# Patient Record
Sex: Female | Born: 1935
Health system: Southern US, Community
[De-identification: ages and names within clinical notes are randomized; demographics above are authoritative.]

## PROBLEM LIST (undated history)

## (undated) DIAGNOSIS — I2510 Atherosclerotic heart disease of native coronary artery without angina pectoris: Secondary | ICD-10-CM

## (undated) DIAGNOSIS — F419 Anxiety disorder, unspecified: Secondary | ICD-10-CM

## (undated) DIAGNOSIS — K219 Gastro-esophageal reflux disease without esophagitis: Secondary | ICD-10-CM

## (undated) DIAGNOSIS — I10 Essential (primary) hypertension: Secondary | ICD-10-CM

## (undated) DIAGNOSIS — E785 Hyperlipidemia, unspecified: Secondary | ICD-10-CM

## (undated) DIAGNOSIS — F329 Major depressive disorder, single episode, unspecified: Secondary | ICD-10-CM

## (undated) DIAGNOSIS — H9190 Unspecified hearing loss, unspecified ear: Secondary | ICD-10-CM

## (undated) HISTORY — DX: Hyperlipidemia, unspecified: E78.5

## (undated) HISTORY — DX: Major depressive disorder, single episode, unspecified: F32.9

## (undated) HISTORY — DX: Anxiety disorder, unspecified: F41.9

## (undated) HISTORY — PX: JOINT REPLACEMENT: SHX530

---
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
## 1977-06-08 HISTORY — PX: ABDOMINAL HYSTERECTOMY: SHX81

## 1991-12-06 DIAGNOSIS — E785 Hyperlipidemia, unspecified: Secondary | ICD-10-CM | POA: Insufficient documentation

## 1997-02-02 DIAGNOSIS — N959 Unspecified menopausal and perimenopausal disorder: Secondary | ICD-10-CM | POA: Insufficient documentation

## 1998-03-12 DIAGNOSIS — J309 Allergic rhinitis, unspecified: Secondary | ICD-10-CM | POA: Insufficient documentation

## 2000-03-08 DIAGNOSIS — D126 Benign neoplasm of colon, unspecified: Secondary | ICD-10-CM | POA: Insufficient documentation

## 2003-06-09 HISTORY — PX: BREAST EXCISIONAL BIOPSY: SUR124

## 2004-04-14 ENCOUNTER — Ambulatory Visit: Payer: Self-pay | Admitting: General Surgery

## 2005-03-09 ENCOUNTER — Ambulatory Visit: Payer: Self-pay | Admitting: Family Medicine

## 2005-05-20 ENCOUNTER — Ambulatory Visit: Payer: Self-pay

## 2005-06-29 ENCOUNTER — Ambulatory Visit: Payer: Self-pay | Admitting: Unknown Physician Specialty

## 2005-07-08 ENCOUNTER — Ambulatory Visit: Payer: Self-pay | Admitting: Family Medicine

## 2005-07-20 ENCOUNTER — Other Ambulatory Visit: Payer: Self-pay

## 2005-07-20 ENCOUNTER — Ambulatory Visit: Payer: Self-pay | Admitting: Orthopaedic Surgery

## 2005-07-28 ENCOUNTER — Ambulatory Visit: Payer: Self-pay | Admitting: Orthopaedic Surgery

## 2005-09-24 ENCOUNTER — Ambulatory Visit: Payer: Self-pay | Admitting: Family Medicine

## 2006-06-30 ENCOUNTER — Ambulatory Visit: Payer: Self-pay | Admitting: Unknown Physician Specialty

## 2006-07-19 ENCOUNTER — Ambulatory Visit: Payer: Self-pay | Admitting: Family Medicine

## 2006-07-21 DIAGNOSIS — M1990 Unspecified osteoarthritis, unspecified site: Secondary | ICD-10-CM | POA: Insufficient documentation

## 2006-07-21 DIAGNOSIS — F4320 Adjustment disorder, unspecified: Secondary | ICD-10-CM | POA: Insufficient documentation

## 2007-01-30 IMAGING — RF DG UGI W/O KUB
1 series · 15 of 20 positions shown · non-contrast
Comparison: none

REASON FOR EXAM: GERD, chest pain
COMMENTS:

[Series 1: run · 15 of 20 slices shown]
[im 1/20]
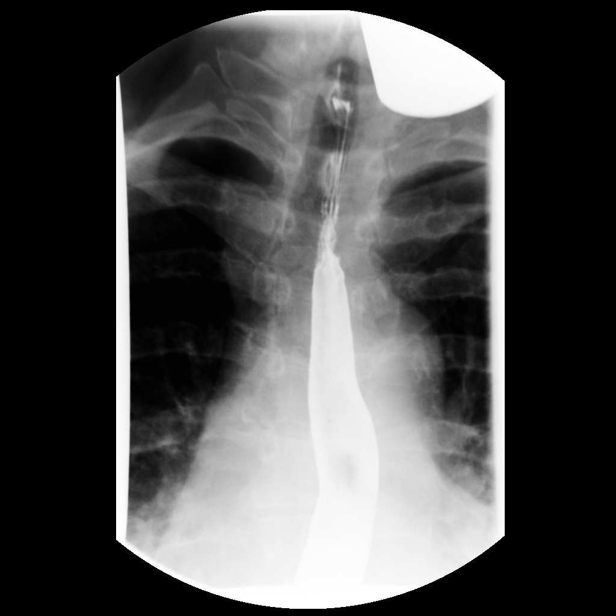
[im 3/20]
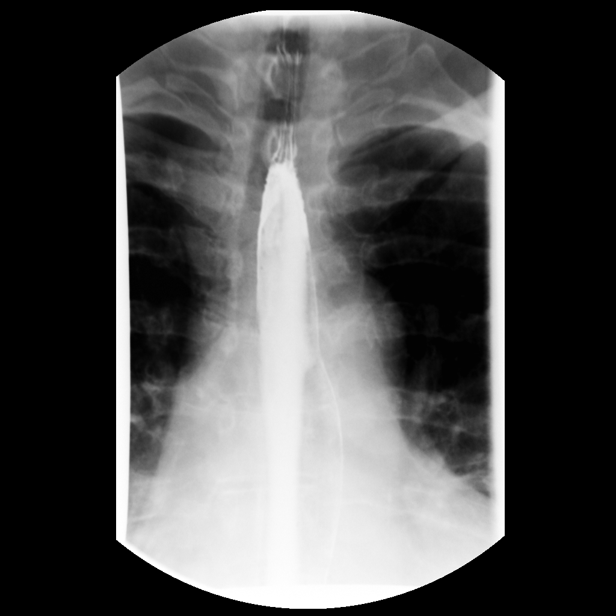
[im 4/20]
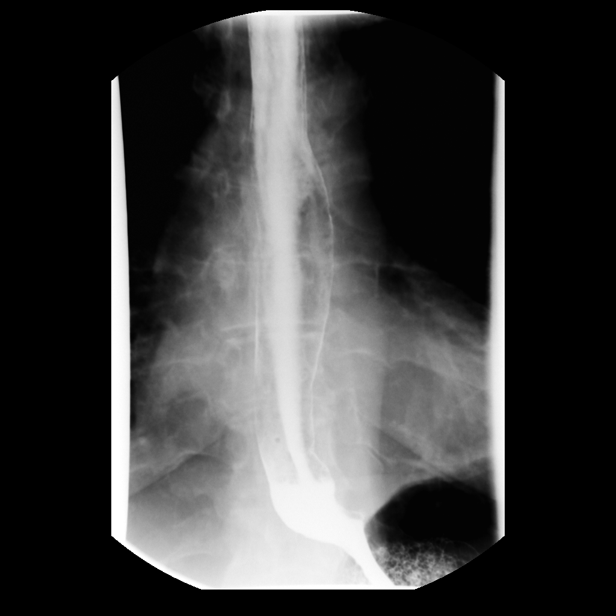
[im 5/20]
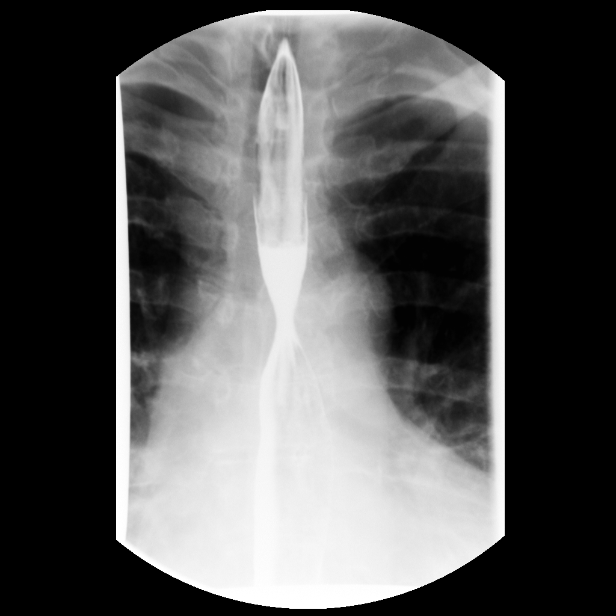
[im 7/20]
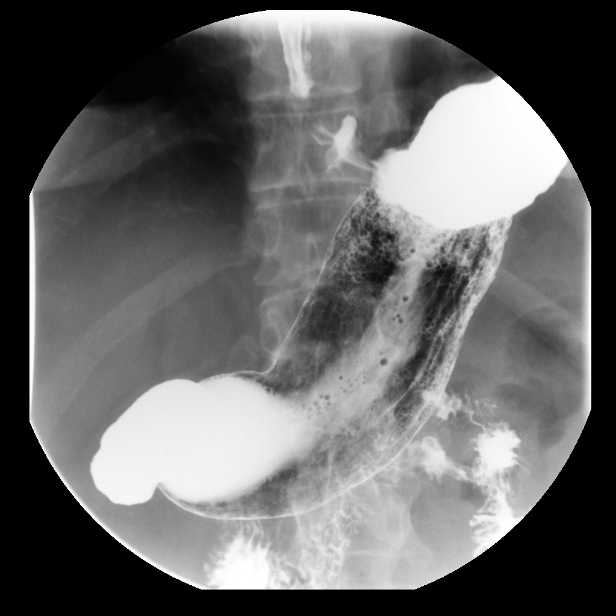
[im 8/20]
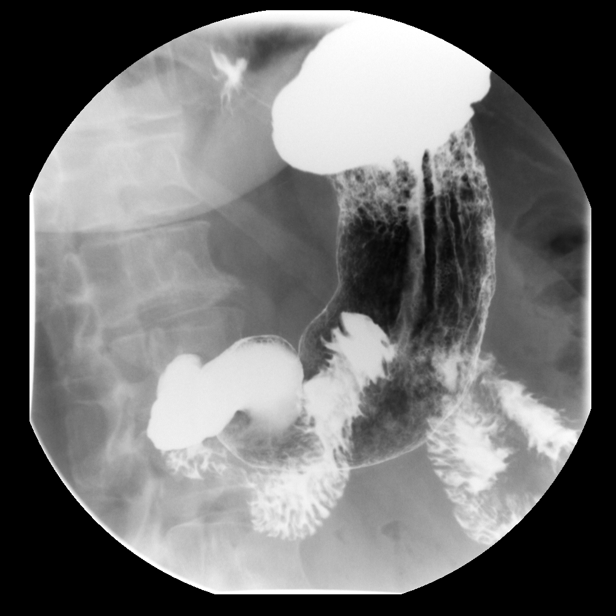
[im 9/20]
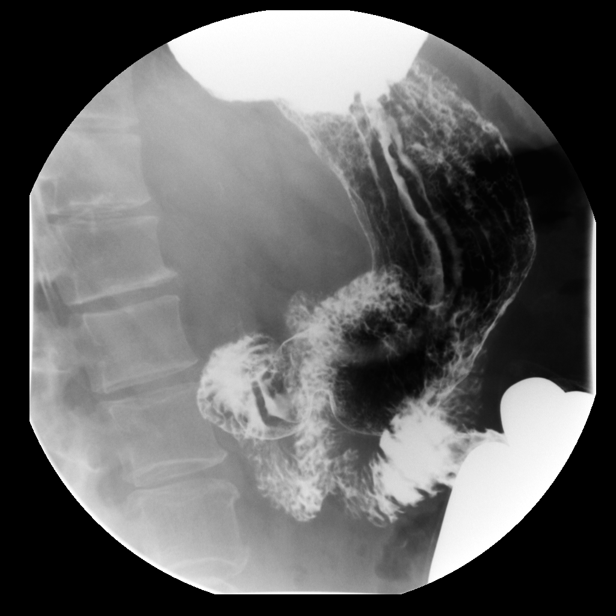
[im 11/20]
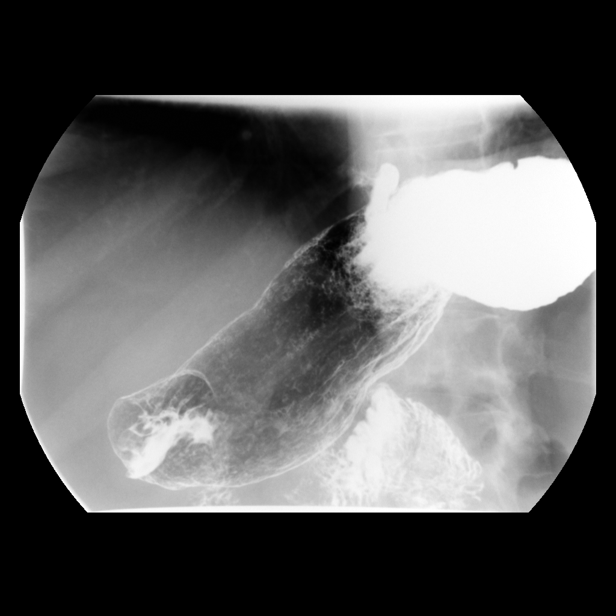
[im 12/20]
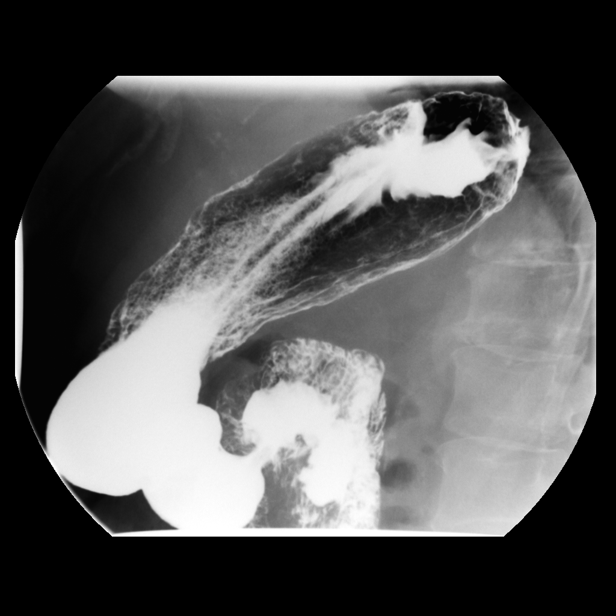
[im 13/20]
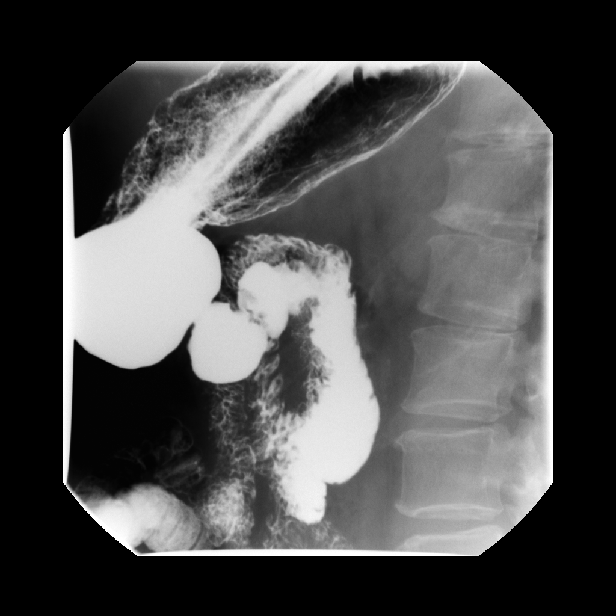
[im 15/20]
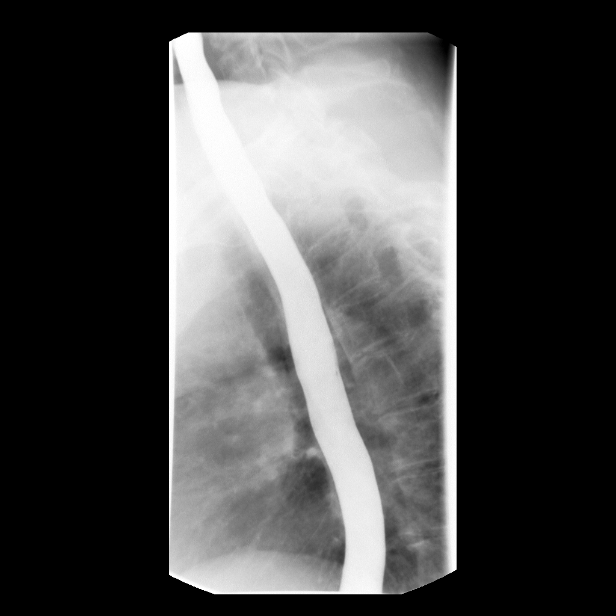
[im 16/20]
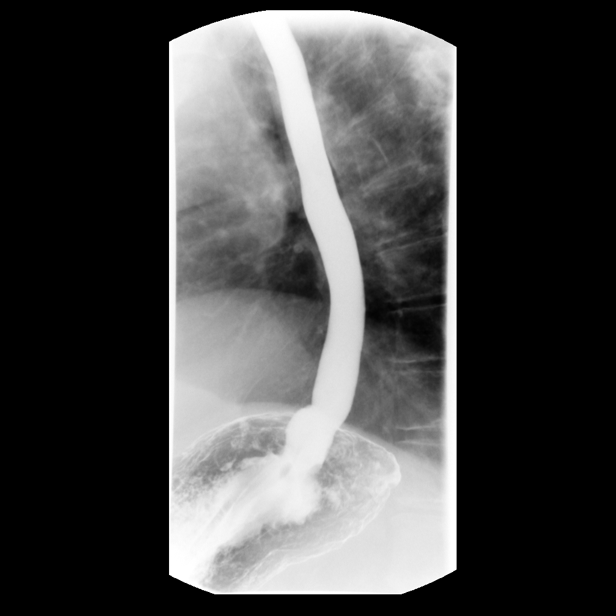
[im 17/20]
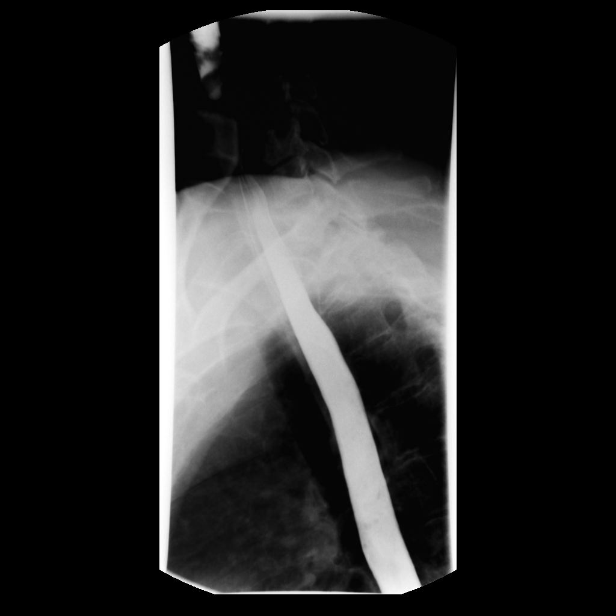
[im 19/20]
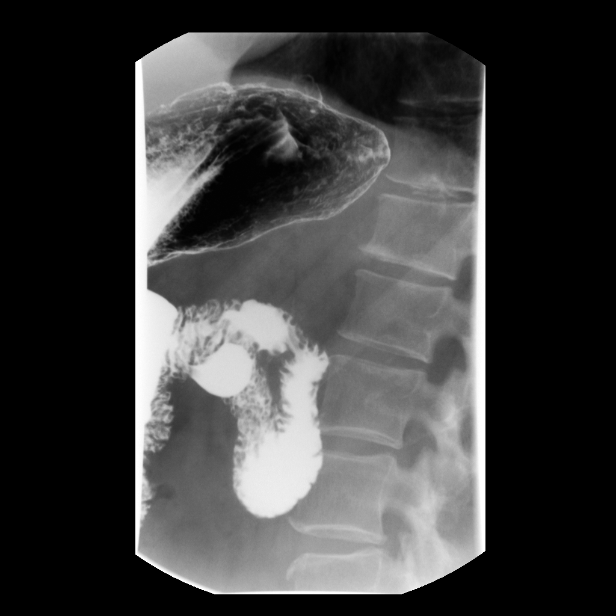
[im 20/20]
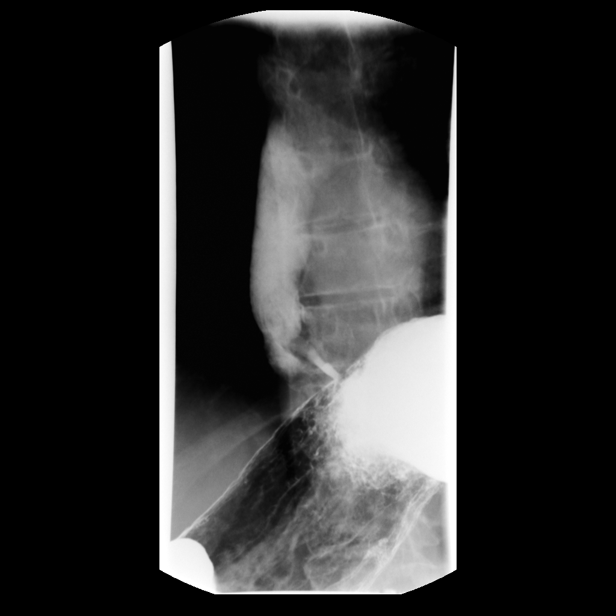

[15 of 20 positions shown; findings below may reference images not displayed]

PROCEDURE:     FL  - FL UPPER GI  - March 09, 2005  [DATE]

RESULT:     Barium passed through the esophagus unobstructed with normal
peristaltic activity. No hiatal hernia is seen. A moderate amount of
gastroesophageal reflux is elicited on the water siphon exam.

The stomach appears normal throughout its length with no ulcer craters
noted. The duodenal bulb and duodenal C-loop appear within normal limits.
IMPRESSION: 1.     Moderate amount of gastroesophageal reflux is noted without hiatal
hernia.
2.     No ulcer craters are seen.

## 2007-08-02 ENCOUNTER — Ambulatory Visit: Payer: Self-pay | Admitting: Family Medicine

## 2007-09-01 ENCOUNTER — Inpatient Hospital Stay: Payer: Self-pay | Admitting: Unknown Physician Specialty

## 2007-11-23 ENCOUNTER — Ambulatory Visit: Payer: Self-pay | Admitting: Unknown Physician Specialty

## 2008-09-26 DIAGNOSIS — R0602 Shortness of breath: Secondary | ICD-10-CM | POA: Insufficient documentation

## 2008-10-02 ENCOUNTER — Ambulatory Visit: Payer: Self-pay | Admitting: Family Medicine

## 2009-03-15 DIAGNOSIS — M791 Myalgia, unspecified site: Secondary | ICD-10-CM | POA: Insufficient documentation

## 2009-06-08 HISTORY — PX: KNEE SURGERY: SHX244

## 2009-07-29 ENCOUNTER — Ambulatory Visit: Payer: Self-pay | Admitting: General Practice

## 2009-07-29 DIAGNOSIS — N259 Disorder resulting from impaired renal tubular function, unspecified: Secondary | ICD-10-CM | POA: Insufficient documentation

## 2009-08-12 ENCOUNTER — Inpatient Hospital Stay: Payer: Self-pay | Admitting: General Practice

## 2010-09-02 ENCOUNTER — Ambulatory Visit: Payer: Self-pay | Admitting: Unknown Physician Specialty

## 2011-07-15 DIAGNOSIS — E7800 Pure hypercholesterolemia, unspecified: Secondary | ICD-10-CM | POA: Diagnosis not present

## 2011-07-15 DIAGNOSIS — R7309 Other abnormal glucose: Secondary | ICD-10-CM | POA: Diagnosis not present

## 2011-07-15 DIAGNOSIS — F329 Major depressive disorder, single episode, unspecified: Secondary | ICD-10-CM | POA: Diagnosis not present

## 2011-07-17 DIAGNOSIS — E785 Hyperlipidemia, unspecified: Secondary | ICD-10-CM | POA: Diagnosis not present

## 2011-07-17 DIAGNOSIS — R7309 Other abnormal glucose: Secondary | ICD-10-CM | POA: Diagnosis not present

## 2011-08-13 DIAGNOSIS — R0789 Other chest pain: Secondary | ICD-10-CM | POA: Diagnosis not present

## 2011-08-13 DIAGNOSIS — I10 Essential (primary) hypertension: Secondary | ICD-10-CM | POA: Diagnosis not present

## 2011-08-13 DIAGNOSIS — E782 Mixed hyperlipidemia: Secondary | ICD-10-CM | POA: Diagnosis not present

## 2011-08-13 DIAGNOSIS — R0989 Other specified symptoms and signs involving the circulatory and respiratory systems: Secondary | ICD-10-CM | POA: Diagnosis not present

## 2011-09-02 DIAGNOSIS — R413 Other amnesia: Secondary | ICD-10-CM | POA: Diagnosis not present

## 2011-09-02 DIAGNOSIS — R0989 Other specified symptoms and signs involving the circulatory and respiratory systems: Secondary | ICD-10-CM | POA: Diagnosis not present

## 2011-09-02 DIAGNOSIS — F329 Major depressive disorder, single episode, unspecified: Secondary | ICD-10-CM | POA: Diagnosis not present

## 2011-09-02 DIAGNOSIS — N3000 Acute cystitis without hematuria: Secondary | ICD-10-CM | POA: Diagnosis not present

## 2011-09-02 DIAGNOSIS — Z0000 Encounter for general adult medical examination without abnormal findings: Secondary | ICD-10-CM | POA: Diagnosis not present

## 2011-11-05 ENCOUNTER — Ambulatory Visit: Payer: Self-pay | Admitting: Family Medicine

## 2011-11-05 DIAGNOSIS — Z1231 Encounter for screening mammogram for malignant neoplasm of breast: Secondary | ICD-10-CM | POA: Diagnosis not present

## 2011-11-25 DIAGNOSIS — Z85828 Personal history of other malignant neoplasm of skin: Secondary | ICD-10-CM | POA: Diagnosis not present

## 2011-11-25 DIAGNOSIS — L570 Actinic keratosis: Secondary | ICD-10-CM | POA: Diagnosis not present

## 2011-11-25 DIAGNOSIS — D485 Neoplasm of uncertain behavior of skin: Secondary | ICD-10-CM | POA: Diagnosis not present

## 2011-11-25 DIAGNOSIS — C4441 Basal cell carcinoma of skin of scalp and neck: Secondary | ICD-10-CM | POA: Diagnosis not present

## 2011-11-25 DIAGNOSIS — L819 Disorder of pigmentation, unspecified: Secondary | ICD-10-CM | POA: Diagnosis not present

## 2011-12-02 DIAGNOSIS — E876 Hypokalemia: Secondary | ICD-10-CM | POA: Diagnosis not present

## 2011-12-02 DIAGNOSIS — F329 Major depressive disorder, single episode, unspecified: Secondary | ICD-10-CM | POA: Diagnosis not present

## 2011-12-02 DIAGNOSIS — E785 Hyperlipidemia, unspecified: Secondary | ICD-10-CM | POA: Diagnosis not present

## 2011-12-02 DIAGNOSIS — Z0000 Encounter for general adult medical examination without abnormal findings: Secondary | ICD-10-CM | POA: Diagnosis not present

## 2011-12-15 DIAGNOSIS — E785 Hyperlipidemia, unspecified: Secondary | ICD-10-CM | POA: Diagnosis not present

## 2011-12-15 DIAGNOSIS — E876 Hypokalemia: Secondary | ICD-10-CM | POA: Diagnosis not present

## 2011-12-25 DIAGNOSIS — I10 Essential (primary) hypertension: Secondary | ICD-10-CM | POA: Diagnosis not present

## 2011-12-25 DIAGNOSIS — E785 Hyperlipidemia, unspecified: Secondary | ICD-10-CM | POA: Diagnosis not present

## 2011-12-25 DIAGNOSIS — M79609 Pain in unspecified limb: Secondary | ICD-10-CM | POA: Diagnosis not present

## 2011-12-29 DIAGNOSIS — I10 Essential (primary) hypertension: Secondary | ICD-10-CM | POA: Diagnosis not present

## 2011-12-29 DIAGNOSIS — E785 Hyperlipidemia, unspecified: Secondary | ICD-10-CM | POA: Diagnosis not present

## 2011-12-29 DIAGNOSIS — M79609 Pain in unspecified limb: Secondary | ICD-10-CM | POA: Diagnosis not present

## 2011-12-29 DIAGNOSIS — M7989 Other specified soft tissue disorders: Secondary | ICD-10-CM | POA: Diagnosis not present

## 2011-12-31 DIAGNOSIS — C4441 Basal cell carcinoma of skin of scalp and neck: Secondary | ICD-10-CM | POA: Diagnosis not present

## 2012-02-09 DIAGNOSIS — I10 Essential (primary) hypertension: Secondary | ICD-10-CM | POA: Diagnosis not present

## 2012-02-09 DIAGNOSIS — R002 Palpitations: Secondary | ICD-10-CM | POA: Diagnosis not present

## 2012-02-09 DIAGNOSIS — E782 Mixed hyperlipidemia: Secondary | ICD-10-CM | POA: Diagnosis not present

## 2012-02-15 DIAGNOSIS — R002 Palpitations: Secondary | ICD-10-CM | POA: Diagnosis not present

## 2012-02-18 DIAGNOSIS — E782 Mixed hyperlipidemia: Secondary | ICD-10-CM | POA: Diagnosis not present

## 2012-02-18 DIAGNOSIS — R0789 Other chest pain: Secondary | ICD-10-CM | POA: Diagnosis not present

## 2012-02-18 DIAGNOSIS — I10 Essential (primary) hypertension: Secondary | ICD-10-CM | POA: Diagnosis not present

## 2012-02-25 DIAGNOSIS — Z23 Encounter for immunization: Secondary | ICD-10-CM | POA: Diagnosis not present

## 2012-03-04 DIAGNOSIS — R5381 Other malaise: Secondary | ICD-10-CM | POA: Diagnosis not present

## 2012-03-04 DIAGNOSIS — E785 Hyperlipidemia, unspecified: Secondary | ICD-10-CM | POA: Diagnosis not present

## 2012-03-04 DIAGNOSIS — F329 Major depressive disorder, single episode, unspecified: Secondary | ICD-10-CM | POA: Diagnosis not present

## 2012-03-04 DIAGNOSIS — Z0000 Encounter for general adult medical examination without abnormal findings: Secondary | ICD-10-CM | POA: Diagnosis not present

## 2012-03-10 DIAGNOSIS — F329 Major depressive disorder, single episode, unspecified: Secondary | ICD-10-CM | POA: Diagnosis not present

## 2012-03-10 DIAGNOSIS — R5381 Other malaise: Secondary | ICD-10-CM | POA: Diagnosis not present

## 2012-03-10 DIAGNOSIS — Z0000 Encounter for general adult medical examination without abnormal findings: Secondary | ICD-10-CM | POA: Diagnosis not present

## 2012-03-10 DIAGNOSIS — E785 Hyperlipidemia, unspecified: Secondary | ICD-10-CM | POA: Diagnosis not present

## 2012-05-25 DIAGNOSIS — F329 Major depressive disorder, single episode, unspecified: Secondary | ICD-10-CM | POA: Diagnosis not present

## 2012-06-20 DIAGNOSIS — I10 Essential (primary) hypertension: Secondary | ICD-10-CM | POA: Diagnosis not present

## 2012-06-20 DIAGNOSIS — J069 Acute upper respiratory infection, unspecified: Secondary | ICD-10-CM | POA: Diagnosis not present

## 2012-07-27 DIAGNOSIS — R05 Cough: Secondary | ICD-10-CM | POA: Diagnosis not present

## 2012-07-27 DIAGNOSIS — J309 Allergic rhinitis, unspecified: Secondary | ICD-10-CM | POA: Diagnosis not present

## 2012-07-27 DIAGNOSIS — K219 Gastro-esophageal reflux disease without esophagitis: Secondary | ICD-10-CM | POA: Diagnosis not present

## 2012-08-11 DIAGNOSIS — R0609 Other forms of dyspnea: Secondary | ICD-10-CM | POA: Diagnosis not present

## 2012-08-11 DIAGNOSIS — R0989 Other specified symptoms and signs involving the circulatory and respiratory systems: Secondary | ICD-10-CM | POA: Diagnosis not present

## 2012-08-11 DIAGNOSIS — R0789 Other chest pain: Secondary | ICD-10-CM | POA: Diagnosis not present

## 2012-08-11 DIAGNOSIS — I10 Essential (primary) hypertension: Secondary | ICD-10-CM | POA: Diagnosis not present

## 2012-08-25 DIAGNOSIS — R05 Cough: Secondary | ICD-10-CM | POA: Diagnosis not present

## 2012-08-25 DIAGNOSIS — R7309 Other abnormal glucose: Secondary | ICD-10-CM | POA: Diagnosis not present

## 2012-08-25 DIAGNOSIS — F329 Major depressive disorder, single episode, unspecified: Secondary | ICD-10-CM | POA: Diagnosis not present

## 2012-08-25 DIAGNOSIS — I10 Essential (primary) hypertension: Secondary | ICD-10-CM | POA: Diagnosis not present

## 2012-08-31 DIAGNOSIS — H4010X0 Unspecified open-angle glaucoma, stage unspecified: Secondary | ICD-10-CM | POA: Diagnosis not present

## 2012-09-02 DIAGNOSIS — R0789 Other chest pain: Secondary | ICD-10-CM | POA: Diagnosis not present

## 2012-09-02 DIAGNOSIS — R9430 Abnormal result of cardiovascular function study, unspecified: Secondary | ICD-10-CM | POA: Diagnosis not present

## 2012-09-02 DIAGNOSIS — I10 Essential (primary) hypertension: Secondary | ICD-10-CM | POA: Diagnosis not present

## 2012-09-07 ENCOUNTER — Ambulatory Visit: Payer: Self-pay | Admitting: Cardiology

## 2012-09-07 DIAGNOSIS — Z888 Allergy status to other drugs, medicaments and biological substances status: Secondary | ICD-10-CM | POA: Diagnosis not present

## 2012-09-07 DIAGNOSIS — I10 Essential (primary) hypertension: Secondary | ICD-10-CM | POA: Diagnosis not present

## 2012-09-07 DIAGNOSIS — E785 Hyperlipidemia, unspecified: Secondary | ICD-10-CM | POA: Diagnosis not present

## 2012-09-07 DIAGNOSIS — I2510 Atherosclerotic heart disease of native coronary artery without angina pectoris: Secondary | ICD-10-CM | POA: Diagnosis not present

## 2012-09-07 DIAGNOSIS — Z885 Allergy status to narcotic agent status: Secondary | ICD-10-CM | POA: Diagnosis not present

## 2012-09-07 DIAGNOSIS — Z79899 Other long term (current) drug therapy: Secondary | ICD-10-CM | POA: Diagnosis not present

## 2012-09-07 DIAGNOSIS — R0789 Other chest pain: Secondary | ICD-10-CM | POA: Diagnosis not present

## 2012-09-07 DIAGNOSIS — R079 Chest pain, unspecified: Secondary | ICD-10-CM | POA: Diagnosis not present

## 2012-09-07 DIAGNOSIS — Z8673 Personal history of transient ischemic attack (TIA), and cerebral infarction without residual deficits: Secondary | ICD-10-CM | POA: Diagnosis not present

## 2012-09-07 DIAGNOSIS — R9430 Abnormal result of cardiovascular function study, unspecified: Secondary | ICD-10-CM | POA: Diagnosis not present

## 2012-09-09 DIAGNOSIS — H4010X0 Unspecified open-angle glaucoma, stage unspecified: Secondary | ICD-10-CM | POA: Diagnosis not present

## 2012-09-14 DIAGNOSIS — I2510 Atherosclerotic heart disease of native coronary artery without angina pectoris: Secondary | ICD-10-CM | POA: Diagnosis not present

## 2012-09-14 DIAGNOSIS — I10 Essential (primary) hypertension: Secondary | ICD-10-CM | POA: Diagnosis not present

## 2012-09-20 DIAGNOSIS — I10 Essential (primary) hypertension: Secondary | ICD-10-CM | POA: Diagnosis not present

## 2012-09-27 DIAGNOSIS — H4010X0 Unspecified open-angle glaucoma, stage unspecified: Secondary | ICD-10-CM | POA: Diagnosis not present

## 2012-11-08 DIAGNOSIS — H4010X0 Unspecified open-angle glaucoma, stage unspecified: Secondary | ICD-10-CM | POA: Diagnosis not present

## 2012-11-09 ENCOUNTER — Ambulatory Visit: Payer: Self-pay | Admitting: Family Medicine

## 2012-11-09 DIAGNOSIS — Z1231 Encounter for screening mammogram for malignant neoplasm of breast: Secondary | ICD-10-CM | POA: Diagnosis not present

## 2012-11-09 DIAGNOSIS — R920 Mammographic microcalcification found on diagnostic imaging of breast: Secondary | ICD-10-CM | POA: Diagnosis not present

## 2012-11-09 LAB — HM MAMMOGRAPHY

## 2012-11-16 DIAGNOSIS — D239 Other benign neoplasm of skin, unspecified: Secondary | ICD-10-CM | POA: Diagnosis not present

## 2012-11-16 DIAGNOSIS — L819 Disorder of pigmentation, unspecified: Secondary | ICD-10-CM | POA: Diagnosis not present

## 2012-11-16 DIAGNOSIS — L739 Follicular disorder, unspecified: Secondary | ICD-10-CM | POA: Diagnosis not present

## 2012-11-16 DIAGNOSIS — D485 Neoplasm of uncertain behavior of skin: Secondary | ICD-10-CM | POA: Diagnosis not present

## 2012-11-16 DIAGNOSIS — L820 Inflamed seborrheic keratosis: Secondary | ICD-10-CM | POA: Diagnosis not present

## 2012-11-16 DIAGNOSIS — Z85828 Personal history of other malignant neoplasm of skin: Secondary | ICD-10-CM | POA: Diagnosis not present

## 2012-11-22 DIAGNOSIS — H4010X0 Unspecified open-angle glaucoma, stage unspecified: Secondary | ICD-10-CM | POA: Diagnosis not present

## 2013-01-12 ENCOUNTER — Emergency Department: Payer: Self-pay | Admitting: Emergency Medicine

## 2013-01-12 DIAGNOSIS — F411 Generalized anxiety disorder: Secondary | ICD-10-CM | POA: Diagnosis not present

## 2013-01-12 DIAGNOSIS — R110 Nausea: Secondary | ICD-10-CM | POA: Diagnosis not present

## 2013-01-12 DIAGNOSIS — I10 Essential (primary) hypertension: Secondary | ICD-10-CM | POA: Diagnosis not present

## 2013-01-12 DIAGNOSIS — K219 Gastro-esophageal reflux disease without esophagitis: Secondary | ICD-10-CM | POA: Diagnosis not present

## 2013-01-12 DIAGNOSIS — G4700 Insomnia, unspecified: Secondary | ICD-10-CM | POA: Diagnosis not present

## 2013-01-12 DIAGNOSIS — Z9889 Other specified postprocedural states: Secondary | ICD-10-CM | POA: Diagnosis not present

## 2013-01-12 DIAGNOSIS — F329 Major depressive disorder, single episode, unspecified: Secondary | ICD-10-CM | POA: Diagnosis not present

## 2013-01-12 DIAGNOSIS — E785 Hyperlipidemia, unspecified: Secondary | ICD-10-CM | POA: Diagnosis not present

## 2013-01-12 DIAGNOSIS — R0789 Other chest pain: Secondary | ICD-10-CM | POA: Diagnosis not present

## 2013-01-12 DIAGNOSIS — R079 Chest pain, unspecified: Secondary | ICD-10-CM | POA: Diagnosis not present

## 2013-01-12 LAB — COMPREHENSIVE METABOLIC PANEL
Albumin: 3.7 g/dL (ref 3.4–5.0)
Alkaline Phosphatase: 79 U/L (ref 50–136)
BUN: 20 mg/dL — ABNORMAL HIGH (ref 7–18)
Bilirubin,Total: 0.4 mg/dL (ref 0.2–1.0)
Calcium, Total: 9.7 mg/dL (ref 8.5–10.1)
Co2: 32 mmol/L (ref 21–32)
Creatinine: 0.9 mg/dL (ref 0.60–1.30)
EGFR (Non-African Amer.): >60
Glucose: 108 mg/dL — ABNORMAL HIGH (ref 65–99)
Osmolality: 284 (ref 275–301)
SGOT(AST): 29 U/L (ref 15–37)
SGPT (ALT): 35 U/L (ref 12–78)

## 2013-01-12 LAB — CBC
HCT: 39.8 % (ref 35.0–47.0)
MCHC: 36.9 g/dL — ABNORMAL HIGH (ref 32.0–36.0)
MCV: 87 fL (ref 80–100)
Platelet: 184 10*3/uL (ref 150–440)
RDW: 13.2 % (ref 11.5–14.5)
WBC: 5.1 10*3/uL (ref 3.6–11.0)

## 2013-01-12 LAB — TROPONIN I: Troponin-I: <0.02 ng/mL

## 2013-01-12 LAB — URINALYSIS, COMPLETE
Bacteria: NONE SEEN
Blood: NEGATIVE
Glucose,UR: NEGATIVE mg/dL (ref 0–75)
Nitrite: NEGATIVE
Ph: 6 (ref 4.5–8.0)
RBC,UR: 1 /HPF (ref 0–5)
Squamous Epithelial: 1

## 2013-01-12 LAB — CK TOTAL AND CKMB (NOT AT ARMC): CK-MB: 3.1 ng/mL (ref 0.5–3.6)

## 2013-01-13 DIAGNOSIS — R110 Nausea: Secondary | ICD-10-CM | POA: Diagnosis not present

## 2013-01-13 DIAGNOSIS — I2510 Atherosclerotic heart disease of native coronary artery without angina pectoris: Secondary | ICD-10-CM | POA: Diagnosis not present

## 2013-01-13 DIAGNOSIS — I10 Essential (primary) hypertension: Secondary | ICD-10-CM | POA: Diagnosis not present

## 2013-01-13 DIAGNOSIS — R0789 Other chest pain: Secondary | ICD-10-CM | POA: Diagnosis not present

## 2013-01-17 DIAGNOSIS — M5380 Other specified dorsopathies, site unspecified: Secondary | ICD-10-CM | POA: Diagnosis not present

## 2013-01-17 DIAGNOSIS — M999 Biomechanical lesion, unspecified: Secondary | ICD-10-CM | POA: Diagnosis not present

## 2013-01-17 DIAGNOSIS — M533 Sacrococcygeal disorders, not elsewhere classified: Secondary | ICD-10-CM | POA: Diagnosis not present

## 2013-01-26 DIAGNOSIS — M5380 Other specified dorsopathies, site unspecified: Secondary | ICD-10-CM | POA: Diagnosis not present

## 2013-01-26 DIAGNOSIS — M533 Sacrococcygeal disorders, not elsewhere classified: Secondary | ICD-10-CM | POA: Diagnosis not present

## 2013-01-26 DIAGNOSIS — M999 Biomechanical lesion, unspecified: Secondary | ICD-10-CM | POA: Diagnosis not present

## 2013-02-20 DIAGNOSIS — Z23 Encounter for immunization: Secondary | ICD-10-CM | POA: Diagnosis not present

## 2013-02-21 DIAGNOSIS — M533 Sacrococcygeal disorders, not elsewhere classified: Secondary | ICD-10-CM | POA: Diagnosis not present

## 2013-02-21 DIAGNOSIS — M999 Biomechanical lesion, unspecified: Secondary | ICD-10-CM | POA: Diagnosis not present

## 2013-02-21 DIAGNOSIS — M5380 Other specified dorsopathies, site unspecified: Secondary | ICD-10-CM | POA: Diagnosis not present

## 2013-04-03 DIAGNOSIS — R0609 Other forms of dyspnea: Secondary | ICD-10-CM | POA: Diagnosis not present

## 2013-04-03 DIAGNOSIS — I10 Essential (primary) hypertension: Secondary | ICD-10-CM | POA: Diagnosis not present

## 2013-04-03 DIAGNOSIS — R0789 Other chest pain: Secondary | ICD-10-CM | POA: Diagnosis not present

## 2013-04-03 DIAGNOSIS — I2510 Atherosclerotic heart disease of native coronary artery without angina pectoris: Secondary | ICD-10-CM | POA: Diagnosis not present

## 2013-06-06 DIAGNOSIS — R05 Cough: Secondary | ICD-10-CM | POA: Diagnosis not present

## 2013-06-06 DIAGNOSIS — R7309 Other abnormal glucose: Secondary | ICD-10-CM | POA: Diagnosis not present

## 2013-06-06 DIAGNOSIS — F329 Major depressive disorder, single episode, unspecified: Secondary | ICD-10-CM | POA: Diagnosis not present

## 2013-06-06 DIAGNOSIS — I10 Essential (primary) hypertension: Secondary | ICD-10-CM | POA: Diagnosis not present

## 2013-06-16 DIAGNOSIS — M5430 Sciatica, unspecified side: Secondary | ICD-10-CM | POA: Diagnosis not present

## 2013-06-16 DIAGNOSIS — IMO0002 Reserved for concepts with insufficient information to code with codable children: Secondary | ICD-10-CM | POA: Diagnosis not present

## 2013-06-28 DIAGNOSIS — L821 Other seborrheic keratosis: Secondary | ICD-10-CM | POA: Diagnosis not present

## 2013-06-28 DIAGNOSIS — Z85828 Personal history of other malignant neoplasm of skin: Secondary | ICD-10-CM | POA: Diagnosis not present

## 2013-06-28 DIAGNOSIS — L819 Disorder of pigmentation, unspecified: Secondary | ICD-10-CM | POA: Diagnosis not present

## 2013-06-28 DIAGNOSIS — L738 Other specified follicular disorders: Secondary | ICD-10-CM | POA: Diagnosis not present

## 2013-07-21 DIAGNOSIS — R209 Unspecified disturbances of skin sensation: Secondary | ICD-10-CM | POA: Diagnosis not present

## 2013-07-21 DIAGNOSIS — I10 Essential (primary) hypertension: Secondary | ICD-10-CM | POA: Diagnosis not present

## 2013-07-21 DIAGNOSIS — R252 Cramp and spasm: Secondary | ICD-10-CM | POA: Diagnosis not present

## 2013-07-21 DIAGNOSIS — R7309 Other abnormal glucose: Secondary | ICD-10-CM | POA: Diagnosis not present

## 2013-08-08 DIAGNOSIS — R7309 Other abnormal glucose: Secondary | ICD-10-CM | POA: Diagnosis not present

## 2013-08-08 DIAGNOSIS — I739 Peripheral vascular disease, unspecified: Secondary | ICD-10-CM | POA: Diagnosis not present

## 2013-08-08 DIAGNOSIS — R209 Unspecified disturbances of skin sensation: Secondary | ICD-10-CM | POA: Diagnosis not present

## 2013-08-08 DIAGNOSIS — I10 Essential (primary) hypertension: Secondary | ICD-10-CM | POA: Diagnosis not present

## 2013-09-29 DIAGNOSIS — H4010X0 Unspecified open-angle glaucoma, stage unspecified: Secondary | ICD-10-CM | POA: Diagnosis not present

## 2013-10-03 DIAGNOSIS — I10 Essential (primary) hypertension: Secondary | ICD-10-CM | POA: Diagnosis not present

## 2013-10-03 DIAGNOSIS — I2510 Atherosclerotic heart disease of native coronary artery without angina pectoris: Secondary | ICD-10-CM | POA: Diagnosis not present

## 2013-10-04 DIAGNOSIS — H4010X0 Unspecified open-angle glaucoma, stage unspecified: Secondary | ICD-10-CM | POA: Diagnosis not present

## 2013-10-04 DIAGNOSIS — H353290 Exudative age-related macular degeneration, unspecified eye, stage unspecified: Secondary | ICD-10-CM | POA: Diagnosis not present

## 2013-11-15 DIAGNOSIS — H353290 Exudative age-related macular degeneration, unspecified eye, stage unspecified: Secondary | ICD-10-CM | POA: Diagnosis not present

## 2013-12-25 DIAGNOSIS — H353290 Exudative age-related macular degeneration, unspecified eye, stage unspecified: Secondary | ICD-10-CM | POA: Diagnosis not present

## 2014-01-18 DIAGNOSIS — H353290 Exudative age-related macular degeneration, unspecified eye, stage unspecified: Secondary | ICD-10-CM | POA: Diagnosis not present

## 2014-01-24 DIAGNOSIS — Z23 Encounter for immunization: Secondary | ICD-10-CM | POA: Diagnosis not present

## 2014-02-09 DIAGNOSIS — Z8719 Personal history of other diseases of the digestive system: Secondary | ICD-10-CM | POA: Diagnosis not present

## 2014-02-09 DIAGNOSIS — K624 Stenosis of anus and rectum: Secondary | ICD-10-CM | POA: Diagnosis not present

## 2014-02-09 DIAGNOSIS — Z86010 Personal history of colonic polyps: Secondary | ICD-10-CM | POA: Diagnosis not present

## 2014-02-16 DIAGNOSIS — Z86010 Personal history of colonic polyps: Secondary | ICD-10-CM | POA: Diagnosis not present

## 2014-02-16 DIAGNOSIS — Z8719 Personal history of other diseases of the digestive system: Secondary | ICD-10-CM | POA: Diagnosis not present

## 2014-02-27 DIAGNOSIS — Z23 Encounter for immunization: Secondary | ICD-10-CM | POA: Diagnosis not present

## 2014-03-08 ENCOUNTER — Ambulatory Visit: Payer: Self-pay | Admitting: Unknown Physician Specialty

## 2014-03-08 DIAGNOSIS — Z86010 Personal history of colonic polyps: Secondary | ICD-10-CM | POA: Diagnosis not present

## 2014-03-08 DIAGNOSIS — K921 Melena: Secondary | ICD-10-CM | POA: Diagnosis not present

## 2014-03-08 DIAGNOSIS — Z09 Encounter for follow-up examination after completed treatment for conditions other than malignant neoplasm: Secondary | ICD-10-CM | POA: Diagnosis not present

## 2014-03-08 DIAGNOSIS — D123 Benign neoplasm of transverse colon: Secondary | ICD-10-CM | POA: Diagnosis not present

## 2014-03-08 DIAGNOSIS — D122 Benign neoplasm of ascending colon: Secondary | ICD-10-CM | POA: Diagnosis not present

## 2014-03-08 DIAGNOSIS — I10 Essential (primary) hypertension: Secondary | ICD-10-CM | POA: Diagnosis not present

## 2014-03-08 DIAGNOSIS — F329 Major depressive disorder, single episode, unspecified: Secondary | ICD-10-CM | POA: Diagnosis not present

## 2014-03-08 DIAGNOSIS — K2950 Unspecified chronic gastritis without bleeding: Secondary | ICD-10-CM | POA: Diagnosis not present

## 2014-03-08 DIAGNOSIS — K6389 Other specified diseases of intestine: Secondary | ICD-10-CM | POA: Diagnosis not present

## 2014-03-08 DIAGNOSIS — K3189 Other diseases of stomach and duodenum: Secondary | ICD-10-CM | POA: Diagnosis not present

## 2014-03-08 DIAGNOSIS — D139 Benign neoplasm of ill-defined sites within the digestive system: Secondary | ICD-10-CM | POA: Diagnosis not present

## 2014-03-08 DIAGNOSIS — K2940 Chronic atrophic gastritis without bleeding: Secondary | ICD-10-CM | POA: Diagnosis not present

## 2014-03-08 DIAGNOSIS — K2970 Gastritis, unspecified, without bleeding: Secondary | ICD-10-CM | POA: Diagnosis not present

## 2014-03-08 DIAGNOSIS — Z1211 Encounter for screening for malignant neoplasm of colon: Secondary | ICD-10-CM | POA: Diagnosis not present

## 2014-03-08 DIAGNOSIS — M1990 Unspecified osteoarthritis, unspecified site: Secondary | ICD-10-CM | POA: Diagnosis not present

## 2014-03-08 DIAGNOSIS — Z79899 Other long term (current) drug therapy: Secondary | ICD-10-CM | POA: Diagnosis not present

## 2014-03-08 DIAGNOSIS — D120 Benign neoplasm of cecum: Secondary | ICD-10-CM | POA: Diagnosis not present

## 2014-03-08 DIAGNOSIS — Z9889 Other specified postprocedural states: Secondary | ICD-10-CM | POA: Insufficient documentation

## 2014-03-08 DIAGNOSIS — Z96659 Presence of unspecified artificial knee joint: Secondary | ICD-10-CM | POA: Diagnosis not present

## 2014-03-08 DIAGNOSIS — D125 Benign neoplasm of sigmoid colon: Secondary | ICD-10-CM | POA: Diagnosis not present

## 2014-03-08 DIAGNOSIS — K449 Diaphragmatic hernia without obstruction or gangrene: Secondary | ICD-10-CM | POA: Diagnosis not present

## 2014-03-08 DIAGNOSIS — D124 Benign neoplasm of descending colon: Secondary | ICD-10-CM | POA: Diagnosis not present

## 2014-03-08 LAB — HM COLONOSCOPY

## 2014-03-12 DIAGNOSIS — Z9889 Other specified postprocedural states: Secondary | ICD-10-CM | POA: Diagnosis not present

## 2014-03-12 DIAGNOSIS — E780 Pure hypercholesterolemia: Secondary | ICD-10-CM | POA: Diagnosis not present

## 2014-03-12 DIAGNOSIS — I10 Essential (primary) hypertension: Secondary | ICD-10-CM | POA: Diagnosis not present

## 2014-03-13 LAB — PATHOLOGY REPORT

## 2014-03-27 DIAGNOSIS — H3532 Exudative age-related macular degeneration: Secondary | ICD-10-CM | POA: Diagnosis not present

## 2014-04-06 DIAGNOSIS — H3532 Exudative age-related macular degeneration: Secondary | ICD-10-CM | POA: Diagnosis not present

## 2014-04-10 DIAGNOSIS — H4010X1 Unspecified open-angle glaucoma, mild stage: Secondary | ICD-10-CM | POA: Diagnosis not present

## 2014-04-11 DIAGNOSIS — H3532 Exudative age-related macular degeneration: Secondary | ICD-10-CM | POA: Diagnosis not present

## 2014-04-26 DIAGNOSIS — H4010X1 Unspecified open-angle glaucoma, mild stage: Secondary | ICD-10-CM | POA: Diagnosis not present

## 2014-05-20 LAB — CBC AND DIFFERENTIAL
HCT: 41 % (ref 36–46)
HEMOGLOBIN: 14.6 g/dL (ref 12.0–16.0)
Platelets: 225 10*3/uL (ref 150–399)
WBC: 3.9 10^3/mL

## 2014-05-20 LAB — BASIC METABOLIC PANEL
BUN: 22 mg/dL — AB (ref 4–21)
Creatinine: 0.9 mg/dL (ref 0.5–1.1)
Glucose: 80 mg/dL
POTASSIUM: 4 mmol/L (ref 3.4–5.3)
SODIUM: 141 mmol/L (ref 137–147)

## 2014-05-20 LAB — HEPATIC FUNCTION PANEL
ALT: 20 U/L (ref 7–35)
AST: 22 U/L (ref 13–35)

## 2014-05-30 DIAGNOSIS — G4700 Insomnia, unspecified: Secondary | ICD-10-CM | POA: Diagnosis not present

## 2014-05-30 DIAGNOSIS — R739 Hyperglycemia, unspecified: Secondary | ICD-10-CM | POA: Diagnosis not present

## 2014-05-30 DIAGNOSIS — Z1389 Encounter for screening for other disorder: Secondary | ICD-10-CM | POA: Diagnosis not present

## 2014-05-30 DIAGNOSIS — Z9181 History of falling: Secondary | ICD-10-CM | POA: Diagnosis not present

## 2014-05-30 DIAGNOSIS — I10 Essential (primary) hypertension: Secondary | ICD-10-CM | POA: Diagnosis not present

## 2014-05-30 LAB — HEMOGLOBIN A1C: HEMOGLOBIN A1C: 5.5 % (ref 4.0–6.0)

## 2014-06-02 ENCOUNTER — Emergency Department: Payer: Self-pay | Admitting: Emergency Medicine

## 2014-06-02 DIAGNOSIS — Z79899 Other long term (current) drug therapy: Secondary | ICD-10-CM | POA: Diagnosis not present

## 2014-06-02 DIAGNOSIS — S299XXA Unspecified injury of thorax, initial encounter: Secondary | ICD-10-CM | POA: Diagnosis not present

## 2014-06-02 DIAGNOSIS — S60512A Abrasion of left hand, initial encounter: Secondary | ICD-10-CM | POA: Diagnosis not present

## 2014-06-02 DIAGNOSIS — R079 Chest pain, unspecified: Secondary | ICD-10-CM | POA: Diagnosis not present

## 2014-06-02 DIAGNOSIS — S20219A Contusion of unspecified front wall of thorax, initial encounter: Secondary | ICD-10-CM | POA: Diagnosis not present

## 2014-06-02 DIAGNOSIS — E876 Hypokalemia: Secondary | ICD-10-CM | POA: Diagnosis not present

## 2014-06-02 DIAGNOSIS — S20212A Contusion of left front wall of thorax, initial encounter: Secondary | ICD-10-CM | POA: Diagnosis not present

## 2014-06-02 DIAGNOSIS — I10 Essential (primary) hypertension: Secondary | ICD-10-CM | POA: Diagnosis not present

## 2014-06-02 DIAGNOSIS — S60511A Abrasion of right hand, initial encounter: Secondary | ICD-10-CM | POA: Diagnosis not present

## 2014-06-02 LAB — BASIC METABOLIC PANEL
ANION GAP: 8 (ref 7–16)
BUN: 20 mg/dL — AB (ref 7–18)
CALCIUM: 8.4 mg/dL — AB (ref 8.5–10.1)
CHLORIDE: 105 mmol/L (ref 98–107)
CO2: 29 mmol/L (ref 21–32)
CREATININE: 0.97 mg/dL (ref 0.60–1.30)
EGFR (Non-African Amer.): 59 — ABNORMAL LOW
GLUCOSE: 133 mg/dL — AB (ref 65–99)
OSMOLALITY: 288 (ref 275–301)
Potassium: 2.8 mmol/L — ABNORMAL LOW (ref 3.5–5.1)
Sodium: 142 mmol/L (ref 136–145)

## 2014-06-02 LAB — CBC
HCT: 39.8 % (ref 35.0–47.0)
HGB: 13.8 g/dL (ref 12.0–16.0)
MCH: 31.3 pg (ref 26.0–34.0)
MCHC: 34.8 g/dL (ref 32.0–36.0)
MCV: 90 fL (ref 80–100)
Platelet: 183 10*3/uL (ref 150–440)
RBC: 4.42 10*6/uL (ref 3.80–5.20)
RDW: 13.5 % (ref 11.5–14.5)
WBC: 5.7 10*3/uL (ref 3.6–11.0)

## 2014-06-02 LAB — TROPONIN I

## 2014-07-25 DIAGNOSIS — H1013 Acute atopic conjunctivitis, bilateral: Secondary | ICD-10-CM | POA: Diagnosis not present

## 2014-07-25 DIAGNOSIS — H3532 Exudative age-related macular degeneration: Secondary | ICD-10-CM | POA: Diagnosis not present

## 2014-09-05 DIAGNOSIS — H3532 Exudative age-related macular degeneration: Secondary | ICD-10-CM | POA: Diagnosis not present

## 2014-09-18 DIAGNOSIS — H4011X1 Primary open-angle glaucoma, mild stage: Secondary | ICD-10-CM | POA: Diagnosis not present

## 2014-09-20 DIAGNOSIS — R079 Chest pain, unspecified: Secondary | ICD-10-CM | POA: Insufficient documentation

## 2014-09-20 DIAGNOSIS — E780 Pure hypercholesterolemia: Secondary | ICD-10-CM | POA: Diagnosis not present

## 2014-09-20 DIAGNOSIS — I10 Essential (primary) hypertension: Secondary | ICD-10-CM | POA: Diagnosis not present

## 2014-09-20 DIAGNOSIS — Z9889 Other specified postprocedural states: Secondary | ICD-10-CM | POA: Diagnosis not present

## 2014-09-26 DIAGNOSIS — R079 Chest pain, unspecified: Secondary | ICD-10-CM | POA: Diagnosis not present

## 2014-09-26 DIAGNOSIS — R0602 Shortness of breath: Secondary | ICD-10-CM | POA: Diagnosis not present

## 2014-10-04 DIAGNOSIS — R0602 Shortness of breath: Secondary | ICD-10-CM | POA: Diagnosis not present

## 2014-10-04 DIAGNOSIS — E780 Pure hypercholesterolemia: Secondary | ICD-10-CM | POA: Diagnosis not present

## 2014-10-04 DIAGNOSIS — I10 Essential (primary) hypertension: Secondary | ICD-10-CM | POA: Diagnosis not present

## 2014-10-04 DIAGNOSIS — Z9889 Other specified postprocedural states: Secondary | ICD-10-CM | POA: Diagnosis not present

## 2014-10-05 DIAGNOSIS — H4011X1 Primary open-angle glaucoma, mild stage: Secondary | ICD-10-CM | POA: Diagnosis not present

## 2014-10-17 DIAGNOSIS — H3532 Exudative age-related macular degeneration: Secondary | ICD-10-CM | POA: Diagnosis not present

## 2014-10-19 DIAGNOSIS — H4011X1 Primary open-angle glaucoma, mild stage: Secondary | ICD-10-CM | POA: Diagnosis not present

## 2014-10-26 DIAGNOSIS — H4011X1 Primary open-angle glaucoma, mild stage: Secondary | ICD-10-CM | POA: Diagnosis not present

## 2014-11-09 DIAGNOSIS — H4011X1 Primary open-angle glaucoma, mild stage: Secondary | ICD-10-CM | POA: Diagnosis not present

## 2014-11-19 DIAGNOSIS — R0781 Pleurodynia: Secondary | ICD-10-CM | POA: Diagnosis not present

## 2014-11-19 DIAGNOSIS — W19XXXA Unspecified fall, initial encounter: Secondary | ICD-10-CM | POA: Diagnosis not present

## 2014-11-23 DIAGNOSIS — H4011X1 Primary open-angle glaucoma, mild stage: Secondary | ICD-10-CM | POA: Diagnosis not present

## 2014-11-28 DIAGNOSIS — H3532 Exudative age-related macular degeneration: Secondary | ICD-10-CM | POA: Diagnosis not present

## 2014-11-30 DIAGNOSIS — H04129 Dry eye syndrome of unspecified lacrimal gland: Secondary | ICD-10-CM | POA: Diagnosis not present

## 2015-01-04 DIAGNOSIS — H4011X1 Primary open-angle glaucoma, mild stage: Secondary | ICD-10-CM | POA: Diagnosis not present

## 2015-01-05 ENCOUNTER — Other Ambulatory Visit: Payer: Self-pay | Admitting: Family Medicine

## 2015-01-05 DIAGNOSIS — F419 Anxiety disorder, unspecified: Secondary | ICD-10-CM

## 2015-01-05 DIAGNOSIS — I10 Essential (primary) hypertension: Secondary | ICD-10-CM

## 2015-01-07 ENCOUNTER — Other Ambulatory Visit: Payer: Self-pay | Admitting: Family Medicine

## 2015-01-07 DIAGNOSIS — F419 Anxiety disorder, unspecified: Secondary | ICD-10-CM | POA: Insufficient documentation

## 2015-01-07 DIAGNOSIS — I10 Essential (primary) hypertension: Secondary | ICD-10-CM | POA: Insufficient documentation

## 2015-01-07 NOTE — Telephone Encounter (Signed)
Pt sates she will need this refill today if possible.  Pt states she is going out of town/MW

## 2015-01-07 NOTE — Telephone Encounter (Signed)
Please call in rx for Xanax. Thanks.

## 2015-01-07 NOTE — Telephone Encounter (Signed)
Last OV 05/2014 

## 2015-01-07 NOTE — Telephone Encounter (Signed)
Last OV 05/2014  Thanks,   -Mickel Baas

## 2015-01-25 DIAGNOSIS — H3532 Exudative age-related macular degeneration: Secondary | ICD-10-CM | POA: Diagnosis not present

## 2015-02-12 ENCOUNTER — Other Ambulatory Visit: Payer: Self-pay | Admitting: Family Medicine

## 2015-03-04 DIAGNOSIS — Z23 Encounter for immunization: Secondary | ICD-10-CM | POA: Diagnosis not present

## 2015-03-07 DIAGNOSIS — R079 Chest pain, unspecified: Secondary | ICD-10-CM | POA: Diagnosis not present

## 2015-03-07 DIAGNOSIS — E780 Pure hypercholesterolemia: Secondary | ICD-10-CM | POA: Diagnosis not present

## 2015-03-07 DIAGNOSIS — R0602 Shortness of breath: Secondary | ICD-10-CM | POA: Diagnosis not present

## 2015-03-07 DIAGNOSIS — Z9889 Other specified postprocedural states: Secondary | ICD-10-CM | POA: Diagnosis not present

## 2015-03-07 DIAGNOSIS — I10 Essential (primary) hypertension: Secondary | ICD-10-CM | POA: Diagnosis not present

## 2015-03-18 DIAGNOSIS — H353212 Exudative age-related macular degeneration, right eye, with inactive choroidal neovascularization: Secondary | ICD-10-CM | POA: Diagnosis not present

## 2015-03-27 DIAGNOSIS — H01021 Squamous blepharitis right upper eyelid: Secondary | ICD-10-CM | POA: Diagnosis not present

## 2015-04-03 DIAGNOSIS — H401131 Primary open-angle glaucoma, bilateral, mild stage: Secondary | ICD-10-CM | POA: Diagnosis not present

## 2015-04-18 ENCOUNTER — Other Ambulatory Visit: Payer: Self-pay

## 2015-04-18 ENCOUNTER — Encounter: Payer: Self-pay | Admitting: Family Medicine

## 2015-04-18 ENCOUNTER — Ambulatory Visit (INDEPENDENT_AMBULATORY_CARE_PROVIDER_SITE_OTHER): Payer: Medicare Other | Admitting: Family Medicine

## 2015-04-18 VITALS — BP 136/78 | HR 104 | Temp 98.0°F | Resp 20 | Ht 64.0 in | Wt 161.0 lb

## 2015-04-18 DIAGNOSIS — E876 Hypokalemia: Secondary | ICD-10-CM | POA: Insufficient documentation

## 2015-04-18 DIAGNOSIS — R0989 Other specified symptoms and signs involving the circulatory and respiratory systems: Secondary | ICD-10-CM | POA: Insufficient documentation

## 2015-04-18 DIAGNOSIS — N3090 Cystitis, unspecified without hematuria: Secondary | ICD-10-CM | POA: Diagnosis not present

## 2015-04-18 DIAGNOSIS — R6889 Other general symptoms and signs: Secondary | ICD-10-CM | POA: Insufficient documentation

## 2015-04-18 DIAGNOSIS — R739 Hyperglycemia, unspecified: Secondary | ICD-10-CM | POA: Insufficient documentation

## 2015-04-18 DIAGNOSIS — Z23 Encounter for immunization: Secondary | ICD-10-CM | POA: Diagnosis not present

## 2015-04-18 DIAGNOSIS — R252 Cramp and spasm: Secondary | ICD-10-CM | POA: Insufficient documentation

## 2015-04-18 DIAGNOSIS — R5383 Other fatigue: Secondary | ICD-10-CM

## 2015-04-18 DIAGNOSIS — F32A Depression, unspecified: Secondary | ICD-10-CM | POA: Insufficient documentation

## 2015-04-18 DIAGNOSIS — I10 Essential (primary) hypertension: Secondary | ICD-10-CM | POA: Diagnosis not present

## 2015-04-18 DIAGNOSIS — R202 Paresthesia of skin: Secondary | ICD-10-CM | POA: Insufficient documentation

## 2015-04-18 DIAGNOSIS — E785 Hyperlipidemia, unspecified: Secondary | ICD-10-CM | POA: Diagnosis not present

## 2015-04-18 DIAGNOSIS — K2100 Gastro-esophageal reflux disease with esophagitis, without bleeding: Secondary | ICD-10-CM | POA: Insufficient documentation

## 2015-04-18 DIAGNOSIS — K635 Polyp of colon: Secondary | ICD-10-CM | POA: Insufficient documentation

## 2015-04-18 DIAGNOSIS — F329 Major depressive disorder, single episode, unspecified: Secondary | ICD-10-CM | POA: Insufficient documentation

## 2015-04-18 DIAGNOSIS — M545 Low back pain, unspecified: Secondary | ICD-10-CM | POA: Insufficient documentation

## 2015-04-18 LAB — POCT URINALYSIS DIPSTICK
BILIRUBIN UA: NEGATIVE
Glucose, UA: NEGATIVE
KETONES UA: NEGATIVE
Nitrite, UA: NEGATIVE
PH UA: 6
SPEC GRAV UA: 1.025
Urobilinogen, UA: 0.2

## 2015-04-18 NOTE — Progress Notes (Signed)
Subjective:    Patient ID: Lizamarie Kevorkian Sarkis, female    DOB: 07-18-1933, 81 y.o.   MRN: [id number]  HPI  Fatigue: Patient complains of fatigue. Symptoms began several months ago. Sentinal symptom the patient feels fatigue began with: cold intolerance, constipation. Symptoms of her fatigue have been diffuse soft tissue aches and pains and general malaise. Patient describes the following psychologic symptoms: none.  Patient denies fever and GI blood loss. Symptoms have been unchanged. Severity has been moderate. Previous visits for this problem: none.   Polydipsia Pt c/o constant thist, alongside fatigue. Pt believes she Maiers have BS problems, and is requesting lab work. Last A1C was 05/30/2014 and was 5.5%. Urine has not changed. No pain. No frequency.    Needs labs redone today.    Review of Systems  Constitutional: Positive for chills, diaphoresis and fatigue. Negative for fever, activity change, appetite change and unexpected weight change.  Respiratory: Positive for shortness of breath. Negative for cough and wheezing.   Cardiovascular: Positive for chest pain. Negative for palpitations and leg swelling.  Gastrointestinal: Positive for abdominal pain and constipation. Negative for diarrhea.  Endocrine: Positive for cold intolerance, polydipsia and polyuria. Negative for heat intolerance and polyphagia.  Genitourinary: Positive for urgency, frequency and enuresis.       Malodorous urine is present  Psychiatric/Behavioral: Negative.    BP 136/78 mmHg  Pulse 104  Temp(Src) 98 F (36.7 C) (Oral)  Resp 20  Ht 5\' 4"  (1.626 m)  Wt 161 lb (73.029 kg)  BMI 27.62 kg/m2   Patient Active Problem List   Diagnosis Date Noted  . Mechanical and motor problems with internal organs 04/18/2015  . Cramp in limb 04/18/2015  . Abnormal foot pulse 04/18/2015  . Clinical depression 04/18/2015  . Fatigue 04/18/2015  . Blood glucose elevated 04/18/2015  . Decreased potassium in the blood 04/18/2015  .  LBP (low back pain) 04/18/2015  . Burning or prickling sensation 04/18/2015  . Esophagitis, reflux 04/18/2015  . Colon polyp 04/18/2015  . Hypertension 01/07/2015  . Anxiety 01/07/2015  . Chest pain 09/20/2014  . History of cardiac catheterization 03/08/2014  . Disorder resulting from impaired renal function 07/29/2009  . Muscle ache 03/15/2009  . Breath shortness 09/26/2008  . Adaptation reaction 07/21/2006  . Arthritis, degenerative 07/21/2006  . 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 Essential (primary) hypertension 04/16/1998  . Allergic rhinitis 03/12/1998  . Cannot sleep 03/04/1998  . Menopausal and perimenopausal disorder 02/02/1997  . HLD (hyperlipidemia) 12/06/1991   No past medical history on file. Current Outpatient Prescriptions on File Prior to Visit  Medication Sig  . ALPRAZolam (XANAX) 0.5 MG tablet TAKE ONE TABLET BY MOUTH TWICE DAILY  . potassium chloride SA (K-DUR,KLOR-CON) 20 MEQ tablet TAKE ONE TABLET EVERY DAY  . sertraline (ZOLOFT) 100 MG tablet 2 TABLET BY MOUTH EVERY DAY  . triamterene-hydrochlorothiazide (MAXZIDE-25) 37.5-25 MG per tablet TAKE ONE TABLET BY MOUTH EVERY DAY   No current facility-administered medications on file prior to visit.   Allergies  Allergen Reactions  . Codeine     nausea  . No Known Allergies    Past Surgical History  Procedure Laterality Date  . Knee surgery Left 2011    2007  . Abdominal hysterectomy  1979  . Tonsillectomy  1940    ADENOIDS   Social History   Social History  . Marital Status: Widowed    Spouse Name: N/A  . Number of Children: N/A  . Years of  Education: N/A   Occupational History  . Not on file.   Social History Main Topics  . Smoking status: Never Smoker   . Smokeless tobacco: Never Used  . Alcohol Use: Yes     Comment: OCCASIONAL  . Drug Use: No  . Sexual Activity: Not on file   Other Topics Concern  . Not on file   Social History Narrative   Family History  Problem  Relation Age of Onset  . Cancer Mother   . Heart disease Father   . Cancer Brother       Objective:   Physical Exam  Constitutional: She is oriented to person, place, and time. She appears well-developed and well-nourished.  Cardiovascular: Normal rate and regular rhythm.   Pulmonary/Chest: Effort normal and breath sounds normal.  Neurological: She is alert and oriented to person, place, and time.  Psychiatric: She has a normal mood and affect. Her behavior is normal. Judgment and thought content normal.   BP 136/78 mmHg  Pulse 104  Temp(Src) 98 F (36.7 C) (Oral)  Resp 20  Ht 5\' 4"  (1.626 m)  Wt 161 lb (73.029 kg)  BMI 27.62 kg/m2     Assessment & Plan:  1. Other fatigue Will check labs.  - TSH - CBC with Differential/Platelet  2. Cystitis Will send labs for culture.   - POCT urinalysis dipstick - Urine culture Results for orders placed or performed in visit on 04/18/15  POCT urinalysis dipstick  Result Value Ref Range   Color, UA yellow    Clarity, UA clear    Glucose, UA neg    Bilirubin, UA neg    Ketones, UA neg    Spec Grav, UA 1.025    Blood, UA NH trace    pH, UA 6.0    Protein, UA trace    Urobilinogen, UA 0.2    Nitrite, UA neg    Leukocytes, UA small (1+) (A) Negative    3. HLD (hyperlipidemia) Will check labs.   - Lipid panel  4. Blood glucose elevated Will check labs.   - Hemoglobin A1c  5. Essential (primary) hypertension Condition is stable. Please continue current medication and  plan of care as noted.   - Comprehensive metabolic panel  Margarita Rana, MD

## 2015-04-19 DIAGNOSIS — E785 Hyperlipidemia, unspecified: Secondary | ICD-10-CM | POA: Diagnosis not present

## 2015-04-19 DIAGNOSIS — R739 Hyperglycemia, unspecified: Secondary | ICD-10-CM | POA: Diagnosis not present

## 2015-04-19 DIAGNOSIS — I10 Essential (primary) hypertension: Secondary | ICD-10-CM | POA: Diagnosis not present

## 2015-04-19 DIAGNOSIS — R5383 Other fatigue: Secondary | ICD-10-CM | POA: Diagnosis not present

## 2015-04-20 LAB — COMPREHENSIVE METABOLIC PANEL
ALBUMIN: 3.4 g/dL — AB (ref 3.5–4.8)
ALK PHOS: 93 IU/L (ref 39–117)
ALT: 27 IU/L (ref 0–32)
AST: 18 IU/L (ref 0–40)
Albumin/Globulin Ratio: 1.2 (ref 1.1–2.5)
BUN / CREAT RATIO: 20 (ref 11–26)
BUN: 16 mg/dL (ref 8–27)
Bilirubin Total: 0.4 mg/dL (ref 0.0–1.2)
CO2: 27 mmol/L (ref 18–29)
CREATININE: 0.82 mg/dL (ref 0.57–1.00)
Calcium: 8.9 mg/dL (ref 8.7–10.3)
Chloride: 99 mmol/L (ref 97–106)
GFR calc Af Amer: 79 mL/min/{1.73_m2} (ref >59)
GFR calc non Af Amer: 68 mL/min/{1.73_m2} (ref >59)
GLUCOSE: 112 mg/dL — AB (ref 65–99)
Globulin, Total: 2.8 g/dL (ref 1.5–4.5)
Potassium: 3.6 mmol/L (ref 3.5–5.2)
Sodium: 141 mmol/L (ref 136–144)
TOTAL PROTEIN: 6.2 g/dL (ref 6.0–8.5)

## 2015-04-20 LAB — CBC WITH DIFFERENTIAL/PLATELET
BASOS ABS: 0 10*3/uL (ref 0.0–0.2)
BASOS: 0 %
EOS (ABSOLUTE): 0.1 10*3/uL (ref 0.0–0.4)
Eos: 1 %
HEMOGLOBIN: 12.4 g/dL (ref 11.1–15.9)
Hematocrit: 35.4 % (ref 34.0–46.6)
IMMATURE GRANS (ABS): 0 10*3/uL (ref 0.0–0.1)
Immature Granulocytes: 0 %
LYMPHS: 11 %
Lymphocytes Absolute: 1 10*3/uL (ref 0.7–3.1)
MCH: 30.4 pg (ref 26.6–33.0)
MCHC: 35 g/dL (ref 31.5–35.7)
MCV: 87 fL (ref 79–97)
MONOCYTES: 6 %
Monocytes Absolute: 0.6 10*3/uL (ref 0.1–0.9)
NEUTROS ABS: 7.9 10*3/uL — AB (ref 1.4–7.0)
NEUTROS PCT: 82 %
Platelets: 368 10*3/uL (ref 150–379)
RBC: 4.08 x10E6/uL (ref 3.77–5.28)
RDW: 12.7 % (ref 12.3–15.4)
WBC: 9.7 10*3/uL (ref 3.4–10.8)

## 2015-04-20 LAB — LIPID PANEL
CHOLESTEROL TOTAL: 159 mg/dL (ref 100–199)
Chol/HDL Ratio: 5 ratio units — ABNORMAL HIGH (ref 0.0–4.4)
HDL: 32 mg/dL — ABNORMAL LOW (ref >39)
LDL CALC: 104 mg/dL — AB (ref 0–99)
Triglycerides: 114 mg/dL (ref 0–149)
VLDL CHOLESTEROL CAL: 23 mg/dL (ref 5–40)

## 2015-04-20 LAB — URINE CULTURE

## 2015-04-20 LAB — HEMOGLOBIN A1C
ESTIMATED AVERAGE GLUCOSE: 126 mg/dL
Hgb A1c MFr Bld: 6 % — ABNORMAL HIGH (ref 4.8–5.6)

## 2015-04-20 LAB — TSH: TSH: 2.88 u[IU]/mL (ref 0.450–4.500)

## 2015-04-22 ENCOUNTER — Telehealth: Payer: Self-pay

## 2015-04-22 NOTE — Telephone Encounter (Signed)
Advised pt of lab results. Pt verbally acknowledges understanding. Pt denies urinary sx. Advised pt to call if sx appear; will send in abx at that time. Renaldo Fiddler, CMA

## 2015-04-22 NOTE — Telephone Encounter (Signed)
-----   Message from Margarita Rana, MD sent at 04/21/2015 11:18 AM EST ----- Labs stable except does have bladder infection. Please see if patient is still having increased urine frequency and pain with urination. If so, please send in Cipro 250 bid for 5 days.  Thanks.

## 2015-05-31 ENCOUNTER — Other Ambulatory Visit: Payer: Self-pay | Admitting: Family Medicine

## 2015-05-31 DIAGNOSIS — F419 Anxiety disorder, unspecified: Secondary | ICD-10-CM

## 2015-05-31 NOTE — Telephone Encounter (Signed)
Printed, please fax or call in to pharmacy. Thank you.   

## 2015-06-03 ENCOUNTER — Telehealth: Payer: Self-pay | Admitting: Family Medicine

## 2015-06-03 DIAGNOSIS — F419 Anxiety disorder, unspecified: Secondary | ICD-10-CM

## 2015-06-03 MED ORDER — ALPRAZOLAM 0.5 MG PO TABS: 0.5000 mg | ORAL_TABLET | Freq: Two times a day (BID) | ORAL | Status: DC

## 2015-06-03 NOTE — Telephone Encounter (Signed)
Printed, please fax or call in to pharmacy. Thank you.   

## 2015-06-04 NOTE — Telephone Encounter (Signed)
rx called into [facility]. sd

## 2015-06-07 DIAGNOSIS — H353212 Exudative age-related macular degeneration, right eye, with inactive choroidal neovascularization: Secondary | ICD-10-CM | POA: Diagnosis not present

## 2015-06-14 DIAGNOSIS — Z86010 Personal history of colonic polyps: Secondary | ICD-10-CM | POA: Diagnosis not present

## 2015-06-21 ENCOUNTER — Other Ambulatory Visit: Payer: Self-pay | Admitting: Family Medicine

## 2015-06-21 DIAGNOSIS — G4700 Insomnia, unspecified: Secondary | ICD-10-CM

## 2015-06-27 DIAGNOSIS — I10 Essential (primary) hypertension: Secondary | ICD-10-CM | POA: Diagnosis not present

## 2015-06-27 DIAGNOSIS — E7800 Pure hypercholesterolemia, unspecified: Secondary | ICD-10-CM | POA: Diagnosis not present

## 2015-06-27 DIAGNOSIS — R079 Chest pain, unspecified: Secondary | ICD-10-CM | POA: Diagnosis not present

## 2015-06-27 DIAGNOSIS — Z9889 Other specified postprocedural states: Secondary | ICD-10-CM | POA: Diagnosis not present

## 2015-06-27 DIAGNOSIS — R0602 Shortness of breath: Secondary | ICD-10-CM | POA: Diagnosis not present

## 2015-07-03 ENCOUNTER — Other Ambulatory Visit: Payer: Self-pay | Admitting: Family Medicine

## 2015-07-03 DIAGNOSIS — F32A Depression, unspecified: Secondary | ICD-10-CM

## 2015-07-03 DIAGNOSIS — F329 Major depressive disorder, single episode, unspecified: Secondary | ICD-10-CM

## 2015-07-03 DIAGNOSIS — F419 Anxiety disorder, unspecified: Secondary | ICD-10-CM

## 2015-07-09 ENCOUNTER — Ambulatory Visit (INDEPENDENT_AMBULATORY_CARE_PROVIDER_SITE_OTHER): Payer: Medicare Other | Admitting: Family Medicine

## 2015-07-09 ENCOUNTER — Encounter: Payer: Self-pay | Admitting: Family Medicine

## 2015-07-09 VITALS — BP 120/62 | HR 80 | Temp 97.7°F | Resp 20 | Ht 65.0 in | Wt 160.0 lb

## 2015-07-09 DIAGNOSIS — Z1231 Encounter for screening mammogram for malignant neoplasm of breast: Secondary | ICD-10-CM

## 2015-07-09 DIAGNOSIS — Z0000 Encounter for general adult medical examination without abnormal findings: Secondary | ICD-10-CM

## 2015-07-09 NOTE — Patient Instructions (Signed)
Please call the [facility] at [facility] to schedule this at [phone]   

## 2015-07-09 NOTE — Progress Notes (Signed)
Patient ID: Jordan Gordon, female   DOB: 01-Jan-1934, 82 y.o.   MRN: [id number]       Patient: Jordan Gordon, Female    DOB: Aug 30, 1933, 82 y.o.   MRN: [id number] Visit Date: 07/09/2015  Today's Provider: Margarita Rana, MD   Chief Complaint  Patient presents with  . Medicare Wellness   Subjective:    Annual wellness visit Jordan Gordon is a 82 y.o. female. She feels well. She reports exercising stays active with daily activites. She reports she is sleeping well. 12/01/12 CPE 11/09/12 Mammo-BI-RADS 2 03/08/14 Colon-polyps-11 recheck in 1 yr  Lab Results  Component Value Date   WBC 9.7 04/19/2015   HGB 13.8 06/02/2014   HCT 35.4 04/19/2015   PLT 368 04/19/2015   GLUCOSE 112* 04/19/2015   CHOL 159 04/19/2015   TRIG 114 04/19/2015   HDL 32* 04/19/2015   LDLCALC 104* 04/19/2015   ALT 27 04/19/2015   AST 18 04/19/2015   NA 141 04/19/2015   K 3.6 04/19/2015   CL 99 04/19/2015   CREATININE 0.82 04/19/2015   BUN 16 04/19/2015   CO2 27 04/19/2015   TSH 2.880 04/19/2015   HGBA1C 6.0* 04/19/2015    -----------------------------------------------------------   Review of Systems  Constitutional: Negative.   HENT: Negative.   Eyes: Negative.   Respiratory: Negative.   Cardiovascular: Negative.   Gastrointestinal: Positive for constipation.  Endocrine: Negative.   Genitourinary: Negative.   Musculoskeletal: Positive for back pain.  Skin: Negative.   Allergic/Immunologic: Positive for environmental allergies.  Neurological: Negative.   Hematological: Bruises/bleeds easily.  Psychiatric/Behavioral: Negative.     Social History   Social History  . Marital Status: Widowed    Spouse Name: N/A  . Number of Children: N/A  . Years of Education: N/A   Occupational History  . Not on file.   Social History Main Topics  . Smoking status: Never Smoker   . Smokeless tobacco: Never Used  . Alcohol Use: 1.8 oz/week    3 Glasses of wine per week     Comment: OCCASIONAL  . Drug  Use: No  . Sexual Activity: Not on file   Other Topics Concern  . Not on file   Social History Narrative    Past Medical History  Diagnosis Date  . Anxiety      Patient Active Problem List   Diagnosis Date Noted  . Mechanical and motor problems with internal organs 04/18/2015  . Cramp in limb 04/18/2015  . Abnormal foot pulse 04/18/2015  . Clinical depression 04/18/2015  . Fatigue 04/18/2015  . Blood glucose elevated 04/18/2015  . Decreased potassium in the blood 04/18/2015  . LBP (low back pain) 04/18/2015  . Burning or prickling sensation 04/18/2015  . Esophagitis, reflux 04/18/2015  . Colon polyp 04/18/2015  . Other specified symptoms and signs involving the circulatory and respiratory systems 04/18/2015  . Anxiety 01/07/2015  . Chest pain 09/20/2014  . History of cardiac catheterization 03/08/2014  . Disorder resulting from impaired renal function 07/29/2009  . Muscle ache 03/15/2009  . Breath shortness 09/26/2008  . Adaptation reaction 07/21/2006  . Arthritis, degenerative 07/21/2006  . 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 Essential (primary) hypertension 04/16/1998  . Allergic rhinitis 03/12/1998  . Insomnia 03/04/1998  . Menopausal and perimenopausal disorder 02/02/1997  . HLD (hyperlipidemia) 12/06/1991    Past Surgical History  Procedure Laterality Date  . Knee surgery Left 2011    2007  . Abdominal hysterectomy  1979  .  Tonsillectomy  1940    ADENOIDS    Her family history includes Cancer in her brother and mother; Heart disease in her father.    Previous Medications   ALPRAZOLAM (XANAX) 0.5 MG TABLET    Take 1 tablet (0.5 mg total) by mouth 2 (two) times daily.   CYANOCOBALAMIN (RA VITAMIN B-12 TR) 1000 MCG TBCR    Take by mouth.   LUMIGAN 0.01 % SOLN       NAPROXEN SODIUM (ANAPROX) 220 MG TABLET    Take 220 mg by mouth 2 (two) times daily with a meal.   POTASSIUM CHLORIDE SA (K-DUR,KLOR-CON) 20 MEQ TABLET    TAKE ONE TABLET EVERY DAY    SERTRALINE (ZOLOFT) 100 MG TABLET    TAKE TWO TABLETS BY MOUTH EVERY DAY   TIMOLOL (TIMOPTIC) 0.5 % OPHTHALMIC SOLUTION       TOBRAMYCIN-DEXAMETHASONE (TOBRADEX) OPHTHALMIC SOLUTION       TRAZODONE (DESYREL) 100 MG TABLET    TAKE 1 TABLET BY MOUTH DAILY AT BEDTIME   TRIAMTERENE-HYDROCHLOROTHIAZIDE (MAXZIDE-25) 37.5-25 MG PER TABLET    TAKE ONE TABLET BY MOUTH EVERY DAY    Patient Care Team: Margarita Rana, MD as PCP - General (Family Medicine)     Objective:   Vitals: BP 120/62 mmHg  Pulse 80  Temp(Src) 97.7 F (36.5 C) (Oral)  Resp 20  Ht 5\' 5"  (1.651 m)  Wt 160 lb (72.576 kg)  BMI 26.63 kg/m2  Physical Exam  Constitutional: She is oriented to person, place, and time. She appears well-developed and well-nourished.  HENT:  Head: Normocephalic and atraumatic.  Right Ear: Tympanic membrane, external ear and ear canal normal.  Left Ear: Tympanic membrane, external ear and ear canal normal.  Nose: Nose normal.  Mouth/Throat: Uvula is midline, oropharynx is clear and moist and mucous membranes are normal.  Eyes: Conjunctivae, EOM and lids are normal. Pupils are equal, round, and reactive to light.  Neck: Trachea normal and normal range of motion. Neck supple. Carotid bruit is not present. No thyroid mass and no thyromegaly present.  Cardiovascular: Normal rate, regular rhythm and normal heart sounds.   Pulmonary/Chest: Effort normal and breath sounds normal.  Dense breast  Abdominal: Soft. Normal appearance and bowel sounds are normal. There is no hepatosplenomegaly. There is no tenderness.  Genitourinary: No breast swelling, tenderness or discharge.  Musculoskeletal: Normal range of motion.  Lymphadenopathy:    She has no cervical adenopathy.    She has no axillary adenopathy.  Neurological: She is alert and oriented to person, place, and time. She has normal strength. No cranial nerve deficit.  Skin: Skin is warm, dry and intact.  Senile purpura  Psychiatric: She has a normal  mood and affect. Her speech is normal and behavior is normal. Judgment and thought content normal. Cognition and memory are normal.    Activities of Daily Living In your present state of health, do you have any difficulty performing the following activities: 07/09/2015  Hearing? N  Vision? Y  Difficulty concentrating or making decisions? N  Walking or climbing stairs? N  Dressing or bathing? N  Doing errands, shopping? N    Fall Risk Assessment Fall Risk  07/09/2015  Falls in the past year? No     Depression Screen PHQ 2/9 Scores 07/09/2015  PHQ - 2 Score 0    Cognitive Testing - 6-CIT  Correct? Score   What year is it? yes 0 0 or 4  What month is it? yes 0 0 or  3  Memorize:    Pia Mau,  44,  [address],  [place],      What time is it? (within 1 hour) yes 0 0 or 3  Count backwards from 20 yes 0 0, 2, or 4  Name the months of the year no 2 0, 2, or 4  Repeat name & address above no 2 0, 2, 4, 6, 8, or 10       TOTAL SCORE  4/28   Interpretation:  Normal  Normal (0-7) Abnormal (8-28)       Assessment & Plan:     Annual Wellness Visit  Reviewed patient's Family Medical History Reviewed and updated list of patient's medical providers Assessment of cognitive impairment was done Assessed patient's functional ability Established a written schedule for health screening [place] Completed and Reviewed  Exercise Activities and Dietary recommendations Goals    None      Immunization History  Administered Date(s) Administered  . Influenza, High Dose Seasonal PF 03/04/2015  . Pneumococcal Conjugate-13 01/24/2014  . Pneumococcal Polysaccharide-23 04/18/2015  . Td 03/07/1999  . Zoster 03/24/2012    1. Medicare annual wellness visit, subsequent Stable. Continue to eat healthy and exercise.  2. Encounter for screening mammogram for breast cancer Ordered today.                - MM DIGITAL SCREENING BILATERAL; Future   Patient was seen and  examined by Jerrell Belfast, MD, and note scribed by Lynford Humphrey, [place].   I have reviewed the document for accuracy and completeness and I agree with above. Jerrell Belfast, MD   Margarita Rana, MD     ------------------------------------------------------------------------------------------------------------

## 2015-07-22 ENCOUNTER — Ambulatory Visit: Payer: Medicare Other | Admitting: Anesthesiology

## 2015-07-22 ENCOUNTER — Ambulatory Visit
Admission: RE | Admit: 2015-07-22 | Discharge: 2015-07-22 | Disposition: A | Discharge status: Home / Self Care | Payer: Medicare Other | Source: Ambulatory Visit | Attending: Unknown Physician Specialty | Admitting: Unknown Physician Specialty

## 2015-07-22 ENCOUNTER — Encounter
Admission: RE | Disposition: A | Discharge status: Home / Self Care | Payer: Self-pay | Source: Ambulatory Visit | Attending: Unknown Physician Specialty

## 2015-07-22 ENCOUNTER — Encounter: Payer: Self-pay | Admitting: *Deleted

## 2015-07-22 ENCOUNTER — Encounter: Payer: Self-pay | Admitting: Oncology

## 2015-07-22 DIAGNOSIS — D123 Benign neoplasm of transverse colon: Secondary | ICD-10-CM | POA: Insufficient documentation

## 2015-07-22 DIAGNOSIS — Z1211 Encounter for screening for malignant neoplasm of colon: Secondary | ICD-10-CM | POA: Insufficient documentation

## 2015-07-22 DIAGNOSIS — Z86010 Personal history of colonic polyps: Secondary | ICD-10-CM | POA: Insufficient documentation

## 2015-07-22 DIAGNOSIS — D128 Benign neoplasm of rectum: Secondary | ICD-10-CM | POA: Diagnosis not present

## 2015-07-22 DIAGNOSIS — F418 Other specified anxiety disorders: Secondary | ICD-10-CM | POA: Insufficient documentation

## 2015-07-22 DIAGNOSIS — D122 Benign neoplasm of ascending colon: Secondary | ICD-10-CM | POA: Insufficient documentation

## 2015-07-22 DIAGNOSIS — Z90710 Acquired absence of both cervix and uterus: Secondary | ICD-10-CM | POA: Insufficient documentation

## 2015-07-22 DIAGNOSIS — M1990 Unspecified osteoarthritis, unspecified site: Secondary | ICD-10-CM | POA: Insufficient documentation

## 2015-07-22 DIAGNOSIS — D125 Benign neoplasm of sigmoid colon: Secondary | ICD-10-CM | POA: Insufficient documentation

## 2015-07-22 DIAGNOSIS — Z79899 Other long term (current) drug therapy: Secondary | ICD-10-CM | POA: Diagnosis not present

## 2015-07-22 DIAGNOSIS — K621 Rectal polyp: Secondary | ICD-10-CM | POA: Insufficient documentation

## 2015-07-22 DIAGNOSIS — D124 Benign neoplasm of descending colon: Secondary | ICD-10-CM | POA: Diagnosis not present

## 2015-07-22 DIAGNOSIS — F419 Anxiety disorder, unspecified: Secondary | ICD-10-CM | POA: Diagnosis not present

## 2015-07-22 DIAGNOSIS — R0602 Shortness of breath: Secondary | ICD-10-CM | POA: Diagnosis not present

## 2015-07-22 DIAGNOSIS — K648 Other hemorrhoids: Secondary | ICD-10-CM | POA: Insufficient documentation

## 2015-07-22 DIAGNOSIS — I10 Essential (primary) hypertension: Secondary | ICD-10-CM | POA: Insufficient documentation

## 2015-07-22 DIAGNOSIS — Z885 Allergy status to narcotic agent status: Secondary | ICD-10-CM | POA: Diagnosis not present

## 2015-07-22 DIAGNOSIS — K635 Polyp of colon: Secondary | ICD-10-CM | POA: Diagnosis not present

## 2015-07-22 DIAGNOSIS — D120 Benign neoplasm of cecum: Secondary | ICD-10-CM | POA: Diagnosis not present

## 2015-07-22 HISTORY — PX: COLONOSCOPY WITH PROPOFOL: SHX5780

## 2015-07-22 HISTORY — DX: Essential (primary) hypertension: I10

## 2015-07-22 LAB — HM COLONOSCOPY

## 2015-07-22 SURGERY — COLONOSCOPY WITH PROPOFOL
Anesthesia: General

## 2015-07-22 MED ORDER — PROPOFOL 500 MG/50ML IV EMUL
INTRAVENOUS | Status: DC | PRN
  Administered 2015-07-22: 75 ug/kg/min via INTRAVENOUS

## 2015-07-22 MED ORDER — MIDAZOLAM HCL 5 MG/5ML IJ SOLN
INTRAMUSCULAR | Status: DC | PRN
  Administered 2015-07-22: 1 mg via INTRAVENOUS

## 2015-07-22 MED ORDER — FENTANYL CITRATE (PF) 100 MCG/2ML IJ SOLN
INTRAMUSCULAR | Status: DC | PRN
  Administered 2015-07-22: 50 ug via INTRAVENOUS

## 2015-07-22 MED ORDER — LIDOCAINE HCL (PF) 2 % IJ SOLN
INTRAMUSCULAR | Status: DC | PRN
  Administered 2015-07-22: 60 mg

## 2015-07-22 MED ORDER — SODIUM CHLORIDE 0.9 % IV SOLN
INTRAVENOUS | Status: DC
  Administered 2015-07-22: 11:00:00 via INTRAVENOUS

## 2015-07-22 MED ORDER — PROPOFOL 10 MG/ML IV BOLUS
INTRAVENOUS | Status: DC | PRN
  Administered 2015-07-22: 20 mg via INTRAVENOUS
  Administered 2015-07-22: 30 mg via INTRAVENOUS

## 2015-07-22 MED ORDER — SODIUM CHLORIDE 0.9 % IV SOLN
INTRAVENOUS | Status: DC
  Administered 2015-07-22: 1000 mL via INTRAVENOUS

## 2015-07-22 MED ORDER — PIPERACILLIN-TAZOBACTAM 3.375 G IVPB 30 MIN
3.3750 g | Freq: Once | INTRAVENOUS | Status: AC
  Administered 2015-07-22: 3.375 g via INTRAVENOUS

## 2015-07-22 MED ORDER — SODIUM CHLORIDE 0.9 % IV SOLN: INTRAVENOUS | Status: DC

## 2015-07-22 MED ORDER — ONDANSETRON HCL 4 MG/2ML IJ SOLN
INTRAMUSCULAR | Status: DC | PRN
  Administered 2015-07-22: 4 mg via INTRAVENOUS

## 2015-07-22 NOTE — H&P (Signed)
Primary Care Physician:  Margarita Rana, MD Primary Gastroenterologist:  Dr. Vira Agar  Pre-Procedure History & Physical: HPI:  Jordan Gordon is a 82 y.o. female is here for an colonoscopy.   Past Medical History  Diagnosis Date  . Anxiety   . Hypertension     Past Surgical History  Procedure Laterality Date  . Knee surgery Left 2011    2007  . Abdominal hysterectomy  1979  . Tonsillectomy  1940    ADENOIDS    Prior to Admission medications   Medication Sig Start Date End Date Taking? Authorizing Provider  ALPRAZolam Duanne Moron) 0.5 MG tablet Take 1 tablet (0.5 mg total) by mouth 2 (two) times daily. 06/03/15  Yes Margarita Rana, MD  Cyanocobalamin (RA VITAMIN B-12 TR) 1000 MCG TBCR Take by mouth.   Yes Historical Provider, MD  LUMIGAN 0.01 % SOLN  03/20/15  Yes Historical Provider, MD  naproxen sodium (ANAPROX) 220 MG tablet Take 220 mg by mouth 2 (two) times daily with a meal.   Yes Historical Provider, MD  potassium chloride SA (K-DUR,KLOR-CON) 20 MEQ tablet TAKE ONE TABLET EVERY DAY 02/12/15  Yes Margarita Rana, MD  sertraline (ZOLOFT) 100 MG tablet TAKE TWO TABLETS BY MOUTH EVERY DAY 07/03/15  Yes Jerrol Banana., MD  timolol (TIMOPTIC) 0.5 % ophthalmic solution  03/20/15  Yes Historical Provider, MD  tobramycin-dexamethasone [facility]) ophthalmic solution  03/27/15  Yes Historical Provider, MD  traZODone (DESYREL) 100 MG tablet TAKE 1 TABLET BY MOUTH DAILY AT BEDTIME 06/21/15  Yes Margarita Rana, MD  triamterene-hydrochlorothiazide ([id number]) 37.5-25 MG per tablet TAKE ONE TABLET BY MOUTH EVERY DAY 01/07/15  Yes Margarita Rana, MD    Allergies as of 06/19/2015 - Review Complete 04/18/2015  Allergen Reaction Noted  . Codeine  04/18/2015  . No known allergies  04/18/2015    Family History  Problem Relation Age of Onset  . Cancer Mother   . Heart disease Father   . Cancer Brother     Social History   Social History  . Marital Status: Widowed    Spouse Name: N/A  .  Number of Children: N/A  . Years of Education: N/A   Occupational History  . Not on file.   Social History Main Topics  . Smoking status: Never Smoker   . Smokeless tobacco: Never Used  . Alcohol Use: 1.8 oz/week    3 Glasses of wine per week     Comment: OCCASIONAL  . Drug Use: No  . Sexual Activity: Not on file   Other Topics Concern  . Not on file   Social History Narrative    Review of Systems: See HPI, otherwise negative ROS  Physical Exam: BP 148/86 mmHg  Pulse 108  Temp(Src) 97.8 F (36.6 C) (Tympanic)  Resp 16  Ht 5\' 5"  (1.651 m)  Wt 70.308 kg (155 lb)  BMI 25.79 kg/m2  SpO2 100% General:   Alert,  pleasant and cooperative in NAD Head:  Normocephalic and atraumatic. Neck:  Supple; no masses or thyromegaly. Lungs:  Clear throughout to auscultation.    Heart:  Regular rate and rhythm. Abdomen:  Soft, nontender and nondistended. Normal bowel sounds, without guarding, and without rebound.   Neurologic:  Alert and  oriented x4;  grossly normal neurologically.  Impression/Plan: Jordan Gordon is here for an colonoscopy to be performed for [facility] colon polyps  Risks, benefits, limitations, and alternatives regarding  colonoscopy have been reviewed with the patient.  Questions have been answered.  All parties agreeable.   Gaylyn Cheers, MD  07/22/2015, 11:36 AM

## 2015-07-22 NOTE — Op Note (Signed)
[facility]astroenter[facility] Patient Name: Jordan Gordon Procedure Date: 07/22/2015 11:22 AM MRN: [id number] Account #: [id number] Date of Birth: 08-Whiteaker-1935 Admit Type: Outpatient Age: 82 Room: [facility] ENDO ROOM 1 Gender: Female Note Status: Finalized Procedure:         Colonoscopy Indications:       High risk colon cancer surveillance: Personal history of                     colonic polyps Providers:         Manya Silvas, MD Referring MD:      Jerrell Belfast, MD (Referring MD) Medicines:         Propofol per Anesthesia Complications:     No immediate complications. Procedure:         Pre-Anesthesia Assessment:                    - After reviewing the risks and benefits, the patient was                     deemed in satisfactory condition to undergo the procedure.                    After obtaining informed consent, the colonoscope was                     passed under direct vision. Throughout the procedure, the                     patient's blood pressure, pulse, and oxygen saturations                     were monitored continuously. The Colonoscope was                     introduced through the anus and advanced to the the cecum,                     identified by appendiceal orifice and ileocecal valve. The                     colonoscopy was performed without difficulty. The patient                     tolerated the procedure well. The quality of the bowel                     preparation was adequate to identify polyps. Findings:      13 polyps were removed mostly from the cecum and ascending and       transverse colon. A feww from descending colon. clips were placed in 3       of the sites to lessen the chance of delayed bleeding.. No bleeding       today at any removal site.      Rectal exam shows a tight sphincter which I dilated with little finger       enough to get the scope to pass easily. Impression:        - No specimens collected. Recommendation:     - Await pathology results. Manya Silvas, MD 07/22/2015 12:38:25 PM This report has been signed electronically. Number of Addenda: 0 Note Initiated On: 07/22/2015 11:22 AM Scope Withdrawal Time: 0 hours 41 minutes 16 seconds  Total Procedure Duration: 0 hours  51 minutes 26 seconds       [facility]

## 2015-07-22 NOTE — Anesthesia Preprocedure Evaluation (Signed)
Anesthesia Evaluation  Patient identified by MRN, date of birth, ID band Patient awake    Reviewed: Allergy & Precautions, NPO status , Patient's Chart, lab work & pertinent test results  Airway Mallampati: III  TM Distance: <3 FB Neck ROM: Limited   Comment: Short neck Dental  (+) Chipped, Caps   Pulmonary shortness of breath and with exertion,    Pulmonary exam normal breath sounds clear to auscultation       Cardiovascular hypertension, Pt. on medications Normal cardiovascular exam     Neuro/Psych Anxiety Depression    GI/Hepatic negative GI ROS, Neg liver ROS,   Endo/Other  negative endocrine ROS  Renal/GU Renal InsufficiencyRenal disease  negative genitourinary   Musculoskeletal  (+) Arthritis , Osteoarthritis,    Abdominal Normal abdominal exam  (+)   Peds negative pediatric ROS (+)  Hematology negative hematology ROS (+)   Anesthesia Other Findings   Reproductive/Obstetrics                             Anesthesia Physical Anesthesia Plan  ASA: II  Anesthesia Plan: General   Post-op Pain Management:    Induction: Intravenous  Airway Management Planned: Nasal Cannula  Additional Equipment:   Intra-op Plan:   Post-operative Plan:   Informed Consent: I have reviewed the patients History and Physical, chart, labs and discussed the procedure including the risks, benefits and alternatives for the proposed anesthesia with the patient or authorized representative who has indicated his/her understanding and acceptance.   Dental advisory given  Plan Discussed with: CRNA and Surgeon  Anesthesia Plan Comments:         Anesthesia Quick Evaluation

## 2015-07-22 NOTE — Transfer of Care (Signed)
Immediate Anesthesia Transfer of Care Note  Patient: Jordan Gordon  Procedure(s) Performed: Procedure(s): COLONOSCOPY WITH PROPOFOL (N/A)  Patient Location: PACU  Anesthesia Type:General  Level of Consciousness: sedated  Airway & Oxygen Therapy: Patient Spontanous Breathing and Patient connected to nasal cannula oxygen  Post-op Assessment: Report given to RN and Post -op Vital signs reviewed and stable  Post vital signs: Reviewed and stable  Last Vitals:  Filed Vitals:   07/22/15 1045  BP: 148/86  Pulse: 108  Temp: 36.6 C  Resp: 16    Complications: No apparent anesthesia complications

## 2015-07-24 ENCOUNTER — Encounter: Payer: Self-pay | Admitting: Unknown Physician Specialty

## 2015-07-24 LAB — SURGICAL PATHOLOGY

## 2015-07-24 NOTE — Anesthesia Postprocedure Evaluation (Signed)
Anesthesia Post Note  Patient: Jordan Gordon  Procedure(s) Performed: Procedure(s) (LRB): COLONOSCOPY WITH PROPOFOL (N/A)  Patient location during evaluation: Endoscopy Anesthesia Type: General Level of consciousness: awake and alert and oriented Pain management: pain level controlled Vital Signs Assessment: post-procedure vital signs reviewed and stable Respiratory status: spontaneous breathing Cardiovascular status: blood pressure returned to baseline Anesthetic complications: no    Last Vitals:  Filed Vitals:   07/22/15 1300 07/22/15 1310  BP: 150/73 124/95  Pulse: 86 76  Temp:    Resp: 21 19    Last Pain:  Filed Vitals:   07/23/15 0809  PainSc: 0-No pain                 Axyl Sitzman

## 2015-07-25 DIAGNOSIS — R195 Other fecal abnormalities: Secondary | ICD-10-CM | POA: Diagnosis not present

## 2015-07-25 DIAGNOSIS — K921 Melena: Secondary | ICD-10-CM | POA: Diagnosis not present

## 2015-07-26 DIAGNOSIS — R195 Other fecal abnormalities: Secondary | ICD-10-CM | POA: Diagnosis not present

## 2015-08-09 DIAGNOSIS — H353212 Exudative age-related macular degeneration, right eye, with inactive choroidal neovascularization: Secondary | ICD-10-CM | POA: Diagnosis not present

## 2015-08-12 DIAGNOSIS — H353122 Nonexudative age-related macular degeneration, left eye, intermediate dry stage: Secondary | ICD-10-CM | POA: Diagnosis not present

## 2015-08-12 DIAGNOSIS — H353212 Exudative age-related macular degeneration, right eye, with inactive choroidal neovascularization: Secondary | ICD-10-CM | POA: Diagnosis not present

## 2015-08-14 ENCOUNTER — Encounter: Payer: Self-pay | Admitting: *Deleted

## 2015-09-12 ENCOUNTER — Encounter: Payer: Self-pay | Admitting: Family Medicine

## 2015-09-12 ENCOUNTER — Ambulatory Visit (INDEPENDENT_AMBULATORY_CARE_PROVIDER_SITE_OTHER): Payer: Medicare Other | Admitting: Family Medicine

## 2015-09-12 VITALS — BP 128/88 | HR 84 | Temp 97.6°F | Resp 16 | Wt 161.0 lb

## 2015-09-12 DIAGNOSIS — F32A Depression, unspecified: Secondary | ICD-10-CM

## 2015-09-12 DIAGNOSIS — F419 Anxiety disorder, unspecified: Secondary | ICD-10-CM

## 2015-09-12 DIAGNOSIS — G4700 Insomnia, unspecified: Secondary | ICD-10-CM | POA: Diagnosis not present

## 2015-09-12 DIAGNOSIS — N644 Mastodynia: Secondary | ICD-10-CM

## 2015-09-12 DIAGNOSIS — F329 Major depressive disorder, single episode, unspecified: Secondary | ICD-10-CM

## 2015-09-12 DIAGNOSIS — N6452 Nipple discharge: Secondary | ICD-10-CM | POA: Diagnosis not present

## 2015-09-12 DIAGNOSIS — I10 Essential (primary) hypertension: Secondary | ICD-10-CM

## 2015-09-12 DIAGNOSIS — E876 Hypokalemia: Secondary | ICD-10-CM

## 2015-09-12 MED ORDER — TRIAMTERENE-HCTZ 37.5-25 MG PO TABS: 1.0000 | ORAL_TABLET | Freq: Every day | ORAL | Status: DC

## 2015-09-12 MED ORDER — TRAZODONE HCL 100 MG PO TABS: 100.0000 mg | ORAL_TABLET | Freq: Every day | ORAL | Status: DC

## 2015-09-12 MED ORDER — SERTRALINE HCL 100 MG PO TABS: 200.0000 mg | ORAL_TABLET | Freq: Every day | ORAL | Status: DC

## 2015-09-12 MED ORDER — POTASSIUM CHLORIDE CRYS ER 20 MEQ PO TBCR: 20.0000 meq | EXTENDED_RELEASE_TABLET | Freq: Every day | ORAL | Status: DC

## 2015-09-12 MED ORDER — ALPRAZOLAM 0.5 MG PO TABS: 0.5000 mg | ORAL_TABLET | Freq: Two times a day (BID) | ORAL | Status: DC

## 2015-09-12 NOTE — Progress Notes (Signed)
Patient ID: Jordan Gordon, female   DOB: 04/11/1934, 82 y.o.   MRN: [id number]         Patient: Jordan Gordon Female    DOB: 1934-05-22   82 y.o.   MRN: [id number] Visit Date: 09/12/2015  Today's Provider: Margarita Rana, MD   Chief Complaint  Patient presents with  . Breast Discharge  . Breast Pain   Subjective:    HPI   Pt comes in today complaining of left breast pain and left nipple discharge.  She reports waking up with breast/nipple pain about two weeks ago.  She noticed a discharge from her nipple at that time.  She said the discharge had a foul odor.  She had that happen again a couple more times but has since stopped.  She still reports having pain.  She has had a negative left breast biopsy several years ago.  Do for mammogram.  Has had inverted nipple on left for a long time.    Also, chronic problems stable. Reviewed today. No current concerns. Would like her medications sent in for one year as provider is relocating. Will call if any problems or concerns.  Not due for Wellness until fall.      Allergies  Allergen Reactions  . Codeine     nausea  . No Known Allergies    Previous Medications   ALPRAZOLAM (XANAX) 0.5 MG TABLET    Take 1 tablet (0.5 mg total) by mouth 2 (two) times daily.   CYANOCOBALAMIN (RA VITAMIN B-12 TR) 1000 MCG TBCR    Take by mouth.   LUMIGAN 0.01 % SOLN       NAPROXEN SODIUM (ANAPROX) 220 MG TABLET    Take 220 mg by mouth 2 (two) times daily with a meal.   POTASSIUM CHLORIDE SA (K-DUR,KLOR-CON) 20 MEQ TABLET    TAKE ONE TABLET EVERY DAY   SERTRALINE (ZOLOFT) 100 MG TABLET    TAKE TWO TABLETS BY MOUTH EVERY DAY   TIMOLOL (TIMOPTIC) 0.5 % OPHTHALMIC SOLUTION       TOBRAMYCIN-DEXAMETHASONE (TOBRADEX) OPHTHALMIC SOLUTION       TRAZODONE (DESYREL) 100 MG TABLET    TAKE 1 TABLET BY MOUTH DAILY AT BEDTIME   TRIAMTERENE-HYDROCHLOROTHIAZIDE (MAXZIDE-25) 37.5-25 MG PER TABLET    TAKE ONE TABLET BY MOUTH EVERY DAY    Review of Systems  Constitutional:  Negative for fever, chills, diaphoresis, activity change, appetite change, fatigue and unexpected weight change.  Skin: Negative.   Neurological: Negative for dizziness, light-headedness and numbness.    Social History  Substance Use Topics  . Smoking status: Never Smoker   . Smokeless tobacco: Never Used  . Alcohol Use: 1.8 oz/week    3 Glasses of wine per week     Comment: OCCASIONAL   Objective:   BP 128/88 mmHg  Pulse 84  Temp(Src) 97.6 F (36.4 C) (Oral)  Resp 16  Wt 161 lb (73.029 kg)  Physical Exam  Constitutional: She is oriented to person, place, and time. She appears well-developed and well-nourished.  Pulmonary/Chest: Left breast exhibits inverted nipple (Has been inverted for several years. ) and tenderness. Left breast exhibits no mass, no nipple discharge and no skin change.  Neurological: She is alert and oriented to person, place, and time.  Psychiatric: She has a normal mood and affect. Her behavior is normal. Judgment and thought content normal.      Assessment & Plan:      1. Breast discharge New problem; will check labs and  order a diag mammogram and U/S.  Suspect it could have been an infection. Further plan pending these results.  - Prolactin - MM Digital Diagnostic Bilat - US BREAST LTD UNI LEFT INC AXILLA - US BREAST LTD UNI RIGHT INC AXILLA  2. Essential hypertension Condition is stable. Please continue current medication and  plan of care as noted.   - triamterene-hydrochlorothiazide (MAXZIDE-25) 37.5-25 MG tablet; Take 1 tablet by mouth daily.  Dispense: 90 tablet; Refill: 3  3. Insomnia Condition is stable. Please continue current medication and  plan of care as noted.   - traZODone (DESYREL) 100 MG tablet; Take 1 tablet (100 mg total) by mouth at bedtime.  Dispense: 90 tablet; Refill: 3  4. Anxiety Condition is stable. Please continue current medication and  plan of care as noted.   - sertraline (ZOLOFT) 100 MG tablet; Take 2 tablets (200 mg  total) by mouth daily.  Dispense: 180 tablet; Refill: 3 - ALPRAZolam (XANAX) 0.5 MG tablet; Take 1 tablet (0.5 mg total) by mouth 2 (two) times daily.  Dispense: 180 tablet; Refill: 1  5. Clinical depression Condition is stable. Please continue current medication and  plan of care as noted.   - sertraline (ZOLOFT) 100 MG tablet; Take 2 tablets (200 mg total) by mouth daily.  Dispense: 180 tablet; Refill: 3  6. Decreased potassium in the blood Condition is stable. Please continue current medication and  plan of care as noted.   - potassium chloride SA (K-DUR,KLOR-CON) 20 MEQ tablet; Take 1 tablet (20 mEq total) by mouth daily.  Dispense: 180 tablet; Refill: 3   7. Breast pain As above.  - MM Digital Diagnostic Bilat - US BREAST LTD UNI LEFT INC AXILLA - US BREAST LTD UNI RIGHT INC AXILLA   Patient was seen and examined by Jerrell Belfast, MD, and note scribed by Ashley Royalty, CMA.  I have reviewed the document for accuracy and completeness and I agree with above. - Jerrell Belfast, MD   Margarita Rana, MD  [facility]

## 2015-09-13 ENCOUNTER — Telehealth: Payer: Self-pay

## 2015-09-13 LAB — PROLACTIN: PROLACTIN: 15.3 ng/mL (ref 4.8–23.3)

## 2015-09-13 NOTE — Telephone Encounter (Signed)
-----   Message from Margarita Rana, MD sent at 09/13/2015  8:46 AM EDT ----- Prolactin normal.  Please notify patient. Thanks.

## 2015-09-13 NOTE — Telephone Encounter (Signed)
Pt advised.   Thanks,   -Laura  

## 2015-09-26 ENCOUNTER — Ambulatory Visit
Admission: RE | Admit: 2015-09-26 | Discharge: 2015-09-26 | Disposition: A | Discharge status: Home / Self Care | Payer: Medicare Other | Source: Ambulatory Visit | Attending: Family Medicine | Admitting: Family Medicine

## 2015-09-26 ENCOUNTER — Ambulatory Visit: Admission: RE | Admit: 2015-09-26 | Payer: Medicare Other | Source: Ambulatory Visit

## 2015-09-26 DIAGNOSIS — N6452 Nipple discharge: Secondary | ICD-10-CM | POA: Diagnosis not present

## 2015-09-26 DIAGNOSIS — N644 Mastodynia: Secondary | ICD-10-CM | POA: Diagnosis not present

## 2015-09-26 DIAGNOSIS — N6489 Other specified disorders of breast: Secondary | ICD-10-CM | POA: Diagnosis not present

## 2015-09-26 DIAGNOSIS — R928 Other abnormal and inconclusive findings on diagnostic imaging of breast: Secondary | ICD-10-CM | POA: Diagnosis not present

## 2015-09-27 DIAGNOSIS — H353212 Exudative age-related macular degeneration, right eye, with inactive choroidal neovascularization: Secondary | ICD-10-CM | POA: Diagnosis not present

## 2015-10-01 DIAGNOSIS — H401131 Primary open-angle glaucoma, bilateral, mild stage: Secondary | ICD-10-CM | POA: Diagnosis not present

## 2015-10-08 DIAGNOSIS — H401131 Primary open-angle glaucoma, bilateral, mild stage: Secondary | ICD-10-CM | POA: Diagnosis not present

## 2015-10-24 ENCOUNTER — Other Ambulatory Visit: Payer: Self-pay | Admitting: Family Medicine

## 2015-10-24 NOTE — Telephone Encounter (Signed)
Dr Sharyon Medicus patient right now-aa

## 2015-11-08 DIAGNOSIS — H353212 Exudative age-related macular degeneration, right eye, with inactive choroidal neovascularization: Secondary | ICD-10-CM | POA: Diagnosis not present

## 2015-12-18 ENCOUNTER — Telehealth: Payer: Self-pay | Admitting: Emergency Medicine

## 2015-12-18 NOTE — Telephone Encounter (Signed)
Pt requesting a refill on Alprazolam 0.5 mg  BID PRN. She was a Public affairs consultant pt.   [facility].

## 2015-12-19 ENCOUNTER — Telehealth: Payer: Self-pay | Admitting: Physician Assistant

## 2015-12-19 DIAGNOSIS — F419 Anxiety disorder, unspecified: Secondary | ICD-10-CM

## 2015-12-19 MED ORDER — ALPRAZOLAM 0.5 MG PO TABS: 0.5000 mg | ORAL_TABLET | Freq: Two times a day (BID) | ORAL | Status: DC

## 2015-12-19 NOTE — Telephone Encounter (Signed)
Please call in Alprazolam 0.5 mg Take one tab PO BID prn. #60 RF 5 to [facility]. Thanks.

## 2015-12-19 NOTE — Telephone Encounter (Signed)
Pt is requesting refill on ALPRAZolam (XANAX) 0.5 MG. Sent into [facility].This is a pt of Dr Sharyon Medicus but will be seeing you next week

## 2015-12-19 NOTE — Telephone Encounter (Signed)
Prescription was called into [facility] pharmacy.  Thanks,  -Joseline

## 2015-12-25 ENCOUNTER — Ambulatory Visit: Payer: Self-pay | Admitting: Physician Assistant

## 2015-12-30 ENCOUNTER — Encounter: Payer: Self-pay | Admitting: Physician Assistant

## 2015-12-30 ENCOUNTER — Ambulatory Visit (INDEPENDENT_AMBULATORY_CARE_PROVIDER_SITE_OTHER): Payer: Medicare Other | Admitting: Physician Assistant

## 2015-12-30 VITALS — BP 132/72 | HR 84 | Temp 97.5°F | Resp 16 | Wt 161.0 lb

## 2015-12-30 DIAGNOSIS — I10 Essential (primary) hypertension: Secondary | ICD-10-CM

## 2015-12-30 DIAGNOSIS — F329 Major depressive disorder, single episode, unspecified: Secondary | ICD-10-CM

## 2015-12-30 DIAGNOSIS — F32A Depression, unspecified: Secondary | ICD-10-CM

## 2015-12-30 NOTE — Patient Instructions (Signed)
Hypertension Hypertension, commonly called high blood pressure, is when the force of blood pumping through your arteries is too strong. Your arteries are the blood vessels that carry blood from your heart throughout your body. A blood pressure reading consists of a higher number over a lower number, such as 110/72. The higher number (systolic) is the pressure inside your arteries when your heart pumps. The lower number (diastolic) is the pressure inside your arteries when your heart relaxes. Ideally you want your blood pressure below 120/80. Hypertension forces your heart to work harder to pump blood. Your arteries Fretz become narrow or stiff. Having untreated or uncontrolled hypertension can cause heart attack, stroke, kidney disease, and other problems. RISK FACTORS Some risk factors for high blood pressure are controllable. Others are not.  Risk factors you cannot control include:   Race. You Hauck be at higher risk if you are African American.  Age. Risk increases with age.  Gender. Men are at higher risk than women before age 45 years. After age 65, women are at higher risk than men. Risk factors you can control include:  Not getting enough exercise or physical activity.  Being overweight.  Getting too much fat, sugar, calories, or salt in your diet.  Drinking too much alcohol. SIGNS AND SYMPTOMS Hypertension does not usually cause signs or symptoms. Extremely high blood pressure (hypertensive crisis) Gowan cause headache, anxiety, shortness of breath, and nosebleed. DIAGNOSIS To check if you have hypertension, your health care provider will measure your blood pressure while you are seated, with your arm held at the level of your heart. It should be measured at least twice using the same arm. Certain conditions can cause a difference in blood pressure between your right and left arms. A blood pressure reading that is higher than normal on one occasion does not mean that you need treatment. If  it is not clear whether you have high blood pressure, you Platter be asked to return on a different day to have your blood pressure checked again. Or, you Leer be asked to monitor your blood pressure at home for 1 or more weeks. TREATMENT Treating high blood pressure includes making lifestyle changes and possibly taking medicine. Living a healthy lifestyle can help lower high blood pressure. You Pyatt need to change some of your habits. Lifestyle changes Havener include:  Following the DASH diet. This diet is high in fruits, vegetables, and whole grains. It is low in salt, red meat, and added sugars.  Keep your sodium intake below 2,300 mg per day.  Getting at least 30-45 minutes of aerobic exercise at least 4 times per week.  Losing weight if necessary.  Not smoking.  Limiting alcoholic beverages.  Learning ways to reduce stress. Your health care provider Artz prescribe medicine if lifestyle changes are not enough to get your blood pressure under control, and if one of the following is true:  You are 18-59 years of age and your systolic blood pressure is above 140.  You are 60 years of age or older, and your systolic blood pressure is above 150.  Your diastolic blood pressure is above 90.  You have diabetes, and your systolic blood pressure is over 140 or your diastolic blood pressure is over 90.  You have kidney disease and your blood pressure is above 140/90.  You have heart disease and your blood pressure is above 140/90. Your personal target blood pressure Finck vary depending on your medical conditions, your age, and other factors. HOME CARE INSTRUCTIONS    Have your blood pressure rechecked as directed by your health care provider.   Take medicines only as directed by your health care provider. Follow the directions carefully. Blood pressure medicines must be taken as prescribed. The medicine does not work as well when you skip doses. Skipping doses also puts you at risk for  problems.  Do not smoke.   Monitor your blood pressure at home as directed by your health care provider. SEEK MEDICAL CARE IF:   You think you are having a reaction to medicines taken.  You have recurrent headaches or feel dizzy.  You have swelling in your ankles.  You have trouble with your vision. SEEK IMMEDIATE MEDICAL CARE IF:  You develop a severe headache or confusion.  You have unusual weakness, numbness, or feel faint.  You have severe chest or abdominal pain.  You vomit repeatedly.  You have trouble breathing. MAKE SURE YOU:   Understand these instructions.  Will watch your condition.  Will get help right away if you are not doing well or get worse.   This information is not intended to replace advice given to you by your health care provider. Make sure you discuss any questions you have with your health care provider.   Document Released: 05/25/2005 Document Revised: 10/09/2014 Document Reviewed: 03/17/2013 Elsevier Interactive Patient Education 2016 Elsevier Inc.  

## 2015-12-30 NOTE — Progress Notes (Signed)
       Patient: Jordan Gordon Female    DOB: 1933/08/07   82 y.o.   MRN: [id number] Visit Date: 12/30/2015  Today's Provider: Mar Daring, PA-C   Chief Complaint  Patient presents with  . Hypertension  . Depression   Subjective:    HPI   Pt is mainly here today to get established with [name] since Dr. Venia Minks left the practice.    Hypertension, follow-up:  BP Readings from Last 3 Encounters:  12/30/15 132/72  09/12/15 128/88  07/22/15 (!) 124/95    She was last seen for hypertension 3 months ago.  BP at that visit was 128/88. Management since that visit includes none. She reports good compliance with treatment. She is not having side effects.  She is not exercising. She is adherent to low salt diet.   Outside blood pressures are not being checked. She is experiencing none.  Patient denies chest pain, chest pressure/discomfort, claudication, dyspnea, exertional chest pressure/discomfort, fatigue, irregular heart beat, lower extremity edema, near-syncope, orthopnea, palpitations, paroxysmal nocturnal dyspnea, syncope and tachypnea.   Cardiovascular risk factors include advanced age (older than 64 for men, 27 for women) and hypertension.   Wt Readings from Last 3 Encounters:  12/30/15 161 lb (73 kg)  09/12/15 161 lb (73 kg)  07/22/15 155 lb (70.3 kg)   ------------------------------------------------------------------------      Allergies  Allergen Reactions  . Codeine     nausea  . No Known Allergies    No outpatient prescriptions have been marked as taking for the 12/30/15 encounter (Office Visit) with Mar Daring, PA-C.    Review of Systems  Constitutional: Negative.   HENT: Negative.   Eyes: Negative.   Respiratory: Negative.   Cardiovascular: Negative.   Gastrointestinal: Negative.   Endocrine: Negative.   Genitourinary: Negative.   Musculoskeletal: Negative.   Skin: Negative.   Allergic/Immunologic: Negative.   Neurological:  Negative.   Hematological: Negative.   Psychiatric/Behavioral: Negative.     Social History  Substance Use Topics  . Smoking status: Never Smoker  . Smokeless tobacco: Never Used  . Alcohol use 1.8 oz/week    3 Glasses of wine per week     Comment: OCCASIONAL   Objective:   BP 132/72 (BP Location: Right Arm, Patient Position: Sitting, Cuff Size: Normal)   Pulse 84   Temp 97.5 F (36.4 C) (Oral)   Resp 16   Wt 161 lb (73 kg)   BMI 26.79 kg/m   Physical Exam  Constitutional: She appears well-developed and well-nourished. No distress.  Neck: Normal range of motion. Neck supple. No JVD present. No tracheal deviation present. No thyromegaly present.  Cardiovascular: Normal rate, regular rhythm and normal heart sounds.  Exam reveals no gallop and no friction rub.   No murmur heard. Pulmonary/Chest: Effort normal and breath sounds normal. No respiratory distress. She has no wheezes. She has no rales.  Lymphadenopathy:    She has no cervical adenopathy.  Skin: She is not diaphoretic.  Vitals reviewed.     Assessment & Plan:     1. Essential (primary) hypertension Stable. Continue current medical treatment plan. I will see her back in 3-4 months for recheck and to check all labs.  2. Clinical depression She is doing well. She has had multiple stressors but feels she is doing well emotionally.       Mar Daring, PA-C  [facility]

## 2016-01-01 DIAGNOSIS — Z9889 Other specified postprocedural states: Secondary | ICD-10-CM | POA: Diagnosis not present

## 2016-01-01 DIAGNOSIS — I10 Essential (primary) hypertension: Secondary | ICD-10-CM | POA: Diagnosis not present

## 2016-01-01 DIAGNOSIS — R0602 Shortness of breath: Secondary | ICD-10-CM | POA: Diagnosis not present

## 2016-01-01 DIAGNOSIS — R079 Chest pain, unspecified: Secondary | ICD-10-CM | POA: Diagnosis not present

## 2016-01-10 DIAGNOSIS — H353212 Exudative age-related macular degeneration, right eye, with inactive choroidal neovascularization: Secondary | ICD-10-CM | POA: Diagnosis not present

## 2016-01-16 ENCOUNTER — Other Ambulatory Visit: Payer: Self-pay | Admitting: Family Medicine

## 2016-01-16 DIAGNOSIS — I10 Essential (primary) hypertension: Secondary | ICD-10-CM

## 2016-01-29 DIAGNOSIS — H401131 Primary open-angle glaucoma, bilateral, mild stage: Secondary | ICD-10-CM | POA: Diagnosis not present

## 2016-03-12 ENCOUNTER — Ambulatory Visit (INDEPENDENT_AMBULATORY_CARE_PROVIDER_SITE_OTHER): Payer: Medicare Other | Admitting: Physician Assistant

## 2016-03-12 ENCOUNTER — Encounter: Payer: Self-pay | Admitting: Physician Assistant

## 2016-03-12 VITALS — BP 120/70 | HR 80 | Temp 97.8°F | Resp 16 | Wt 163.0 lb

## 2016-03-12 DIAGNOSIS — E7800 Pure hypercholesterolemia, unspecified: Secondary | ICD-10-CM

## 2016-03-12 DIAGNOSIS — R238 Other skin changes: Secondary | ICD-10-CM | POA: Diagnosis not present

## 2016-03-12 DIAGNOSIS — I10 Essential (primary) hypertension: Secondary | ICD-10-CM

## 2016-03-12 DIAGNOSIS — I739 Peripheral vascular disease, unspecified: Secondary | ICD-10-CM

## 2016-03-12 DIAGNOSIS — R5383 Other fatigue: Secondary | ICD-10-CM | POA: Diagnosis not present

## 2016-03-12 DIAGNOSIS — R233 Spontaneous ecchymoses: Secondary | ICD-10-CM

## 2016-03-12 DIAGNOSIS — R739 Hyperglycemia, unspecified: Secondary | ICD-10-CM | POA: Diagnosis not present

## 2016-03-12 LAB — POCT URINALYSIS DIPSTICK
BILIRUBIN UA: NEGATIVE
Blood, UA: NEGATIVE
Glucose, UA: NEGATIVE
KETONES UA: NEGATIVE
LEUKOCYTES UA: NEGATIVE
NITRITE UA: NEGATIVE
PH UA: 6
PROTEIN UA: NEGATIVE
Spec Grav, UA: 1.02
Urobilinogen, UA: 0.2

## 2016-03-12 NOTE — Progress Notes (Signed)
Patient: Jordan Gordon Female    DOB: May 15, 1934   82 y.o.   MRN: [id number] Visit Date: 03/12/2016  Today's Provider: Mar Daring, PA-C   Chief Complaint  Patient presents with  . Fatigue  . Extremity Weakness  . Back Pain   Subjective:    HPI Patient c/o of worsening fatigue, leg pain and back pain for the last 5 months. Patient reports that symptoms are worse as the day goes on. Patient reports that leg pain increases with activity. Patient reports she takes Aleve 2 tablets daily, reports mild relief. Patient reports that she has seen Dr. Josefa Half in July and that he was not concerned. Patient is requesting to have some labs checked.   She does have chronic back issues and has been seen for this in the past. She states this feels different than the back. Her legs she points to the ischial tuberosity on the left leg where the pain bothers her most. It never radiates completely down the leg. She complains more of fatiguing of the legs with walking. She reports they both fatigue about equally. She denies cramping. States she can walk to her mailbox and they will fatigue so bad on her way back she just has to sit down when she gets in the house.    Allergies  Allergen Reactions  . Codeine     nausea  . No Known Allergies      Current Outpatient Prescriptions:  .  ALPRAZolam (XANAX) 0.5 MG tablet, Take 1 tablet (0.5 mg total) by mouth 2 (two) times daily., Disp: 60 tablet, Rfl: 5 .  Cyanocobalamin (RA VITAMIN B-12 TR) 1000 MCG TBCR, Take by mouth., Disp: , Rfl:  .  LUMIGAN 0.01 % SOLN, , Disp: , Rfl:  .  naproxen sodium (ANAPROX) 220 MG tablet, Take 220 mg by mouth 2 (two) times daily with a meal., Disp: , Rfl:  .  potassium chloride SA (K-DUR,KLOR-CON) 20 MEQ tablet, Take 1 tablet (20 mEq total) by mouth daily., Disp: 180 tablet, Rfl: 3 .  sertraline (ZOLOFT) 100 MG tablet, Take 2 tablets (200 mg total) by mouth daily., Disp: 180 tablet, Rfl: 3 .  timolol (TIMOPTIC)  0.5 % ophthalmic solution, , Disp: , Rfl:  .  tobramycin-dexamethasone (TOBRADEX) ophthalmic solution, , Disp: , Rfl:  .  traZODone (DESYREL) 100 MG tablet, Take 1 tablet (100 mg total) by mouth at bedtime., Disp: 90 tablet, Rfl: 3 .  triamterene-hydrochlorothiazide (MAXZIDE-25) 37.5-25 MG tablet, TAKE ONE TABLET EVERY DAY, Disp: 90 tablet, Rfl: 3  Review of Systems  Constitutional: Positive for fatigue.  Respiratory: Positive for shortness of breath.   Cardiovascular: Negative.   Musculoskeletal: Positive for back pain and myalgias.  Hematological: Bruises/bleeds easily.    Social History  Substance Use Topics  . Smoking status: Never Smoker  . Smokeless tobacco: Never Used  . Alcohol use 1.8 oz/week    3 Glasses of wine per week     Comment: OCCASIONAL   Objective:   There were no vitals taken for this visit.  Physical Exam  Constitutional: She appears well-developed and well-nourished. No distress.  Neck: Normal range of motion. Neck supple. No JVD present. No tracheal deviation present. No thyromegaly present.  Cardiovascular: Normal rate, regular rhythm and normal heart sounds.  Exam reveals no gallop and no friction rub.   No murmur heard. Pedal pulses not well palpated but foot warm, no rest pain  Pulmonary/Chest: Effort normal and breath sounds  normal. No respiratory distress. She has no wheezes. She has no rales.  Musculoskeletal: She exhibits no edema.  Lymphadenopathy:    She has no cervical adenopathy.  Skin: She is not diaphoretic.  Vitals reviewed.     Assessment & Plan:     1. Fatigue, unspecified type Ua was normal. Will check labs to make sure not anemic or change in labs. Will follow up pending labs. - POCT Urinalysis Dipstick - CBC w/Diff/Platelet  2. Claudication of both lower extremities ([place]) Will check ABI since pulses not well felt and possible claudication symptoms. Could be neurogenic claudication, but want to r/o vascular first since that is  limb threatening. - Ankle Brachial Index Assessment (BFP)  3. Essential (primary) hypertension Stable. Will check labs as below and f/u pending results. - CBC w/Diff/Platelet - Comprehensive Metabolic Panel (CMET)  4. Pure hypercholesterolemia Will check labs as below and f/u pending results. - Lipid Profile  5. Blood glucose elevated Will check labs as below and f/u pending results. - Comprehensive Metabolic Panel (CMET) - HgB A1c  6. Bruises easily Checking for anemia.  - CBC w/Diff/Platelet - Comprehensive Metabolic Panel (CMET)       Mar Daring, PA-C  [facility]

## 2016-03-12 NOTE — Patient Instructions (Signed)
Fatigue  Fatigue is feeling tired all of the time, a lack of energy, or a lack of motivation. Occasional or mild fatigue is often a normal response to activity or life in general. However, long-lasting (chronic) or extreme fatigue Mullane indicate an underlying medical condition.  HOME CARE INSTRUCTIONS   Watch your fatigue for any changes. The following actions Mccune help to lessen any discomfort you are feeling:  · Talk to your health care provider about how much sleep you need each night. Try to get the required amount every night.  · Take medicines only as directed by your health care provider.  · Eat a healthy and nutritious diet. Ask your health care provider if you need help changing your diet.  · Drink enough fluid to keep your urine clear or pale yellow.  · Practice ways of relaxing, such as yoga, meditation, massage therapy, or acupuncture.  · Exercise regularly.    · Change situations that cause you stress. Try to keep your work and personal routine reasonable.  · Do not abuse illegal drugs.  · Limit alcohol intake to no more than 1 drink per day for nonpregnant women and 2 drinks per day for men. One drink equals 12 ounces of beer, 5 ounces of wine, or 1½ ounces of hard liquor.  · Take a multivitamin, if directed by your health care provider.  SEEK MEDICAL CARE IF:   · Your fatigue does not get better.  · You have a fever.    · You have unintentional weight loss or gain.  · You have headaches.    · You have difficulty:      Falling asleep.    Sleeping throughout the night.  · You feel angry, guilty, anxious, or sad.     · You are unable to have a bowel movement (constipation).    · You skin is dry.     · Your legs or another part of your body is swollen.    SEEK IMMEDIATE MEDICAL CARE IF:   · You feel confused.    · Your vision is blurry.  · You feel faint or pass out.    · You have a severe headache.    · You have severe abdominal, pelvic, or back pain.    · You have chest pain, shortness of breath, or an  irregular or fast heartbeat.    · You are unable to urinate or you urinate less than normal.    · You develop abnormal bleeding, such as bleeding from the rectum, vagina, nose, lungs, or nipples.  · You vomit blood.     · You have thoughts about harming yourself or committing suicide.    · You are worried that you might harm someone else.       This information is not intended to replace advice given to you by your health care provider. Make sure you discuss any questions you have with your health care provider.     Document Released: 03/22/2007 Document Revised: 06/15/2014 Document Reviewed: 09/26/2013  Elsevier Interactive Patient Education ©2016 Elsevier Inc.

## 2016-03-13 DIAGNOSIS — I10 Essential (primary) hypertension: Secondary | ICD-10-CM | POA: Diagnosis not present

## 2016-03-13 DIAGNOSIS — E7800 Pure hypercholesterolemia, unspecified: Secondary | ICD-10-CM | POA: Diagnosis not present

## 2016-03-13 DIAGNOSIS — R739 Hyperglycemia, unspecified: Secondary | ICD-10-CM | POA: Diagnosis not present

## 2016-03-13 DIAGNOSIS — R238 Other skin changes: Secondary | ICD-10-CM | POA: Diagnosis not present

## 2016-03-13 DIAGNOSIS — R5383 Other fatigue: Secondary | ICD-10-CM | POA: Diagnosis not present

## 2016-03-14 LAB — LIPID PANEL
CHOL/HDL RATIO: 4.7 ratio — AB (ref 0.0–4.4)
CHOLESTEROL TOTAL: 255 mg/dL — AB (ref 100–199)
HDL: 54 mg/dL (ref >39)
LDL CALC: 173 mg/dL — AB (ref 0–99)
TRIGLYCERIDES: 139 mg/dL (ref 0–149)
VLDL Cholesterol Cal: 28 mg/dL (ref 5–40)

## 2016-03-14 LAB — CBC WITH DIFFERENTIAL/PLATELET
BASOS: 1 %
Basophils Absolute: 0 10*3/uL (ref 0.0–0.2)
EOS (ABSOLUTE): 0.1 10*3/uL (ref 0.0–0.4)
Eos: 2 %
HEMATOCRIT: 38 % (ref 34.0–46.6)
HEMOGLOBIN: 13.5 g/dL (ref 11.1–15.9)
IMMATURE GRANS (ABS): 0 10*3/uL (ref 0.0–0.1)
IMMATURE GRANULOCYTES: 0 %
LYMPHS: 39 %
Lymphocytes Absolute: 1.5 10*3/uL (ref 0.7–3.1)
MCH: 31 pg (ref 26.6–33.0)
MCHC: 35.5 g/dL (ref 31.5–35.7)
MCV: 87 fL (ref 79–97)
MONOCYTES: 8 %
MONOS ABS: 0.3 10*3/uL (ref 0.1–0.9)
NEUTROS PCT: 50 %
Neutrophils Absolute: 2 10*3/uL (ref 1.4–7.0)
Platelets: 188 10*3/uL (ref 150–379)
RBC: 4.35 x10E6/uL (ref 3.77–5.28)
RDW: 13.2 % (ref 12.3–15.4)
WBC: 3.9 10*3/uL (ref 3.4–10.8)

## 2016-03-14 LAB — COMPREHENSIVE METABOLIC PANEL
A/G RATIO: 1.8 (ref 1.2–2.2)
ALBUMIN: 4.1 g/dL (ref 3.5–4.7)
ALT: 24 IU/L (ref 0–32)
AST: 26 IU/L (ref 0–40)
Alkaline Phosphatase: 63 IU/L (ref 39–117)
BUN/Creatinine Ratio: 28 (ref 12–28)
BUN: 23 mg/dL (ref 8–27)
Bilirubin Total: 0.3 mg/dL (ref 0.0–1.2)
CALCIUM: 9 mg/dL (ref 8.7–10.3)
CHLORIDE: 102 mmol/L (ref 96–106)
CO2: 25 mmol/L (ref 18–29)
CREATININE: 0.82 mg/dL (ref 0.57–1.00)
GFR, EST AFRICAN AMERICAN: 78 mL/min/{1.73_m2} (ref >59)
GFR, EST NON AFRICAN AMERICAN: 68 mL/min/{1.73_m2} (ref >59)
GLOBULIN, TOTAL: 2.3 g/dL (ref 1.5–4.5)
Glucose: 103 mg/dL — ABNORMAL HIGH (ref 65–99)
POTASSIUM: 3.8 mmol/L (ref 3.5–5.2)
SODIUM: 143 mmol/L (ref 134–144)
TOTAL PROTEIN: 6.4 g/dL (ref 6.0–8.5)

## 2016-03-14 LAB — HEMOGLOBIN A1C
Est. average glucose Bld gHb Est-mCnc: 111 mg/dL
HEMOGLOBIN A1C: 5.5 % (ref 4.8–5.6)

## 2016-03-16 ENCOUNTER — Telehealth: Payer: Self-pay

## 2016-03-16 NOTE — Telephone Encounter (Signed)
-----   Message from Mar Daring, PA-C sent at 03/14/2016  9:46 AM EDT ----- All labs are within normal limits and stable. HgBA1c improved to 5.5 from 6.0! Cholesterol is stable, but HDL improved greatly which helps offer cardioprotection.  Thanks! -JB

## 2016-03-16 NOTE — Telephone Encounter (Signed)
Advised pt of lab results. Pt verbally acknowledges understanding. Novalee Horsfall Drozdowski, CMA   

## 2016-03-18 ENCOUNTER — Telehealth: Payer: Self-pay

## 2016-03-18 ENCOUNTER — Ambulatory Visit (INDEPENDENT_AMBULATORY_CARE_PROVIDER_SITE_OTHER): Payer: Medicare Other

## 2016-03-18 ENCOUNTER — Encounter: Payer: Self-pay | Admitting: Physician Assistant

## 2016-03-18 DIAGNOSIS — I739 Peripheral vascular disease, unspecified: Secondary | ICD-10-CM

## 2016-03-18 DIAGNOSIS — Z23 Encounter for immunization: Secondary | ICD-10-CM | POA: Diagnosis not present

## 2016-03-18 LAB — POCT ANKLE BRACHIAL INDEX ASSESSMENT (BFP)
IMABIR: 1.05
Immediate ABI left: 0.98

## 2016-03-18 NOTE — Telephone Encounter (Signed)
-----   Message from Mar Daring, PA-C sent at 03/18/2016 10:00 AM EDT ----- Please notify patient that ABI is normal incase she has not been notified yet. The claudication symptoms are most likely from her back and nerve pain issues that she has been having.

## 2016-03-18 NOTE — Telephone Encounter (Signed)
Patient advised.

## 2016-03-18 NOTE — Progress Notes (Signed)
Patient came into office today for ABI reason for screening patient stated that she had leg pain and heel pain.

## 2016-03-18 NOTE — Telephone Encounter (Signed)
LMTCB

## 2016-03-18 NOTE — Telephone Encounter (Signed)
Pt returned your call ° °Jordan Gordon °

## 2016-03-20 NOTE — Progress Notes (Signed)
This patient was seen this week for ABI can you please review and sign off on chart?

## 2016-04-08 DIAGNOSIS — H353212 Exudative age-related macular degeneration, right eye, with inactive choroidal neovascularization: Secondary | ICD-10-CM | POA: Diagnosis not present

## 2016-04-10 DIAGNOSIS — M5416 Radiculopathy, lumbar region: Secondary | ICD-10-CM | POA: Diagnosis not present

## 2016-04-10 DIAGNOSIS — M5441 Lumbago with sciatica, right side: Secondary | ICD-10-CM | POA: Diagnosis not present

## 2016-04-10 DIAGNOSIS — M5442 Lumbago with sciatica, left side: Secondary | ICD-10-CM | POA: Diagnosis not present

## 2016-05-04 ENCOUNTER — Encounter: Payer: Self-pay | Admitting: Physician Assistant

## 2016-05-04 ENCOUNTER — Ambulatory Visit (INDEPENDENT_AMBULATORY_CARE_PROVIDER_SITE_OTHER): Payer: Medicare Other | Admitting: Physician Assistant

## 2016-05-04 VITALS — BP 110/92 | HR 80 | Temp 97.7°F | Resp 16 | Wt 163.0 lb

## 2016-05-04 DIAGNOSIS — E7800 Pure hypercholesterolemia, unspecified: Secondary | ICD-10-CM | POA: Diagnosis not present

## 2016-05-04 DIAGNOSIS — F3289 Other specified depressive episodes: Secondary | ICD-10-CM | POA: Diagnosis not present

## 2016-05-04 DIAGNOSIS — I10 Essential (primary) hypertension: Secondary | ICD-10-CM | POA: Diagnosis not present

## 2016-05-04 NOTE — Progress Notes (Signed)
Patient: Jordan Gordon Female    DOB: 28-Jun-1933   82 y.o.   MRN: [id number] Visit Date: 05/04/2016  Today's Provider: Mar Daring, PA-C   Chief Complaint  Patient presents with  . Hypertension  . Hyperlipidemia  . Depression   Subjective:    HPI  Hypertension, follow-up:  BP Readings from Last 3 Encounters:  05/04/16 (!) 110/92  03/12/16 120/70  12/30/15 132/72    She was last seen for hypertension 4 months ago.  BP at that visit was 120/70. Management changes since that visit include no changes. She reports excellent compliance with treatment. She is not having side effects.  She is not exercising. She is not adherent to low salt diet.   Outside blood pressures are stable. She is experiencing none.  Patient denies chest pain and lower extremity edema.   Cardiovascular risk factors include advanced age (older than 74 for men, 103 for women) and hypertension.  Use of agents associated with hypertension: none.     Weight trend: stable Wt Readings from Last 3 Encounters:  05/04/16 163 lb (73.9 kg)  03/12/16 163 lb (73.9 kg)  12/30/15 161 lb (73 kg)    Current diet: in general, a "healthy" diet    ------------------------------------------------------------------------   Lipid/Cholesterol, Follow-up:   Last seen for this4 months ago.  Management changes since that visit include no changes. . Last Lipid Panel:    Component Value Date/Time   CHOL 255 (H) 03/13/2016 0804   TRIG 139 03/13/2016 0804   HDL 54 03/13/2016 0804   CHOLHDL 4.7 (H) 03/13/2016 0804   LDLCALC 173 (H) 03/13/2016 0804    Risk factors for vascular disease include hypertension  She reports excellent compliance with treatment. She is not having side effects.  Current symptoms include none and have been stable. Weight trend: stable Prior visit with dietician: no Current diet: in general, a "healthy" diet   Current exercise: none  Wt Readings from Last 3 Encounters:    05/04/16 163 lb (73.9 kg)  03/12/16 163 lb (73.9 kg)  12/30/15 161 lb (73 kg)    -------------------------------------------------------------------  Depression, Follow-up  She  was last seen for this 4 months ago. Changes made at last visit include no changes.   She reports excellent compliance with treatment. She is not having side effects.   She reports excellent tolerance of treatment. Current symptoms include: fatigue She feels she is Unchanged since last visit.  ------------------------------------------------------------------------     Allergies  Allergen Reactions  . Codeine     nausea  . No Known Allergies      Current Outpatient Prescriptions:  .  ALPRAZolam (XANAX) 0.5 MG tablet, Take 1 tablet (0.5 mg total) by mouth 2 (two) times daily., Disp: 60 tablet, Rfl: 5 .  potassium chloride SA (K-DUR,KLOR-CON) 20 MEQ tablet, Take 1 tablet (20 mEq total) by mouth daily., Disp: 180 tablet, Rfl: 3 .  sertraline (ZOLOFT) 100 MG tablet, Take 2 tablets (200 mg total) by mouth daily., Disp: 180 tablet, Rfl: 3 .  timolol (TIMOPTIC) 0.5 % ophthalmic solution, Place 1 drop into both eyes. , Disp: , Rfl:  .  tobramycin-dexamethasone (TOBRADEX) ophthalmic solution, , Disp: , Rfl:  .  traZODone (DESYREL) 100 MG tablet, Take 1 tablet (100 mg total) by mouth at bedtime., Disp: 90 tablet, Rfl: 3 .  triamterene-hydrochlorothiazide (MAXZIDE-25) 37.5-25 MG tablet, TAKE ONE TABLET EVERY DAY, Disp: 90 tablet, Rfl: 3 .  LUMIGAN 0.01 % SOLN, Place 1  drop into both eyes at bedtime. , Disp: , Rfl:  .  naproxen sodium (ANAPROX) 220 MG tablet, Take 220 mg by mouth 2 (two) times daily with a meal., Disp: , Rfl:   Review of Systems  Constitutional: Positive for fatigue.  HENT: Negative.   Eyes: Negative for visual disturbance.  Respiratory: Negative.   Cardiovascular: Negative.   Gastrointestinal: Negative.   Endocrine: Negative.   Musculoskeletal: Positive for arthralgias and back pain.   Neurological: Negative.     Social History  Substance Use Topics  . Smoking status: Never Smoker  . Smokeless tobacco: Never Used  . Alcohol use 1.8 oz/week    3 Glasses of wine per week     Comment: OCCASIONAL   Objective:   BP (!) 110/92 (BP Location: Left Arm, Patient Position: Sitting, Cuff Size: Large)   Pulse 80   Temp 97.7 F (36.5 C) (Oral)   Resp 16   Wt 163 lb (73.9 kg)   BMI 27.12 kg/m   Physical Exam  Constitutional: She appears well-developed and well-nourished. No distress.  Neck: Normal range of motion. Neck supple. No tracheal deviation present. No thyromegaly present.  Cardiovascular: Normal rate, regular rhythm and normal heart sounds.  Exam reveals no gallop and no friction rub.   No murmur heard. Pulmonary/Chest: Effort normal and breath sounds normal. No respiratory distress. She has no wheezes. She has no rales.  Lymphadenopathy:    She has no cervical adenopathy.  Skin: She is not diaphoretic.  Vitals reviewed.     Assessment & Plan:     1. Essential (primary) hypertension Stable. Continue current medical treatment plan of Maxide 37.5-25 mg daily. She is due for her annual wellness visit in January 2018.  2. Pure hypercholesterolemia Stable. Labs in October were stable.  3. Other depression Stable. Continue sertraline 200 mg daily.       Mar Daring, PA-C  [facility]

## 2016-05-04 NOTE — Patient Instructions (Signed)
Hypertension Hypertension, commonly called high blood pressure, is when the force of blood pumping through your arteries is too strong. Your arteries are the blood vessels that carry blood from your heart throughout your body. A blood pressure reading consists of a higher number over a lower number, such as 110/72. The higher number (systolic) is the pressure inside your arteries when your heart pumps. The lower number (diastolic) is the pressure inside your arteries when your heart relaxes. Ideally you want your blood pressure below 120/80. Hypertension forces your heart to work harder to pump blood. Your arteries Wrigley become narrow or stiff. Having untreated or uncontrolled hypertension can cause heart attack, stroke, kidney disease, and other problems. What increases the risk? Some risk factors for high blood pressure are controllable. Others are not. Risk factors you cannot control include:  Race. You Ricklefs be at higher risk if you are African American.  Age. Risk increases with age.  Gender. Men are at higher risk than women before age 45 years. After age 65, women are at higher risk than men. Risk factors you can control include:  Not getting enough exercise or physical activity.  Being overweight.  Getting too much fat, sugar, calories, or salt in your diet.  Drinking too much alcohol. What are the signs or symptoms? Hypertension does not usually cause signs or symptoms. Extremely high blood pressure (hypertensive crisis) Ech[place]arria cause headache, anxiety, shortness of breath, and nosebleed. How is this diagnosed? To check if you have hypertension, your health care provider will measure your blood pressure while you are seated, with your arm held at the level of your heart. It should be measured at least twice using the same arm. Certain conditions can cause a difference in blood pressure between your right and left arms. A blood pressure reading that is higher than normal on one occasion does  not mean that you need treatment. If it is not clear whether you have high blood pressure, you Brueggemann be asked to return on a different day to have your blood pressure checked again. Or, you Supak be asked to monitor your blood pressure at home for 1 or more weeks. How is this treated? Treating high blood pressure includes making lifestyle changes and possibly taking medicine. Living a healthy lifestyle can help lower high blood pressure. You Brenton need to change some of your habits. Lifestyle changes Lank include:  Following the DASH diet. This diet is high in fruits, vegetables, and whole grains. It is low in salt, red meat, and added sugars.  Keep your sodium intake below 2,300 mg per day.  Getting at least 30-45 minutes of aerobic exercise at least 4 times per week.  Losing weight if necessary.  Not smoking.  Limiting alcoholic beverages.  Learning ways to reduce stress. Your health care provider Bangerter prescribe medicine if lifestyle changes are not enough to get your blood pressure under control, and if one of the following is true:  You are 18-59 years of age and your systolic blood pressure is above 140.  You are 60 years of age or older, and your systolic blood pressure is above 150.  Your diastolic blood pressure is above 90.  You have diabetes, and your systolic blood pressure is over 140 or your diastolic blood pressure is over 90.  You have kidney disease and your blood pressure is above 140/90.  You have heart disease and your blood pressure is above 140/90. Your personal target blood pressure [place] vary depending on your medical   conditions, your age, and other factors. Follow these instructions at home:  Have your blood pressure rechecked as directed by your health care provider.  Take medicines only as directed by your health care provider. Follow the directions carefully. Blood pressure medicines must be taken as prescribed. The medicine does not work as well when you skip  doses. Skipping doses also puts you at risk for problems.  Do not smoke.  Monitor your blood pressure at home as directed by your health care provider. Contact a health care provider if:  You think you are having a reaction to medicines taken.  You have recurrent headaches or feel dizzy.  You have swelling in your ankles.  You have trouble with your vision. Get help right away if:  You develop a severe headache or confusion.  You have unusual weakness, numbness, or feel faint.  You have severe chest or abdominal pain.  You vomit repeatedly.  You have trouble breathing. This information is not intended to replace advice given to you by your health care provider. Make sure you discuss any questions you have with your health care provider. Document Released: 05/25/2005 Document Revised: 10/31/2015 Document Reviewed: 03/17/2013 Elsevier Interactive Patient Education  2017 Elsevier Inc.  

## 2016-05-12 ENCOUNTER — Other Ambulatory Visit: Payer: Self-pay

## 2016-05-12 DIAGNOSIS — F419 Anxiety disorder, unspecified: Secondary | ICD-10-CM

## 2016-05-12 MED ORDER — ALPRAZOLAM 0.5 MG PO TABS: 0.5000 mg | ORAL_TABLET | Freq: Two times a day (BID) | ORAL | 5 refills | Status: DC

## 2016-05-12 NOTE — Telephone Encounter (Signed)
Called into [facility]. Renaldo Fiddler, CMA

## 2016-05-26 ENCOUNTER — Telehealth: Payer: Self-pay | Admitting: Physician Assistant

## 2016-05-26 DIAGNOSIS — J0140 Acute pansinusitis, unspecified: Secondary | ICD-10-CM

## 2016-05-26 MED ORDER — AMOXICILLIN-POT CLAVULANATE 875-125 MG PO TABS: 1.0000 | ORAL_TABLET | Freq: Two times a day (BID) | ORAL | 0 refills | Status: DC

## 2016-05-26 NOTE — Telephone Encounter (Signed)
Pt reports she has had these sx since last Friday. Denies fever, facial/teeth pain. Is c/o hoarseness, PND, dry cough, congestion. Has tried OTC allergy medication, without relief. Renaldo Fiddler, CMA

## 2016-05-26 NOTE — Telephone Encounter (Signed)
Pt is requesting an Antibiotic sent to [facility] b/c she thinks she has a sinus infection. Pt stated that she has cough & congestion. Pt wanted to see if she could get an Rx without an OV b/c [place] doesn't have any openings today and she stated isn't feeling well to come in the office. Please advise. Thanks TNP

## 2016-05-26 NOTE — Telephone Encounter (Signed)
Augmentin sent to [facility] 

## 2016-05-26 NOTE — Telephone Encounter (Signed)
Pt advised. Emily Drozdowski, CMA  

## 2016-05-29 ENCOUNTER — Other Ambulatory Visit: Payer: Self-pay | Admitting: Family Medicine

## 2016-05-29 DIAGNOSIS — G4700 Insomnia, unspecified: Secondary | ICD-10-CM

## 2016-05-29 NOTE — Telephone Encounter (Signed)
Last ov 05/04/16. Last filled 09/12/15. Please review. sd

## 2016-06-16 DIAGNOSIS — R0602 Shortness of breath: Secondary | ICD-10-CM | POA: Diagnosis not present

## 2016-06-16 DIAGNOSIS — E7800 Pure hypercholesterolemia, unspecified: Secondary | ICD-10-CM | POA: Diagnosis not present

## 2016-06-16 DIAGNOSIS — Z9889 Other specified postprocedural states: Secondary | ICD-10-CM | POA: Diagnosis not present

## 2016-06-16 DIAGNOSIS — R079 Chest pain, unspecified: Secondary | ICD-10-CM | POA: Diagnosis not present

## 2016-06-16 DIAGNOSIS — I10 Essential (primary) hypertension: Secondary | ICD-10-CM | POA: Diagnosis not present

## 2016-07-01 DIAGNOSIS — H353212 Exudative age-related macular degeneration, right eye, with inactive choroidal neovascularization: Secondary | ICD-10-CM | POA: Diagnosis not present

## 2016-07-07 DIAGNOSIS — H353212 Exudative age-related macular degeneration, right eye, with inactive choroidal neovascularization: Secondary | ICD-10-CM | POA: Diagnosis not present

## 2016-07-07 DIAGNOSIS — H353122 Nonexudative age-related macular degeneration, left eye, intermediate dry stage: Secondary | ICD-10-CM | POA: Diagnosis not present

## 2016-07-09 ENCOUNTER — Ambulatory Visit (INDEPENDENT_AMBULATORY_CARE_PROVIDER_SITE_OTHER): Payer: Medicare Other

## 2016-07-09 ENCOUNTER — Ambulatory Visit (INDEPENDENT_AMBULATORY_CARE_PROVIDER_SITE_OTHER): Payer: Medicare Other | Admitting: Physician Assistant

## 2016-07-09 VITALS — BP 131/76 | HR 76 | Temp 98.0°F | Ht 65.0 in | Wt 163.0 lb

## 2016-07-09 DIAGNOSIS — Z0000 Encounter for general adult medical examination without abnormal findings: Secondary | ICD-10-CM

## 2016-07-09 DIAGNOSIS — E7800 Pure hypercholesterolemia, unspecified: Secondary | ICD-10-CM

## 2016-07-09 DIAGNOSIS — I10 Essential (primary) hypertension: Secondary | ICD-10-CM

## 2016-07-09 DIAGNOSIS — R739 Hyperglycemia, unspecified: Secondary | ICD-10-CM | POA: Diagnosis not present

## 2016-07-09 DIAGNOSIS — M5432 Sciatica, left side: Secondary | ICD-10-CM

## 2016-07-09 NOTE — Progress Notes (Signed)
Subjective:   Jordan Gordon is a 83 y.o. female who presents for Medicare Annual (Subsequent) preventive examination.  Review of Systems:  N/A  Cardiac Risk Factors include: advanced age (>18men, >29 women);dyslipidemia;hypertension     Objective:     Vitals: BP 131/76 (BP Location: Right Arm)   Pulse 76   Temp 98 F (36.7 C) (Oral)   Ht 5\' 5"  (1.651 m)   Wt 163 lb (73.9 kg)   BMI 27.12 kg/m   Body mass index is 27.12 kg/m.   Tobacco History  Smoking Status  . Never Smoker  Smokeless Tobacco  . Never Used     Counseling given: Not Answered   Past Medical History:  Diagnosis Date  . Anxiety   . Hyperlipidemia   . Hypertension   . Major depressive disorder    Past Surgical History:  Procedure Laterality Date  . ABDOMINAL HYSTERECTOMY  1979  . BREAST EXCISIONAL BIOPSY Left 2005   benign  . COLONOSCOPY WITH PROPOFOL N/A 07/22/2015   Procedure: COLONOSCOPY WITH PROPOFOL;  Surgeon: Manya Silvas, MD;  Location: [facility] ENDOSCOPY;  Service: Endoscopy;  Laterality: N/A;  . KNEE SURGERY Left 2011   2007  . TONSILLECTOMY  1940   ADENOIDS   Family History  Problem Relation Age of Onset  . Cancer Mother   . Heart disease Father   . Cancer Brother    History  Sexual Activity  . Sexual activity: Not on file    Outpatient Encounter Prescriptions as of 07/09/2016  Medication Sig  . ALPRAZolam (XANAX) 0.5 MG tablet Take 1 tablet (0.5 mg total) by mouth 2 (two) times daily.  Marland Kitchen LUMIGAN 0.01 % SOLN Place 1 drop into both eyes at bedtime.   . naproxen sodium (ANAPROX) 220 MG tablet Take 220 mg by mouth 2 (two) times daily with a meal.  . potassium chloride SA (K-DUR,KLOR-CON) 20 MEQ tablet Take 1 tablet (20 mEq total) by mouth daily. (Patient taking differently: Take 20 mEq by mouth daily. )  . Pseudoephedrine HCl (SINUS & ALLERGY 12 HOUR PO) Take by mouth.  . sertraline (ZOLOFT) 100 MG tablet Take 2 tablets (200 mg total) by mouth daily.  . timolol (TIMOPTIC) 0.5 %  ophthalmic solution Place 1 drop into both eyes.   Marland Kitchen tobramycin-dexamethasone (TOBRADEX) ophthalmic solution   . traZODone (DESYREL) 100 MG tablet TAKE ONE TABLET AT BEDTIME  . triamterene-hydrochlorothiazide (MAXZIDE-25) 37.5-25 MG tablet TAKE ONE TABLET EVERY DAY  . amoxicillin-clavulanate (AUGMENTIN) 875-125 MG tablet Take 1 tablet by mouth 2 (two) times daily. (Patient not taking: Reported on 07/09/2016)   No facility-administered encounter medications on file as of 07/09/2016.     Activities of Daily Living In your present state of health, do you have any difficulty performing the following activities: 07/09/2016  Hearing? Y  Vision? Y  Difficulty concentrating or making decisions? N  Walking or climbing stairs? N  Dressing or bathing? N  Doing errands, shopping? N  Preparing Food and eating ? N  Using the Toilet? N  In the past six months, have you accidently leaked urine? N  Do you have problems with loss of bowel control? N  Managing your Medications? N  Managing your Finances? N  Housekeeping or managing your Housekeeping? N  Some recent data might be hidden    Patient Care Team: Mar Daring, PA-C as PCP - General (Family Medicine) Estill Cotta, MD as Consulting Physician (Ophthalmology) Eulogio Bear, MD as Consulting Physician (Ophthalmology) Sheppard Coil  Paraschos, MD as Consulting Physician (Cardiology) Manya Silvas, MD as Consulting Physician (Gastroenterology)    Assessment:     Exercise Activities and Dietary recommendations Current Exercise Habits: The patient does not participate in regular exercise at present, Exercise limited by: None identified  Goals    . Exercise           Starting in spring 2018, I will start walking 3 times a week for 30 minutes.       Fall Risk Fall Risk  07/09/2016 07/09/2015  Falls in the past year? Yes No  Number falls in past yr: 1 -  Injury with Fall? No -  Follow up Falls prevention discussed -    Depression Screen PHQ 2/9 Scores 07/09/2016 07/09/2015  PHQ - 2 Score 1 0     Cognitive Function     6CIT Screen 07/09/2016  What Year? 0 points  What month? 0 points  What time? 0 points  Count back from 20 0 points  Months in reverse 2 points  Repeat phrase 6 points  Total Score 8    Immunization History  Administered Date(s) Administered  . Influenza, High Dose Seasonal PF 03/04/2015  . Influenza-Unspecified 03/18/2016  . Pneumococcal Conjugate-13 01/24/2014  . Pneumococcal Polysaccharide-23 04/18/2015  . Td 03/07/1999  . Zoster 03/24/2012   Screening Tests Health Maintenance  Topic Date Due  . TETANUS/TDAP  03/12/2017 (Originally 03/06/2009)  . DEXA SCAN  06/08/2026 (Originally 10/21/2000)  . INFLUENZA VACCINE  Completed  . ZOSTAVAX  Completed  . PNA vac Low Risk Adult  Completed      Plan:  I have personally reviewed and addressed the Medicare Annual Wellness questionnaire and have noted the following in the patient's chart:  A. Medical and social history B. Use of alcohol, tobacco or illicit drugs  C. Current medications and supplements D. Functional ability and status E.  Nutritional status F.  Physical activity G. Advance directives H. List of other physicians I.  Hospitalizations, surgeries, and ER visits in previous 12 months J.  [place] such as hearing and vision if needed, cognitive and depression L. Referrals and appointments - none  In addition, I have reviewed and discussed with patient certain preventive protocols, quality metrics, and best practice recommendations. A written personalized care plan for preventive services as well as general preventive health recommendations were provided to patient.  See attached scanned questionnaire for additional information.   Signed,  Fabio Neighbors, LPN Nurse Health Advisor   MD Recommendations: None. Pt declined DEXA referral today.   I have reviewed the documentation and information  obtained by Fabio Neighbors, LPN in the above chart and agree as above. I was available for consultation if any questions or issues arose.  Fenton Malling, PA-C

## 2016-07-09 NOTE — Progress Notes (Signed)
Patient: Jordan Gordon, Female    DOB: July 31, 1933, 83 y.o.   MRN: [id number] Visit Date: 07/09/2016  Today's Provider: Mar Daring, PA-C   No chief complaint on file.  Subjective:    Annual physical exam Jordan Gordon is a 83 y.o. female who presents today for health maintenance and complete physical. She feels well. She reports exercising none.The patient does not participate in regular exercise at present. She reports she is sleeping fairly well.  Sees Dr. Vira Agar on 07/29/16 for colonoscopy. -----------------------------------------------------------------  Essential Hypertension:The patient has essential hypertension, blood pressure well controlled, currently on triamterene/hydrochlorothiazide which is well tolerated without apparent side effects. The patient follows a low-sodium, no added salt diet. Also followed by Dr. Saralyn Pilar and saw him on 06/16/16.   Review of Systems  Constitutional: Negative.   HENT: Negative.   Eyes: Negative.   Respiratory: Negative.   Cardiovascular: Negative.   Gastrointestinal: Negative.   Endocrine: Negative.   Genitourinary: Negative.   Musculoskeletal: Negative.        Leg pain- left leg with numbness sometimes  Skin: Negative.   Allergic/Immunologic: Positive for environmental allergies.  Neurological: Negative.   Hematological: Negative.   Psychiatric/Behavioral: Negative.     Social History      She  reports that she has never smoked. She has never used smokeless tobacco. She reports that she drinks about 1.8 oz of alcohol per week . She reports that she does not use drugs.       Social History   Social History  . Marital status: Widowed    Spouse name: N/A  . Number of children: N/A  . Years of education: N/A   Social History Main Topics  . Smoking status: Never Smoker  . Smokeless tobacco: Never Used  . Alcohol use 1.8 oz/week    3 Glasses of wine per week     Comment: OCCASIONAL  . Drug use: No  . Sexual  activity: Not on file   Other Topics Concern  . Not on file   Social History Narrative  . No narrative on file    Past Medical History:  Diagnosis Date  . Anxiety   . Hyperlipidemia   . Hypertension   . Major depressive disorder      Patient Active Problem List   Diagnosis Date Noted  . Mechanical and motor problems with internal organs 04/18/2015  . Cramp in limb 04/18/2015  . Abnormal foot pulse 04/18/2015  . Clinical depression 04/18/2015  . Fatigue 04/18/2015  . Blood glucose elevated 04/18/2015  . Decreased potassium in the blood 04/18/2015  . LBP (low back pain) 04/18/2015  . Burning or prickling sensation 04/18/2015  . Esophagitis, reflux 04/18/2015  . Colon polyp 04/18/2015  . Other specified symptoms and signs involving the circulatory and respiratory systems 04/18/2015  . Anxiety 01/07/2015  . Chest pain 09/20/2014  . History of cardiac catheterization 03/08/2014  . Disorder resulting from impaired renal function 07/29/2009  . Muscle ache 03/15/2009  . Breath shortness 09/26/2008  . Adaptation reaction 07/21/2006  . Arthritis, degenerative 07/21/2006  . 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 Essential (primary) hypertension 04/16/1998  . Allergic rhinitis 03/12/1998  . Insomnia 03/04/1998  . Menopausal and perimenopausal disorder 02/02/1997  . HLD (hyperlipidemia) 12/06/1991    Past Surgical History:  Procedure Laterality Date  . ABDOMINAL HYSTERECTOMY  1979  . BREAST EXCISIONAL BIOPSY Left 2005   benign  . COLONOSCOPY WITH PROPOFOL N/A  07/22/2015   Procedure: COLONOSCOPY WITH PROPOFOL;  Surgeon: Manya Silvas, MD;  Location: [facility] ENDOSCOPY;  Service: Endoscopy;  Laterality: N/A;  . KNEE SURGERY Left 2011   2007  . TONSILLECTOMY  1940   ADENOIDS    Family History        Family Status  Relation Status  . Mother Deceased at age 23   stomach cancer  . Father Deceased at age 88's   stroke  . Sister Alive  . Brother Deceased at age  41's   prostate cancer        Her family history includes Cancer in her brother and mother; Heart disease in her father.     Allergies  Allergen Reactions  . Codeine     nausea     Current Outpatient Prescriptions:  .  ALPRAZolam (XANAX) 0.5 MG tablet, Take 1 tablet (0.5 mg total) by mouth 2 (two) times daily., Disp: 60 tablet, Rfl: 5 .  amoxicillin-clavulanate (AUGMENTIN) 875-125 MG tablet, Take 1 tablet by mouth 2 (two) times daily. (Patient not taking: Reported on 07/09/2016), Disp: 20 tablet, Rfl: 0 .  LUMIGAN 0.01 % SOLN, Place 1 drop into both eyes at bedtime. , Disp: , Rfl:  .  naproxen sodium (ANAPROX) 220 MG tablet, Take 220 mg by mouth 2 (two) times daily with a meal., Disp: , Rfl:  .  potassium chloride SA (K-DUR,KLOR-CON) 20 MEQ tablet, Take 1 tablet (20 mEq total) by mouth daily. (Patient taking differently: Take 20 mEq by mouth daily. ), Disp: 180 tablet, Rfl: 3 .  Pseudoephedrine HCl (SINUS & ALLERGY 12 HOUR PO), Take by mouth., Disp: , Rfl:  .  sertraline (ZOLOFT) 100 MG tablet, Take 2 tablets (200 mg total) by mouth daily., Disp: 180 tablet, Rfl: 3 .  timolol (TIMOPTIC) 0.5 % ophthalmic solution, Place 1 drop into both eyes. , Disp: , Rfl:  .  tobramycin-dexamethasone (TOBRADEX) ophthalmic solution, , Disp: , Rfl:  .  traZODone (DESYREL) 100 MG tablet, TAKE ONE TABLET AT BEDTIME, Disp: 90 tablet, Rfl: 1 .  triamterene-hydrochlorothiazide (MAXZIDE-25) 37.5-25 MG tablet, TAKE ONE TABLET EVERY DAY, Disp: 90 tablet, Rfl: 3   Patient Care Team: Mar Daring, PA-C as PCP - General (Family Medicine) Estill Cotta, MD as Consulting Physician (Ophthalmology) Eulogio Bear, MD as Consulting Physician (Ophthalmology) Isaias Cowman, MD as Consulting Physician (Cardiology) Manya Silvas, MD as Consulting Physician (Gastroenterology)      Objective:   Vitals: BP 131/76 (BP Location: Right Arm)   Pulse 76   Temp 98 F (36.7 C) (Oral)   Ht 5\' 5"  (1.651 m)   Wt 163 lb (73.9 kg)   BMI 27.12 kg/m   Body mass index is 27.12 kg/m.  Physical Exam  Constitutional: She is oriented to person, place, and time. She appears well-developed and well-nourished. No distress.  HENT:  Head: Normocephalic and atraumatic.  Right Ear: Tympanic membrane, external ear and ear canal normal.  Left Ear: Tympanic membrane, external ear and ear canal normal.  Nose: Nose normal. Right sinus exhibits no maxillary sinus tenderness and no frontal sinus tenderness. Left sinus exhibits no maxillary sinus tenderness and no frontal sinus tenderness.  Mouth/Throat: Uvula is midline, oropharynx is clear and moist and mucous membranes are normal. No oropharyngeal exudate, posterior oropharyngeal edema or posterior oropharyngeal erythema.  Eyes: Conjunctivae and EOM are normal. Pupils are equal, round, and reactive to light. Right eye exhibits no discharge. Left eye exhibits no discharge. No scleral icterus.  Neck:  Normal range of motion. Neck supple. No JVD present. No tracheal deviation present. No thyromegaly present.  Cardiovascular: Normal rate, regular rhythm, normal heart sounds and intact distal pulses.  Exam reveals no gallop and no friction rub.   No murmur heard. Pulmonary/Chest: Effort normal and breath sounds normal. No respiratory distress. She has no wheezes. She has no rales. She exhibits no tenderness.  Abdominal: Soft. Bowel sounds are normal. She exhibits no distension and no mass. There is no tenderness. There is no rebound and no guarding.  Musculoskeletal: Normal range of motion. She exhibits no edema or tenderness.       Right hip: Normal.       Left hip: Normal.       Lumbar back: She exhibits normal range of motion, no tenderness, no bony tenderness and no spasm.  No tenderness today but patient states when she has the left lateral upper thigh numbness sensation she normally will have gluteal pain with it  Lymphadenopathy:    She has no cervical adenopathy.    Neurological: She is alert and oriented to person, place, and time.  Skin: Skin is warm and dry. No rash noted. She is not diaphoretic.  Psychiatric: She has a normal mood and affect. Her behavior is normal. Judgment and thought content normal.  Vitals reviewed.    Depression Screen PHQ 2/9 Scores 07/09/2016 07/09/2015  PHQ - 2 Score 1 0      Assessment & Plan:     Routine Health Maintenance and Physical Exam  Exercise Activities and Dietary recommendations Goals    . Exercise           Starting in spring 2018, I will start walking 3 times a week for 30 minutes.        Immunization History  Administered Date(s) Administered  . Influenza, High Dose Seasonal PF 03/04/2015  . Influenza-Unspecified 03/18/2016  . Pneumococcal Conjugate-13 01/24/2014  . Pneumococcal Polysaccharide-23 04/18/2015  . Td 03/07/1999  . Zoster 03/24/2012    Health Maintenance  Topic Date Due  . TETANUS/TDAP  03/12/2017 (Originally 03/06/2009)  . DEXA SCAN  06/08/2026 (Originally 10/21/2000)  . INFLUENZA VACCINE  Completed  . ZOSTAVAX  Completed  . PNA vac Low Risk Adult  Completed     Discussed health benefits of physical activity, and encouraged her to engage in regular exercise appropriate for her age and condition.    1. Essential (primary) hypertension Stable. Continue current medical treatment plan. Will check labs as below and f/u pending results. - CBC with Differential - Comprehensive Metabolic Panel (CMET)  2. Blood glucose elevated Stable. Previous lab was 6.4. Continue current medical treatment plan. Will check labs as below and f/u pending results. - Comprehensive Metabolic Panel (CMET)  3. Pure hypercholesterolemia Stable. Continue current medical treatment plan. Will check labs as below and f/u pending results. - Comprehensive Metabolic Panel (CMET) - Lipid Profile  4. Sciatica of left side Suspect sciatica vs left trochanteric bursitis (no tenderness today). Using  Naproxen 220mg  prn with relief. Has previously seen Drexel Iha, PA-C following her TKR on the left and wishes to see him again for this. Peaden benefit from cortisone injection if bursitis suspected. - Ambulatory referral to Orthopedic Surgery  --------------------------------------------------------------------    Mar Daring, PA-C  [facility]

## 2016-07-09 NOTE — Patient Instructions (Signed)
Health Maintenance, Female Introduction Adopting a healthy lifestyle and getting preventive care can go a long way to promote health and wellness. Talk with your health care provider about what schedule of regular examinations is right for you. This is a good chance for you to check in with your provider about disease prevention and staying healthy. In between checkups, there are plenty of things you can do on your own. Experts have done a lot of research about which lifestyle changes and preventive measures are most likely to keep you healthy. Ask your health care provider for more information. Weight and diet Eat a healthy diet  Be sure to include plenty of vegetables, fruits, low-fat dairy products, and lean protein.  Do not eat a lot of foods high in solid fats, added sugars, or salt.  Get regular exercise. This is one of the most important things you can do for your health.  Most adults should exercise for at least 150 minutes each week. The exercise should increase your heart rate and make you sweat (moderate-intensity exercise).  Most adults should also do strengthening exercises at least twice a week. This is in addition to the moderate-intensity exercise. Maintain a healthy weight  Body mass index (BMI) is a measurement that can be used to identify possible weight problems. It estimates body fat based on height and weight. Your health care provider can help determine your BMI and help you achieve or maintain a healthy weight.  For females 4 years of age and older:  A BMI below 18.5 is considered underweight.  A BMI of 18.5 to 24.9 is normal.  A BMI of 25 to 29.9 is considered overweight.  A BMI of 30 and above is considered obese. Watch levels of cholesterol and blood lipids  You should start having your blood tested for lipids and cholesterol at 81 years of age, then have this test every 5 years.  You Eager need to have your cholesterol levels checked more often  if:  Your lipid or cholesterol levels are high.  You are older than 81 years of age.  You are at high risk for heart disease. Cancer screening Lung Cancer  Lung cancer screening is recommended for adults 12-31 years old who are at high risk for lung cancer because of a history of smoking.  A yearly low-dose CT scan of the lungs is recommended for people who:  Currently smoke.  Have quit within the past 15 years.  Have at least a 30-pack-year history of smoking. A pack year is smoking an average of one pack of cigarettes a day for 1 year.  Yearly screening should continue until it has been 15 years since you quit.  Yearly screening should stop if you develop a health problem that would prevent you from having lung cancer treatment. Breast Cancer  Practice breast self-awareness. This means understanding how your breasts normally appear and feel.  It also means doing regular breast self-exams. Let your health care provider know about any changes, no matter how small.  If you are in your 20s or 30s, you should have a clinical breast exam (CBE) by a health care provider every 1-3 years as part of a regular health exam.  If you are 74 or older, have a CBE every year. Also consider having a breast X-ray (mammogram) every year.  If you have a family history of breast cancer, talk to your health care provider about genetic screening.  If you are at high risk for breast cancer,  talk to your health care provider about having an MRI and a mammogram every year.  Breast cancer gene (BRCA) assessment is recommended for women who have family members with BRCA-related cancers. BRCA-related cancers include:  Breast.  Ovarian.  Tubal.  Peritoneal cancers.  Results of the assessment will determine the need for genetic counseling and BRCA1 and BRCA2 testing. Colorectal Cancer  This type of cancer can be detected and often prevented.  Routine colorectal cancer screening usually begins  at 81 years of age and continues through 81 years of age.  Your health care provider Bogen recommend screening at an earlier age if you have risk factors for colon cancer.  Your health care provider Mizell also recommend using home test kits to check for hidden blood in the stool.  A small camera at the end of a tube can be used to examine your colon directly (sigmoidoscopy or colonoscopy). This is done to check for the earliest forms of colorectal cancer.  Routine screening usually begins at age 50.  Direct examination of the colon should be repeated every 5-10 years through 81 years of age. However, you Raboin need to be screened more often if early forms of precancerous polyps or small growths are found. Skin Cancer  Check your skin from head to toe regularly.  Tell your health care provider about any new moles or changes in moles, especially if there is a change in a mole's shape or color.  Also tell your health care provider if you have a mole that is larger than the size of a pencil eraser.  Always use sunscreen. Apply sunscreen liberally and repeatedly throughout the day.  Protect yourself by wearing long sleeves, pants, a wide-brimmed hat, and sunglasses whenever you are outside. Heart disease, diabetes, and high blood pressure  High blood pressure causes heart disease and increases the risk of stroke. High blood pressure is more likely to develop in:  People who have blood pressure in the high end of the normal range (130-139/85-89 mm Hg).  People who are overweight or obese.  People who are African American.  If you are 18-39 years of age, have your blood pressure checked every 3-5 years. If you are 40 years of age or older, have your blood pressure checked every year. You should have your blood pressure measured twice-once when you are at a hospital or clinic, and once when you are not at a hospital or clinic. Record the average of the two measurements. To check your blood pressure  when you are not at a hospital or clinic, you can use:  An automated blood pressure machine at a pharmacy.  A home blood pressure monitor.  If you are between 55 years and 79 years old, ask your health care provider if you should take aspirin to prevent strokes.  Have regular diabetes screenings. This involves taking a blood sample to check your fasting blood sugar level.  If you are at a normal weight and have a low risk for diabetes, have this test once every three years after 81 years of age.  If you are overweight and have a high risk for diabetes, consider being tested at a younger age or more often. Preventing infection Hepatitis B  If you have a higher risk for hepatitis B, you should be screened for this virus. You are considered at high risk for hepatitis B if:  You were born in a country where hepatitis B is common. Ask your health care provider which countries are   considered high risk.  Your parents were born in a high-risk country, and you have not been immunized against hepatitis B (hepatitis B vaccine).  You have HIV or AIDS.  You use needles to inject street drugs.  You live with someone who has hepatitis B.  You have had sex with someone who has hepatitis B.  You get hemodialysis treatment.  You take certain medicines for conditions, including cancer, organ transplantation, and autoimmune conditions. Hepatitis C  Blood testing is recommended for:  Everyone born from 1945 through 1965.  Anyone with known risk factors for hepatitis C. Osteoporosis and menopause  Osteoporosis is a disease in which the bones lose minerals and strength with aging. This can result in serious bone fractures. Your risk for osteoporosis can be identified using a bone density scan.  If you are 65 years of age or older, or if you are at risk for osteoporosis and fractures, ask your health care provider if you should be screened.  Ask your health care provider whether you should take  a calcium or vitamin D supplement to lower your risk for osteoporosis.  Menopause Breighner have certain physical symptoms and risks.  Hormone replacement therapy Paulos reduce some of these symptoms and risks. Talk to your health care provider about whether hormone replacement therapy is right for you. Follow these instructions at home:  Schedule regular health, dental, and eye exams.  Stay current with your immunizations.  Do not use any tobacco products including cigarettes, chewing tobacco, or electronic cigarettes.  If you are pregnant, do not drink alcohol.  If you are breastfeeding, limit how much and how often you drink alcohol.  Limit alcohol intake to no more than 1 drink per day for nonpregnant women. One drink equals 12 ounces of beer, 5 ounces of wine, or 1 ounces of hard liquor.  Do not use street drugs.  Do not share needles.  Ask your health care provider for help if you need support or information about quitting drugs.  Tell your health care provider if you often feel depressed.  Tell your health care provider if you have ever been abused or do not feel safe at home. This information is not intended to replace advice given to you by your health care provider. Make sure you discuss any questions you have with your health care provider. Document Released: 12/08/2010 Document Revised: 10/31/2015 Document Reviewed: 02/26/2015  2017 Elsevier  

## 2016-07-09 NOTE — Patient Instructions (Signed)
Sciatica Introduction Sciatica is pain, numbness, weakness, or tingling along your sciatic nerve. The sciatic nerve starts in the lower back and goes down the back of each leg. Sciatica happens when this nerve is pinched or has pressure put on it. Sciatica usually goes away on its own or with treatment. Sometimes, sciatica Biel keep coming back (recur). Follow these instructions at home: Medicines  Take over-the-counter and prescription medicines only as told by your doctor.  Do not drive or use heavy machinery while taking prescription pain medicine. Managing pain  If directed, put ice on the affected area.  Put ice in a plastic bag.  Place a towel between your skin and the bag.  Leave the ice on for 20 minutes, 2-3 times a day.  After icing, apply heat to the affected area before you exercise or as often as told by your doctor. Use the heat source that your doctor tells you to use, such as a moist heat pack or a heating pad.  Place a towel between your skin and the heat source.  Leave the heat on for 20-30 minutes.  Remove the heat if your skin turns bright red. This is especially important if you are unable to feel pain, heat, or cold. You [place] have a greater risk of getting burned. Activity  Return to your normal activities as told by your doctor. Ask your doctor what activities are safe for you.  Avoid activities that make your sciatica worse.  Take short rests during the day. Rest in a lying or standing position. This is usually better than sitting to rest.  When you rest for a long time, do some physical activity or stretching between periods of rest.  Avoid sitting for a long time without moving. Get up and move around at least one time each hour.  Exercise and stretch regularly, as told by your doctor.  Do not lift anything that is heavier than 10 lb (4.5 kg) while you have symptoms of sciatica.  Avoid lifting heavy things even when you do not have symptoms.  Avoid  lifting heavy things over and over.  When you lift objects, always lift in a way that is safe for your body. To do this, you should:  Bend your knees.  Keep the object close to your body.  Avoid twisting. General instructions  Use good posture.  Avoid leaning forward when you are sitting.  Avoid hunching over when you are standing.  Stay at a healthy weight.  Wear comfortable shoes that support your feet. Avoid wearing high heels.  Avoid sleeping on a mattress that is too soft or too hard. You might have less pain if you sleep on a mattress that is firm enough to support your back.  Keep all follow-up visits as told by your doctor. This is important. Contact a doctor if:  You have pain that:  Wakes you up when you are sleeping.  Gets worse when you lie down.  Is worse than the pain you have had in the past.  Lasts longer than 4 weeks.  You lose weight for without trying. Get help right away if:  You cannot control when you pee (urinate) or poop (have a bowel movement).  You have weakness in any of these areas and it gets worse.  Lower back.  Lower belly (pelvis).  Butt (buttocks).  Legs.  You have redness or swelling of your back.  You have a burning feeling when you pee. This information is not intended to  replace advice given to you by your health care provider. Make sure you discuss any questions you have with your health care provider. Document Released: 03/03/2008 Document Revised: 10/31/2015 Document Reviewed: 02/01/2015  2017 Elsevier

## 2016-07-29 DIAGNOSIS — R195 Other fecal abnormalities: Secondary | ICD-10-CM | POA: Diagnosis not present

## 2016-07-29 DIAGNOSIS — Z800 Family history of malignant neoplasm of digestive organs: Secondary | ICD-10-CM | POA: Diagnosis not present

## 2016-07-29 DIAGNOSIS — Z86010 Personal history of colonic polyps: Secondary | ICD-10-CM | POA: Diagnosis not present

## 2016-08-04 DIAGNOSIS — H353212 Exudative age-related macular degeneration, right eye, with inactive choroidal neovascularization: Secondary | ICD-10-CM | POA: Diagnosis not present

## 2016-08-04 DIAGNOSIS — R195 Other fecal abnormalities: Secondary | ICD-10-CM | POA: Diagnosis not present

## 2016-09-10 DIAGNOSIS — H401131 Primary open-angle glaucoma, bilateral, mild stage: Secondary | ICD-10-CM | POA: Diagnosis not present

## 2016-09-15 DIAGNOSIS — H353212 Exudative age-related macular degeneration, right eye, with inactive choroidal neovascularization: Secondary | ICD-10-CM | POA: Diagnosis not present

## 2016-09-30 ENCOUNTER — Other Ambulatory Visit: Payer: Self-pay | Admitting: Physician Assistant

## 2016-09-30 DIAGNOSIS — E876 Hypokalemia: Secondary | ICD-10-CM

## 2016-10-12 ENCOUNTER — Other Ambulatory Visit: Payer: Self-pay | Admitting: Physician Assistant

## 2016-10-12 DIAGNOSIS — I10 Essential (primary) hypertension: Secondary | ICD-10-CM

## 2016-10-12 NOTE — Telephone Encounter (Signed)
[facility] faxed a request on the following medication. Thanks CC  triamterene-hydrochlorothiazide (MAXZIDE-25) 37.5-25 MG tablet  Take one tablet every day.

## 2016-10-13 MED ORDER — TRIAMTERENE-HCTZ 37.5-25 MG PO TABS: 1.0000 | ORAL_TABLET | Freq: Every day | ORAL | 3 refills | Status: DC

## 2016-10-15 DIAGNOSIS — H401131 Primary open-angle glaucoma, bilateral, mild stage: Secondary | ICD-10-CM | POA: Diagnosis not present

## 2016-10-16 ENCOUNTER — Encounter: Payer: Self-pay | Admitting: *Deleted

## 2016-10-19 ENCOUNTER — Ambulatory Visit
Admission: RE | Admit: 2016-10-19 | Discharge: 2016-10-19 | Disposition: A | Discharge status: Home / Self Care | Payer: Medicare Other | Source: Ambulatory Visit | Attending: Unknown Physician Specialty | Admitting: Unknown Physician Specialty

## 2016-10-19 ENCOUNTER — Ambulatory Visit: Payer: Medicare Other | Admitting: Anesthesiology

## 2016-10-19 ENCOUNTER — Encounter: Payer: Self-pay | Admitting: *Deleted

## 2016-10-19 ENCOUNTER — Encounter
Admission: RE | Disposition: A | Discharge status: Home / Self Care | Payer: Self-pay | Source: Ambulatory Visit | Attending: Unknown Physician Specialty

## 2016-10-19 DIAGNOSIS — I10 Essential (primary) hypertension: Secondary | ICD-10-CM | POA: Insufficient documentation

## 2016-10-19 DIAGNOSIS — D123 Benign neoplasm of transverse colon: Secondary | ICD-10-CM | POA: Diagnosis not present

## 2016-10-19 DIAGNOSIS — F329 Major depressive disorder, single episode, unspecified: Secondary | ICD-10-CM | POA: Diagnosis not present

## 2016-10-19 DIAGNOSIS — F419 Anxiety disorder, unspecified: Secondary | ICD-10-CM | POA: Insufficient documentation

## 2016-10-19 DIAGNOSIS — D120 Benign neoplasm of cecum: Secondary | ICD-10-CM | POA: Insufficient documentation

## 2016-10-19 DIAGNOSIS — D122 Benign neoplasm of ascending colon: Secondary | ICD-10-CM | POA: Diagnosis not present

## 2016-10-19 DIAGNOSIS — E785 Hyperlipidemia, unspecified: Secondary | ICD-10-CM | POA: Diagnosis not present

## 2016-10-19 DIAGNOSIS — Z86010 Personal history of colonic polyps: Secondary | ICD-10-CM | POA: Insufficient documentation

## 2016-10-19 DIAGNOSIS — Z79899 Other long term (current) drug therapy: Secondary | ICD-10-CM | POA: Insufficient documentation

## 2016-10-19 DIAGNOSIS — K635 Polyp of colon: Secondary | ICD-10-CM | POA: Diagnosis not present

## 2016-10-19 DIAGNOSIS — Z1211 Encounter for screening for malignant neoplasm of colon: Secondary | ICD-10-CM | POA: Diagnosis not present

## 2016-10-19 DIAGNOSIS — K649 Unspecified hemorrhoids: Secondary | ICD-10-CM | POA: Diagnosis not present

## 2016-10-19 DIAGNOSIS — D175 Benign lipomatous neoplasm of intra-abdominal organs: Secondary | ICD-10-CM | POA: Diagnosis not present

## 2016-10-19 HISTORY — PX: COLONOSCOPY WITH PROPOFOL: SHX5780

## 2016-10-19 LAB — SURGICAL PATHOLOGY

## 2016-10-19 SURGERY — COLONOSCOPY WITH PROPOFOL
Anesthesia: General

## 2016-10-19 MED ORDER — PROPOFOL 500 MG/50ML IV EMUL
INTRAVENOUS | Status: AC
  Filled 2016-10-19: qty 50

## 2016-10-19 MED ORDER — SODIUM CHLORIDE 0.9 % IV SOLN: INTRAVENOUS | Status: DC

## 2016-10-19 MED ORDER — SODIUM CHLORIDE 0.9 % IV SOLN
INTRAVENOUS | Status: DC
  Administered 2016-10-19 (×2): via INTRAVENOUS

## 2016-10-19 MED ORDER — ONDANSETRON HCL 4 MG/2ML IJ SOLN
INTRAMUSCULAR | Status: AC
  Filled 2016-10-19: qty 2

## 2016-10-19 MED ORDER — PROPOFOL 10 MG/ML IV BOLUS
INTRAVENOUS | Status: DC | PRN
  Administered 2016-10-19: 50 mg via INTRAVENOUS

## 2016-10-19 MED ORDER — PROPOFOL 500 MG/50ML IV EMUL
INTRAVENOUS | Status: DC | PRN
  Administered 2016-10-19: 100 ug/kg/min via INTRAVENOUS

## 2016-10-19 MED ORDER — PIPERACILLIN-TAZOBACTAM 3.375 G IVPB 30 MIN
3.3750 g | Freq: Once | INTRAVENOUS | Status: AC
  Administered 2016-10-19: 3.375 g via INTRAVENOUS
  Filled 2016-10-19: qty 50

## 2016-10-19 MED ORDER — ONDANSETRON HCL 4 MG/2ML IJ SOLN
INTRAMUSCULAR | Status: DC | PRN
  Administered 2016-10-19: 4 mg via INTRAVENOUS

## 2016-10-19 NOTE — Anesthesia Post-op Follow-up Note (Cosign Needed)
Anesthesia QCDR form completed.        

## 2016-10-19 NOTE — Transfer of Care (Signed)
Immediate Anesthesia Transfer of Care Note  Patient: Jordan Gordon  Procedure(s) Performed: Procedure(s): COLONOSCOPY WITH PROPOFOL (N/A)  Patient Location: PACU  Anesthesia Type:General  Level of Consciousness: awake, alert  and oriented  Airway & Oxygen Therapy: Patient Spontanous Breathing and Patient connected to nasal cannula oxygen  Post-op Assessment: Report given to RN and Post -op Vital signs reviewed and stable  Post vital signs: Reviewed and stable  Last Vitals:  Vitals:   10/19/16 1330 10/19/16 1459  BP: (!) 142/80 (!) 149/91  Pulse: 88 70  Resp: 18 17  Temp: 36.4 C     Last Pain:  Vitals:   10/19/16 1330  TempSrc: Tympanic         Complications: No apparent anesthesia complications

## 2016-10-19 NOTE — Anesthesia Preprocedure Evaluation (Signed)
Anesthesia Evaluation  Patient identified by MRN, date of birth, ID band Patient awake    Reviewed: Allergy & Precautions, NPO status , Patient's Chart, lab work & pertinent test results  History of Anesthesia Complications (+) history of anesthetic complications  Airway Mallampati: III       Dental   Pulmonary neg pulmonary ROS,           Cardiovascular hypertension, Pt. on medications      Neuro/Psych Anxiety Depression negative neurological ROS     GI/Hepatic negative GI ROS, Neg liver ROS,   Endo/Other  negative endocrine ROS  Renal/GU Renal InsufficiencyRenal disease     Musculoskeletal   Abdominal   Peds  Hematology negative hematology ROS (+)   Anesthesia Other Findings   Reproductive/Obstetrics                             Anesthesia Physical Anesthesia Plan  ASA: III  Anesthesia Plan: General   Post-op Pain Management:    Induction: Intravenous  Airway Management Planned: Nasal Cannula  Additional Equipment:   Intra-op Plan:   Post-operative Plan:   Informed Consent: I have reviewed the patients History and Physical, chart, labs and discussed the procedure including the risks, benefits and alternatives for the proposed anesthesia with the patient or authorized representative who has indicated his/her understanding and acceptance.     Plan Discussed with:   Anesthesia Plan Comments:         Anesthesia Quick Evaluation

## 2016-10-19 NOTE — Op Note (Signed)
[facility] Patient Name: Jordan Gordon Procedure Date: 10/19/2016 2:04 PM MRN: [id number] Account #: [id number] Date of Birth: 01-10-1934 Admit Type: Outpatient Age: 83 Room: [facility] ENDO ROOM 3 Gender: Female Note Status: Finalized Procedure:            Colonoscopy Indications:          High risk colon cancer surveillance: Personal history                        of colonic polyps Providers:            Manya Silvas, MD Referring MD:         Mar Daring (Referring MD) Medicines:            Propofol per Anesthesia Complications:        No immediate complications. Procedure:            Pre-Anesthesia Assessment:                       - After reviewing the risks and benefits, the patient                        was deemed in satisfactory condition to undergo the                        procedure.                       After obtaining informed consent, the colonoscope was                        passed under direct vision. Throughout the procedure,                        the patient's blood pressure, pulse, and oxygen                        saturations were monitored continuously. The                        Colonoscope was introduced through the anus and                        advanced to the the cecum, identified by appendiceal                        orifice and ileocecal valve. The colonoscopy was                        performed without difficulty. The patient tolerated the                        procedure well. The quality of the bowel preparation                        was adequate to identify polyps. Findings:      A 25 mm polyp was found in the hepatic flexure. The polyp was sessile.       The polyp was removed with a hot snare. Resection and retrieval were       complete. To prevent bleeding after the polypectomy, three  hemostatic       clips were successfully placed. There was no bleeding during, or at the       end, of the procedure.      A small polyp was found in the ascending colon. The polyp was sessile.       The polyp was removed with a hot snare. Resection and retrieval were       complete.      A small polyp was found in the cecum. The polyp was sessile. The polyp       was removed with a hot snare. Resection and retrieval were complete. To       prevent bleeding after the polypectomy, one hemostatic clip was       successfully placed. There was no bleeding during, or at the end, of the       procedure.      A small polyp was found in the proximal ascending colon. The polyp was       sessile. The polyp was removed with a hot snare. Resection and retrieval       were complete.      A small polyp was found in the transverse colon. The polyp was sessile.       The polyp was removed with a hot snare. Resection and retrieval were       complete.      A diminutive polyp was found in the ascending colon. The polyp was       sessile. The polyp was removed with a jumbo cold forceps. Resection and       retrieval were complete.      There was a large lipoma, in the transverse colon. Impression:           - One 25 mm polyp at the hepatic flexure, removed with                        a hot snare. Resected and retrieved. Clips were placed.                       - One small polyp in the ascending colon, removed with                        a hot snare. Resected and retrieved.                       - One small polyp in the cecum, removed with a hot                        snare. Resected and retrieved. Clip was placed.                       - One small polyp in the proximal ascending colon,                        removed with a hot snare. Resected and retrieved.                       - One small polyp in the transverse colon, removed with                        a hot snare. Resected and retrieved.                       -  One diminutive polyp in the ascending colon, removed                        with a jumbo cold forceps.  Resected and retrieved.                       - Large lipoma in the transverse colon. Recommendation:       - Await pathology results. Manya Silvas, MD 10/19/2016 2:58:32 PM This report has been signed electronically. Number of Addenda: 0 Note Initiated On: 10/19/2016 2:04 PM Scope Withdrawal Time: 0 hours 23 minutes 56 seconds  Total Procedure Duration: 0 hours 43 minutes 58 seconds       [facility]

## 2016-10-19 NOTE — H&P (Signed)
Primary Care Physician:  Mar Daring, PA-C Primary Gastroenterologist:  Dr. Vira Agar  Pre-Procedure History & Physical: HPI:  Jordan Gordon is a 83 y.o. female is here for an colonoscopy.   Past Medical History:  Diagnosis Date  . Anxiety   . Hyperlipidemia   . Hypertension   . Major depressive disorder     Past Surgical History:  Procedure Laterality Date  . ABDOMINAL HYSTERECTOMY  1979  . BREAST EXCISIONAL BIOPSY Left 2005   benign  . COLONOSCOPY WITH PROPOFOL N/A 07/22/2015   Procedure: COLONOSCOPY WITH PROPOFOL;  Surgeon: Manya Silvas, MD;  Location: [facility] ENDOSCOPY;  Service: Endoscopy;  Laterality: N/A;  . KNEE SURGERY Left 2011   2007  . TONSILLECTOMY  1940   ADENOIDS    Prior to Admission medications   Medication Sig Start Date End Date Taking? Authorizing Provider  ALPRAZolam Duanne Moron) 0.5 MG tablet Take 1 tablet (0.5 mg total) by mouth 2 (two) times daily. 05/12/16  Yes Burnette, Jennifer M, PA-C  LUMIGAN 0.01 % SOLN Place 1 drop into both eyes at bedtime.  03/20/15  Yes [provider]  potassium chloride SA (K-DUR,KLOR-CON) 20 MEQ tablet TAKE ONE TABLET EVERY DAY 09/30/16  Yes Mar Daring, PA-C  Pseudoephedrine HCl (SINUS & ALLERGY 12 HOUR PO) Take by mouth.   Yes [provider]  sertraline (ZOLOFT) 100 MG tablet Take 2 tablets (200 mg total) by mouth daily. 09/12/15  Yes Margarita Rana, MD  timolol (TIMOPTIC) 0.5 % ophthalmic solution Place 1 drop into both eyes.  03/20/15  Yes [provider]  tobramycin-dexamethasone Baird Cancer) ophthalmic solution  03/27/15  Yes [provider]  traZODone (DESYREL) 100 MG tablet TAKE ONE TABLET AT BEDTIME 05/29/16  Yes Mar Daring, PA-C  triamterene-hydrochlorothiazide (MAXZIDE-25) 37.5-25 MG tablet Take 1 tablet by mouth daily. 10/13/16  Yes Mar Daring, PA-C  naproxen sodium (ANAPROX) 220 MG tablet Take 220 mg by mouth 2 (two) times daily with a meal.     [provider]    Allergies as of 08/18/2016 - Review Complete 07/09/2016  Allergen Reaction Noted  . Codeine  04/18/2015    Family History  Problem Relation Age of Onset  . Cancer Mother   . Heart disease Father   . Cancer Brother     Social History   Social History  . Marital status: Widowed    Spouse name: N/A  . Number of children: N/A  . Years of education: N/A   Occupational History  . Not on file.   Social History Main Topics  . Smoking status: Never Smoker  . Smokeless tobacco: Never Used  . Alcohol use 1.8 oz/week    3 Glasses of wine per week     Comment: OCCASIONAL  . Drug use: No  . Sexual activity: Not on file   Other Topics Concern  . Not on file   Social History Narrative  . No narrative on file    Review of Systems: See HPI, otherwise negative ROS  Physical Exam: BP (!) 149/91   Pulse 62   Temp 97 F (36.1 C) (Tympanic)   Resp 16   Ht 5\' 5"  (1.651 m)   Wt 70.3 kg (155 lb)   SpO2 100%   BMI 25.79 kg/m  General:   Alert,  pleasant and cooperative in NAD Head:  Normocephalic and atraumatic. Neck:  Supple; no masses or thyromegaly. Lungs:  Clear throughout to auscultation.    Heart:  Regular  rate and rhythm. Abdomen:  Soft, nontender and nondistended. Normal bowel sounds, without guarding, and without rebound.   Neurologic:  Alert and  oriented x4;  grossly normal neurologically.  Impression/Plan: Jordan Gordon is here for an colonoscopy to be performed for personal history of multiple polyps.  Risks, benefits, limitations, and alternatives regarding  colonoscopy have been reviewed with the patient.  Questions have been answered.  All parties agreeable.   Gaylyn Cheers, MD  10/19/2016, 3:11 PM

## 2016-10-20 ENCOUNTER — Encounter: Payer: Self-pay | Admitting: Unknown Physician Specialty

## 2016-10-20 NOTE — Anesthesia Postprocedure Evaluation (Signed)
Anesthesia Post Note  Patient: Jordan Gordon  Procedure(s) Performed: Procedure(s) (LRB): COLONOSCOPY WITH PROPOFOL (N/A)  Patient location during evaluation: Endoscopy Anesthesia Type: General Level of consciousness: awake and alert Pain management: pain level controlled Vital Signs Assessment: post-procedure vital signs reviewed and stable Respiratory status: spontaneous breathing and respiratory function stable Cardiovascular status: stable Anesthetic complications: no     Last Vitals:  Vitals:   10/19/16 1520 10/19/16 1530  BP: 137/75 (!) 154/70  Pulse: 77 75  Resp: 16 16  Temp:      Last Pain:  Vitals:   10/19/16 1500  TempSrc: Tympanic                 KEPHART,WILLIAM K

## 2016-10-27 DIAGNOSIS — H353212 Exudative age-related macular degeneration, right eye, with inactive choroidal neovascularization: Secondary | ICD-10-CM | POA: Diagnosis not present

## 2016-11-02 ENCOUNTER — Other Ambulatory Visit: Payer: Self-pay | Admitting: Physician Assistant

## 2016-11-02 DIAGNOSIS — F32A Depression, unspecified: Secondary | ICD-10-CM

## 2016-11-02 DIAGNOSIS — G4700 Insomnia, unspecified: Secondary | ICD-10-CM

## 2016-11-02 DIAGNOSIS — F329 Major depressive disorder, single episode, unspecified: Secondary | ICD-10-CM

## 2016-11-02 DIAGNOSIS — F419 Anxiety disorder, unspecified: Secondary | ICD-10-CM

## 2016-11-03 MED ORDER — SERTRALINE HCL 100 MG PO TABS: 200.0000 mg | ORAL_TABLET | Freq: Every day | ORAL | 3 refills | Status: DC

## 2016-11-03 NOTE — Telephone Encounter (Signed)
LOV 07/09/2016. Renaldo Fiddler, CMA

## 2016-11-03 NOTE — Telephone Encounter (Signed)
[facility] faxed office for refill request on the following medications: sertraline (ZOLOFT) 100 MG tablet  90 day supply  Last Rx: 09/12/15 with 3 refills for 90 day supply written by Dr. Venia Minks Last OV: 07/09/16 Next OV: 01/07/17 Please advise. Thanks TNP

## 2016-11-10 ENCOUNTER — Other Ambulatory Visit: Payer: Self-pay | Admitting: Physician Assistant

## 2016-11-10 DIAGNOSIS — F419 Anxiety disorder, unspecified: Secondary | ICD-10-CM

## 2016-11-10 NOTE — Telephone Encounter (Signed)
rx called in-aa 

## 2016-12-21 IMAGING — US US BREAST*L* LIMITED INC AXILLA
1 series · 2 of 2 positions shown · non-contrast
Comparison: Previous exam(s).

CLINICAL DATA: 81-year-old female with spontaneous left nipple
discharge on 2 consecutive days 3 weeks ago. The patient states that
prior to the initial episode of discharge she felt pain in her left
breast. The patient describes the discharge as white. She denies any
bloody discharge.

EXAM:
2D DIGITAL DIAGNOSTIC BILATERAL MAMMOGRAM WITH CAD AND ADJUNCT TOMO
ULTRASOUND LEFT BREAST

[Series 1: us breast*left* limited inc axilla · 0.07mm/px · 2 of 2 slices shown]
[im 1/2]
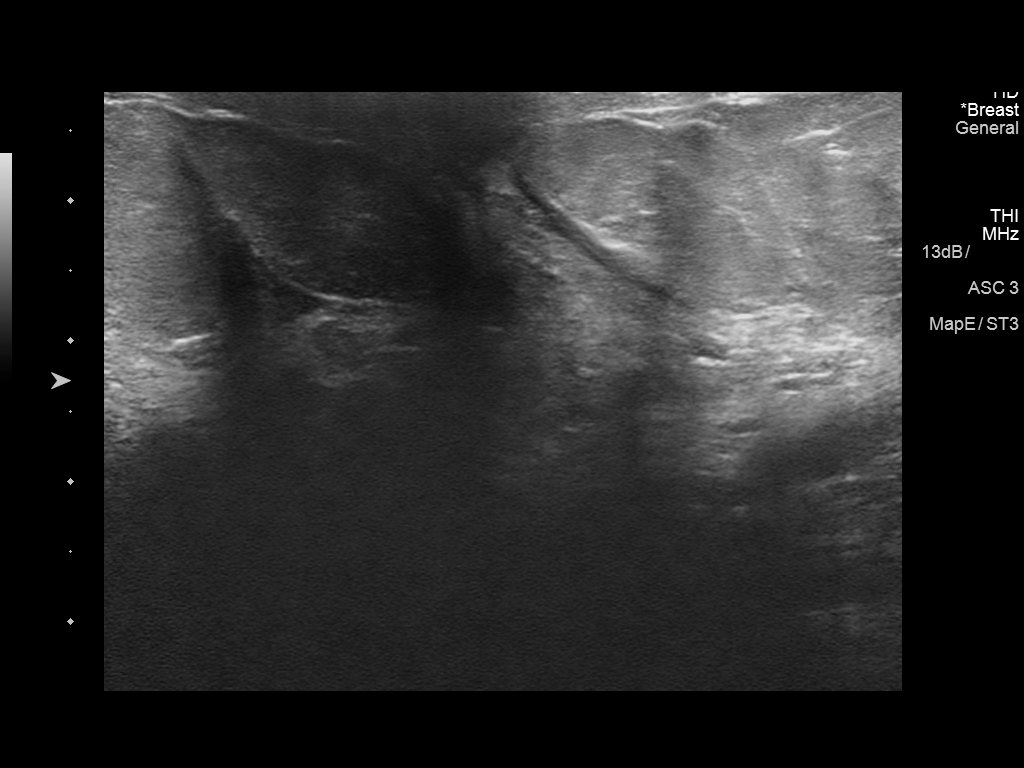
[im 2/2]
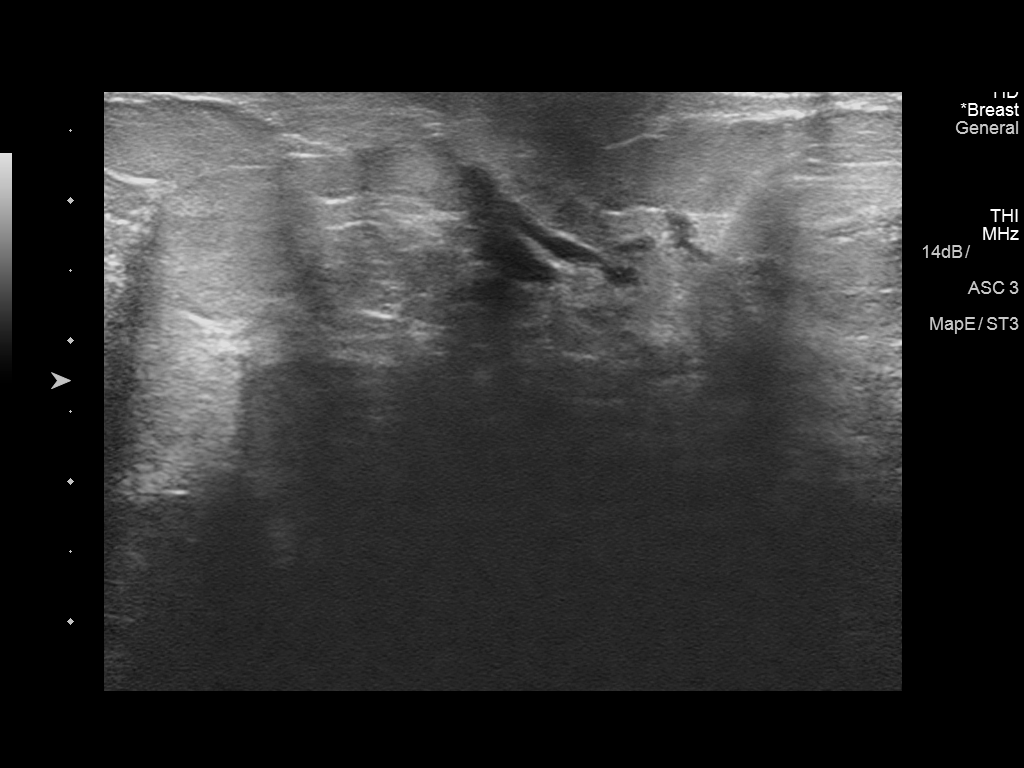

[2 of 2 positions shown; findings below may reference images not displayed]

ACR Breast Density Category c: The breast tissue is heterogeneously
dense, which may obscure small masses.
FINDINGS: No suspicious mass, calcifications, or other abnormality is
identified within either breast.

Mammographic images were processed with CAD.

On physical exam, I am unable to express any discharge from the left
nipple. The patient's left nipple is inverted which she states has
been the case for many years.

Targeted ultrasound of the left retroareolar will region was
performed demonstrating mild duct ectasia without intraductal mass
or suspicious sonographic finding.
IMPRESSION: No mammographic or sonographic evidence of malignancy.

RECOMMENDATION:
1. If the patient experiences persistent spontaneous left nipple
discharge, surgical consultation is recommended.
2.  Screening mammogram in one year.(Code:W4-[id number])

I have discussed the findings and recommendations with the patient.
Results were also provided in writing at the conclusion of the
visit. If applicable, a reminder letter will be sent to the patient
regarding the next appointment.

BI-RADS CATEGORY  1: Negative.

## 2016-12-22 DIAGNOSIS — H353212 Exudative age-related macular degeneration, right eye, with inactive choroidal neovascularization: Secondary | ICD-10-CM | POA: Diagnosis not present

## 2016-12-24 DIAGNOSIS — R0602 Shortness of breath: Secondary | ICD-10-CM | POA: Diagnosis not present

## 2016-12-24 DIAGNOSIS — Z9889 Other specified postprocedural states: Secondary | ICD-10-CM | POA: Diagnosis not present

## 2016-12-24 DIAGNOSIS — I10 Essential (primary) hypertension: Secondary | ICD-10-CM | POA: Diagnosis not present

## 2016-12-24 DIAGNOSIS — R079 Chest pain, unspecified: Secondary | ICD-10-CM | POA: Diagnosis not present

## 2016-12-24 DIAGNOSIS — E7800 Pure hypercholesterolemia, unspecified: Secondary | ICD-10-CM | POA: Diagnosis not present

## 2016-12-30 ENCOUNTER — Ambulatory Visit (INDEPENDENT_AMBULATORY_CARE_PROVIDER_SITE_OTHER): Payer: Medicare Other | Admitting: Physician Assistant

## 2016-12-30 ENCOUNTER — Encounter: Payer: Self-pay | Admitting: Physician Assistant

## 2016-12-30 VITALS — BP 144/72 | HR 100 | Temp 98.2°F | Resp 16 | Wt 161.0 lb

## 2016-12-30 DIAGNOSIS — I10 Essential (primary) hypertension: Secondary | ICD-10-CM | POA: Diagnosis not present

## 2016-12-30 DIAGNOSIS — E7800 Pure hypercholesterolemia, unspecified: Secondary | ICD-10-CM | POA: Diagnosis not present

## 2016-12-30 DIAGNOSIS — R739 Hyperglycemia, unspecified: Secondary | ICD-10-CM | POA: Diagnosis not present

## 2016-12-30 DIAGNOSIS — F5101 Primary insomnia: Secondary | ICD-10-CM | POA: Diagnosis not present

## 2016-12-30 DIAGNOSIS — N3001 Acute cystitis with hematuria: Secondary | ICD-10-CM | POA: Diagnosis not present

## 2016-12-30 DIAGNOSIS — F419 Anxiety disorder, unspecified: Secondary | ICD-10-CM

## 2016-12-30 DIAGNOSIS — Z6826 Body mass index (BMI) 26.0-26.9, adult: Secondary | ICD-10-CM | POA: Diagnosis not present

## 2016-12-30 DIAGNOSIS — F331 Major depressive disorder, recurrent, moderate: Secondary | ICD-10-CM | POA: Diagnosis not present

## 2016-12-30 LAB — POCT URINALYSIS DIPSTICK
BILIRUBIN UA: NEGATIVE
Glucose, UA: NEGATIVE
Ketones, UA: NEGATIVE
NITRITE UA: NEGATIVE
PH UA: 6.5 (ref 5.0–8.0)
PROTEIN UA: NEGATIVE
Spec Grav, UA: 1.025 (ref 1.010–1.025)
Urobilinogen, UA: 0.2 E.U./dL

## 2016-12-30 MED ORDER — SULFAMETHOXAZOLE-TRIMETHOPRIM 800-160 MG PO TABS: 1.0000 | ORAL_TABLET | Freq: Two times a day (BID) | ORAL | 0 refills | Status: DC

## 2016-12-30 MED ORDER — ALPRAZOLAM 0.5 MG PO TABS: 0.5000 mg | ORAL_TABLET | Freq: Three times a day (TID) | ORAL | 1 refills | Status: DC | PRN

## 2016-12-30 MED ORDER — CITALOPRAM HYDROBROMIDE 40 MG PO TABS: 40.0000 mg | ORAL_TABLET | Freq: Every day | ORAL | 1 refills | Status: DC

## 2016-12-30 NOTE — Patient Instructions (Addendum)
Tapering off Sertraline: Take 1.5 tablets PO in the morning x 1 week then decrease to Take 1 tablet PO in the morning x 1 week then decrease to  Take 0.5 tablet PO in the morning x 1 week then discontinue and then start Citalopram  Citalopram tablets What is this medicine? CITALOPRAM (sye TAL oh pram) is a medicine for depression. This medicine Bradsher be used for other purposes; ask your health care provider or pharmacist if you have questions. COMMON BRAND NAME(S): Celexa What should I tell my health care provider before I take this medicine? They need to know if you have any of these conditions: -bleeding disorders -bipolar disorder or a family history of bipolar disorder -glaucoma -heart disease -history of irregular heartbeat -kidney disease -liver disease -low levels of magnesium or potassium in the blood -receiving electroconvulsive therapy -seizures -suicidal thoughts, plans, or attempt; a previous suicide attempt by you or a family member -take medicines that treat or prevent blood clots -thyroid disease -an unusual or allergic reaction to citalopram, escitalopram, other medicines, foods, dyes, or preservatives -pregnant or trying to become pregnant -breast-feeding How should I use this medicine? Take this medicine by mouth with a glass of water. Follow the directions on the prescription label. You can take it with or without food. Take your medicine at regular intervals. Do not take your medicine more often than directed. Do not stop taking this medicine suddenly except upon the advice of your doctor. Stopping this medicine too quickly Varble cause serious side effects or your condition Scheunemann worsen. A special MedGuide will be given to you by the pharmacist with each prescription and refill. Be sure to read this information carefully each time. Talk to your pediatrician regarding the use of this medicine in children. Special care Conry be needed. Patients over 73 years old Maxfield have a  stronger reaction and need a smaller dose. Overdosage: If you think you have taken too much of this medicine contact a poison control center or emergency room at once. NOTE: This medicine is only for you. Do not share this medicine with others. What if I miss a dose? If you miss a dose, take it as soon as you can. If it is almost time for your next dose, take only that dose. Do not take double or extra doses. What Zazueta interact with this medicine? Do not take this medicine with any of the following medications: -certain medicines for fungal infections like fluconazole, itraconazole, ketoconazole, posaconazole, voriconazole -cisapride -dofetilide -dronedarone -escitalopram -linezolid -MAOIs like Carbex, Eldepryl, Marplan, Nardil, and Parnate -methylene blue (injected into a vein) -pimozide -thioridazine -ziprasidone This medicine Brothers also interact with the following medications: -alcohol -amphetamines -aspirin and aspirin-like medicines -carbamazepine -certain medicines for depression, anxiety, or psychotic disturbances -certain medicines for infections like chloroquine, clarithromycin, erythromycin, furazolidone, isoniazid, pentamidine -certain medicines for migraine headaches like almotriptan, eletriptan, frovatriptan, naratriptan, rizatriptan, sumatriptan, zolmitriptan -certain medicines for sleep -certain medicines that treat or prevent blood clots like dalteparin, enoxaparin, warfarin -cimetidine -diuretics -fentanyl -lithium -methadone -metoprolol -NSAIDs, medicines for pain and inflammation, like ibuprofen or naproxen -omeprazole -other medicines that prolong the QT interval (cause an abnormal heart rhythm) -procarbazine -rasagiline -supplements like St. [name]'s wort, kava kava, valerian -tramadol -tryptophan This list Aslin not describe all possible interactions. Give your health care provider a list of all the medicines, herbs, non-prescription drugs, or dietary  supplements you use. Also tell them if you smoke, drink alcohol, or use illegal drugs. Some items B[place]e interact with your medicine.  What should I watch for while using this medicine? Tell your doctor if your symptoms do not get better or if they get worse. Visit your doctor or health care professional for regular checks on your progress. Because it Ku take several weeks to see the full effects of this medicine, it is important to continue your treatment as prescribed by your doctor. Patients and their families should watch out for new or worsening thoughts of suicide or depression. Also watch out for sudden changes in feelings such as feeling anxious, agitated, panicky, irritable, hostile, aggressive, impulsive, severely restless, overly excited and hyperactive, or not being able to sleep. If this happens, especially at the beginning of treatment or after a change in dose, call your health care professional. Dennis Bast Camp[place] get drowsy or dizzy. Do not drive, use machinery, or do anything that needs mental alertness until you know how this medicine affects you. Do not stand or sit up quickly, especially if you are an older patient. This reduces the risk of dizzy or fainting spells. Alcohol Orren interfere with the effect of this medicine. Avoid alcoholic drinks. Your mouth Strehle get dry. Chewing sugarless gum or sucking hard candy, and drinking plenty of water will help. Contact your doctor if the problem does not go away or is severe. What side effects Gerding I notice from receiving this medicine? Side effects that you should report to your doctor or health care professional as soon as possible: -allergic reactions like skin rash, itching or hives, swelling of the face, lips, or tongue -anxious -black, tarry stools -breathing problems -changes in vision -chest pain -confusion -elevated mood, decreased need for sleep, racing thoughts, impulsive behavior -eye pain -fast, irregular heartbeat -feeling faint or  lightheaded, falls -feeling agitated, angry, or irritable -hallucination, loss of contact with reality -loss of balance or coordination -loss of memory -painful or prolonged erections -restlessness, pacing, inability to keep still -seizures -stiff muscles -suicidal thoughts or other mood changes -trouble sleeping -unusual bleeding or bruising -unusually weak or tired -vomiting Side effects that usually do not require medical attention (report to your doctor or health care professional if they continue or are bothersome): -change in appetite or weight -change in sex drive or performance -dizziness -headache -increased sweating -indigestion, nausea -tremors This list Viscomi not describe all possible side effects. Call your doctor for medical advice about side effects. You [place] report side effects to FDA at 1-800-FDA-1088. Where should I keep my medicine? Keep out of reach of children. Store at room temperature between 15 and 30 degrees C (59 and 86 degrees F). Throw away any unused medicine after the expiration date. NOTE: This sheet is a summary. It Borre not cover all possible information. If you have questions about this medicine, talk to your doctor, pharmacist, or health care provider.  2018 Elsevier/Gold Standard (2015-10-28 13:18:52)

## 2016-12-30 NOTE — Progress Notes (Signed)
Patient: Jordan Gordon Female    DOB: 06-27-1933   83 y.o.   MRN: [id number] Visit Date: 12/30/2016  Today's Provider: Mar Daring, PA-C   Chief Complaint  Patient presents with  . Hypertension  . Hyperlipidemia   Subjective:    HPI  Hypertension, follow-up:  BP Readings from Last 3 Encounters:  12/30/16 (!) 144/72  10/19/16 (!) 154/70  07/09/16 131/76    She was last seen for hypertension 5 months ago.  BP at that visit was 131/76. Management since that visit includes no changes. She reports good compliance with treatment. She is not having side effects.  She is not exercising. She is adherent to low salt diet.   Outside blood pressures are not being checked. She is experiencing fatigue.  Patient denies exertional chest pressure/discomfort and palpitations.   Cardiovascular risk factors include dyslipidemia.  Of note BP was 132/64 at Dr. Saralyn Pilar office on 12/24/16.    Weight trend: stable Wt Readings from Last 3 Encounters:  12/30/16 161 lb (73 kg)  10/19/16 155 lb (70.3 kg)  07/09/16 163 lb (73.9 kg)    Current diet: well balanced    Lipid/Cholesterol, Follow-up:   Last seen for this 5 months ago.  Management changes since that visit include no changes. . Last Lipid Panel:    Component Value Date/Time   CHOL 255 (H) 03/13/2016 0804   TRIG 139 03/13/2016 0804   HDL 54 03/13/2016 0804   CHOLHDL 4.7 (H) 03/13/2016 0804   LDLCALC 173 (H) 03/13/2016 0804    Risk factors for vascular disease include hypertension  She reports good compliance with treatment. She is not having side effects.  Current symptoms include none and have been stable. Weight trend: stable Prior visit with dietician: no Current diet: well balanced Current exercise: none  Wt Readings from Last 3 Encounters:  12/30/16 161 lb (73 kg)  10/19/16 155 lb (70.3 kg)  07/09/16 163 lb (73.9 kg)     Anxiety, follow up: Patient was last seen 6 months ago. No changes  were made in her medications. Patient is currently taking alprazolam 0.5mg  twice daily and Zoloft 200mg  daily. Patient thinks that that her anxiety meds needs to be adjusted. She has recently had increased stress.   Patient also mentions that she has had a strong urine odor for the last month. She has not been taking anything OTC for her symptoms.      Allergies  Allergen Reactions  . Codeine     nausea     Current Outpatient Prescriptions:  .  ALPRAZolam (XANAX) 0.5 MG tablet, TAKE ONE TABLET BY MOUTH TWICE DAILY, Disp: 60 tablet, Rfl: 5 .  LUMIGAN 0.01 % SOLN, Place 1 drop into both eyes at bedtime. , Disp: , Rfl:  .  naproxen sodium (ANAPROX) 220 MG tablet, Take 220 mg by mouth 2 (two) times daily with a meal., Disp: , Rfl:  .  potassium chloride SA (K-DUR,KLOR-CON) 20 MEQ tablet, TAKE ONE TABLET EVERY DAY, Disp: 90 tablet, Rfl: 3 .  Pseudoephedrine HCl (SINUS & ALLERGY 12 HOUR PO), Take by mouth., Disp: , Rfl:  .  sertraline (ZOLOFT) 100 MG tablet, Take 2 tablets (200 mg total) by mouth daily., Disp: 180 tablet, Rfl: 3 .  timolol (TIMOPTIC) 0.5 % ophthalmic solution, Place 1 drop into both eyes. , Disp: , Rfl:  .  tobramycin-dexamethasone (TOBRADEX) ophthalmic solution, , Disp: , Rfl:  .  traZODone (DESYREL) 100 MG tablet, TAKE  ONE TABLET BY MOUTH EVERY EVENING AT BEDTIME, Disp: 90 tablet, Rfl: 3 .  triamterene-hydrochlorothiazide (MAXZIDE-25) 37.5-25 MG tablet, Take 1 tablet by mouth daily., Disp: 90 tablet, Rfl: 3  Review of Systems  Constitutional: Positive for fatigue. Negative for activity change, appetite change, chills, diaphoresis, fever and unexpected weight change.  Respiratory: Negative.   Cardiovascular: Negative.   Gastrointestinal: Negative.   Genitourinary: Positive for dysuria. Negative for difficulty urinating, flank pain, hematuria, urgency, vaginal bleeding, vaginal discharge and vaginal pain.  Neurological: Negative.   Psychiatric/Behavioral: Positive for  agitation and sleep disturbance. The patient is nervous/anxious.     Social History  Substance Use Topics  . Smoking status: Never Smoker  . Smokeless tobacco: Never Used  . Alcohol use 1.8 oz/week    3 Glasses of wine per week     Comment: OCCASIONAL   Objective:   BP (!) 144/72 (BP Location: Right Arm, Patient Position: Sitting, Cuff Size: Normal)   Pulse 100   Temp 98.2 F (36.8 C)   Resp 16   Wt 161 lb (73 kg)   BMI 26.79 kg/m  Vitals:   12/30/16 0954  BP: (!) 144/72  Pulse: 100  Resp: 16  Temp: 98.2 F (36.8 C)  Weight: 161 lb (73 kg)     Physical Exam  Constitutional: She appears well-developed and well-nourished. No distress.  Neck: Normal range of motion. Neck supple. No tracheal deviation present. No thyromegaly present.  Cardiovascular: Normal rate, regular rhythm and normal heart sounds.  Exam reveals no gallop and no friction rub.   No murmur heard. Pulmonary/Chest: Effort normal and breath sounds normal. No respiratory distress. She has no wheezes. She has no rales.  Abdominal: Soft. Bowel sounds are normal. She exhibits no distension and no mass. There is no tenderness. There is no rebound, no guarding and no CVA tenderness.  Lymphadenopathy:    She has no cervical adenopathy.  Skin: She is not diaphoretic.  Psychiatric: Her speech is normal and behavior is normal. Thought content normal. Her mood appears anxious. She exhibits a depressed mood.  Vitals reviewed.       Assessment & Plan:     1. Acute cystitis with hematuria Worsening symptoms. UA positive. Will treat empirically with Bactrim as below. Continue to push fluids. Urine sent for culture. Will follow up pending C&S results. She is to call if symptoms do not improve or if they worsen.  - Urine Culture - POCT urinalysis dipstick - sulfamethoxazole-trimethoprim (BACTRIM DS,SEPTRA DS) 800-160 MG tablet; Take 1 tablet by mouth 2 (two) times daily.  Dispense: 14 tablet; Refill: 0  2. Essential  (primary) hypertension Slightly up today, but had been normal at Dr. Saralyn Pilar office on 12/24/16. Will continue Maxzide 37.5-25mg  daily. Will monitor.   3. Primary insomnia Uses Trazodone 100mg  prn for sleep.  4. Pure hypercholesterolemia Stable.   5. Moderate episode of recurrent major depressive disorder (HCC) Uncontrolled. Will change therapy from sertraline to citalopram as below. Instructions for tapering sertraline and then starting citalopram were verbally explained and given to patient in written form on AVS. I will see her back in 6 weeks to see how she is doing.  - citalopram (CELEXA) 40 MG tablet; Take 1 tablet (40 mg total) by mouth daily.  Dispense: 90 tablet; Refill: 1  6. Blood glucose elevated Stable.   7. Anxiety See above medical treatment plan for # 5. - citalopram (CELEXA) 40 MG tablet; Take 1 tablet (40 mg total) by mouth daily.  Dispense:  90 tablet; Refill: 1 - ALPRAZolam (XANAX) 0.5 MG tablet; Take 1 tablet (0.5 mg total) by mouth 3 (three) times daily as needed for anxiety.  Dispense: 90 tablet; Refill: 1  8. BMI 26.0-26.9,adult Counseled patient on healthy lifestyle modifications including dieting and exercise.        Mar Daring, PA-C  [facility]

## 2017-01-01 LAB — URINE CULTURE

## 2017-01-07 ENCOUNTER — Ambulatory Visit: Payer: Medicare Other | Admitting: Physician Assistant

## 2017-02-04 ENCOUNTER — Ambulatory Visit (INDEPENDENT_AMBULATORY_CARE_PROVIDER_SITE_OTHER): Payer: Medicare Other | Admitting: Physician Assistant

## 2017-02-04 ENCOUNTER — Encounter: Payer: Self-pay | Admitting: Physician Assistant

## 2017-02-04 VITALS — BP 142/70 | HR 80 | Temp 98.2°F | Resp 16 | Wt 160.4 lb

## 2017-02-04 DIAGNOSIS — F321 Major depressive disorder, single episode, moderate: Secondary | ICD-10-CM | POA: Diagnosis not present

## 2017-02-04 DIAGNOSIS — S0120XA Unspecified open wound of nose, initial encounter: Secondary | ICD-10-CM | POA: Diagnosis not present

## 2017-02-04 MED ORDER — BACITRACIN ZINC 500 UNIT/GM EX OINT: 1.0000 "application " | TOPICAL_OINTMENT | Freq: Two times a day (BID) | CUTANEOUS | 0 refills | Status: DC

## 2017-02-04 MED ORDER — SERTRALINE HCL 100 MG PO TABS: 200.0000 mg | ORAL_TABLET | Freq: Every day | ORAL | 1 refills | Status: DC

## 2017-02-04 NOTE — Progress Notes (Signed)
Patient: Jordan Gordon Female    DOB: 18-Mar-1934   83 y.o.   MRN: [id number] Visit Date: 02/04/2017  Today's Provider: Mar Daring, PA-C   Chief Complaint  Patient presents with  . sore in her nose   Subjective:    HPI Patient is here today with c/o sore in her nose, right side. She noticed it 2 months ago. She reports it comes and goes but stays tender. She denies any injury. She has been putting nothing on the area. She denies any URI symptoms. No nasal bleeding.     Allergies  Allergen Reactions  . Codeine     nausea     Current Outpatient Prescriptions:  .  ALPRAZolam (XANAX) 0.5 MG tablet, Take 1 tablet (0.5 mg total) by mouth 3 (three) times daily as needed for anxiety., Disp: 90 tablet, Rfl: 1 .  citalopram (CELEXA) 40 MG tablet, Take 1 tablet (40 mg total) by mouth daily., Disp: 90 tablet, Rfl: 1 .  LUMIGAN 0.01 % SOLN, Place 1 drop into both eyes at bedtime. , Disp: , Rfl:  .  potassium chloride SA (K-DUR,KLOR-CON) 20 MEQ tablet, TAKE ONE TABLET EVERY DAY, Disp: 90 tablet, Rfl: 3 .  Pseudoephedrine HCl (SINUS & ALLERGY 12 HOUR PO), Take by mouth., Disp: , Rfl:  .  timolol (TIMOPTIC) 0.5 % ophthalmic solution, Place 1 drop into both eyes. , Disp: , Rfl:  .  tobramycin-dexamethasone (TOBRADEX) ophthalmic solution, , Disp: , Rfl:  .  traZODone (DESYREL) 100 MG tablet, TAKE ONE TABLET BY MOUTH EVERY EVENING AT BEDTIME, Disp: 90 tablet, Rfl: 3 .  triamterene-hydrochlorothiazide (MAXZIDE-25) 37.5-25 MG tablet, Take 1 tablet by mouth daily., Disp: 90 tablet, Rfl: 3 .  naproxen sodium (ANAPROX) 220 MG tablet, Take 220 mg by mouth 2 (two) times daily with a meal., Disp: , Rfl:  .  sulfamethoxazole-trimethoprim (BACTRIM DS,SEPTRA DS) 800-160 MG tablet, Take 1 tablet by mouth 2 (two) times daily. (Patient not taking: Reported on 02/04/2017), Disp: 14 tablet, Rfl: 0  Review of Systems  Constitutional: Negative for chills, diaphoresis, fatigue and fever.  HENT: Positive  for rhinorrhea. Negative for congestion, ear discharge, ear pain, nosebleeds, postnasal drip, sinus pain, sinus pressure, sneezing and sore throat.        Sore in right nostril  Respiratory: Negative for cough, chest tightness and shortness of breath.   Cardiovascular: Negative for chest pain, palpitations and leg swelling.  Gastrointestinal: Negative for abdominal pain.  Neurological: Negative for dizziness and headaches.    Social History  Substance Use Topics  . Smoking status: Never Smoker  . Smokeless tobacco: Never Used  . Alcohol use 1.8 oz/week    3 Glasses of wine per week     Comment: OCCASIONAL   Objective:   BP (!) 142/70 (BP Location: Left Arm, Patient Position: Sitting, Cuff Size: Large)   Pulse 80   Temp 98.2 F (36.8 C) (Oral)   Resp 16   Wt 160 lb 6.4 oz (72.8 kg)   BMI 26.69 kg/m     Physical Exam  Constitutional: She appears well-developed and well-nourished. No distress.  HENT:  Head: Normocephalic and atraumatic.  Right Ear: Hearing, tympanic membrane, external ear and ear canal normal.  Left Ear: Hearing, tympanic membrane, external ear and ear canal normal.  Nose: Rhinorrhea present. No mucosal edema or nose lacerations (ulceration noted in the right nostril, lateral side with erythematous border and mucous noted centrally). Right sinus exhibits no maxillary  sinus tenderness and no frontal sinus tenderness. Left sinus exhibits no maxillary sinus tenderness and no frontal sinus tenderness.  Mouth/Throat: Uvula is midline, oropharynx is clear and moist and mucous membranes are normal. No oropharyngeal exudate.  Eyes: Pupils are equal, round, and reactive to light. Conjunctivae are normal. Right eye exhibits no discharge. Left eye exhibits no discharge. No scleral icterus.  Neck: Normal range of motion. Neck supple. No tracheal deviation present. No thyromegaly present.  Cardiovascular: Normal rate, regular rhythm and normal heart sounds.  Exam reveals no  gallop and no friction rub.   No murmur heard. Pulmonary/Chest: Effort normal and breath sounds normal. No stridor. No respiratory distress. She has no wheezes. She has no rales.  Lymphadenopathy:    She has no cervical adenopathy.  Skin: Skin is warm and dry. She is not diaphoretic.  Vitals reviewed.       Assessment & Plan:     1. Open wound of nasal cavity without complication, initial encounter Will give bacitracin as below. Patient is to use for 1-2 weeks and call if no improvements. If no changes I will refer to ENT for further evaluation.  - bacitracin (RA BACITRACIN) ointment; Apply 1 application topically 2 (two) times daily.  Dispense: 120 g; Refill: 0  2. Depression, major, single episode, moderate (HCC) Switching back from celexa to sertraline per pateint request. Stable. Diagnosis pulled for medication refill. Continue current medical treatment plan. - sertraline (ZOLOFT) 100 MG tablet; Take 2 tablets (200 mg total) by mouth daily.  Dispense: 180 tablet; Refill: Soquel, PA-C  [facility]

## 2017-02-04 NOTE — Patient Instructions (Signed)
Bacitracin skin ointment What is this medicine? BACITRACIN (bass i TRAY sin) is a polypeptide antibiotic. It is used to treat bacterial skin infections or to prevent infection of minor burns, cuts, or scrapes. This medicine Bottari be used for other purposes; ask your health care provider or pharmacist if you have questions. What should I tell my health care provider before I take this medicine? They need to know if you have any of these conditions: -animal bites -large areas of damaged skin -puncture wound -severe burns -an unusual or allergic reaction to bacitracin, neomycin, other antibiotics, other medicines, foods, dyes, or preservatives -pregnant or trying to get pregnant -breast-feeding How should I use this medicine? This medicine is only for external use on the skin. Follow the directions on the prescription label. Wash hands before and after use. Apply a thin layer to cover the affected area. You can cover the treated area with a sterile gauze dressing (bandage). Use this medicine at regular intervals. Do not use more often than directed. Finish the full course prescribed by your doctor or health care professional even if you think you are better. Do not stop using except on your doctor's advice. Talk to your pediatrician regarding the use of this medicine in children. Special care Jennette be needed. Overdosage: If you think you have taken too much of this medicine contact a poison control center or emergency room at once. NOTE: This medicine is only for you. Do not share this medicine with others. What if I miss a dose? If you miss a dose, use it as soon as you can. If it is almost time for your next dose, use only that dose. Do not use double or extra doses. What Sahm interact with this medicine? Interactions are not expected. Do not use other skin care products unless your doctor or health care professional tells you to. This list Nevel not describe all possible interactions. Give your health  care provider a list of all the medicines, herbs, non-prescription drugs, or dietary supplements you use. Also tell them if you smoke, drink alcohol, or use illegal drugs. Some items Peeks interact with your medicine. What should I watch for while using this medicine? Tell your doctor or health care professional if the infection does not get better within 1 week or if they get worse. Do not get this medicine in your eyes. If you do, rinse out with plenty of cool tap water. What side effects Pilz I notice from receiving this medicine? Side effects that you should report to your doctor or health care professional as soon as possible: -allergic reactions like skin rash, itching or hives, swelling of the face, lips, or tongue -breathing problems -chest tightness -lower back pain -pain, difficulty passing urine Side effects that usually do not require medical attention (report to your doctor or health care professional if they continue or are bothersome): -skin irritation This list Poteete not describe all possible side effects. Call your doctor for medical advice about side effects. You Hiscox report side effects to FDA at 1-800-FDA-1088. Where should I keep my medicine? Keep out of the reach of children. Store at room temperature between 15 and 30 degrees C (59 and 86 degrees F). Protect from light. Throw away any unused medicine after the expiration date. NOTE: This sheet is a summary. It Mata not cover all possible information. If you have questions about this medicine, talk to your doctor, pharmacist, or health care provider.  2018 Elsevier/Gold Standard (2007-08-24 14:59:46)

## 2017-02-25 DIAGNOSIS — H353212 Exudative age-related macular degeneration, right eye, with inactive choroidal neovascularization: Secondary | ICD-10-CM | POA: Diagnosis not present

## 2017-03-02 DIAGNOSIS — H353212 Exudative age-related macular degeneration, right eye, with inactive choroidal neovascularization: Secondary | ICD-10-CM | POA: Diagnosis not present

## 2017-03-03 ENCOUNTER — Other Ambulatory Visit: Payer: Self-pay | Admitting: Physician Assistant

## 2017-03-03 DIAGNOSIS — F419 Anxiety disorder, unspecified: Secondary | ICD-10-CM

## 2017-03-03 NOTE — Telephone Encounter (Signed)
Called into [facility] 

## 2017-03-11 ENCOUNTER — Ambulatory Visit (INDEPENDENT_AMBULATORY_CARE_PROVIDER_SITE_OTHER): Payer: Medicare Other | Admitting: Physician Assistant

## 2017-03-11 ENCOUNTER — Encounter: Payer: Self-pay | Admitting: Physician Assistant

## 2017-03-11 VITALS — BP 140/70 | HR 77 | Temp 97.8°F | Resp 16 | Wt 161.0 lb

## 2017-03-11 DIAGNOSIS — J3489 Other specified disorders of nose and nasal sinuses: Secondary | ICD-10-CM

## 2017-03-11 DIAGNOSIS — F3341 Major depressive disorder, recurrent, in partial remission: Secondary | ICD-10-CM

## 2017-03-11 NOTE — Patient Instructions (Signed)
Alprazolam tablets What is this medicine? ALPRAZOLAM (al PRAY zoe lam) is a benzodiazepine. It is used to treat anxiety and panic attacks. This medicine Gedeon be used for other purposes; ask your health care provider or pharmacist if you have questions. COMMON BRAND NAME(S): Xanax What should I tell my health care provider before I take this medicine? They need to know if you have any of these conditions: -an alcohol or drug abuse problem -bipolar disorder, depression, psychosis or other mental health conditions -glaucoma -kidney or liver disease -lung or breathing disease -myasthenia gravis -Parkinson's disease -porphyria -seizures or a history of seizures -suicidal thoughts -an unusual or allergic reaction to alprazolam, other benzodiazepines, foods, dyes, or preservatives -pregnant or trying to get pregnant -breast-feeding How should I use this medicine? Take this medicine by mouth with a glass of water. Follow the directions on the prescription label. Take your medicine at regular intervals. Do not take it more often than directed. Do not stop taking except on your doctor's advice. A special MedGuide will be given to you by the pharmacist with each prescription and refill. Be sure to read this information carefully each time. Talk to your pediatrician regarding the use of this medicine in children. Special care Santino be needed. Overdosage: If you think you have taken too much of this medicine contact a poison control center or emergency room at once. NOTE: This medicine is only for you. Do not share this medicine with others. What if I miss a dose? If you miss a dose, take it as soon as you can. If it is almost time for your next dose, take only that dose. Do not take double or extra doses. What Quaranta interact with this medicine? Do not take this medicine with any of the following medications: -certain antiviral medicines for HIV or AIDS like delavirdine, indinavir -certain medicines for  fungal infections like ketoconazole and itraconazole -narcotic medicines for cough -sodium oxybate This medicine Hacking also interact with the following medications: -alcohol -antihistamines for allergy, cough and cold -certain antibiotics like clarithromycin, erythromycin, isoniazid, rifampin, rifapentine, rifabutin, and troleandomycin -certain medicines for blood pressure, heart disease, irregular heart beat -certain medicines for depression, like amitriptyline, fluoxetine, sertraline -certain medicines for seizures like carbamazepine, oxcarbazepine, phenobarbital, phenytoin, primidone -cimetidine -cyclosporine -female hormones, like estrogens or progestins and birth control pills, patches, rings, or injections -general anesthetics like halothane, isoflurane, methoxyflurane, propofol -grapefruit juice -local anesthetics like lidocaine, pramoxine, tetracaine -medicines that relax muscles for surgery -narcotic medicines for pain -other antiviral medicines for HIV or AIDS -phenothiazines like chlorpromazine, mesoridazine, prochlorperazine, thioridazine This list Slatter not describe all possible interactions. Give your health care provider a list of all the medicines, herbs, non-prescription drugs, or dietary supplements you use. Also tell them if you smoke, drink alcohol, or use illegal drugs. Some items Simon interact with your medicine. What should I watch for while using this medicine? Tell your doctor or health care professional if your symptoms do not start to get better or if they get worse. Do not stop taking except on your doctor's advice. You Bigbee develop a severe reaction. Your doctor will tell you how much medicine to take. You Orban get drowsy or dizzy. Do not drive, use machinery, or do anything that needs mental alertness until you know how this medicine affects you. To reduce the risk of dizzy and fainting spells, do not stand or sit up quickly, especially if you are an older patient.  Alcohol Mcdougle increase dizziness and drowsiness. Avoid alcoholic  drinks. If you are taking another medicine that also causes drowsiness, you Weiler have more side effects. Give your health care provider a list of all medicines you use. Your doctor will tell you how much medicine to take. Do not take more medicine than directed. Call emergency for help if you have problems breathing or unusual sleepiness. What side effects Suttles I notice from receiving this medicine? Side effects that you should report to your doctor or health care professional as soon as possible: -allergic reactions like skin rash, itching or hives, swelling of the face, lips, or tongue -breathing problems -confusion -loss of balance or coordination -signs and symptoms of low blood pressure like dizziness; feeling faint or lightheaded, falls; unusually weak or tired -suicidal thoughts or other mood changes Side effects that usually do not require medical attention (report to your doctor or health care professional if they continue or are bothersome): -dizziness -dry mouth -nausea, vomiting -tiredness This list Chovan not describe all possible side effects. Call your doctor for medical advice about side effects. You Dutta report side effects to FDA at 1-800-FDA-1088. Where should I keep my medicine? Keep out of the reach of children. This medicine can be abused. Keep your medicine in a safe place to protect it from theft. Do not share this medicine with anyone. Selling or giving away this medicine is dangerous and against the law. Store at room temperature between 20 and 25 degrees C (68 and 77 degrees F). This medicine Quiocho cause accidental overdose and death if taken by other adults, children, or pets. Mix any unused medicine with a substance like cat litter or coffee grounds. Then throw the medicine away in a sealed container like a sealed bag or a coffee can with a lid. Do not use the medicine after the expiration date. NOTE: This sheet is  a summary. It Eisenhart not cover all possible information. If you have questions about this medicine, talk to your doctor, pharmacist, or health care provider.  2018 Elsevier/Gold Standard (2015-02-21 13:47:25)  

## 2017-03-11 NOTE — Progress Notes (Signed)
Patient: Jordan Gordon Female    DOB: [birth year]   83 y.o.   MRN: [id number] Visit Date: 03/11/2017  Today's Provider: Mar Daring, PA-C   Chief Complaint  Patient presents with  . Follow-up    Depression   Subjective:    HPI  Depression, Follow up:  The patient was last seen for Depression 1 months ago. Changes made since that visit include switch back to Sertraline from Celexa per pt request .  She reports excellent compliance with treatment. She is not having side effects. Marland Kitchen ------------------------------------------------------------------------  Patient is requesting her A1C to be check. She reports that she is urinating more frequently, and craving a lot of sweets.    Allergies  Allergen Reactions  . Codeine     nausea     Current Outpatient Prescriptions:  .  ALPRAZolam (XANAX) 0.5 MG tablet, TAKE 1 TABLET BY MOUTH 3 TIMES DAILY AS NEEDED FOR ANXIETY, Disp: 90 tablet, Rfl: 5 .  bacitracin (RA BACITRACIN) ointment, Apply 1 application topically 2 (two) times daily., Disp: 120 g, Rfl: 0 .  LUMIGAN 0.01 % SOLN, Place 1 drop into both eyes at bedtime. , Disp: , Rfl:  .  potassium chloride SA (K-DUR,KLOR-CON) 20 MEQ tablet, TAKE ONE TABLET EVERY DAY, Disp: 90 tablet, Rfl: 3 .  sertraline (ZOLOFT) 100 MG tablet, Take 2 tablets (200 mg total) by mouth daily., Disp: 180 tablet, Rfl: 1 .  timolol (TIMOPTIC) 0.5 % ophthalmic solution, Place 1 drop into both eyes. , Disp: , Rfl:  .  tobramycin-dexamethasone (TOBRADEX) ophthalmic solution, , Disp: , Rfl:  .  traZODone (DESYREL) 100 MG tablet, TAKE ONE TABLET BY MOUTH EVERY EVENING AT BEDTIME, Disp: 90 tablet, Rfl: 3 .  triamterene-hydrochlorothiazide (MAXZIDE-25) 37.5-25 MG tablet, Take 1 tablet by mouth daily., Disp: 90 tablet, Rfl: 3 .  naproxen sodium (ANAPROX) 220 MG tablet, Take 220 mg by mouth 2 (two) times daily with a meal., Disp: , Rfl:  .  Pseudoephedrine HCl (SINUS & ALLERGY 12 HOUR PO), Take by  mouth., Disp: , Rfl:   Review of Systems  Constitutional: Negative for fatigue.  Respiratory: Negative for cough, chest tightness and shortness of breath.   Cardiovascular: Negative for chest pain, palpitations and leg swelling.  Gastrointestinal: Negative for abdominal pain.  Neurological: Negative for dizziness, light-headedness and headaches.  Psychiatric/Behavioral: Negative for agitation, dysphoric mood, sleep disturbance and suicidal ideas. The patient is not nervous/anxious.     Social History  Substance Use Topics  . Smoking status: Never Smoker  . Smokeless tobacco: Never Used  . Alcohol use 1.8 oz/week    3 Glasses of wine per week     Comment: OCCASIONAL   Objective:   BP 140/70 (BP Location: Left Arm, Patient Position: Sitting, Cuff Size: Normal)   Pulse 77   Temp 97.8 F (36.6 C) (Oral)   Resp 16   Wt 161 lb (73 kg)   SpO2 99%   BMI 26.79 kg/m    Physical Exam  Constitutional: She appears well-developed and well-nourished. No distress.  HENT:  Nose:    Neck: Normal range of motion. Neck supple. No JVD present. No tracheal deviation present. No thyromegaly present.  Cardiovascular: Normal rate, regular rhythm and normal heart sounds.  Exam reveals no gallop and no friction rub.   No murmur heard. Pulmonary/Chest: Effort normal and breath sounds normal. No respiratory distress. She has no wheezes. She has no rales.  Lymphadenopathy:    She  has no cervical adenopathy.  Skin: She is not diaphoretic.  Vitals reviewed.      Assessment & Plan:     1. Recurrent major depressive disorder, in partial remission ([place]) Improving with sertraline 200mg  daily and alprazolam 0.5mg  TID. She will schedule AWV in her timing. Will check labs and recheck nasal sore as below when she returns.  2. Nasal sore Improving with Bacitracin. If still present when she returns will refer to ENT.       Mar Daring, PA-C  [facility]

## 2017-04-13 ENCOUNTER — Ambulatory Visit (INDEPENDENT_AMBULATORY_CARE_PROVIDER_SITE_OTHER): Payer: Medicare Other | Admitting: Physician Assistant

## 2017-04-13 ENCOUNTER — Encounter: Payer: Self-pay | Admitting: Physician Assistant

## 2017-04-13 VITALS — BP 120/78 | HR 76 | Temp 97.8°F | Resp 16 | Wt 161.2 lb

## 2017-04-13 DIAGNOSIS — Z23 Encounter for immunization: Secondary | ICD-10-CM

## 2017-04-13 DIAGNOSIS — J3489 Other specified disorders of nose and nasal sinuses: Secondary | ICD-10-CM | POA: Diagnosis not present

## 2017-04-13 DIAGNOSIS — R413 Other amnesia: Secondary | ICD-10-CM

## 2017-04-13 NOTE — Progress Notes (Signed)
Patient: Jordan Gordon Female    DOB: September 14, 1933   83 y.o.   MRN: [id number] Visit Date: 04/13/2017  Today's Provider: Mar Daring, PA-C   Chief Complaint  Patient presents with  . Sore in nose   Subjective:    HPI She reports that she thinks she has "cancer" in her nose. She continues to have a sore noted in the right nare. She has been using bacitracin ointment with only minimal relief but no complete resolution. She reports the area is starting to cause her to have a funny smell in that nostril. She has a friend that was recently diagnosed with skin cancer in the nose and she is scared this is what is going on with her as well.   Of note: patient could not remember exactly why she has made the appointment today. She states she remembered I had told her to make an appointment (was suppose to have been her physical) but when she called she couldn't remember the reason so it was put that she was having weakness. When asked today about that she said she did not remember saying that and denies any weakness. States overall she feels well except for the sore in the nose.      Allergies  Allergen Reactions  . Codeine     nausea     Current Outpatient Medications:  .  ALPRAZolam (XANAX) 0.5 MG tablet, TAKE 1 TABLET BY MOUTH 3 TIMES DAILY AS NEEDED FOR ANXIETY, Disp: 90 tablet, Rfl: 5 .  bacitracin (RA BACITRACIN) ointment, Apply 1 application topically 2 (two) times daily., Disp: 120 g, Rfl: 0 .  LUMIGAN 0.01 % SOLN, Place 1 drop into both eyes at bedtime. , Disp: , Rfl:  .  naproxen sodium (ANAPROX) 220 MG tablet, Take 220 mg by mouth 2 (two) times daily with a meal., Disp: , Rfl:  .  potassium chloride SA (K-DUR,KLOR-CON) 20 MEQ tablet, TAKE ONE TABLET EVERY DAY, Disp: 90 tablet, Rfl: 3 .  Pseudoephedrine HCl (SINUS & ALLERGY 12 HOUR PO), Take by mouth., Disp: , Rfl:  .  sertraline (ZOLOFT) 100 MG tablet, Take 2 tablets (200 mg total) by mouth daily., Disp: 180 tablet, Rfl:  1 .  timolol (TIMOPTIC) 0.5 % ophthalmic solution, Place 1 drop into both eyes. , Disp: , Rfl:  .  tobramycin-dexamethasone (TOBRADEX) ophthalmic solution, , Disp: , Rfl:  .  traZODone (DESYREL) 100 MG tablet, TAKE ONE TABLET BY MOUTH EVERY EVENING AT BEDTIME, Disp: 90 tablet, Rfl: 3 .  triamterene-hydrochlorothiazide (MAXZIDE-25) 37.5-25 MG tablet, Take 1 tablet by mouth daily., Disp: 90 tablet, Rfl: 3  Review of Systems  Constitutional: Positive for fatigue.  Respiratory: Negative.   Cardiovascular: Negative.   Genitourinary: Negative.   Neurological: Negative for weakness.    Social History   Tobacco Use  . Smoking status: Never Smoker  . Smokeless tobacco: Never Used  Substance Use Topics  . Alcohol use: Yes    Alcohol/week: 1.8 oz    Types: 3 Glasses of wine per week    Comment: OCCASIONAL   Objective:   BP 120/78 (BP Location: Left Arm, Patient Position: Sitting, Cuff Size: Normal)   Pulse 76   Temp 97.8 F (36.6 C) (Oral)   Resp 16   Wt 161 lb 3.2 oz (73.1 kg)   SpO2 96%   BMI 26.83 kg/m    Physical Exam  Constitutional: She appears well-developed and well-nourished. No distress.  HENT:  Head:  Normocephalic and atraumatic.  Right Ear: Hearing, tympanic membrane, external ear and ear canal normal.  Left Ear: Hearing, tympanic membrane, external ear and ear canal normal.  Nose: Nose lacerations (not laceration but sore noted on medial right nare just past the opening) present.    Mouth/Throat: Uvula is midline, oropharynx is clear and moist and mucous membranes are normal. No oropharyngeal exudate.  Eyes: Conjunctivae are normal. Pupils are equal, round, and reactive to light. Right eye exhibits no discharge. Left eye exhibits no discharge. No scleral icterus.  Neck: Normal range of motion. Neck supple.  Cardiovascular: Normal rate, regular rhythm and normal heart sounds. Exam reveals no gallop and no friction rub.  No murmur heard. Pulmonary/Chest: Effort  normal and breath sounds normal. No respiratory distress. She has no wheezes. She has no rales.  Skin: Skin is warm and dry. She is not diaphoretic.  Vitals reviewed.        Assessment & Plan:     1. Sore in nose Not improving with bacitracin. Will refer to ENT for further evaluation.  - Ambulatory referral to ENT  2. Memory changes 6 CIT was borderline normal today. When patient returns for her AWV we will do a mini mental exam instead for further evaluation and discuss further at that time if testing is abnormal.   3. Need for influenza vaccination Flu vaccine given today without complication. Patient sat upright for 15 minutes to check for adverse reaction before being released. - Flu vaccine HIGH DOSE PF       Mar Daring, PA-C  [facility]

## 2017-04-27 DIAGNOSIS — J342 Deviated nasal septum: Secondary | ICD-10-CM | POA: Diagnosis not present

## 2017-04-27 DIAGNOSIS — J340 Abscess, furuncle and carbuncle of nose: Secondary | ICD-10-CM | POA: Diagnosis not present

## 2017-05-05 ENCOUNTER — Encounter: Payer: Self-pay | Admitting: Physician Assistant

## 2017-05-05 ENCOUNTER — Ambulatory Visit (INDEPENDENT_AMBULATORY_CARE_PROVIDER_SITE_OTHER): Payer: Medicare Other | Admitting: Physician Assistant

## 2017-05-05 ENCOUNTER — Other Ambulatory Visit: Payer: Self-pay

## 2017-05-05 VITALS — BP 150/76 | HR 89 | Temp 97.6°F | Resp 16 | Ht 65.0 in | Wt 160.0 lb

## 2017-05-05 DIAGNOSIS — Z6826 Body mass index (BMI) 26.0-26.9, adult: Secondary | ICD-10-CM

## 2017-05-05 DIAGNOSIS — F320 Major depressive disorder, single episode, mild: Secondary | ICD-10-CM

## 2017-05-05 DIAGNOSIS — E7800 Pure hypercholesterolemia, unspecified: Secondary | ICD-10-CM | POA: Diagnosis not present

## 2017-05-05 DIAGNOSIS — R413 Other amnesia: Secondary | ICD-10-CM | POA: Diagnosis not present

## 2017-05-05 DIAGNOSIS — I10 Essential (primary) hypertension: Secondary | ICD-10-CM | POA: Diagnosis not present

## 2017-05-05 DIAGNOSIS — F419 Anxiety disorder, unspecified: Secondary | ICD-10-CM | POA: Diagnosis not present

## 2017-05-05 DIAGNOSIS — R739 Hyperglycemia, unspecified: Secondary | ICD-10-CM

## 2017-05-05 DIAGNOSIS — N259 Disorder resulting from impaired renal tubular function, unspecified: Secondary | ICD-10-CM | POA: Diagnosis not present

## 2017-05-05 MED ORDER — CITALOPRAM HYDROBROMIDE 40 MG PO TABS: 40.0000 mg | ORAL_TABLET | Freq: Every day | ORAL | 1 refills | Status: DC

## 2017-05-05 NOTE — Patient Instructions (Signed)
Citalopram tablets What is this medicine? CITALOPRAM (sye TAL oh pram) is a medicine for depression. This medicine Kujala be used for other purposes; ask your health care provider or pharmacist if you have questions. COMMON BRAND NAME(S): Celexa What should I tell my health care provider before I take this medicine? They need to know if you have any of these conditions: -bleeding disorders -bipolar disorder or a family history of bipolar disorder -glaucoma -heart disease -history of irregular heartbeat -kidney disease -liver disease -low levels of magnesium or potassium in the blood -receiving electroconvulsive therapy -seizures -suicidal thoughts, plans, or attempt; a previous suicide attempt by you or a family member -take medicines that treat or prevent blood clots -thyroid disease -an unusual or allergic reaction to citalopram, escitalopram, other medicines, foods, dyes, or preservatives -pregnant or trying to become pregnant -breast-feeding How should I use this medicine? Take this medicine by mouth with a glass of water. Follow the directions on the prescription label. You can take it with or without food. Take your medicine at regular intervals. Do not take your medicine more often than directed. Do not stop taking this medicine suddenly except upon the advice of your doctor. Stopping this medicine too quickly Blatchley cause serious side effects or your condition Smedberg worsen. A special MedGuide will be given to you by the pharmacist with each prescription and refill. Be sure to read this information carefully each time. Talk to your pediatrician regarding the use of this medicine in children. Special care Hoffart be needed. Patients over 60 years old Cerezo have a stronger reaction and need a smaller dose. Overdosage: If you think you have taken too much of this medicine contact a poison control center or emergency room at once. NOTE: This medicine is only for you. Do not share this medicine with  others. What if I miss a dose? If you miss a dose, take it as soon as you can. If it is almost time for your next dose, take only that dose. Do not take double or extra doses. What Bergeron interact with this medicine? Do not take this medicine with any of the following medications: -certain medicines for fungal infections like fluconazole, itraconazole, ketoconazole, posaconazole, voriconazole -cisapride -dofetilide -dronedarone -escitalopram -linezolid -MAOIs like Carbex, Eldepryl, Marplan, Nardil, and Parnate -methylene blue (injected into a vein) -pimozide -thioridazine -ziprasidone This medicine Saldierna also interact with the following medications: -alcohol -amphetamines -aspirin and aspirin-like medicines -carbamazepine -certain medicines for depression, anxiety, or psychotic disturbances -certain medicines for infections like chloroquine, clarithromycin, erythromycin, furazolidone, isoniazid, pentamidine -certain medicines for migraine headaches like almotriptan, eletriptan, frovatriptan, naratriptan, rizatriptan, sumatriptan, zolmitriptan -certain medicines for sleep -certain medicines that treat or prevent blood clots like dalteparin, enoxaparin, warfarin -cimetidine -diuretics -fentanyl -lithium -methadone -metoprolol -NSAIDs, medicines for pain and inflammation, like ibuprofen or naproxen -omeprazole -other medicines that prolong the QT interval (cause an abnormal heart rhythm) -procarbazine -rasagiline -supplements like St. [name]'s wort, kava kava, valerian -tramadol -tryptophan This list Greaves not describe all possible interactions. Give your health care provider a list of all the medicines, herbs, non-prescription drugs, or dietary supplements you use. Also tell them if you smoke, drink alcohol, or use illegal drugs. Some items Thurlow interact with your medicine. What should I watch for while using this medicine? Tell your doctor if your symptoms do not get better or if they  get worse. Visit your doctor or health care professional for regular checks on your progress. Because it Caruth take several weeks to see the full   effects of this medicine, it is important to continue your treatment as prescribed by your doctor. Patients and their families should watch out for new or worsening thoughts of suicide or depression. Also watch out for sudden changes in feelings such as feeling anxious, agitated, panicky, irritable, hostile, aggressive, impulsive, severely restless, overly excited and hyperactive, or not being able to sleep. If this happens, especially at the beginning of treatment or after a change in dose, call your health care professional. You Ogren get drowsy or dizzy. Do not drive, use machinery, or do anything that needs mental alertness until you know how this medicine affects you. Do not stand or sit up quickly, especially if you are an older patient. This reduces the risk of dizzy or fainting spells. Alcohol Yun interfere with the effect of this medicine. Avoid alcoholic drinks. Your mouth Hostetter get dry. Chewing sugarless gum or sucking hard candy, and drinking plenty of water will help. Contact your doctor if the problem does not go away or is severe. What side effects Kosman I notice from receiving this medicine? Side effects that you should report to your doctor or health care professional as soon as possible: -allergic reactions like skin rash, itching or hives, swelling of the face, lips, or tongue -anxious -black, tarry stools -breathing problems -changes in vision -chest pain -confusion -elevated mood, decreased need for sleep, racing thoughts, impulsive behavior -eye pain -fast, irregular heartbeat -feeling faint or lightheaded, falls -feeling agitated, angry, or irritable -hallucination, loss of contact with reality -loss of balance or coordination -loss of memory -painful or prolonged erections -restlessness, pacing, inability to keep  still -seizures -stiff muscles -suicidal thoughts or other mood changes -trouble sleeping -unusual bleeding or bruising -unusually weak or tired -vomiting Side effects that usually do not require medical attention (report to your doctor or health care professional if they continue or are bothersome): -change in appetite or weight -change in sex drive or performance -dizziness -headache -increased sweating -indigestion, nausea -tremors This list Jaime not describe all possible side effects. Call your doctor for medical advice about side effects. You Pixley report side effects to FDA at 1-800-FDA-1088. Where should I keep my medicine? Keep out of reach of children. Store at room temperature between 15 and 30 degrees C (59 and 86 degrees F). Throw away any unused medicine after the expiration date. NOTE: This sheet is a summary. It Meiser not cover all possible information. If you have questions about this medicine, talk to your doctor, pharmacist, or health care provider.  2018 Elsevier/Gold Standard (2015-10-28 13:18:52)  

## 2017-05-05 NOTE — Progress Notes (Signed)
Patient: Jordan Gordon, Female    DOB: 1933/12/12, 83 y.o.   MRN: [id number] Visit Date: 05/05/2017  Today's Provider: Mar Daring, PA-C   Chief Complaint  Patient presents with  . Medicare Wellness   Subjective:   Jordan Gordon is a 83 y.o. female. She feels fairly well. She reports exercising none . She reports she is sleeping fairly well. 07/08/16 AWE 09/26/15 Mammogram-BI-RADS 1 10/19/16 Colonoscopy-polyps, path-Tubular adenoma  Patient has been having increasing memory loss. She was to have a MMSE today but patient is refusing.  ----------------------------------------------------------- Patient C/O cough x's 5-6 days.   Review of Systems  Constitutional: Positive for fatigue.  HENT: Positive for congestion, postnasal drip, sinus pressure and sneezing.   Eyes: Positive for itching.  Respiratory: Positive for cough and shortness of breath.   Cardiovascular: Negative.   Gastrointestinal: Positive for abdominal distention and constipation.  Endocrine: Negative.   Genitourinary: Negative.   Musculoskeletal: Positive for back pain.  Skin: Negative.   Allergic/Immunologic: Negative.   Neurological: Negative.   Hematological: Negative.   Psychiatric/Behavioral: Negative.     Social History   Socioeconomic History  . Marital status: Widowed    Spouse name: Not on file  . Number of children: Not on file  . Years of education: Not on file  . Highest education level: Not on file  Social Needs  . Financial resource strain: Not on file  . Food insecurity - worry: Not on file  . Food insecurity - inability: Not on file  . Transportation needs - medical: Not on file  . Transportation needs - non-medical: Not on file  Occupational History  . Not on file  Tobacco Use  . Smoking status: Never Smoker  . Smokeless tobacco: Never Used  Substance and Sexual Activity  . Alcohol use: Yes    Alcohol/week: 1.8 oz    Types: 3 Glasses of wine per week    Comment:  OCCASIONAL  . Drug use: No  . Sexual activity: Not on file  Other Topics Concern  . Not on file  Social History Narrative  . Not on file    Past Medical History:  Diagnosis Date  . Anxiety   . Hyperlipidemia   . Hypertension   . Major depressive disorder      Patient Active Problem List   Diagnosis Date Noted  . Mechanical and motor problems with internal organs 04/18/2015  . Abnormal foot pulse 04/18/2015  . Clinical depression 04/18/2015  . Fatigue 04/18/2015  . Blood glucose elevated 04/18/2015  . Decreased potassium in the blood 04/18/2015  . LBP (low back pain) 04/18/2015  . Burning or prickling sensation 04/18/2015  . Esophagitis, reflux 04/18/2015  . Anxiety 01/07/2015  . Chest pain 09/20/2014  . History of cardiac catheterization 03/08/2014  . Disorder resulting from impaired renal function 07/29/2009  . Muscle ache 03/15/2009  . Breath shortness 09/26/2008  . Adaptation reaction 07/21/2006  . Arthritis, degenerative 07/21/2006  . 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 Essential (primary) hypertension 04/16/1998  . Allergic rhinitis 03/12/1998  . Insomnia 03/04/1998  . Menopausal and perimenopausal disorder 02/02/1997  . HLD (hyperlipidemia) 12/06/1991    Past Surgical History:  Procedure Laterality Date  . ABDOMINAL HYSTERECTOMY  1979  . BREAST EXCISIONAL BIOPSY Left 2005   benign  . COLONOSCOPY WITH PROPOFOL N/A 07/22/2015   Procedure: COLONOSCOPY WITH PROPOFOL;  Surgeon: Manya Silvas, MD;  Location: [facility] ENDOSCOPY;  Service: Endoscopy;  Laterality: N/A;  . COLONOSCOPY WITH PROPOFOL N/A 10/19/2016   Procedure: COLONOSCOPY WITH PROPOFOL;  Surgeon: Manya Silvas, MD;  Location: [facility] ENDOSCOPY;  Service: Endoscopy;  Laterality: N/A;  . KNEE SURGERY Left 2011   2007  . TONSILLECTOMY  1940   ADENOIDS    Her family history includes Cancer in her brother and mother; Heart disease in her father.      Current Outpatient Medications:  .   ALPRAZolam (XANAX) 0.5 MG tablet, TAKE 1 TABLET BY MOUTH 3 TIMES DAILY AS NEEDED FOR ANXIETY, Disp: 90 tablet, Rfl: 5 .  bacitracin (RA BACITRACIN) ointment, Apply 1 application topically 2 (two) times daily., Disp: 120 g, Rfl: 0 .  LUMIGAN 0.01 % SOLN, Place 1 drop into both eyes at bedtime. , Disp: , Rfl:  .  naproxen sodium (ANAPROX) 220 MG tablet, Take 220 mg by mouth 2 (two) times daily with a meal., Disp: , Rfl:  .  potassium chloride SA (K-DUR,KLOR-CON) 20 MEQ tablet, TAKE ONE TABLET EVERY DAY, Disp: 90 tablet, Rfl: 3 .  Pseudoephedrine HCl (SINUS & ALLERGY 12 HOUR PO), Take by mouth., Disp: , Rfl:  .  sertraline (ZOLOFT) 100 MG tablet, Take 2 tablets (200 mg total) by mouth daily., Disp: 180 tablet, Rfl: 1 .  timolol (TIMOPTIC) 0.5 % ophthalmic solution, Place 1 drop into both eyes. , Disp: , Rfl:  .  tobramycin-dexamethasone (TOBRADEX) ophthalmic solution, , Disp: , Rfl:  .  traZODone (DESYREL) 100 MG tablet, TAKE ONE TABLET BY MOUTH EVERY EVENING AT BEDTIME, Disp: 90 tablet, Rfl: 3 .  triamterene-hydrochlorothiazide (MAXZIDE-25) 37.5-25 MG tablet, Take 1 tablet by mouth daily., Disp: 90 tablet, Rfl: 3  Patient Care Team: Mar Daring, PA-C as PCP - General (Family Medicine) Dingeldein, Remo Lipps, MD as Consulting Physician (Ophthalmology) Eulogio Bear, MD as Consulting Physician (Ophthalmology) Isaias Cowman, MD as Consulting Physician (Cardiology) Manya Silvas, MD as Consulting Physician (Gastroenterology)     Objective:   Vitals: There were no vitals taken for this visit.  Physical Exam  Constitutional: She is oriented to person, place, and time. She appears well-developed and well-nourished. No distress.  HENT:  Head: Normocephalic and atraumatic.  Right Ear: Hearing, tympanic membrane, external ear and ear canal normal.  Left Ear: Hearing, tympanic membrane, external ear and ear canal normal.  Nose: Nose normal.  Mouth/Throat: Uvula is midline,  oropharynx is clear and moist and mucous membranes are normal. No oropharyngeal exudate.  Eyes: Conjunctivae and EOM are normal. Pupils are equal, round, and reactive to light. Right eye exhibits no discharge. Left eye exhibits no discharge. No scleral icterus.  Neck: Normal range of motion. Neck supple. No JVD present. Carotid bruit is not present. No tracheal deviation present. No thyromegaly present.  Cardiovascular: Normal rate, regular rhythm, normal heart sounds and intact distal pulses. Exam reveals no gallop and no friction rub.  No murmur heard. Pulmonary/Chest: Effort normal and breath sounds normal. No respiratory distress. She has no wheezes. She has no rales. She exhibits no tenderness. Right breast exhibits no inverted nipple, no mass, no nipple discharge, no skin change and no tenderness. Left breast exhibits inverted nipple (chronically inverted). Left breast exhibits no mass, no nipple discharge, no skin change and no tenderness. Breasts are symmetrical.  Abdominal: Soft. Bowel sounds are normal. She exhibits no distension and no mass. There is no tenderness. There is no rebound and no guarding.  Musculoskeletal: Normal range of motion. She exhibits no edema or tenderness.  Lymphadenopathy:  She has no cervical adenopathy.  Neurological: She is alert and oriented to person, place, and time.  Skin: Skin is warm and dry. No rash noted. She is not diaphoretic.  Psychiatric: She has a normal mood and affect. Her behavior is normal. Judgment and thought content normal.  Vitals reviewed.   Activities of Daily Living In your present state of health, do you have any difficulty performing the following activities: 07/09/2016  Hearing? Y  Vision? Y  Comment MD in R eye  Difficulty concentrating or making decisions? N  Walking or climbing stairs? N  Dressing or bathing? N  Doing errands, shopping? N  Preparing Food and eating ? N  Using the Toilet? N  In the past six months, have you  accidently leaked urine? N  Do you have problems with loss of bowel control? N  Managing your Medications? N  Managing your Finances? N  Housekeeping or managing your Housekeeping? N  Some recent data might be hidden    Fall Risk Assessment Fall Risk  07/09/2016 07/09/2015  Falls in the past year? Yes No  Number falls in past yr: 1 -  Injury with Fall? No -  Follow up Falls prevention discussed -     Depression Screen PHQ 2/9 Scores 07/09/2016 07/09/2015  PHQ - 2 Score 1 0      Assessment & Plan:   Exercise Activities and Dietary recommendations Goals    None      Immunization History  Administered Date(s) Administered  . Influenza, High Dose Seasonal PF 03/04/2015, 04/13/2017  . Influenza-Unspecified 03/18/2016  . Pneumococcal Conjugate-13 01/24/2014  . Pneumococcal Polysaccharide-23 04/18/2015  . Td 03/07/1999  . Zoster 03/24/2012    Health Maintenance  Topic Date Due  . Samul Dada  03/06/2009  . DEXA SCAN  06/08/2026 (Originally 10/21/2000)  . INFLUENZA VACCINE  Completed  . PNA vac Low Risk Adult  Completed    1. Essential (primary) hypertension Stable today. Continue Maxzide 37.5-25mg . Will check labs as below and f/u pending results. - CBC w/Diff/Platelet - TSH - COMPLETE METABOLIC PANEL WITH GFR - Lipid Profile - HgB A1c  2. Blood glucose elevated H/O this. Diet controlled. Will check labs as below and f/u pending results. - COMPLETE METABOLIC PANEL WITH GFR - Lipid Profile - HgB A1c  3. Disorder resulting from impaired renal function Will check labs as below and f/u pending results. - COMPLETE METABOLIC PANEL WITH GFR  4. Pure hypercholesterolemia Diet controlled. Will check labs as below and f/u pending results. - COMPLETE METABOLIC PANEL WITH GFR - Lipid Profile - HgB A1c  5. BMI 26.0-26.9,adult Counseled patient on healthy lifestyle modifications including dieting and exercise.  - COMPLETE METABOLIC PANEL WITH GFR - Lipid Profile -  HgB A1c  6. Depression, major, single episode, mild (HCC) Will change therapy from Sertraline to Citalopram as below. Patient is to call if she has adverse reactions. I will see her back in 3 months.  - citalopram (CELEXA) 40 MG tablet; Take 1 tablet (40 mg total) by mouth daily.  Dispense: 30 tablet; Refill: 1 - TSH  7. Memory loss I feel memory has been worsening some for patient. She refuses memory testing or referral to Neurology today. I will see her back in 3 months to see if we can obtain MMSE at that time.  - TSH  8. Anxiety Switching to Citalopram from sertraline. Also uses Alprazolam 0.5mg  TID prn.  Will check labs as below and f/u pending results. - TSH  ------------------------------------------------------------------------------------------------------------  Mar Daring, PA-C  [facility]

## 2017-05-06 LAB — CBC WITH DIFFERENTIAL/PLATELET
BASOS ABS: 28 {cells}/uL (ref 0–200)
Basophils Relative: 0.6 %
Eosinophils Absolute: 141 cells/uL (ref 15–500)
Eosinophils Relative: 3 %
HEMATOCRIT: 39.5 % (ref 35.0–45.0)
HEMOGLOBIN: 14 g/dL (ref 11.7–15.5)
LYMPHS ABS: 1288 {cells}/uL (ref 850–3900)
MCH: 30.3 pg (ref 27.0–33.0)
MCHC: 35.4 g/dL (ref 32.0–36.0)
MCV: 85.5 fL (ref 80.0–100.0)
MPV: 11.7 fL (ref 7.5–12.5)
Monocytes Relative: 6.2 %
NEUTROS ABS: 2952 {cells}/uL (ref 1500–7800)
NEUTROS PCT: 62.8 %
Platelets: 201 10*3/uL (ref 140–400)
RBC: 4.62 10*6/uL (ref 3.80–5.10)
RDW: 12.9 % (ref 11.0–15.0)
Total Lymphocyte: 27.4 %
WBC: 4.7 10*3/uL (ref 3.8–10.8)
WBCMIX: 291 {cells}/uL (ref 200–950)

## 2017-05-06 LAB — COMPLETE METABOLIC PANEL WITH GFR
AG Ratio: 1.7 (calc) (ref 1.0–2.5)
ALBUMIN MSPROF: 4.2 g/dL (ref 3.6–5.1)
ALKALINE PHOSPHATASE (APISO): 65 U/L (ref 33–130)
ALT: 24 U/L (ref 6–29)
AST: 25 U/L (ref 10–35)
BUN/Creatinine Ratio: 19 (calc) (ref 6–22)
BUN: 19 mg/dL (ref 7–25)
CHLORIDE: 102 mmol/L (ref 98–110)
CO2: 31 mmol/L (ref 20–32)
CREATININE: 0.98 mg/dL — AB (ref 0.60–0.88)
Calcium: 9.5 mg/dL (ref 8.6–10.4)
GFR, Est African American: 63 mL/min/{1.73_m2} (ref >=60)
GFR, Est Non African American: 54 mL/min/{1.73_m2} — ABNORMAL LOW (ref >=60)
GLOBULIN: 2.5 g/dL (ref 1.9–3.7)
Glucose, Bld: 104 mg/dL — ABNORMAL HIGH (ref 65–99)
Potassium: 3.3 mmol/L — ABNORMAL LOW (ref 3.5–5.3)
Sodium: 141 mmol/L (ref 135–146)
TOTAL PROTEIN: 6.7 g/dL (ref 6.1–8.1)
Total Bilirubin: 0.5 mg/dL (ref 0.2–1.2)

## 2017-05-06 LAB — TSH: TSH: 2.23 mIU/L (ref 0.40–4.50)

## 2017-05-06 LAB — LIPID PANEL
CHOL/HDL RATIO: 4 (calc) (ref <5.0)
CHOLESTEROL: 253 mg/dL — AB (ref <200)
HDL: 63 mg/dL (ref >50)
LDL CHOLESTEROL (CALC): 167 mg/dL — AB
Non-HDL Cholesterol (Calc): 190 mg/dL (calc) — ABNORMAL HIGH (ref <130)
TRIGLYCERIDES: 107 mg/dL (ref <150)

## 2017-05-06 LAB — HEMOGLOBIN A1C
EAG (MMOL/L): 5.8 (calc)
Hgb A1c MFr Bld: 5.3 % of total Hgb (ref <5.7)
MEAN PLASMA GLUCOSE: 105 (calc)

## 2017-05-07 ENCOUNTER — Telehealth: Payer: Self-pay

## 2017-05-07 NOTE — Telephone Encounter (Signed)
Patient advised as directed below. She reports she is going to work on lowering her cholesterol.Reports that she doesn't do good with cholesterol medication.  Thanks,  -Izaiha Lo

## 2017-05-07 NOTE — Telephone Encounter (Signed)
-----   Message from Mar Daring, [place] sent at 05/06/2017  3:45 PM EST ----- Cholesterol is elevated and your ascvd 10 yr risk is high at 40.41%, meaning you could have a 40.41% chance to have cardiovascular event in the next 10 years. I would recommend to start a cholesterol lowering medication if patient agreeable. Also potassium is low so make sure to be taking your potassium supplement twice daily. We can recheck potassium in 4 weeks. All other labs are WNL.

## 2017-06-15 DIAGNOSIS — H353212 Exudative age-related macular degeneration, right eye, with inactive choroidal neovascularization: Secondary | ICD-10-CM | POA: Diagnosis not present

## 2017-06-15 DIAGNOSIS — H353122 Nonexudative age-related macular degeneration, left eye, intermediate dry stage: Secondary | ICD-10-CM | POA: Diagnosis not present

## 2017-06-17 ENCOUNTER — Telehealth: Payer: Self-pay

## 2017-06-17 NOTE — Telephone Encounter (Signed)
Error: Pt had AMW with PCP in Novemeber of 2018. Will wait until 04/2018 to schedule both AWV and CPE so apts are closer together.  -MM

## 2017-06-24 DIAGNOSIS — H401131 Primary open-angle glaucoma, bilateral, mild stage: Secondary | ICD-10-CM | POA: Diagnosis not present

## 2017-06-28 ENCOUNTER — Telehealth: Payer: Self-pay | Admitting: Physician Assistant

## 2017-06-28 DIAGNOSIS — Z9889 Other specified postprocedural states: Secondary | ICD-10-CM | POA: Diagnosis not present

## 2017-06-28 DIAGNOSIS — R0789 Other chest pain: Secondary | ICD-10-CM | POA: Diagnosis not present

## 2017-06-28 DIAGNOSIS — I10 Essential (primary) hypertension: Secondary | ICD-10-CM | POA: Diagnosis not present

## 2017-06-28 DIAGNOSIS — E7800 Pure hypercholesterolemia, unspecified: Secondary | ICD-10-CM | POA: Diagnosis not present

## 2017-06-28 DIAGNOSIS — R0602 Shortness of breath: Secondary | ICD-10-CM | POA: Diagnosis not present

## 2017-06-28 DIAGNOSIS — R079 Chest pain, unspecified: Secondary | ICD-10-CM | POA: Diagnosis not present

## 2017-07-01 NOTE — Telephone Encounter (Signed)
Please advise-Anastasiya V Hopkins, RMA  

## 2017-07-01 NOTE — Telephone Encounter (Signed)
Patient has an appt on 07/05/17 with  [name]. She also has a stress test scheduled for 07/06/17.   Does she need to resch her appt. W/ Jenni till after her stress test or come on Monday?

## 2017-07-01 NOTE — Telephone Encounter (Signed)
I am ok to wait and see her after the stress test

## 2017-07-01 NOTE — Telephone Encounter (Signed)
Patient rescheduled to 07/12/17. sd

## 2017-07-03 ENCOUNTER — Other Ambulatory Visit: Payer: Self-pay | Admitting: Physician Assistant

## 2017-07-03 DIAGNOSIS — I10 Essential (primary) hypertension: Secondary | ICD-10-CM

## 2017-07-05 ENCOUNTER — Ambulatory Visit: Payer: Medicare Other | Admitting: Physician Assistant

## 2017-07-06 DIAGNOSIS — R079 Chest pain, unspecified: Secondary | ICD-10-CM | POA: Diagnosis not present

## 2017-07-06 DIAGNOSIS — Z9889 Other specified postprocedural states: Secondary | ICD-10-CM | POA: Diagnosis not present

## 2017-07-06 DIAGNOSIS — R0602 Shortness of breath: Secondary | ICD-10-CM | POA: Diagnosis not present

## 2017-07-08 DIAGNOSIS — H401131 Primary open-angle glaucoma, bilateral, mild stage: Secondary | ICD-10-CM | POA: Diagnosis not present

## 2017-07-12 ENCOUNTER — Ambulatory Visit (INDEPENDENT_AMBULATORY_CARE_PROVIDER_SITE_OTHER): Payer: Medicare Other | Admitting: Physician Assistant

## 2017-07-12 ENCOUNTER — Encounter: Payer: Self-pay | Admitting: Physician Assistant

## 2017-07-12 VITALS — BP 116/80 | HR 82 | Temp 97.9°F | Resp 16 | Ht 65.0 in | Wt 159.0 lb

## 2017-07-12 DIAGNOSIS — F321 Major depressive disorder, single episode, moderate: Secondary | ICD-10-CM | POA: Diagnosis not present

## 2017-07-12 DIAGNOSIS — R632 Polyphagia: Secondary | ICD-10-CM

## 2017-07-12 DIAGNOSIS — R7301 Impaired fasting glucose: Secondary | ICD-10-CM | POA: Diagnosis not present

## 2017-07-12 DIAGNOSIS — N644 Mastodynia: Secondary | ICD-10-CM

## 2017-07-12 DIAGNOSIS — R413 Other amnesia: Secondary | ICD-10-CM | POA: Diagnosis not present

## 2017-07-12 LAB — POCT GLYCOSYLATED HEMOGLOBIN (HGB A1C)
Est. average glucose Bld gHb Est-mCnc: 108
HEMOGLOBIN A1C: 5.4

## 2017-07-12 MED ORDER — SERTRALINE HCL 100 MG PO TABS: 200.0000 mg | ORAL_TABLET | Freq: Every day | ORAL | 3 refills | Status: DC

## 2017-07-12 NOTE — Progress Notes (Signed)
Patient: Jordan Gordon Female    DOB: [birth year]   84 y.o.   MRN: [id number] Visit Date: 07/12/2017  Today's Provider: Mar Daring, PA-C   Chief Complaint  Patient presents with  . Follow-up   Subjective:    HPI  Depression/Anxiety, Follow-up  She  was last seen for this 2 months ago. Changes made at last visit include change Sertraline to Citalopram. Patient reports that she never started Citalopram. Patient is taking Sertraline 200mg  daily.   She reports fair compliance with treatment. She is not having side effects.   She reports excellent tolerance of treatment. Current symptoms include: depressed mood and fatigue She feels she is Improved since last visit.  ------------------------------------------------------------------------  Patient reports craving sweets a lot. Patient concerned of being diabetic.      Allergies  Allergen Reactions  . Codeine     nausea     Current Outpatient Medications:  .  ALPRAZolam (XANAX) 0.5 MG tablet, TAKE 1 TABLET BY MOUTH 3 TIMES DAILY AS NEEDED FOR ANXIETY, Disp: 90 tablet, Rfl: 5 .  LUMIGAN 0.01 % SOLN, Place 1 drop into both eyes at bedtime. , Disp: , Rfl:  .  potassium chloride SA (K-DUR,KLOR-CON) 20 MEQ tablet, TAKE ONE TABLET EVERY DAY, Disp: 90 tablet, Rfl: 3 .  timolol (TIMOPTIC) 0.5 % ophthalmic solution, Place 1 drop into both eyes. , Disp: , Rfl:  .  traZODone (DESYREL) 100 MG tablet, TAKE ONE TABLET BY MOUTH EVERY EVENING AT BEDTIME, Disp: 90 tablet, Rfl: 3 .  triamterene-hydrochlorothiazide (MAXZIDE-25) 37.5-25 MG tablet, TAKE ONE TABLET BY MOUTH EVERY DAY, Disp: 90 tablet, Rfl: 3 .  citalopram (CELEXA) 40 MG tablet, Take 1 tablet (40 mg total) by mouth daily. (Patient not taking: Reported on 07/12/2017), Disp: 30 tablet, Rfl: 1 .  Pseudoephedrine HCl (SINUS & ALLERGY 12 HOUR PO), Take by mouth., Disp: , Rfl:   Review of Systems  Constitutional: Negative.   HENT: Negative.   Respiratory: Negative.     Cardiovascular: Negative.   Gastrointestinal: Negative.   Endocrine: Positive for polyphagia.  Neurological: Negative.   Psychiatric/Behavioral: The patient is nervous/anxious.     Social History   Tobacco Use  . Smoking status: Never Smoker  . Smokeless tobacco: Never Used  Substance Use Topics  . Alcohol use: Yes    Alcohol/week: 1.8 oz    Types: 3 Glasses of wine per week    Comment: OCCASIONAL   Objective:   BP 116/80 (BP Location: Left Arm, Patient Position: Sitting, Cuff Size: Normal)   Pulse 82   Temp 97.9 F (36.6 C) (Oral)   Resp 16   Ht 5\' 5"  (1.651 m)   Wt 159 lb (72.1 kg)   SpO2 97%   BMI 26.46 kg/m  Vitals:   07/12/17 1106  BP: 116/80  Pulse: 82  Resp: 16  Temp: 97.9 F (36.6 C)  TempSrc: Oral  SpO2: 97%  Weight: 159 lb (72.1 kg)  Height: 5\' 5"  (1.651 m)     Physical Exam  Constitutional: She appears well-developed and well-nourished. No distress.  HENT:  Head: Normocephalic and atraumatic.  Right Ear: Hearing, tympanic membrane, external ear and ear canal normal.  Left Ear: Hearing, tympanic membrane, external ear and ear canal normal.  Nose: Nose normal.  Mouth/Throat: Uvula is midline, oropharynx is clear and moist and mucous membranes are normal. No oropharyngeal exudate.  Eyes: Conjunctivae are normal. Pupils are equal, round, and reactive to light. Right eye  exhibits no discharge. Left eye exhibits no discharge. No scleral icterus.  Neck: Normal range of motion. Neck supple. No tracheal deviation present. No thyromegaly present.  Cardiovascular: Normal rate, regular rhythm and normal heart sounds. Exam reveals no gallop and no friction rub.  No murmur heard. Pulmonary/Chest: Effort normal and breath sounds normal. No stridor. No respiratory distress. She has no wheezes. She has no rales. Right breast exhibits no inverted nipple, no mass, no nipple discharge, no skin change and no tenderness. Left breast exhibits tenderness. Left breast  exhibits no inverted nipple, no mass, no nipple discharge and no skin change. Breasts are symmetrical.    Lymphadenopathy:    She has no cervical adenopathy.  Skin: Skin is warm and dry. She is not diaphoretic.  Vitals reviewed.   MMSE - Mini Mental State Exam 07/12/2017  Orientation to time 5  Orientation to Place 5  Registration 3  Attention/ Calculation 4  Recall 3  Language- name 2 objects 2  Language- repeat 0  Language- follow 3 step command 3  Language- read & follow direction 1  Write a sentence 1  Copy design 0  Total score 27      Assessment & Plan:     1. Polyphagia A1c today in the office was 5.4. Encouraged patient that polyphagia was most likely due to winter weather and not being as active. I will see her back in 3-4 months for AWV.  - POCT HgB A1C  2. Elevated fasting glucose See above medical treatment plan. - POCT HgB A1C  3. Depression, major, single episode, moderate ([place]) Patient never started citalopram and has continued sertraline. She feels all symptoms have improved.  - sertraline (ZOLOFT) 100 MG tablet; Take 2 tablets (200 mg total) by mouth daily.  Dispense: 30 tablet; Refill: 3  4. Breast pain, left New onset left breast pain that is intermittent. Patient denies rash. No mass felt on exam today. No overlying skin changes. No mammogram since 2017. Mammogram ordered as below.  - MM DIAG BREAST TOMO BILATERAL; Future - US BREAST LTD UNI LEFT INC AXILLA; Future  5. Memory loss Normal MMSE today with score 27/30.       Mar Daring, PA-C  [facility]

## 2017-07-13 DIAGNOSIS — Z9889 Other specified postprocedural states: Secondary | ICD-10-CM | POA: Diagnosis not present

## 2017-07-13 DIAGNOSIS — R0602 Shortness of breath: Secondary | ICD-10-CM | POA: Diagnosis not present

## 2017-07-13 DIAGNOSIS — E7800 Pure hypercholesterolemia, unspecified: Secondary | ICD-10-CM | POA: Diagnosis not present

## 2017-07-13 DIAGNOSIS — R079 Chest pain, unspecified: Secondary | ICD-10-CM | POA: Diagnosis not present

## 2017-07-20 ENCOUNTER — Ambulatory Visit
Admission: RE | Admit: 2017-07-20 | Discharge: 2017-07-20 | Disposition: A | Discharge status: Home / Self Care | Payer: Medicare Other | Source: Ambulatory Visit | Attending: Physician Assistant | Admitting: Physician Assistant

## 2017-07-20 ENCOUNTER — Telehealth: Payer: Self-pay

## 2017-07-20 DIAGNOSIS — N644 Mastodynia: Secondary | ICD-10-CM | POA: Insufficient documentation

## 2017-07-20 DIAGNOSIS — R922 Inconclusive mammogram: Secondary | ICD-10-CM | POA: Diagnosis not present

## 2017-07-20 NOTE — Telephone Encounter (Signed)
Patient advised as below.  

## 2017-07-20 NOTE — Telephone Encounter (Signed)
-----   Message from Mar Daring, [place] sent at 07/20/2017 11:56 AM EST ----- Mammogram and [place] of left breast are normal.

## 2017-07-20 NOTE — Telephone Encounter (Signed)
lmtcb

## 2017-08-16 ENCOUNTER — Ambulatory Visit (INDEPENDENT_AMBULATORY_CARE_PROVIDER_SITE_OTHER): Payer: Medicare Other | Admitting: Family Medicine

## 2017-08-16 ENCOUNTER — Encounter: Payer: Self-pay | Admitting: Family Medicine

## 2017-08-16 VITALS — BP 160/84 | HR 105 | Temp 97.6°F | Resp 18

## 2017-08-16 DIAGNOSIS — R059 Cough, unspecified: Secondary | ICD-10-CM

## 2017-08-16 DIAGNOSIS — J069 Acute upper respiratory infection, unspecified: Secondary | ICD-10-CM | POA: Diagnosis not present

## 2017-08-16 DIAGNOSIS — R05 Cough: Secondary | ICD-10-CM | POA: Diagnosis not present

## 2017-08-16 MED ORDER — AZITHROMYCIN 250 MG PO TABS
ORAL_TABLET | ORAL | 0 refills | Status: AC
Start: 2017-08-16 — End: 2017-08-21

## 2017-08-16 MED ORDER — HYDROCODONE-HOMATROPINE 5-1.5 MG/5ML PO SYRP: 5.0000 mL | ORAL_SOLUTION | Freq: Three times a day (TID) | ORAL | 0 refills | Status: DC | PRN

## 2017-08-16 NOTE — Addendum Note (Signed)
Addended by: Birdie Sons on: 08/16/2017 04:06 PM   Modules accepted: Level of Service

## 2017-08-16 NOTE — Progress Notes (Signed)
Patient: Jordan Gordon Female    DOB: [birth year]   83 y.o.   MRN: [id number] Visit Date: 08/16/2017  Today's Provider: Lelon Huh, MD   Chief Complaint  Patient presents with  . Cough    x 3-4 days   Subjective:    Cough  This is a new problem. Episode onset: about 2 weeks ago. The problem has been gradually worsening. The cough is productive of sputum. Associated symptoms include chest pain (started yesterday), postnasal drip, rhinorrhea and shortness of breath. Pertinent negatives include no chills, fever, headaches, myalgias, nasal congestion, sore throat or wheezing. She has tried OTC cough suppressant for the symptoms. The treatment provided mild relief.  Cough is worse at night. Patient reports she called EMS out to her home this morning due to having chest pain. She states they did an EKG on her an determined that her chest pain wasn't cardiac related. She states the pain is usually after eating and sometimes when she leans over. It just lasts a couple of seconds. She has had extensive cardiac by workup including Myoview in January 2019.  No sore throat, but having a lot of nasal drainage and congestion.       Allergies  Allergen Reactions  . Codeine     nausea     Current Outpatient Medications:  .  ALPRAZolam (XANAX) 0.5 MG tablet, TAKE 1 TABLET BY MOUTH 3 TIMES DAILY AS NEEDED FOR ANXIETY, Disp: 90 tablet, Rfl: 5 .  LUMIGAN 0.01 % SOLN, Place 1 drop into both eyes at bedtime. , Disp: , Rfl:  .  potassium chloride SA (K-DUR,KLOR-CON) 20 MEQ tablet, TAKE ONE TABLET EVERY DAY, Disp: 90 tablet, Rfl: 3 .  Pseudoephedrine HCl (SINUS & ALLERGY 12 HOUR PO), Take by mouth., Disp: , Rfl:  .  sertraline (ZOLOFT) 100 MG tablet, Take 2 tablets (200 mg total) by mouth daily., Disp: 30 tablet, Rfl: 3 .  timolol (TIMOPTIC) 0.5 % ophthalmic solution, Place 1 drop into both eyes. , Disp: , Rfl:  .  traZODone (DESYREL) 100 MG tablet, TAKE ONE TABLET BY MOUTH EVERY EVENING AT  BEDTIME, Disp: 90 tablet, Rfl: 3 .  triamterene-hydrochlorothiazide (MAXZIDE-25) 37.5-25 MG tablet, TAKE ONE TABLET BY MOUTH EVERY DAY, Disp: 90 tablet, Rfl: 3  Review of Systems  Constitutional: Negative for appetite change, chills, fatigue and fever.  HENT: Positive for postnasal drip and rhinorrhea. Negative for sore throat.   Respiratory: Positive for cough and shortness of breath. Negative for chest tightness and wheezing.   Cardiovascular: Positive for chest pain (started yesterday). Negative for palpitations.  Gastrointestinal: Negative for abdominal pain, nausea and vomiting.  Musculoskeletal: Negative for myalgias.  Neurological: Negative for dizziness, weakness and headaches.    Social History   Tobacco Use  . Smoking status: Never Smoker  . Smokeless tobacco: Never Used  Substance Use Topics  . Alcohol use: Yes    Alcohol/week: 1.8 oz    Types: 3 Glasses of wine per week    Comment: OCCASIONAL   Objective:   BP (!) 160/84 (BP Location: Right Arm, Cuff Size: Normal)   Pulse (!) 105   Temp 97.6 F (36.4 C) (Oral)   Resp 18   SpO2 99% Comment: room air Vitals:   08/16/17 1528 08/16/17 1529  BP: (!) 170/92 (!) 160/84  Pulse: (!) 105   Resp: 18   Temp: 97.6 F (36.4 C)   TempSrc: Oral   SpO2: 99%  Physical Exam  General Appearance:    Alert, cooperative, no distress  HENT:   bilateral TM normal without fluid or infection, neck without nodes, sinuses nontender, post nasal drip noted and nasal mucosa congested  Eyes:    PERRL, conjunctiva/corneas clear, EOM's intact       Lungs:     Clear to auscultation bilaterally, respirations unlabored  Heart:    Regular rate and rhythm  Neurologic:   Awake, alert, oriented x 3. No apparent focal neurological           defect.           Assessment & Plan:     1. Cough  - HYDROcodone-homatropine (HYCODAN) 5-1.5 MG/5ML syrup; Take 5 mLs by mouth every 8 (eight) hours as needed for cough.  Dispense: 100 mL; Refill:  0  Likely pleuritic chest pain, possible GERD, as she has recently had extensive cardiac workup.  2. Upper respiratory tract infection, unspecified type  - azithromycin (ZITHROMAX) 250 MG tablet; 2 by mouth today, then 1 daily for 4 days  Dispense: 6 tablet; Refill: 0  Call if symptoms change or if not rapidly improving.          Lelon Huh, MD  [facility]

## 2017-08-20 ENCOUNTER — Telehealth: Payer: Self-pay | Admitting: Physician Assistant

## 2017-08-20 NOTE — Telephone Encounter (Signed)
Pt states she saw Dr. Caryn Section last week and he gave her something for a chest strain.  States her breast is still hurting and she wants to get a call back or a referral.

## 2017-08-23 NOTE — Telephone Encounter (Signed)
Please review. Thanks!  

## 2017-08-24 ENCOUNTER — Ambulatory Visit (INDEPENDENT_AMBULATORY_CARE_PROVIDER_SITE_OTHER): Payer: Medicare Other | Admitting: Physician Assistant

## 2017-08-24 ENCOUNTER — Encounter: Payer: Self-pay | Admitting: Physician Assistant

## 2017-08-24 VITALS — BP 126/80 | HR 88 | Temp 98.7°F | Resp 16 | Wt 163.0 lb

## 2017-08-24 DIAGNOSIS — N644 Mastodynia: Secondary | ICD-10-CM

## 2017-08-24 NOTE — Telephone Encounter (Signed)
Patient has appt

## 2017-08-24 NOTE — Progress Notes (Signed)
       Patient: Jordan Gordon Female    DOB: 20-May-1934   84 y.o.   MRN: [id number] Visit Date: 08/24/2017  Today's Provider: Mar Daring, PA-C   Chief Complaint  Patient presents with  . Follow-up   Subjective:    HPI Patient here today to follow up on left breast pain. Patient was seen on 08/16/2017 by Dr. Caryn Section and was started on azithromycin and hycodan. Patient reports good compliance and tolerance with medication. Patient reports left breast pain is much better. Patient reports she is not sure if pain was being caused by cough or something else. Patient wants to discuss her left breast u/s results. Patient is concerned that mammogram Wenker not be completely accurate due to heterogeneously dense.     Allergies  Allergen Reactions  . Codeine     nausea     Current Outpatient Medications:  .  ALPRAZolam (XANAX) 0.5 MG tablet, TAKE 1 TABLET BY MOUTH 3 TIMES DAILY AS NEEDED FOR ANXIETY, Disp: 90 tablet, Rfl: 5 .  LUMIGAN 0.01 % SOLN, Place 1 drop into both eyes at bedtime. , Disp: , Rfl:  .  potassium chloride SA (K-DUR,KLOR-CON) 20 MEQ tablet, TAKE ONE TABLET EVERY DAY, Disp: 90 tablet, Rfl: 3 .  Pseudoephedrine HCl (SINUS & ALLERGY 12 HOUR PO), Take by mouth., Disp: , Rfl:  .  sertraline (ZOLOFT) 100 MG tablet, Take 2 tablets (200 mg total) by mouth daily., Disp: 30 tablet, Rfl: 3 .  timolol (TIMOPTIC) 0.5 % ophthalmic solution, Place 1 drop into both eyes. , Disp: , Rfl:  .  traZODone (DESYREL) 100 MG tablet, TAKE ONE TABLET BY MOUTH EVERY EVENING AT BEDTIME, Disp: 90 tablet, Rfl: 3 .  triamterene-hydrochlorothiazide (MAXZIDE-25) 37.5-25 MG tablet, TAKE ONE TABLET BY MOUTH EVERY DAY, Disp: 90 tablet, Rfl: 3  Review of Systems  Constitutional: Negative.   HENT: Negative.   Respiratory: Negative.   Cardiovascular: Negative.   Neurological: Negative.     Social History   Tobacco Use  . Smoking status: Never Smoker  . Smokeless tobacco: Never Used  Substance Use  Topics  . Alcohol use: Yes    Alcohol/week: 1.8 oz    Types: 3 Glasses of wine per week    Comment: OCCASIONAL   Objective:   BP 126/80 (BP Location: Left Arm, Patient Position: Sitting, Cuff Size: Normal)   Pulse 88   Temp 98.7 F (37.1 C) (Oral)   Resp 16   Wt 163 lb (73.9 kg)   BMI 27.12 kg/m  Vitals:   08/24/17 0802  BP: 126/80  Pulse: 88  Resp: 16  Temp: 98.7 F (37.1 C)  TempSrc: Oral  Weight: 163 lb (73.9 kg)     Physical Exam  Constitutional: She appears well-developed and well-nourished. No distress.  Neck: Normal range of motion. Neck supple.  Cardiovascular: Normal rate, regular rhythm and normal heart sounds. Exam reveals no gallop and no friction rub.  No murmur heard. Pulmonary/Chest: Effort normal and breath sounds normal. No respiratory distress. She has no wheezes. She has no rales.  Skin: She is not diaphoretic.  Vitals reviewed.      Assessment & Plan:     1. Breast pain Improved. Discussed mammogram results. Advised to continue self breast exams and let us know if any complaints.        Mar Daring, PA-C  [facility]

## 2017-08-27 ENCOUNTER — Other Ambulatory Visit: Payer: Self-pay | Admitting: Physician Assistant

## 2017-08-27 DIAGNOSIS — F32A Depression, unspecified: Secondary | ICD-10-CM

## 2017-08-27 DIAGNOSIS — F419 Anxiety disorder, unspecified: Secondary | ICD-10-CM

## 2017-08-27 DIAGNOSIS — F329 Major depressive disorder, single episode, unspecified: Secondary | ICD-10-CM

## 2017-09-16 ENCOUNTER — Other Ambulatory Visit: Payer: Self-pay | Admitting: Physician Assistant

## 2017-09-16 DIAGNOSIS — F419 Anxiety disorder, unspecified: Secondary | ICD-10-CM

## 2017-09-21 DIAGNOSIS — H353212 Exudative age-related macular degeneration, right eye, with inactive choroidal neovascularization: Secondary | ICD-10-CM | POA: Diagnosis not present

## 2017-10-01 ENCOUNTER — Other Ambulatory Visit: Payer: Self-pay | Admitting: Physician Assistant

## 2017-10-01 DIAGNOSIS — E876 Hypokalemia: Secondary | ICD-10-CM

## 2017-10-04 DIAGNOSIS — G8929 Other chronic pain: Secondary | ICD-10-CM | POA: Diagnosis not present

## 2017-10-04 DIAGNOSIS — M545 Low back pain: Secondary | ICD-10-CM | POA: Diagnosis not present

## 2017-10-05 NOTE — Progress Notes (Signed)
Patient: Jordan Gordon Female    DOB: [birth year]   83 y.o.   MRN: [id number] Visit Date: 10/06/2017  Today's Provider: Mar Daring, PA-C   Chief Complaint  Patient presents with  . Depression  . Anxiety  . Insomnia   Subjective:    HPI  Depression/Anxiety, Follow-up  She  was last seen for this 3 months ago. Changes made at last visit include no changes.   She reports excellent compliance with treatment. She is not having side effects.   She reports fair tolerance of treatment. Current symptoms include: depressed mood She feels she is Worse since last visit. Patient reports taking Xanax 1 tablet 3 times daily. ------------------------------------------------------------------------     Allergies  Allergen Reactions  . Codeine     nausea     Current Outpatient Medications:  .  ALPRAZolam (XANAX) 0.5 MG tablet, TAKE 1 TABLET BY MOUTH 3 TIMES DAILY AS NEEDED FOR ANXIETY, Disp: 90 tablet, Rfl: 5 .  LUMIGAN 0.01 % SOLN, Place 1 drop into both eyes at bedtime. , Disp: , Rfl:  .  potassium chloride SA (K-DUR,KLOR-CON) 20 MEQ tablet, TAKE ONE TABLET BY MOUTH EVERY DAY, Disp: 90 tablet, Rfl: 3 .  sertraline (ZOLOFT) 100 MG tablet, TAKE TWO TABLETS EVERY DAY, Disp: 180 tablet, Rfl: 3 .  traZODone (DESYREL) 100 MG tablet, TAKE ONE TABLET BY MOUTH EVERY EVENING AT BEDTIME, Disp: 90 tablet, Rfl: 3 .  triamterene-hydrochlorothiazide (MAXZIDE-25) 37.5-25 MG tablet, TAKE ONE TABLET BY MOUTH EVERY DAY, Disp: 90 tablet, Rfl: 3 .  Pseudoephedrine HCl (SINUS & ALLERGY 12 HOUR PO), Take by mouth., Disp: , Rfl:   Review of Systems  Constitutional: Negative.   Respiratory: Negative.   Cardiovascular: Negative.   Gastrointestinal: Negative.   Psychiatric/Behavioral: Positive for dysphoric mood. The patient is nervous/anxious.     Social History   Tobacco Use  . Smoking status: Never Smoker  . Smokeless tobacco: Never Used  Substance Use Topics  . Alcohol use: Yes    Alcohol/week: 1.8 oz    Types: 3 Glasses of wine per week    Comment: OCCASIONAL   Objective:   BP 130/86 (BP Location: Left Arm, Patient Position: Sitting, Cuff Size: Large)   Pulse 84   Temp 98 F (36.7 C) (Oral)   Resp 16   Wt 165 lb 12.8 oz (75.2 kg)   SpO2 96%   BMI 27.59 kg/m  Vitals:   10/06/17 1432  BP: 130/86  Pulse: 84  Resp: 16  Temp: 98 F (36.7 C)  TempSrc: Oral  SpO2: 96%  Weight: 165 lb 12.8 oz (75.2 kg)   Depression screen [facility] 2/9 10/06/2017 08/24/2017 05/05/2017 07/09/2016 07/09/2015  Decreased Interest 3 0 1 0 0  Down, Depressed, Hopeless 3 0 1 1 0  PHQ - 2 Score 6 0 2 1 0  Altered sleeping 0 0 1 - -  Tired, decreased energy 2 1 1  - -  Change in appetite 0 0 0 - -  Feeling bad or failure about yourself  1 0 0 - -  Trouble concentrating 2 0 0 - -  Moving slowly or fidgety/restless 1 0 0 - -  Suicidal thoughts 0 0 0 - -  PHQ-9 Score 12 1 4  - -  Difficult doing work/chores Somewhat difficult - Not difficult at all - -     Physical Exam  Constitutional: She appears well-developed and well-nourished. No distress.  HENT:  Head: Normocephalic and atraumatic.  Right Ear: Hearing, tympanic membrane, external ear and ear canal normal.  Left Ear: Hearing, tympanic membrane, external ear and ear canal normal.  Nose: Nose normal.  Mouth/Throat: Uvula is midline, oropharynx is clear and moist and mucous membranes are normal. No oropharyngeal exudate.  Eyes: Pupils are equal, round, and reactive to light. Conjunctivae are normal. Right eye exhibits no discharge. Left eye exhibits no discharge. No scleral icterus.  Neck: Normal range of motion. Neck supple. No tracheal deviation present. No thyromegaly present.  Cardiovascular: Normal rate, regular rhythm and normal heart sounds. Exam reveals no gallop and no friction rub.  No murmur heard. Pulmonary/Chest: Effort normal and breath sounds normal. No stridor. No respiratory distress. She has no wheezes. She has no  rales.  Lymphadenopathy:    She has no cervical adenopathy.  Skin: Skin is warm and dry. She is not diaphoretic.  Vitals reviewed.      Assessment & Plan:     1. Recurrent major depressive disorder, in partial remission (HCC) Will change to citalopram as below. Stop Sertraline 200mg . I will see her back in 4 weeks to see how she is doing with the change.  - citalopram (CELEXA) 40 MG tablet; Take 1 tablet (40 mg total) by mouth daily.  Dispense: 30 tablet; Refill: 1  2. Anxiety See above medical treatment plan. - citalopram (CELEXA) 40 MG tablet; Take 1 tablet (40 mg total) by mouth daily.  Dispense: 30 tablet; Refill: [place], PA-C  [facility]

## 2017-10-06 ENCOUNTER — Encounter: Payer: Self-pay | Admitting: Physician Assistant

## 2017-10-06 ENCOUNTER — Ambulatory Visit (INDEPENDENT_AMBULATORY_CARE_PROVIDER_SITE_OTHER): Payer: Medicare Other | Admitting: Physician Assistant

## 2017-10-06 VITALS — BP 130/86 | HR 84 | Temp 98.0°F | Resp 16 | Wt 165.8 lb

## 2017-10-06 DIAGNOSIS — F419 Anxiety disorder, unspecified: Secondary | ICD-10-CM | POA: Diagnosis not present

## 2017-10-06 DIAGNOSIS — F3341 Major depressive disorder, recurrent, in partial remission: Secondary | ICD-10-CM

## 2017-10-06 MED ORDER — CITALOPRAM HYDROBROMIDE 40 MG PO TABS: 40.0000 mg | ORAL_TABLET | Freq: Every day | ORAL | 1 refills | Status: DC

## 2017-10-06 NOTE — Patient Instructions (Signed)
Citalopram tablets What is this medicine? CITALOPRAM (sye TAL oh pram) is a medicine for depression. This medicine Cashaw be used for other purposes; ask your health care provider or pharmacist if you have questions. COMMON BRAND NAME(S): Celexa What should I tell my health care provider before I take this medicine? They need to know if you have any of these conditions: -bleeding disorders -bipolar disorder or a family history of bipolar disorder -glaucoma -heart disease -history of irregular heartbeat -kidney disease -liver disease -low levels of magnesium or potassium in the blood -receiving electroconvulsive therapy -seizures -suicidal thoughts, plans, or attempt; a previous suicide attempt by you or a family member -take medicines that treat or prevent blood clots -thyroid disease -an unusual or allergic reaction to citalopram, escitalopram, other medicines, foods, dyes, or preservatives -pregnant or trying to become pregnant -breast-feeding How should I use this medicine? Take this medicine by mouth with a glass of water. Follow the directions on the prescription label. You can take it with or without food. Take your medicine at regular intervals. Do not take your medicine more often than directed. Do not stop taking this medicine suddenly except upon the advice of your doctor. Stopping this medicine too quickly Stanhope cause serious side effects or your condition Brossart worsen. A special MedGuide will be given to you by the pharmacist with each prescription and refill. Be sure to read this information carefully each time. Talk to your pediatrician regarding the use of this medicine in children. Special care Domek be needed. Patients over 60 years old Manna have a stronger reaction and need a smaller dose. Overdosage: If you think you have taken too much of this medicine contact a poison control center or emergency room at once. NOTE: This medicine is only for you. Do not share this medicine with  others. What if I miss a dose? If you miss a dose, take it as soon as you can. If it is almost time for your next dose, take only that dose. Do not take double or extra doses. What Slatten interact with this medicine? Do not take this medicine with any of the following medications: -certain medicines for fungal infections like fluconazole, itraconazole, ketoconazole, posaconazole, voriconazole -cisapride -dofetilide -dronedarone -escitalopram -linezolid -MAOIs like Carbex, Eldepryl, Marplan, Nardil, and Parnate -methylene blue (injected into a vein) -pimozide -thioridazine -ziprasidone This medicine Boley also interact with the following medications: -alcohol -amphetamines -aspirin and aspirin-like medicines -carbamazepine -certain medicines for depression, anxiety, or psychotic disturbances -certain medicines for infections like chloroquine, clarithromycin, erythromycin, furazolidone, isoniazid, pentamidine -certain medicines for migraine headaches like almotriptan, eletriptan, frovatriptan, naratriptan, rizatriptan, sumatriptan, zolmitriptan -certain medicines for sleep -certain medicines that treat or prevent blood clots like dalteparin, enoxaparin, warfarin -cimetidine -diuretics -fentanyl -lithium -methadone -metoprolol -NSAIDs, medicines for pain and inflammation, like ibuprofen or naproxen -omeprazole -other medicines that prolong the QT interval (cause an abnormal heart rhythm) -procarbazine -rasagiline -supplements like St. [name]'s wort, kava kava, valerian -tramadol -tryptophan This list Caylor not describe all possible interactions. Give your health care provider a list of all the medicines, herbs, non-prescription drugs, or dietary supplements you use. Also tell them if you smoke, drink alcohol, or use illegal drugs. Some items Moncur interact with your medicine. What should I watch for while using this medicine? Tell your doctor if your symptoms do not get better or if they  get worse. Visit your doctor or health care professional for regular checks on your progress. Because it Kovacich take several weeks to see the full   effects of this medicine, it is important to continue your treatment as prescribed by your doctor. Patients and their families should watch out for new or worsening thoughts of suicide or depression. Also watch out for sudden changes in feelings such as feeling anxious, agitated, panicky, irritable, hostile, aggressive, impulsive, severely restless, overly excited and hyperactive, or not being able to sleep. If this happens, especially at the beginning of treatment or after a change in dose, call your health care professional. You Kalman get drowsy or dizzy. Do not drive, use machinery, or do anything that needs mental alertness until you know how this medicine affects you. Do not stand or sit up quickly, especially if you are an older patient. This reduces the risk of dizzy or fainting spells. Alcohol Folker interfere with the effect of this medicine. Avoid alcoholic drinks. Your mouth Ratto get dry. Chewing sugarless gum or sucking hard candy, and drinking plenty of water will help. Contact your doctor if the problem does not go away or is severe. What side effects Scafidi I notice from receiving this medicine? Side effects that you should report to your doctor or health care professional as soon as possible: -allergic reactions like skin rash, itching or hives, swelling of the face, lips, or tongue -anxious -black, tarry stools -breathing problems -changes in vision -chest pain -confusion -elevated mood, decreased need for sleep, racing thoughts, impulsive behavior -eye pain -fast, irregular heartbeat -feeling faint or lightheaded, falls -feeling agitated, angry, or irritable -hallucination, loss of contact with reality -loss of balance or coordination -loss of memory -painful or prolonged erections -restlessness, pacing, inability to keep  still -seizures -stiff muscles -suicidal thoughts or other mood changes -trouble sleeping -unusual bleeding or bruising -unusually weak or tired -vomiting Side effects that usually do not require medical attention (report to your doctor or health care professional if they continue or are bothersome): -change in appetite or weight -change in sex drive or performance -dizziness -headache -increased sweating -indigestion, nausea -tremors This list Soth not describe all possible side effects. Call your doctor for medical advice about side effects. You Jelinek report side effects to FDA at 1-800-FDA-1088. Where should I keep my medicine? Keep out of reach of children. Store at room temperature between 15 and 30 degrees C (59 and 86 degrees F). Throw away any unused medicine after the expiration date. NOTE: This sheet is a summary. It Lombardozzi not cover all possible information. If you have questions about this medicine, talk to your doctor, pharmacist, or health care provider.  2018 Elsevier/Gold Standard (2015-10-28 13:18:52)  

## 2017-10-07 ENCOUNTER — Other Ambulatory Visit: Payer: Self-pay | Admitting: Student

## 2017-10-07 DIAGNOSIS — M545 Low back pain: Secondary | ICD-10-CM

## 2017-10-13 ENCOUNTER — Ambulatory Visit: Payer: Medicare Other

## 2017-11-03 ENCOUNTER — Encounter: Payer: Self-pay | Admitting: Physician Assistant

## 2017-11-03 ENCOUNTER — Ambulatory Visit (INDEPENDENT_AMBULATORY_CARE_PROVIDER_SITE_OTHER): Payer: Medicare Other | Admitting: Physician Assistant

## 2017-11-03 VITALS — BP 142/88 | HR 97 | Temp 97.9°F | Wt 165.2 lb

## 2017-11-03 DIAGNOSIS — F3341 Major depressive disorder, recurrent, in partial remission: Secondary | ICD-10-CM

## 2017-11-03 DIAGNOSIS — F419 Anxiety disorder, unspecified: Secondary | ICD-10-CM | POA: Diagnosis not present

## 2017-11-03 DIAGNOSIS — H401131 Primary open-angle glaucoma, bilateral, mild stage: Secondary | ICD-10-CM | POA: Diagnosis not present

## 2017-11-03 MED ORDER — SERTRALINE HCL 100 MG PO TABS
200.0000 mg | ORAL_TABLET | Freq: Every day | ORAL | 3 refills | Status: DC
Start: 2017-11-03 — End: 2017-11-03

## 2017-11-03 MED ORDER — BUPROPION HCL ER (XL) 150 MG PO TB24: 150.0000 mg | ORAL_TABLET | Freq: Every day | ORAL | 1 refills | Status: DC

## 2017-11-03 MED ORDER — SERTRALINE HCL 100 MG PO TABS: 100.0000 mg | ORAL_TABLET | Freq: Every day | ORAL | 1 refills | Status: DC

## 2017-11-03 NOTE — Progress Notes (Signed)
Patient: Jordan Gordon Female    DOB: 1933-08-05   84 y.o.   MRN: [id number] Visit Date: 11/03/2017  Today's Provider: Mar Daring, PA-C   Chief Complaint  Patient presents with  . Depression  . Anxiety   Subjective:    HPI             Recurrent major depression disorder & Anxiety:   She  was last seen for this 1 month ago. Changes made at last visit include advised to stop Sertraline 200 mg and start Citalopram 40 mg tablet.              She reports good compliance with treatment. She is not having side effects.   She reports good tolerance of treatment, but feels the dose needs to be increased. She states she is having to take Sertraline 100 mg along with the Citalopram 40 mg.    Symptoms are improved with both medications.    Allergies  Allergen Reactions  . Codeine     nausea     Current Outpatient Medications:  .  ALPRAZolam (XANAX) 0.5 MG tablet, TAKE 1 TABLET BY MOUTH 3 TIMES DAILY AS NEEDED FOR ANXIETY, Disp: 90 tablet, Rfl: 5 .  citalopram (CELEXA) 40 MG tablet, Take 1 tablet (40 mg total) by mouth daily., Disp: 30 tablet, Rfl: 1 .  LUMIGAN 0.01 % SOLN, Place 1 drop into both eyes at bedtime. , Disp: , Rfl:  .  potassium chloride SA (K-DUR,KLOR-CON) 20 MEQ tablet, TAKE ONE TABLET BY MOUTH EVERY DAY, Disp: 90 tablet, Rfl: 3 .  Pseudoephedrine HCl (SINUS & ALLERGY 12 HOUR PO), Take by mouth., Disp: , Rfl:  .  traZODone (DESYREL) 100 MG tablet, TAKE ONE TABLET BY MOUTH EVERY EVENING AT BEDTIME, Disp: 90 tablet, Rfl: 3 .  triamterene-hydrochlorothiazide (MAXZIDE-25) 37.5-25 MG tablet, TAKE ONE TABLET BY MOUTH EVERY DAY, Disp: 90 tablet, Rfl: 3   Review of Systems  Constitutional: Negative.   Respiratory: Negative.   Cardiovascular: Negative.   Neurological: Negative.   Psychiatric/Behavioral: Positive for dysphoric mood. The patient is nervous/anxious.     Social History   Tobacco Use  . Smoking status: Never Smoker  . Smokeless tobacco:  Never Used  Substance Use Topics  . Alcohol use: Yes    Alcohol/week: 1.8 oz    Types: 3 Glasses of wine per week    Comment: OCCASIONAL   Objective:   BP (!) 142/88 (BP Location: Left Arm, Patient Position: Sitting, Cuff Size: Normal)   Pulse 97   Temp 97.9 F (36.6 C) (Oral)   Wt 165 lb 3.2 oz (74.9 kg)   SpO2 99%   BMI 27.49 kg/m    Physical Exam  Constitutional: She appears well-developed and well-nourished. No distress.  Neck: Normal range of motion. Neck supple.  Cardiovascular: Normal rate, regular rhythm and normal heart sounds. Exam reveals no gallop and no friction rub.  No murmur heard. Pulmonary/Chest: Effort normal and breath sounds normal. No respiratory distress. She has no wheezes. She has no rales.  Skin: She is not diaphoretic.  Psychiatric: She has a normal mood and affect. Her behavior is normal. Judgment and thought content normal.  Vitals reviewed.     Assessment & Plan:     1. Anxiety Will continue sertraline at 100mg  nightly. Stop citalopram 40mg  due to ineffectiveness. Will start wellbutrin 150mg  XL as below. I will see her back in 4-6 weeks.  - sertraline (ZOLOFT) 100 MG  tablet; Take 1 tablet (100 mg total) by mouth at bedtime.  Dispense: 90 tablet; Refill: 1 - buPROPion (WELLBUTRIN XL) 150 MG 24 hr tablet; Take 1 tablet (150 mg total) by mouth daily.  Dispense: 9 tablet; Refill: 1  2. Recurrent major depressive disorder, in partial remission ([place]) See above medical treatment plan. - sertraline (ZOLOFT) 100 MG tablet; Take 1 tablet (100 mg total) by mouth at bedtime.  Dispense: 90 tablet; Refill: 1 - buPROPion (WELLBUTRIN XL) 150 MG 24 hr tablet; Take 1 tablet (150 mg total) by mouth daily.  Dispense: 9 tablet; Refill: Judson, PA-C  [facility]

## 2017-11-03 NOTE — Patient Instructions (Signed)
Bupropion extended-release tablets (Depression/Mood Disorders) What is this medicine? BUPROPION (byoo PROE pee on) is used to treat depression. This medicine Persinger be used for other purposes; ask your health care provider or pharmacist if you have questions. COMMON BRAND NAME(S): Aplenzin, Budeprion XL, Forfivo XL, Wellbutrin XL What should I tell my health care provider before I take this medicine? They need to know if you have any of these conditions: -an eating disorder, such as anorexia or bulimia -bipolar disorder or psychosis -diabetes or high blood sugar, treated with medication -glaucoma -head injury or brain tumor -heart disease, previous heart attack, or irregular heart beat -high blood pressure -kidney or liver disease -seizures (convulsions) -suicidal thoughts or a previous suicide attempt -Tourette's syndrome -weight loss -an unusual or allergic reaction to bupropion, other medicines, foods, dyes, or preservatives -breast-feeding -pregnant or trying to become pregnant How should I use this medicine? Take this medicine by mouth with a glass of water. Follow the directions on the prescription label. You can take it with or without food. If it upsets your stomach, take it with food. Do not crush, chew, or cut these tablets. This medicine is taken once daily at the same time each day. Do not take your medicine more often than directed. Do not stop taking this medicine suddenly except upon the advice of your doctor. Stopping this medicine too quickly Beeck cause serious side effects or your condition Oka worsen. A special MedGuide will be given to you by the pharmacist with each prescription and refill. Be sure to read this information carefully each time. Talk to your pediatrician regarding the use of this medicine in children. Special care Kirst be needed. Overdosage: If you think you have taken too much of this medicine contact a poison control center or emergency room at once. NOTE:  This medicine is only for you. Do not share this medicine with others. What if I miss a dose? If you miss a dose, skip the missed dose and take your next tablet at the regular time. Do not take double or extra doses. What Swofford interact with this medicine? Do not take this medicine with any of the following medications: -linezolid -MAOIs like Azilect, Carbex, Eldepryl, Marplan, Nardil, and Parnate -methylene blue (injected into a vein) -other medicines that contain bupropion like Zyban This medicine Griebel also interact with the following medications: -alcohol -certain medicines for anxiety or sleep -certain medicines for blood pressure like metoprolol, propranolol -certain medicines for depression or psychotic disturbances -certain medicines for HIV or AIDS like efavirenz, lopinavir, nelfinavir, ritonavir -certain medicines for irregular heart beat like propafenone, flecainide -certain medicines for Parkinson's disease like amantadine, levodopa -certain medicines for seizures like carbamazepine, phenytoin, phenobarbital -cimetidine -clopidogrel -cyclophosphamide -digoxin -furazolidone -isoniazid -nicotine -orphenadrine -procarbazine -steroid medicines like prednisone or cortisone -stimulant medicines for attention disorders, weight loss, or to stay awake -tamoxifen -theophylline -thiotepa -ticlopidine -tramadol -warfarin This list Soots not describe all possible interactions. Give your health care provider a list of all the medicines, herbs, non-prescription drugs, or dietary supplements you use. Also tell them if you smoke, drink alcohol, or use illegal drugs. Some items Faas interact with your medicine. What should I watch for while using this medicine? Tell your doctor if your symptoms do not get better or if they get worse. Visit your doctor or health care professional for regular checks on your progress. Because it Escalera take several weeks to see the full effects of this medicine, it  is important to continue your treatment as   prescribed by your doctor. Patients and their families should watch out for new or worsening thoughts of suicide or depression. Also watch out for sudden changes in feelings such as feeling anxious, agitated, panicky, irritable, hostile, aggressive, impulsive, severely restless, overly excited and hyperactive, or not being able to sleep. If this happens, especially at the beginning of treatment or after a change in dose, call your health care professional. Avoid alcoholic drinks while taking this medicine. Drinking large amounts of alcoholic beverages, using sleeping or anxiety medicines, or quickly stopping the use of these agents while taking this medicine Mckiddy increase your risk for a seizure. Do not drive or use heavy machinery until you know how this medicine affects you. This medicine can impair your ability to perform these tasks. Do not take this medicine close to bedtime. It Coca prevent you from sleeping. Your mouth Villari get dry. Chewing sugarless gum or sucking hard candy, and drinking plenty of water Stark help. Contact your doctor if the problem does not go away or is severe. The tablet shell for some brands of this medicine does not dissolve. This is normal. The tablet shell Oesterle appear whole in the stool. This is not a cause for concern. What side effects Deshaies I notice from receiving this medicine? Side effects that you should report to your doctor or health care professional as soon as possible: -allergic reactions like skin rash, itching or hives, swelling of the face, lips, or tongue -breathing problems -changes in vision -confusion -elevated mood, decreased need for sleep, racing thoughts, impulsive behavior -fast or irregular heartbeat -hallucinations, loss of contact with reality -increased blood pressure -redness, blistering, peeling or loosening of the skin, including inside the mouth -seizures -suicidal thoughts or other mood  changes -unusually weak or tired -vomiting Side effects that usually do not require medical attention (report to your doctor or health care professional if they continue or are bothersome): -constipation -headache -loss of appetite -nausea -tremors -weight loss This list Bandel not describe all possible side effects. Call your doctor for medical advice about side effects. You Hanak report side effects to FDA at 1-800-FDA-1088. Where should I keep my medicine? Keep out of the reach of children. Store at room temperature between 15 and 30 degrees C (59 and 86 degrees F). Throw away any unused medicine after the expiration date. NOTE: This sheet is a summary. It Quirarte not cover all possible information. If you have questions about this medicine, talk to your doctor, pharmacist, or health care provider.  2018 Elsevier/Gold Standard (2015-11-15 13:55:13)  

## 2017-11-10 DIAGNOSIS — H401131 Primary open-angle glaucoma, bilateral, mild stage: Secondary | ICD-10-CM | POA: Diagnosis not present

## 2017-11-10 DIAGNOSIS — H353122 Nonexudative age-related macular degeneration, left eye, intermediate dry stage: Secondary | ICD-10-CM | POA: Diagnosis not present

## 2017-11-16 DIAGNOSIS — H401131 Primary open-angle glaucoma, bilateral, mild stage: Secondary | ICD-10-CM | POA: Diagnosis not present

## 2017-11-18 ENCOUNTER — Ambulatory Visit: Payer: Medicare Other | Admitting: Student in an Organized Health Care Education/Training Program

## 2017-11-22 ENCOUNTER — Encounter: Payer: Self-pay | Admitting: Physician Assistant

## 2017-11-22 ENCOUNTER — Ambulatory Visit (INDEPENDENT_AMBULATORY_CARE_PROVIDER_SITE_OTHER): Payer: Medicare Other | Admitting: Physician Assistant

## 2017-11-22 VITALS — BP 130/80 | HR 62 | Temp 98.2°F | Resp 16 | Wt 163.6 lb

## 2017-11-22 DIAGNOSIS — H539 Unspecified visual disturbance: Secondary | ICD-10-CM | POA: Diagnosis not present

## 2017-11-22 DIAGNOSIS — F321 Major depressive disorder, single episode, moderate: Secondary | ICD-10-CM | POA: Diagnosis not present

## 2017-11-22 DIAGNOSIS — Z8639 Personal history of other endocrine, nutritional and metabolic disease: Secondary | ICD-10-CM

## 2017-11-22 DIAGNOSIS — F411 Generalized anxiety disorder: Secondary | ICD-10-CM

## 2017-11-22 LAB — POCT GLYCOSYLATED HEMOGLOBIN (HGB A1C)
Est. average glucose Bld gHb Est-mCnc: 114
Hemoglobin A1C: 5.6 % (ref 4.0–5.6)

## 2017-11-22 NOTE — Progress Notes (Signed)
Patient: Jordan Gordon Female    DOB: 28-May-1934   84 y.o.   MRN: [id number] Visit Date: 11/22/2017  Today's Provider: Mar Daring, PA-C   No chief complaint on file.  Subjective:    HPI Patient here today with c/o visual disturbances and wants her sugar to be checked. She reports she had her eyes checked last week. She also states she has been under a lot of stress. A1C today is 5.6.   Lab Results  Component Value Date   HGBA1C 5.6 11/22/2017   HGBA1C 5.4 07/12/2017   HGBA1C 5.3 05/05/2017    Patient also reports that she is scheduled to come next week for her anxiety and depression follow-up. Patient was started on Wellbutrin and continue Sertraline.    Allergies  Allergen Reactions  . Codeine     nausea     Current Outpatient Medications:  .  ALPRAZolam (XANAX) 0.5 MG tablet, TAKE 1 TABLET BY MOUTH 3 TIMES DAILY AS NEEDED FOR ANXIETY, Disp: 90 tablet, Rfl: 5 .  buPROPion (WELLBUTRIN XL) 150 MG 24 hr tablet, Take 1 tablet (150 mg total) by mouth daily., Disp: 9 tablet, Rfl: 1 .  LUMIGAN 0.01 % SOLN, Place 1 drop into both eyes at bedtime. , Disp: , Rfl:  .  potassium chloride SA (K-DUR,KLOR-CON) 20 MEQ tablet, TAKE ONE TABLET BY MOUTH EVERY DAY, Disp: 90 tablet, Rfl: 3 .  Pseudoephedrine HCl (SINUS & ALLERGY 12 HOUR PO), Take by mouth., Disp: , Rfl:  .  sertraline (ZOLOFT) 100 MG tablet, Take 1 tablet (100 mg total) by mouth at bedtime., Disp: 90 tablet, Rfl: 1 .  traZODone (DESYREL) 100 MG tablet, TAKE ONE TABLET BY MOUTH EVERY EVENING AT BEDTIME, Disp: 90 tablet, Rfl: 3 .  triamterene-hydrochlorothiazide (MAXZIDE-25) 37.5-25 MG tablet, TAKE ONE TABLET BY MOUTH EVERY DAY, Disp: 90 tablet, Rfl: 3  Review of Systems  Constitutional: Negative.   Eyes: Positive for visual disturbance (macular degeneration).  Respiratory: Negative.   Cardiovascular: Negative for chest pain, palpitations and leg swelling.  Gastrointestinal: Negative.   Neurological: Negative.    Psychiatric/Behavioral: The patient is nervous/anxious.     Social History   Tobacco Use  . Smoking status: Never Smoker  . Smokeless tobacco: Never Used  Substance Use Topics  . Alcohol use: Yes    Alcohol/week: 1.8 oz    Types: 3 Glasses of wine per week    Comment: OCCASIONAL   Objective:   BP 130/80 (BP Location: Right Arm, Patient Position: Sitting, Cuff Size: Normal)   Pulse 62   Temp 98.2 F (36.8 C) (Oral)   Resp 16   Wt 163 lb 9.6 oz (74.2 kg)   SpO2 97%   BMI 27.22 kg/m    Physical Exam  Constitutional: She appears well-developed and well-nourished. No distress.  Neck: Normal range of motion. Neck supple.  Cardiovascular: Normal rate, regular rhythm and normal heart sounds. Exam reveals no gallop and no friction rub.  No murmur heard. Pulmonary/Chest: Effort normal and breath sounds normal. No respiratory distress. She has no wheezes. She has no rales.  Skin: She is not diaphoretic.  Vitals reviewed.   Depression screen [facility] 2/9 11/22/2017 11/22/2017 10/06/2017 08/24/2017 05/05/2017  Decreased Interest 3 3 3  0 1  Down, Depressed, Hopeless 3 3 3  0 1  PHQ - 2 Score 6 6 6  0 2  Altered sleeping 2 2 0 0 1  Tired, decreased energy 3 3 2 1  1  Change in appetite 0 0 0 0 0  Feeling bad or failure about yourself  2 2 1  0 0  Trouble concentrating 2 2 2  0 0  Moving slowly or fidgety/restless 0 0 1 0 0  Suicidal thoughts 0 0 0 0 0  PHQ-9 Score 15 15 12 1 4   Difficult doing work/chores Not difficult at all Not difficult at all Somewhat difficult - Not difficult at all       Assessment & Plan:     1. Visual disturbance A1c normal at 5.6. Patient reports having glasses changed last week and they seem to be working. She also feels it Rybicki be her cataracts and she is having "fuzzy" vision in her left eye, right eye has macular degeneration. She is to see her eye doctor for follow up in 2 weeks.  - POCT glycosylated hemoglobin (Hb A1C)  2. History of elevated glucose A1c  5.6 today. - POCT glycosylated hemoglobin (Hb A1C)  3. GAD (generalized anxiety disorder) Stable. Continue alprazolam 0.5mg  TID prn. Continue sertraline 100mg  at bedtime and wellbutrin 150mg  q am. I will see her back in 4 weeks for recheck. This will be a total of 8 weeks with wellbutrin. If no improvements will change therapy.   4. Depression, major, single episode, moderate ([place]) See above medical treatment plan.       Mar Daring, PA-C  [facility]

## 2017-12-02 DIAGNOSIS — H401131 Primary open-angle glaucoma, bilateral, mild stage: Secondary | ICD-10-CM | POA: Diagnosis not present

## 2017-12-06 ENCOUNTER — Telehealth: Payer: Self-pay | Admitting: Physician Assistant

## 2017-12-06 NOTE — Telephone Encounter (Signed)
I left a message asking the pt to call and schedule AWV. VDM (DD) °

## 2017-12-06 NOTE — Telephone Encounter (Signed)
Pt returned call and is scheduled for AWV with [facility] and CPE/Follow-up with [name] on 05/02/18. Thanks TNP

## 2017-12-07 NOTE — Telephone Encounter (Signed)
Noted, thank you.  -MM 

## 2017-12-08 DIAGNOSIS — H2513 Age-related nuclear cataract, bilateral: Secondary | ICD-10-CM | POA: Diagnosis not present

## 2017-12-10 ENCOUNTER — Ambulatory Visit: Payer: Medicare Other | Admitting: Physician Assistant

## 2017-12-13 ENCOUNTER — Telehealth: Payer: Self-pay | Admitting: Physician Assistant

## 2017-12-13 NOTE — Telephone Encounter (Signed)
Pt called saying the new antidepressant (wellbutrin) not working. She also read that the drug could cause her to have blurred vision.  She has been experiencing blurred vision.  No high BP.  She wants to know if she can go back on the  Zoloft.  Pt's CB is [phone]   Thanks Con Memos

## 2017-12-13 NOTE — Telephone Encounter (Signed)
Please Review

## 2017-12-14 NOTE — Telephone Encounter (Signed)
Yes she Bernards 

## 2017-12-14 NOTE — Telephone Encounter (Signed)
Patient advised as below. Patient verbalizes understanding and is in agreement with treatment plan.  

## 2017-12-23 ENCOUNTER — Encounter: Payer: Self-pay | Admitting: Physician Assistant

## 2017-12-23 ENCOUNTER — Ambulatory Visit (INDEPENDENT_AMBULATORY_CARE_PROVIDER_SITE_OTHER): Payer: Medicare Other | Admitting: Physician Assistant

## 2017-12-23 VITALS — BP 128/74 | HR 88 | Temp 97.7°F | Wt 160.4 lb

## 2017-12-23 DIAGNOSIS — F419 Anxiety disorder, unspecified: Secondary | ICD-10-CM | POA: Diagnosis not present

## 2017-12-23 DIAGNOSIS — F3341 Major depressive disorder, recurrent, in partial remission: Secondary | ICD-10-CM

## 2017-12-23 MED ORDER — SERTRALINE HCL 100 MG PO TABS: 200.0000 mg | ORAL_TABLET | Freq: Every day | ORAL | 1 refills | Status: DC

## 2017-12-23 NOTE — Progress Notes (Signed)
Patient: Jordan Gordon Female    DOB: Mar 20, 1934   84 y.o.   MRN: [id number] Visit Date: 12/23/2017  Today's Provider: Mar Daring, PA-C   Chief Complaint  Patient presents with  . Depression  . Anxiety   Subjective:    HPI GAD & Depression Follow Up:  Patient presents today for a follow up. Last follow up OV was on 11/03/17. Patient advised to continue Sertraline 100 mg at bedtime and Alprazolam 0.5 mg TID PRN. Discontinue Citalopram 40 mg due to ineffectiveness and start Wellbutrin 150 mg XL. She reports fair compliance with treatment plan. She stopped taking Wellbutrin and restarted Sertraline 100 mg (2 tablets) in the morning. Patient states she felt Wellbutrin was ineffective. Patient states symptoms are slightly improved since increasing Sertraline.    Allergies  Allergen Reactions  . Codeine     nausea     Current Outpatient Medications:  .  ALPRAZolam (XANAX) 0.5 MG tablet, TAKE 1 TABLET BY MOUTH 3 TIMES DAILY AS NEEDED FOR ANXIETY, Disp: 90 tablet, Rfl: 5 .  LUMIGAN 0.01 % SOLN, Place 1 drop into both eyes at bedtime. , Disp: , Rfl:  .  potassium chloride SA (K-DUR,KLOR-CON) 20 MEQ tablet, TAKE ONE TABLET BY MOUTH EVERY DAY, Disp: 90 tablet, Rfl: 3 .  Pseudoephedrine HCl (SINUS & ALLERGY 12 HOUR PO), Take by mouth., Disp: , Rfl:  .  sertraline (ZOLOFT) 100 MG tablet, Take 1 tablet (100 mg total) by mouth at bedtime. (Patient taking differently: Take 200 mg by mouth daily. ), Disp: 90 tablet, Rfl: 1 .  traZODone (DESYREL) 100 MG tablet, TAKE ONE TABLET BY MOUTH EVERY EVENING AT BEDTIME, Disp: 90 tablet, Rfl: 3 .  triamterene-hydrochlorothiazide (MAXZIDE-25) 37.5-25 MG tablet, TAKE ONE TABLET BY MOUTH EVERY DAY, Disp: 90 tablet, Rfl: 3  Review of Systems  Constitutional: Negative.   Respiratory: Negative.   Cardiovascular: Negative.   Neurological: Negative.   Psychiatric/Behavioral: Positive for dysphoric mood. The patient is nervous/anxious.     Social  History   Tobacco Use  . Smoking status: Never Smoker  . Smokeless tobacco: Never Used  Substance Use Topics  . Alcohol use: Yes    Alcohol/week: 1.8 oz    Types: 3 Glasses of wine per week    Comment: OCCASIONAL   Objective:   BP 128/74 (BP Location: Right Arm, Patient Position: Sitting, Cuff Size: Normal)   Pulse 88   Temp 97.7 F (36.5 C) (Oral)   Wt 160 lb 6.4 oz (72.8 kg)   SpO2 99%   BMI 26.69 kg/m    Physical Exam  Constitutional: She appears well-developed and well-nourished. No distress.  Neck: Normal range of motion. Neck supple.  Cardiovascular: Normal rate, regular rhythm and normal heart sounds. Exam reveals no gallop and no friction rub.  No murmur heard. Pulmonary/Chest: Effort normal and breath sounds normal. No respiratory distress. She has no wheezes. She has no rales.  Skin: She is not diaphoretic.  Psychiatric: She has a normal mood and affect. Her behavior is normal. Judgment and thought content normal.  Vitals reviewed.      Assessment & Plan:     1. Recurrent major depressive disorder, in partial remission ([place]) Improving with sertraline 200mg  nightly. Continue current dose. Call if symptoms worsen. I will see her back in Nov for her AWV/CPE. - sertraline (ZOLOFT) 100 MG tablet; Take 2 tablets (200 mg total) by mouth daily.  Dispense: 180 tablet; Refill: 1  2. Anxiety See above  medical treatment plan. - sertraline (ZOLOFT) 100 MG tablet; Take 2 tablets (200 mg total) by mouth daily.  Dispense: 180 tablet; Refill: [place], PA-C  [facility]

## 2017-12-28 ENCOUNTER — Encounter: Payer: Self-pay | Admitting: Student in an Organized Health Care Education/Training Program

## 2017-12-28 ENCOUNTER — Ambulatory Visit
Payer: Medicare Other | Attending: Student in an Organized Health Care Education/Training Program | Admitting: Student in an Organized Health Care Education/Training Program

## 2017-12-28 VITALS — BP 153/71 | HR 83 | Temp 98.0°F | Resp 16 | Ht 67.0 in | Wt 160.0 lb

## 2017-12-28 DIAGNOSIS — G894 Chronic pain syndrome: Secondary | ICD-10-CM

## 2017-12-28 DIAGNOSIS — H353212 Exudative age-related macular degeneration, right eye, with inactive choroidal neovascularization: Secondary | ICD-10-CM | POA: Diagnosis not present

## 2017-12-28 DIAGNOSIS — F209 Schizophrenia, unspecified: Secondary | ICD-10-CM | POA: Insufficient documentation

## 2017-12-28 DIAGNOSIS — F419 Anxiety disorder, unspecified: Secondary | ICD-10-CM | POA: Diagnosis not present

## 2017-12-28 DIAGNOSIS — M5136 Other intervertebral disc degeneration, lumbar region: Secondary | ICD-10-CM | POA: Insufficient documentation

## 2017-12-28 DIAGNOSIS — Z79899 Other long term (current) drug therapy: Secondary | ICD-10-CM | POA: Insufficient documentation

## 2017-12-28 DIAGNOSIS — K210 Gastro-esophageal reflux disease with esophagitis: Secondary | ICD-10-CM | POA: Diagnosis not present

## 2017-12-28 DIAGNOSIS — Z885 Allergy status to narcotic agent status: Secondary | ICD-10-CM | POA: Diagnosis not present

## 2017-12-28 DIAGNOSIS — I10 Essential (primary) hypertension: Secondary | ICD-10-CM | POA: Insufficient documentation

## 2017-12-28 DIAGNOSIS — M5442 Lumbago with sciatica, left side: Secondary | ICD-10-CM | POA: Insufficient documentation

## 2017-12-28 DIAGNOSIS — G4700 Insomnia, unspecified: Secondary | ICD-10-CM | POA: Diagnosis not present

## 2017-12-28 DIAGNOSIS — H353122 Nonexudative age-related macular degeneration, left eye, intermediate dry stage: Secondary | ICD-10-CM | POA: Diagnosis not present

## 2017-12-28 DIAGNOSIS — E785 Hyperlipidemia, unspecified: Secondary | ICD-10-CM | POA: Insufficient documentation

## 2017-12-28 DIAGNOSIS — F329 Major depressive disorder, single episode, unspecified: Secondary | ICD-10-CM | POA: Insufficient documentation

## 2017-12-28 DIAGNOSIS — G8929 Other chronic pain: Secondary | ICD-10-CM | POA: Diagnosis not present

## 2017-12-28 DIAGNOSIS — F429 Obsessive-compulsive disorder, unspecified: Secondary | ICD-10-CM | POA: Diagnosis not present

## 2017-12-28 MED ORDER — GABAPENTIN 100 MG PO CAPS: ORAL_CAPSULE | ORAL | 0 refills | Status: DC

## 2017-12-28 NOTE — Progress Notes (Signed)
Patient's Name: Jordan Gordon  MRN: [id number]  Referring Provider: Marin Olp, PA-C  DOB: December 08, 1933  PCP: Mar Daring, PA-C  DOS: 12/28/2017  Note by: Gillis Santa, MD  Service setting: Ambulatory outpatient  Specialty: Interventional Pain Management  Location: [facility] (AMB) Pain Management Facility  Visit type: Initial Patient Evaluation  Patient type: New Patient   Primary Reason(s) for Visit: Encounter for initial evaluation of one or more chronic problems (new to examiner) potentially causing chronic pain, and posing a threat to normal musculoskeletal function. (Level of risk: High) CC: Back Pain (lower left is worse )  HPI  Jordan Gordon is a 84 y.o. year old, female patient, who comes today to see [place] for the first time for an initial evaluation of her chronic pain. She has Anxiety; Mechanical and motor problems with internal organs; Adaptation reaction; Allergic rhinitis; Benign neoplasm of large bowel; Abnormal foot pulse; Clinical depression; Disorder resulting from impaired renal function; Essential (primary) hypertension; Fatigue; Blood glucose elevated; HLD (hyperlipidemia); Decreased potassium in the blood; Insomnia; Low back pain; Menopausal and perimenopausal disorder; Muscle ache; Osteoarthritis; Paresthesia of skin; Gastro-esophageal reflux disease with esophagitis; SOB (shortness of breath) on exertion; History of cardiac catheterization; and Chest pain on their problem list. Today she comes in for evaluation of her Back Pain (lower left is worse )  Pain Assessment: Location: Lower, Left Back Radiating: down the left leg into the thigh, lower calf and bridge of foot  Onset: More than a month ago Duration: Chronic pain Quality: Discomfort, Burning, Nagging, Numbness, Tingling Severity: 3 /10 (subjective, self-reported pain score)  Note: Reported level is compatible with observation.                         When using our objective Pain Scale, levels between 6 and 10/10 are said to  belong in an emergency room, as it progressively worsens from a 6/10, described as severely limiting, requiring emergency care not usually available at an outpatient pain management facility. At a 6/10 level, communication becomes difficult and requires great effort. Assistance to reach the emergency department Stroder be required. Facial flushing and profuse sweating along with potentially dangerous increases in heart rate and blood pressure will be evident. Effect on ADL: standing too long is difficult, walking can aggravate pain as well   Timing: Intermittent Modifying factors: aleve, rest  BP: (!) 153/71  HR: 83  Onset and Duration: Gradual Cause of pain: none listed Severity nothing noted Timing: Morning Aggravating Factors: Prolonged standing and Working Alleviating Factors: Sitting Associated Problems: Constipation, Depression, Fatigue and Numbness Quality of Pain: Dull Previous Examinations or Tests: X-rays Previous Treatments: Chiropractic manipulations and The patient denies further treatments  The patient comes into the clinics today for the first time for a chronic pain management evaluation.   84 year old female who presents with a chief complaint of axial low back pain that radiates down into her left leg past her knee occasionally to her left ankle.  Patient's pain has been present for many years, approximately 20 years but is gotten worse over the last 2 to 3 years.  Patient has been offered physical therapy in the past but states that her pain limits her ability to participate in physical therapy.  She has been evaluated by neurosurgery and a lumbar MRI was recommended but the patient has not obtained that yet.  She denies any bowel or bladder dysfunction.  No obvious weakness.  Patient takes Xanax 0.5 mg daily  to twice daily as needed.  She is not on any chronic opioid therapy.  Today I took the time to provide the patient with information regarding my pain practice. The patient  was informed that my practice is divided into two sections: an interventional pain management section, as well as a completely separate and distinct medication management section. I explained that I have procedure days for my interventional therapies, and evaluation days for follow-ups and medication management. Because of the amount of documentation required during both, they are kept separated. This means that there is the possibility that she Latella be scheduled for a procedure on one day, and medication management the next. I have also informed her that because of staffing and facility limitations, I no longer take patients for medication management only. To illustrate the reasons for this, I gave the patient the example of surgeons, and how inappropriate it would be to refer a patient to his/her care, just to write for the post-surgical antibiotics on a surgery done by a different surgeon.   Because interventional pain management is my board-certified specialty, the patient was informed that joining my practice means that they are open to any and all interventional therapies. I made it clear that this does not mean that they will be forced to have any procedures done. What this means is that I believe interventional therapies to be essential part of the diagnosis and proper management of chronic pain conditions. Therefore, patients not interested in these interventional alternatives will be better served under the care of a different practitioner.  The patient was also made aware of my Comprehensive Pain Management Safety Guidelines where by joining my practice, they limit all of their nerve blocks and joint injections to those done by our practice, for as long as we are retained to manage their care.   Historic Controlled Substance Pharmacotherapy Review  Historical Background Evaluation: [place] PMP: Six (6) year initial data search conducted.              Aberrant behavior: None observed or detected  today Risk factors for fatal opioid overdose: None identified today Fatal overdose hazard ratio (HR): Calculation deferred Non-fatal overdose hazard ratio (HR): Calculation deferred Risk of opioid abuse or dependence: 0.7-3.0% with doses ? 36 MME/day and 6.1-26% with doses ? 120 MME/day. Substance use disorder (SUD) risk level: See below Opioid risk tool (ORT) (Total Score): 3 Opioid Risk Tool - 12/28/17 1237      Family History of Substance Abuse   Alcohol  Negative    Illegal Drugs  Negative    Rx Drugs  Negative      Personal History of Substance Abuse   Alcohol  Negative    Illegal Drugs  Negative    Rx Drugs  Negative      Psychological Disease   Psychological Disease  Positive    ADD  Negative    OCD  Negative    Bipolar  Negative    Schizophrenia  Negative    Depression  Positive patient taking xanax and zoloft and reports that it is working well    patient taking xanax and zoloft and reports that it is working well      Total Score   Opioid Risk Tool Scoring  3    Opioid Risk Interpretation  Low Risk      ORT Scoring interpretation table:  Score <3 = Low Risk for SUD  Score between 4-7 = Moderate Risk for SUD  Score >8 = High  Risk for Opioid Abuse   PHQ-2 Depression Scale:  Total score:    PHQ-2 Scoring interpretation table: (Score and probability of major depressive disorder)  Score 0 = No depression  Score 1 = 15.4% Probability  Score 2 = 21.1% Probability  Score 3 = 38.4% Probability  Score 4 = 45.5% Probability  Score 5 = 56.4% Probability  Score 6 = 78.6% Probability   PHQ-9 Depression Scale:  Total score:    PHQ-9 Scoring interpretation table:  Score 0-4 = No depression  Score 5-9 = Mild depression  Score 10-14 = Moderate depression  Score 15-19 = Moderately severe depression  Score 20-27 = Severe depression (2.4 times higher risk of SUD and 2.89 times higher risk of overuse)   Pharmacologic Plan: As per protocol, I have not taken over any  controlled substance management, pending the results of ordered tests and/or consults.           We will focus on non-opioid and interventional-based pain management since the patient is on Xanax and want to avoid opioid therapy in a geriatric patient who is also on Xanax. Initial impression: Pending review of available data and ordered tests.  Meds   Current Outpatient Medications:  .  ALPRAZolam (XANAX) 0.5 MG tablet, TAKE 1 TABLET BY MOUTH 3 TIMES DAILY AS NEEDED FOR ANXIETY, Disp: 90 tablet, Rfl: 5 .  LUMIGAN 0.01 % SOLN, Place 1 drop into both eyes at bedtime. , Disp: , Rfl:  .  potassium chloride SA (K-DUR,KLOR-CON) 20 MEQ tablet, TAKE ONE TABLET BY MOUTH EVERY DAY, Disp: 90 tablet, Rfl: 3 .  sertraline (ZOLOFT) 100 MG tablet, Take 2 tablets (200 mg total) by mouth daily., Disp: 180 tablet, Rfl: 1 .  traZODone (DESYREL) 100 MG tablet, TAKE ONE TABLET BY MOUTH EVERY EVENING AT BEDTIME, Disp: 90 tablet, Rfl: 3 .  triamterene-hydrochlorothiazide (MAXZIDE-25) 37.5-25 MG tablet, TAKE ONE TABLET BY MOUTH EVERY DAY, Disp: 90 tablet, Rfl: 3 .  gabapentin (NEURONTIN) 100 MG capsule, 100 mg qhs on week 1, then 100 mg BID week 2, then 100 mg TID week 3 onwward, Disp: 90 capsule, Rfl: 0 .  Pseudoephedrine HCl (SINUS & ALLERGY 12 HOUR PO), Take by mouth., Disp: , Rfl:   Imaging Review  X-ray lumbar spine 2017 Structural scoliosis, diffuse spondylolisthesis along with significant degenerative disc disease Complexity Note: Imaging results reviewed. Results shared with Jordan Gordon, using Layman's terms.                         ROS  Cardiovascular: Chest pain Pulmonary or Respiratory: No reported pulmonary signs or symptoms such as wheezing and difficulty taking a deep full breath (Asthma), difficulty blowing air out (Emphysema), coughing up mucus (Bronchitis), persistent dry cough, or temporary stoppage of breathing during sleep Neurological: No reported neurological signs or symptoms such as seizures,  abnormal skin sensations, urinary and/or fecal incontinence, being born with an abnormal open spine and/or a tethered spinal cord Review of Past Neurological Studies: No results found for this or any previous visit. Psychological-Psychiatric: Anxiousness Gastrointestinal: Irregular, infrequent bowel movements (Constipation) Genitourinary: No reported renal or genitourinary signs or symptoms such as difficulty voiding or producing urine, peeing blood, non-functioning kidney, kidney stones, difficulty emptying the bladder, difficulty controlling the flow of urine, or chronic kidney disease Hematological: Brusing easily Endocrine: No reported endocrine signs or symptoms such as high or low blood sugar, rapid heart rate due to high thyroid levels, obesity or weight gain due  to slow thyroid or thyroid disease Rheumatologic: No reported rheumatological signs and symptoms such as fatigue, joint pain, tenderness, swelling, redness, heat, stiffness, decreased range of motion, with or without associated rash Musculoskeletal: Negative for myasthenia gravis, muscular dystrophy, multiple sclerosis or malignant hyperthermia Work History: Retired  Allergies  Jordan Gordon is allergic to codeine.  Laboratory Chemistry  Inflammation Markers (CRP: Acute Phase) (ESR: Chronic Phase) No results found for: CRP, ESRSEDRATE, LATICACIDVEN                       Rheumatology Markers No results found for: RF, ANA, LABURIC, URICUR, LYMEIGGIGMAB, LYMEABIGMQN, HLAB27                      Renal Function Markers Lab Results  Component Value Date   BUN 19 05/05/2017   CREATININE 0.98 (H) 05/05/2017   BCR 19 05/05/2017   GFRAA 63 05/05/2017   GFRNONAA 54 (L) 05/05/2017                             Hepatic Function Markers Lab Results  Component Value Date   AST 25 05/05/2017   ALT 24 05/05/2017   ALBUMIN 4.1 03/13/2016   ALKPHOS 63 03/13/2016                        Electrolytes Lab Results  Component Value Date    NA 141 05/05/2017   K 3.3 (L) 05/05/2017   CL 102 05/05/2017   CALCIUM 9.5 05/05/2017                        Neuropathy Markers Lab Results  Component Value Date   HGBA1C 5.6 11/22/2017                        Bone Pathology Markers No results found for: [place], [id number], KG4010UV2, [id number], 25OHVITD1, 25OHVITD2, 25OHVITD3, TESTOFREE, TESTOSTERONE                       Coagulation Parameters Lab Results  Component Value Date   PLT 201 05/05/2017                        Cardiovascular Markers Lab Results  Component Value Date   CKTOTAL 71 01/12/2013   CKMB 3.1 01/12/2013   TROPONINI < 0.02 06/02/2014   HGB 14.0 05/05/2017   HCT 39.5 05/05/2017                         CA Markers No results found for: CEA, CA125, LABCA2                      Note: Lab results reviewed.  PFSH  Drug: Jordan Gordon  reports that she does not use drugs. Alcohol:  reports that she drinks about 1.8 oz of alcohol per week. Tobacco:  reports that she has never smoked. She has never used smokeless tobacco. Medical:  has a past medical history of Anxiety, Hyperlipidemia, Hypertension, and Major depressive disorder. Family: family history includes Cancer in her brother and mother; Heart disease in her father.  Past Surgical History:  Procedure Laterality Date  . ABDOMINAL HYSTERECTOMY  1979  . BREAST EXCISIONAL BIOPSY Left 2005   benign  . COLONOSCOPY WITH  PROPOFOL N/A 07/22/2015   Procedure: COLONOSCOPY WITH PROPOFOL;  Surgeon: Manya Silvas, MD;  Location[facility] ENDOSCOPY;  Service: Endoscopy;  Laterality: N/A;  . COLONOSCOPY WITH PROPOFOL N/A 10/19/2016   Procedure: COLONOSCOPY WITH PROPOFOL;  Surgeon: Manya Silvas, MD;  Location: [facility] ENDOSCOPY;  Service: Endoscopy;  Laterality: N/A;  . KNEE SURGERY Left 2011   2007  . TONSILLECTOMY  1940   ADENOIDS   Active Ambulatory Problems    Diagnosis Date Noted  . Anxiety 01/07/2015  . Mechanical and motor problems with internal organs  04/18/2015  . Adaptation reaction 07/21/2006  . Allergic rhinitis 03/12/1998  . 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 Abnormal foot pulse 04/18/2015  . Clinical depression 04/18/2015  . Disorder resulting from impaired renal function 07/29/2009  . Essential (primary) hypertension 04/16/1998  . Fatigue 04/18/2015  . Blood glucose elevated 04/18/2015  . HLD (hyperlipidemia) 12/06/1991  . Decreased potassium in the blood 04/18/2015  . Insomnia 03/04/1998  . Low back pain 04/18/2015  . Menopausal and perimenopausal disorder 02/02/1997  . Muscle ache 03/15/2009  . Osteoarthritis 07/21/2006  . Paresthesia of skin 04/18/2015  . Gastro-esophageal reflux disease with esophagitis 04/18/2015  . SOB (shortness of breath) on exertion 09/26/2008  . History of cardiac catheterization 03/08/2014  . Chest pain 09/20/2014   Resolved Ambulatory Problems    Diagnosis Date Noted  . Hypertension 01/07/2015  . Cramp in limb 04/18/2015  . Colon polyp 04/18/2015  . Other specified symptoms and signs involving the circulatory and respiratory systems 04/18/2015   Past Medical History:  Diagnosis Date  . Anxiety   . Hyperlipidemia   . Hypertension   . Major depressive disorder    Constitutional Exam  General appearance: Well nourished, well developed, and well hydrated. In no apparent acute distress Vitals:   12/28/17 1231  BP: (!) 153/71  Pulse: 83  Resp: 16  Temp: 98 F (36.7 C)  TempSrc: Oral  SpO2: 100%  Weight: 160 lb (72.6 kg)  Height: 5' 7"  (1.702 m)   BMI Assessment: Estimated body mass index is 25.06 kg/m as calculated from the following:   Height as of this encounter: 5' 7"  (1.702 m).   Weight as of this encounter: 160 lb (72.6 kg).  BMI interpretation table: BMI level Category Range association with higher incidence of chronic pain  <18 kg/m2 Underweight   18.5-24.9 kg/m2 Ideal body weight   25-29.9 kg/m2 Overweight Increased incidence by 20%  30-34.9 kg/m2 Obese  (Class I) Increased incidence by 68%  35-39.9 kg/m2 Severe obesity (Class II) Increased incidence by 136%  >40 kg/m2 Extreme obesity (Class III) Increased incidence by 254%   Patient's current BMI Ideal Body weight  Body mass index is 25.06 kg/m. Ideal body weight: 61.6 kg (135 lb 12.9 oz) Adjusted ideal body weight: 66 kg (145 lb 7.7 oz)   BMI Readings from Last 4 Encounters:  12/28/17 25.06 kg/m  12/23/17 26.69 kg/m  11/22/17 27.22 kg/m  11/03/17 27.49 kg/m   Wt Readings from Last 4 Encounters:  12/28/17 160 lb (72.6 kg)  12/23/17 160 lb 6.4 oz (72.8 kg)  11/22/17 163 lb 9.6 oz (74.2 kg)  11/03/17 165 lb 3.2 oz (74.9 kg)  Psych/Mental status: Alert, oriented x 3 (person, place, & time)       Eyes: PERLA Respiratory: No evidence of acute respiratory distress  Cervical Spine Area Exam  Skin & Axial Inspection: No masses, redness, edema, swelling, or associated skin lesions Alignment: Symmetrical Functional ROM:  Unrestricted ROM      Stability: No instability detected Muscle Tone/Strength: Functionally intact. No obvious neuro-muscular anomalies detected. Sensory (Neurological): Unimpaired Palpation: No palpable anomalies              Upper Extremity (UE) Exam    Side: Right upper extremity  Side: Left upper extremity  Skin & Extremity Inspection: Skin color, temperature, and hair growth are WNL. No peripheral edema or cyanosis. No masses, redness, swelling, asymmetry, or associated skin lesions. No contractures.  Skin & Extremity Inspection: Skin color, temperature, and hair growth are WNL. No peripheral edema or cyanosis. No masses, redness, swelling, asymmetry, or associated skin lesions. No contractures.  Functional ROM: Unrestricted ROM          Functional ROM: Unrestricted ROM          Muscle Tone/Strength: Functionally intact. No obvious neuro-muscular anomalies detected.  Muscle Tone/Strength: Functionally intact. No obvious neuro-muscular anomalies detected.  Sensory  (Neurological): Unimpaired          Sensory (Neurological): Unimpaired          Palpation: No palpable anomalies              Palpation: No palpable anomalies              Provocative Test(s):  Phalen's test: deferred Tinel's test: deferred Apley's scratch test (touch opposite shoulder):  Action 1 (Across chest): deferred Action 2 (Overhead): deferred Action 3 (LB reach): deferred   Provocative Test(s):  Phalen's test: deferred Tinel's test: deferred Apley's scratch test (touch opposite shoulder):  Action 1 (Across chest): deferred Action 2 (Overhead): deferred Action 3 (LB reach): deferred    Thoracic Spine Area Exam  Skin & Axial Inspection: No masses, redness, or swelling Alignment: Symmetrical Functional ROM: Unrestricted ROM Stability: No instability detected Muscle Tone/Strength: Functionally intact. No obvious neuro-muscular anomalies detected. Sensory (Neurological): Unimpaired Muscle strength & Tone: No palpable anomalies  Lumbar Spine Area Exam  Skin & Axial Inspection: No masses, redness, or swelling Alignment: Symmetrical Functional ROM: Decreased ROM affecting primarily the left Stability: No instability detected Muscle Tone/Strength: Functionally intact. No obvious neuro-muscular anomalies detected. Sensory (Neurological): Unimpaired Palpation: No palpable anomalies       Provocative Tests: Lumbar Hyperextension/rotation test: (+) bilaterally for facet joint pain. Lumbar quadrant test (Kemp's test): deferred today       Lumbar Lateral bending test: (+) ipsilateral radicular pain, on the left. Positive for left-sided foraminal stenosis. Patrick's Maneuver: deferred today                   FABER test: deferred today                   Thigh-thrust test: deferred today       S-I compression test: deferred today       S-I distraction test: deferred today        Gait & Posture Assessment  Ambulation: Unassisted Gait: Relatively normal for age and body  habitus Posture: WNL   Lower Extremity Exam    Side: Right lower extremity  Side: Left lower extremity  Stability: No instability observed          Stability: No instability observed          Skin & Extremity Inspection: Skin color, temperature, and hair growth are WNL. No peripheral edema or cyanosis. No masses, redness, swelling, asymmetry, or associated skin lesions. No contractures.  Skin & Extremity Inspection: Skin color, temperature, and hair growth are WNL. No  peripheral edema or cyanosis. No masses, redness, swelling, asymmetry, or associated skin lesions. No contractures.  Functional ROM: Unrestricted ROM                  Functional ROM: Unrestricted ROM                  Muscle Tone/Strength: Functionally intact. No obvious neuro-muscular anomalies detected.  Muscle Tone/Strength: Functionally intact. No obvious neuro-muscular anomalies detected.  Sensory (Neurological): Unimpaired  Sensory (Neurological): Unimpaired  Palpation: No palpable anomalies  Palpation: No palpable anomalies   Assessment  Primary Diagnosis & Pertinent Problem List: The primary encounter diagnosis was Chronic left-sided low back pain with left-sided sciatica. Diagnoses of Lumbar degenerative disc disease and Chronic pain syndrome were also pertinent to this visit.  Visit Diagnosis (New problems to examiner): 1. Chronic left-sided low back pain with left-sided sciatica   2. Lumbar degenerative disc disease   3. Chronic pain syndrome    84 year old female with a history of chronic axial low back pain that radiates down her left leg and left thigh usually stopping at her ankle.  Patient has been evaluated by neurosurgery and a lumbar MRI was recommended but the patient has not obtained it yet.  I recommended the patient follow-up with her lumbar MRI.  Pending results from lumbar MRI can further discuss treatment plan which [place] include lumbar epidural steroid injection on the left to target her symptoms of lumbar  radiculopathy.  If the patient has a facet mediated component that is contributing to her axial low back pain, MRI results will help clarify that and can offer patient diagnostic lumbar facet medial branch nerve blocks to help treat lumbar facet mediated pain.  We will also prescribe the patient gabapentin as below to help out with her symptoms of neuropathy.  I also reviewed basic exercises that the patient can do at home to help out with her lumbar paraspinal muscle strengthening and range of motion.  Plan: -Encouraged patient to follow through on lumbar MRI that was recommended. -Patient will follow-up after lumbar MRI to discuss treatment plan -UDS today.  Should be positive for Xanax and its metabolites -Prescription for gabapentin as below  Note: Please be advised that as per protocol, today's visit has been an evaluation only. We have not taken over the patient's controlled substance management.  Ordered Lab-work, Procedure(s), Referral(s), & Consult(s): Orders Placed This Encounter  Procedures  . Compliance Drug Analysis, Ur   Pharmacotherapy (current): Medications ordered:  Meds ordered this encounter  Medications  . gabapentin (NEURONTIN) 100 MG capsule    Sig: 100 mg qhs on week 1, then 100 mg BID week 2, then 100 mg TID week 3 onwward    Dispense:  90 capsule    Refill:  0    Do not place this medication, or any other prescription from our practice, on "Automatic Refill". Patient [place] have prescription filled one day early if pharmacy is closed on scheduled refill date.   Medications administered during this visit: Jordan Gordon had no medications administered during this visit.   Pharmacological management options:  Opioid Analgesics: The patient was informed that there is no guarantee that she would be a candidate for opioid analgesics. The decision will be made following CDC guidelines. This decision will be based on the results of diagnostic studies, as well as Jordan Gordon's  risk profile.   Membrane stabilizer: To be determined at a later time  Muscle relaxant: To be determined at a  later time  NSAID: To be determined at a later time  Other analgesic(s): To be determined at a later time   Interventional management options: Jordan Gordon was informed that there is no guarantee that she would be a candidate for interventional therapies. The decision will be based on the results of diagnostic studies, as well as Jordan Gordon's risk profile.  Procedure(s) under consideration:  Lumbar epidural steroid injection Lumbar facet medial branch nerve blocks Bilateral SI joint injection   Provider-requested follow-up: Return in about 3 weeks (around 01/18/2018) for Medication Management, After Imaging.  Future Appointments  Date Time Provider [place]  01/03/2018  8:00 AM [facility] 1 [facility]  05/02/2018  9:20 AM [facility]-NURSE HEALTH ADVISOR [facility] None  05/02/2018 10:00 AM Burnette, Clearnce Sorrel, PA-C [facility] None    Primary Care Physician: Rubye Beach Location: [facility] Note by: Gillis Santa, M.D, Date: 12/28/2017; Time: 3:14 PM  Patient Instructions  1. Please follow up regarding your lumbar MRI.  2. Gabapentin as below  100 mg at night x 1 week 100 mg twice daily on week 2 Then 100 mg three times a day on week 3  3. Follow up in 2-3 weeks  4. UDS today  5. Simple stretching

## 2017-12-28 NOTE — Progress Notes (Signed)
Safety precautions to be maintained throughout the outpatient stay will include: orient to surroundings, keep bed in low position, maintain call bell within reach at all times, provide assistance with transfer out of bed and ambulation.  

## 2017-12-28 NOTE — Patient Instructions (Addendum)
1. Please follow up regarding your lumbar MRI.  2. Gabapentin as below  100 mg at night x 1 week 100 mg twice daily on week 2 Then 100 mg three times a day on week 3  3. Follow up in 2-3 weeks  4. UDS today  5. Simple stretching

## 2017-12-30 ENCOUNTER — Other Ambulatory Visit: Payer: Self-pay | Admitting: Physician Assistant

## 2017-12-30 DIAGNOSIS — G4700 Insomnia, unspecified: Secondary | ICD-10-CM

## 2017-12-31 LAB — COMPLIANCE DRUG ANALYSIS, UR

## 2018-01-03 ENCOUNTER — Ambulatory Visit
Admission: RE | Admit: 2018-01-03 | Discharge: 2018-01-03 | Disposition: A | Discharge status: Home / Self Care | Payer: Medicare Other | Source: Ambulatory Visit | Attending: Student | Admitting: Student

## 2018-01-03 DIAGNOSIS — M79605 Pain in left leg: Secondary | ICD-10-CM | POA: Diagnosis not present

## 2018-01-03 DIAGNOSIS — M4807 Spinal stenosis, lumbosacral region: Secondary | ICD-10-CM | POA: Insufficient documentation

## 2018-01-03 DIAGNOSIS — M545 Low back pain: Secondary | ICD-10-CM | POA: Insufficient documentation

## 2018-01-03 DIAGNOSIS — M48061 Spinal stenosis, lumbar region without neurogenic claudication: Secondary | ICD-10-CM | POA: Insufficient documentation

## 2018-01-03 DIAGNOSIS — M5136 Other intervertebral disc degeneration, lumbar region: Secondary | ICD-10-CM | POA: Insufficient documentation

## 2018-01-03 DIAGNOSIS — M4186 Other forms of scoliosis, lumbar region: Secondary | ICD-10-CM | POA: Diagnosis not present

## 2018-01-03 DIAGNOSIS — R200 Anesthesia of skin: Secondary | ICD-10-CM | POA: Insufficient documentation

## 2018-01-20 ENCOUNTER — Encounter: Payer: Self-pay | Admitting: Student in an Organized Health Care Education/Training Program

## 2018-01-20 ENCOUNTER — Other Ambulatory Visit: Payer: Self-pay

## 2018-01-20 ENCOUNTER — Ambulatory Visit
Payer: Medicare Other | Attending: Student in an Organized Health Care Education/Training Program | Admitting: Student in an Organized Health Care Education/Training Program

## 2018-01-20 VITALS — BP 173/74 | HR 98 | Temp 98.3°F | Resp 16 | Ht 67.0 in | Wt 160.0 lb

## 2018-01-20 DIAGNOSIS — F419 Anxiety disorder, unspecified: Secondary | ICD-10-CM | POA: Diagnosis not present

## 2018-01-20 DIAGNOSIS — E785 Hyperlipidemia, unspecified: Secondary | ICD-10-CM | POA: Insufficient documentation

## 2018-01-20 DIAGNOSIS — I10 Essential (primary) hypertension: Secondary | ICD-10-CM | POA: Diagnosis not present

## 2018-01-20 DIAGNOSIS — Z791 Long term (current) use of non-steroidal anti-inflammatories (NSAID): Secondary | ICD-10-CM | POA: Insufficient documentation

## 2018-01-20 DIAGNOSIS — Z760 Encounter for issue of repeat prescription: Secondary | ICD-10-CM | POA: Diagnosis not present

## 2018-01-20 DIAGNOSIS — M5416 Radiculopathy, lumbar region: Secondary | ICD-10-CM | POA: Diagnosis not present

## 2018-01-20 DIAGNOSIS — Z79899 Other long term (current) drug therapy: Secondary | ICD-10-CM | POA: Diagnosis not present

## 2018-01-20 DIAGNOSIS — M5442 Lumbago with sciatica, left side: Secondary | ICD-10-CM

## 2018-01-20 DIAGNOSIS — F329 Major depressive disorder, single episode, unspecified: Secondary | ICD-10-CM | POA: Insufficient documentation

## 2018-01-20 DIAGNOSIS — G8929 Other chronic pain: Secondary | ICD-10-CM

## 2018-01-20 DIAGNOSIS — Z885 Allergy status to narcotic agent status: Secondary | ICD-10-CM | POA: Diagnosis not present

## 2018-01-20 DIAGNOSIS — M48061 Spinal stenosis, lumbar region without neurogenic claudication: Secondary | ICD-10-CM | POA: Diagnosis not present

## 2018-01-20 DIAGNOSIS — K210 Gastro-esophageal reflux disease with esophagitis: Secondary | ICD-10-CM | POA: Diagnosis not present

## 2018-01-20 DIAGNOSIS — M5136 Other intervertebral disc degeneration, lumbar region: Secondary | ICD-10-CM | POA: Diagnosis not present

## 2018-01-20 DIAGNOSIS — G894 Chronic pain syndrome: Secondary | ICD-10-CM

## 2018-01-20 MED ORDER — GABAPENTIN 100 MG PO CAPS: ORAL_CAPSULE | ORAL | 3 refills | Status: DC

## 2018-01-20 NOTE — Progress Notes (Signed)
Safety precautions to be maintained throughout the outpatient stay will include: orient to surroundings, keep bed in low position, maintain call bell within reach at all times, provide assistance with transfer out of bed and ambulation.  

## 2018-01-20 NOTE — Patient Instructions (Signed)
Gabapentin has been escribed to your pharmacy. GENERAL RISKS AND COMPLICATIONS  What are the risk, side effects and possible complications? Generally speaking, most procedures are safe.  However, with any procedure there are risks, side effects, and the possibility of complications.  The risks and complications are dependent upon the sites that are lesioned, or the type of nerve block to be performed.  The closer the procedure is to the spine, the more serious the risks are.  Great care is taken when placing the radio frequency needles, block needles or lesioning probes, but sometimes complications can occur. 1. Infection: Any time there is an injection through the skin, there is a risk of infection.  This is why sterile conditions are used for these blocks.  There are four possible types of infection. 1. Localized skin infection. 2. Central Nervous System Infection-This can be in the form of Meningitis, which can be deadly. 3. Epidural Infections-This can be in the form of an epidural abscess, which can cause pressure inside of the spine, causing compression of the spinal cord with subsequent paralysis. This would require an emergency surgery to decompress, and there are no guarantees that the patient would recover from the paralysis. 4. Discitis-This is an infection of the intervertebral discs.  It occurs in about 1% of discography procedures.  It is difficult to treat and it Grondin lead to surgery.        2. Pain: the needles have to go through skin and soft tissues, will cause soreness.       3. Damage to internal structures:  The nerves to be lesioned Anderegg be near blood vessels or    other nerves which can be potentially damaged.       4. Bleeding: Bleeding is more common if the patient is taking blood thinners such as  aspirin, Coumadin, Ticiid, Plavix, etc., or if he/she have some genetic predisposition  such as hemophilia. Bleeding into the spinal canal can cause compression of the spinal  cord  with subsequent paralysis.  This would require an emergency surgery to  decompress and there are no guarantees that the patient would recover from the  paralysis.       5. Pneumothorax:  Puncturing of a lung is a possibility, every time a needle is introduced in  the area of the chest or upper back.  Pneumothorax refers to free air around the  collapsed lung(s), inside of the thoracic cavity (chest cavity).  Another two possible  complications related to a similar event would include: Hemothorax and Chylothorax.   These are variations of the Pneumothorax, where instead of air around the collapsed  lung(s), you Remillard have blood or chyle, respectively.       6. Spinal headaches: They Parma occur with any procedures in the area of the spine.       7. Persistent CSF (Cerebro-Spinal Fluid) leakage: This is a rare problem, but Mckeough occur  with prolonged intrathecal or epidural catheters either due to the formation of a fistulous  track or a dural tear.       8. Nerve damage: By working so close to the spinal cord, there is always a possibility of  nerve damage, which could be as serious as a permanent spinal cord injury with  paralysis.       9. Death:  Although rare, severe deadly allergic reactions known as "Anaphylactic  reaction" can occur to any of the medications used.      10. Worsening of the symptoms:  We can always make thing worse.  What are the chances of something like this happening? Chances of any of this occuring are extremely low.  By statistics, you have more of a chance of getting killed in a motor vehicle accident: while driving to the hospital than any of the above occurring .  Nevertheless, you should be aware that they are possibilities.  In general, it is similar to taking a shower.  Everybody knows that you can slip, hit your head and get killed.  Does that mean that you should not shower again?  Nevertheless always keep in mind that statistics do not mean anything if you happen to be on the  wrong side of them.  Even if a procedure has a 1 (one) in a 1,000,000 (million) chance of going wrong, it you happen to be that one..Also, keep in mind that by statistics, you have more of a chance of having something go wrong when taking medications.  Who should not have this procedure? If you are on a blood thinning medication (e.g. Coumadin, Plavix, see list of "Blood Thinners"), or if you have an active infection going on, you should not have the procedure.  If you are taking any blood thinners, please inform your physician.  How should I prepare for this procedure?  Do not eat or drink anything at least six hours prior to the procedure.  Bring a driver with you .  It cannot be a taxi.  Come accompanied by an adult that can drive you back, and that is strong enough to help you if your legs get weak or numb from the local anesthetic.  Take all of your medicines the morning of the procedure with just enough water to swallow them.  If you have diabetes, make sure that you are scheduled to have your procedure done first thing in the morning, whenever possible.  If you have diabetes, take only half of your insulin dose and notify our nurse that you have done so as soon as you arrive at the clinic.  If you are diabetic, but only take blood sugar pills (oral hypoglycemic), then do not take them on the morning of your procedure.  You Thau take them after you have had the procedure.  Do not take aspirin or any aspirin-containing medications, at least eleven (11) days prior to the procedure.  They Crite prolong bleeding.  Wear loose fitting clothing that Kesinger be easy to take off and that you would not mind if it got stained with Betadine or blood.  Do not wear any jewelry or perfume  Remove any nail coloring.  It will interfere with some of our monitoring equipment.  NOTE: Remember that this is not meant to be interpreted as a complete list of all possible complications.  Unforeseen problems Donathan  occur.  BLOOD THINNERS The following drugs contain aspirin or other products, which can cause increased bleeding during surgery and should not be taken for 2 weeks prior to and 1 week after surgery.  If you should need take something for relief of minor pain, you Ferraz take acetaminophen which is found in Tylenol,m Datril, Anacin-3 and Panadol. It is not blood thinner. The products listed below are.  Do not take any of the products listed below in addition to any listed on your instruction sheet.  A.P.C or A.P.C with Codeine Codeine Phosphate Capsules #3 Ibuprofen Ridaura  ABC compound Congesprin Imuran rimadil  Advil Cope Indocin Robaxisal  Alka-Seltzer Effervescent Pain Reliever and Antacid  Coricidin or Coricidin-D  Indomethacin Rufen  Alka-Seltzer plus Cold Medicine Cosprin Ketoprofen S-A-C Tablets  Anacin Analgesic Tablets or Capsules Coumadin Korlgesic Salflex  Anacin Extra Strength Analgesic tablets or capsules CP-2 Tablets Lanoril Salicylate  Anaprox Cuprimine Capsules Levenox Salocol  Anexsia-D Dalteparin Magan Salsalate  Anodynos Darvon compound Magnesium Salicylate Sine-off  Ansaid Dasin Capsules Magsal Sodium Salicylate  Anturane Depen Capsules Marnal Soma  APF Arthritis pain formula Dewitt's Pills Measurin Stanback  Argesic Dia-Gesic Meclofenamic Sulfinpyrazone  Arthritis Bayer Timed Release Aspirin Diclofenac Meclomen Sulindac  Arthritis pain formula Anacin Dicumarol Medipren Supac  Analgesic (Safety coated) Arthralgen Diffunasal Mefanamic Suprofen  Arthritis Strength Bufferin Dihydrocodeine Mepro Compound Suprol  Arthropan liquid Dopirydamole Methcarbomol with Aspirin Synalgos  ASA tablets/Enseals Disalcid Micrainin Tagament  Ascriptin Doan's Midol Talwin  Ascriptin A/D Dolene Mobidin Tanderil  Ascriptin Extra Strength Dolobid Moblgesic Ticlid  Ascriptin with Codeine Doloprin or Doloprin with Codeine Momentum Tolectin  Asperbuf Duoprin Mono-gesic Trendar  Aspergum Duradyne  Motrin or Motrin IB Triminicin  Aspirin plain, buffered or enteric coated Durasal Myochrisine Trigesic  Aspirin Suppositories Easprin Nalfon Trillsate  Aspirin with Codeine Ecotrin Regular or Extra Strength Naprosyn Uracel  Atromid-S Efficin Naproxen Ursinus  Auranofin Capsules Elmiron Neocylate Vanquish  Axotal Emagrin Norgesic Verin  Azathioprine Empirin or Empirin with Codeine Normiflo Vitamin E  Azolid Emprazil Nuprin Voltaren  Bayer Aspirin plain, buffered or children's or timed BC Tablets or powders Encaprin Orgaran Warfarin Sodium  Buff-a-Comp Enoxaparin Orudis Zorpin  Buff-a-Comp with Codeine Equegesic Os-Cal-Gesic   Buffaprin Excedrin plain, buffered or Extra Strength Oxalid   Bufferin Arthritis Strength Feldene Oxphenbutazone   Bufferin plain or Extra Strength Feldene Capsules Oxycodone with Aspirin   Bufferin with Codeine Fenoprofen Fenoprofen Pabalate or Pabalate-SF   Buffets II Flogesic Panagesic   Buffinol plain or Extra Strength Florinal or Florinal with Codeine Panwarfarin   Buf-Tabs Flurbiprofen Penicillamine   Butalbital Compound Four-way cold tablets Penicillin   Butazolidin Fragmin Pepto-Bismol   Carbenicillin Geminisyn Percodan   Carna Arthritis Reliever Geopen Persantine   Carprofen Gold's salt Persistin   Chloramphenicol Goody's Phenylbutazone   Chloromycetin Haltrain Piroxlcam   Clmetidine heparin Plaquenil   Cllnoril Hyco-pap Ponstel   Clofibrate Hydroxy chloroquine Propoxyphen         Before stopping any of these medications, be sure to consult the physician who ordered them.  Some, such as Coumadin (Warfarin) are ordered to prevent or treat serious conditions such as "deep thrombosis", "pumonary embolisms", and other heart problems.  The amount of time that you Tailor need off of the medication Roedel also vary with the medication and the reason for which you were taking it.  If you are taking any of these medications, please make sure you notify your pain  physician before you undergo any procedures.          Moderate Conscious Sedation, Adult Sedation is the use of medicines to promote relaxation and relieve discomfort and anxiety. Moderate conscious sedation is a type of sedation. Under moderate conscious sedation, you are less alert than normal, but you are still able to respond to instructions, touch, or both. Moderate conscious sedation is used during short medical and dental procedures. It is milder than deep sedation, which is a type of sedation under which you cannot be easily woken up. It is also milder than general anesthesia, which is the use of medicines to make you unconscious. Moderate conscious sedation allows you to return to your regular activities sooner. Tell a health care provider about:  Any  allergies you have.  All medicines you are taking, including vitamins, herbs, eye drops, creams, and over-the-counter medicines.  Use of steroids (by mouth or creams).  Any problems you or family members have had with sedatives and anesthetic medicines.  Any blood disorders you have.  Any surgeries you have had.  Any medical conditions you have, such as sleep apnea.  Whether you are pregnant or Song be pregnant.  Any use of cigarettes, alcohol, marijuana, or street drugs. What are the risks? Generally, this is a safe procedure. However, problems Guadalupe occur, including:  Getting too much medicine (oversedation).  Nausea.  Allergic reaction to medicines.  Trouble breathing. If this happens, a breathing tube Bringhurst be used to help with breathing. It will be removed when you are awake and breathing on your own.  Heart trouble.  Lung trouble.  What happens before the procedure? Staying hydrated Follow instructions from your health care provider about hydration, which Fishburn include:  Up to 2 hours before the procedure - you Hollenbeck continue to drink clear liquids, such as water, clear fruit juice, black coffee, and plain  tea.  Eating and drinking restrictions Follow instructions from your health care provider about eating and drinking, which Pring include:  8 hours before the procedure - stop eating heavy meals or foods such as meat, fried foods, or fatty foods.  6 hours before the procedure - stop eating light meals or foods, such as toast or cereal.  6 hours before the procedure - stop drinking milk or drinks that contain milk.  2 hours before the procedure - stop drinking clear liquids.  Medicine  Ask your health care provider about:  Changing or stopping your regular medicines. This is especially important if you are taking diabetes medicines or blood thinners.  Taking medicines such as aspirin and ibuprofen. These medicines can thin your blood. Do not take these medicines before your procedure if your health care provider instructs you not to.  Tests and exams  You will have a physical exam.  You Keng have blood tests done to show: ? How well your kidneys and liver are working. ? How well your blood can clot. General instructions  Plan to have someone take you home from the hospital or clinic.  If you will be going home right after the procedure, plan to have someone with you for 24 hours. What happens during the procedure?  An IV tube will be inserted into one of your veins.  Medicine to help you relax (sedative) will be given through the IV tube.  The medical or dental procedure will be performed. What happens after the procedure?  Your blood pressure, heart rate, breathing rate, and blood oxygen level will be monitored often until the medicines you were given have worn off.  Do not drive for 24 hours. This information is not intended to replace advice given to you by your health care provider. Make sure you discuss any questions you have with your health care provider. Document Released: 02/17/2001 Document Revised: 10/29/2015 Document Reviewed: 09/14/2015 Elsevier Interactive  Patient Education  2018 [place]. Epidural Steroid Injection Patient Information  Description: The epidural space surrounds the nerves as they exit the spinal cord.  In some patients, the nerves can be compressed and inflamed by a bulging disc or a tight spinal canal (spinal stenosis).  By injecting steroids into the epidural space, we can bring irritated nerves into direct contact with a potentially helpful medication.  These steroids act directly on the  irritated nerves and can reduce swelling and inflammation which often leads to decreased pain.  Epidural steroids Strieter be injected anywhere along the spine and from the neck to the low back depending upon the location of your pain.   After numbing the skin with local anesthetic (like Novocaine), a small needle is passed into the epidural space slowly.  You Kiernan experience a sensation of pressure while this is being done.  The entire block usually last less than 10 minutes.  Conditions which Weisel be treated by epidural steroids:   Low back and leg pain  Neck and arm pain  Spinal stenosis  Post-laminectomy syndrome  Herpes zoster (shingles) pain  Pain from compression fractures  Preparation for the injection:  1. Do not eat any solid food or dairy products within 8 hours of your appointment.  2. You Aylward drink clear liquids up to 3 hours before appointment.  Clear liquids include water, black coffee, juice or soda.  No milk or cream please. 3. You Densmore take your regular medication, including pain medications, with a sip of water before your appointment  Diabetics should hold regular insulin (if taken separately) and take 1/2 normal NPH dos the morning of the procedure.  Carry some sugar containing items with you to your appointment. 4. A driver must accompany you and be prepared to drive you home after your procedure.  5. Bring all your current medications with your. 6. An IV Hund be inserted and sedation Offer be given at the discretion of  the physician.   7. A blood pressure cuff, EKG and other monitors will often be applied during the procedure.  Some patients Geffert need to have extra oxygen administered for a short period. 8. You will be asked to provide medical information, including your allergies, prior to the procedure.  We must know immediately if you are taking blood thinners (like Coumadin/Warfarin)  Or if you are allergic to IV iodine contrast (dye). We must know if you could possible be pregnant.  Possible side-effects:  Bleeding from needle site  Infection (rare, Bruins require surgery)  Nerve injury (rare)  Numbness & tingling (temporary)  Difficulty urinating (rare, temporary)  Spinal headache ( a headache worse with upright posture)  Light -headedness (temporary)  Pain at injection site (several days)  Decreased blood pressure (temporary)  Weakness in arm/leg (temporary)  Pressure sensation in back/neck (temporary)  Call if you experience:  Fever/chills associated with headache or increased back/neck pain.  Headache worsened by an upright position.  New onset weakness or numbness of an extremity below the injection site  Hives or difficulty breathing (go to the emergency room)  Inflammation or drainage at the infection site  Severe back/neck pain  Any new symptoms which are concerning to you  Please note:  Although the local anesthetic injected can often make your back or neck feel good for several hours after the injection, the pain will likely return.  It takes 3-7 days for steroids to work in the epidural space.  You Almquist not notice any pain relief for at least that one week.  If effective, we will often do a series of three injections spaced 3-6 weeks apart to maximally decrease your pain.  After the initial series, we generally will wait several months before considering a repeat injection of the same type.  If you have any questions, please call [phone] [facility]

## 2018-01-20 NOTE — Progress Notes (Signed)
Patient's Name: Jordan Gordon  MRN: [id number]  Referring Provider: Mar Daring, [place]*  DOB: Santoli 13, 1935  PCP: Mar Daring, PA-C  DOS: 01/20/2018  Note by: Gillis Santa, MD  Service setting: Ambulatory outpatient  Specialty: Interventional Pain Management  Location: [facility] (AMB) Pain Management Facility    Patient type: Established   Primary Reason(s) for Visit: Encounter for prescription drug management. (Level of risk: moderate)  CC: Back Pain (lower, left is worse)  HPI  Jordan Gordon is a 84 y.o. year old, female patient, who comes today for a medication management evaluation. She has Anxiety; Mechanical and motor problems with internal organs; Adaptation reaction; Allergic rhinitis; Benign neoplasm of large bowel; Abnormal foot pulse; Clinical depression; Disorder resulting from impaired renal function; Essential (primary) hypertension; Fatigue; Blood glucose elevated; HLD (hyperlipidemia); Decreased potassium in the blood; Insomnia; Low back pain; Menopausal and perimenopausal disorder; Muscle ache; Osteoarthritis; Paresthesia of skin; Gastro-esophageal reflux disease with esophagitis; SOB (shortness of breath) on exertion; History of cardiac catheterization; and Chest pain on their problem list. Her primarily concern today is the Back Pain (lower, left is worse)  Pain Assessment: Location: Lower, Left Back Radiating: down left leg to bridge of foot Onset: More than a month ago Duration: Chronic pain Quality: Numbness Severity: 5 /10 (subjective, self-reported pain score)  Note: Reported level is inconsistent with clinical observations. Clinically the patient looks like a 2/10 A 2/10 is viewed as "Mild to Moderate" and described as noticeable and distracting. Impossible to hide from other people. More frequent flare-ups. Still possible to adapt and function close to normal. It can be very annoying and Kuenzi have occasional stronger flare-ups. With discipline, patients Val get used to it and  adapt.       When using our objective Pain Scale, levels between 6 and 10/10 are said to belong in an emergency room, as it progressively worsens from a 6/10, described as severely limiting, requiring emergency care not usually available at an outpatient pain management facility. At a 6/10 level, communication becomes difficult and requires great effort. Assistance to reach the emergency department Gaetano be required. Facial flushing and profuse sweating along with potentially dangerous increases in heart rate and blood pressure will be evident. Effect on ADL: difficult to stand long periods Timing:   Modifying factors: meds and rest BP: (!) 173/74  HR: 98  Ms. Jutras was last scheduled for an appointment on 12/28/2017 for medication management. During today's appointment we reviewed [name]'s chronic pain status, as well as her outpatient medication regimen.  Patient presents today for follow-up for her second patient visit.  Patient is endorsing significant benefit with gabapentin that is confirmed by her son.  She is currently taking gabapentin 100 mill grams 3 times a day.  She notes improvement in her left leg burning pain and also states that she is able to walk and perform activities of daily living with greater ease.  Patient had a lumbar MRI performed, results which are below.  The patient  reports that she does not use drugs. Her body mass index is 25.06 kg/m.  Further details on both, my assessment(s), as well as the proposed treatment plan, please see below.  Laboratory Chemistry  Inflammation Markers (CRP: Acute Phase) (ESR: Chronic Phase) No results found for: CRP, ESRSEDRATE, LATICACIDVEN                       Rheumatology Markers No results found for: RF, ANA, LABURIC, URICUR, LYMEIGGIGMAB, LYMEABIGMQN,  HLAB27                      Renal Function Markers Lab Results  Component Value Date   BUN 19 05/05/2017   CREATININE 0.98 (H) 05/05/2017   BCR 19 05/05/2017   GFRAA 63  05/05/2017   GFRNONAA 54 (L) 05/05/2017                             Hepatic Function Markers Lab Results  Component Value Date   AST 25 05/05/2017   ALT 24 05/05/2017   ALBUMIN 4.1 03/13/2016   ALKPHOS 63 03/13/2016                        Electrolytes Lab Results  Component Value Date   NA 141 05/05/2017   K 3.3 (L) 05/05/2017   CL 102 05/05/2017   CALCIUM 9.5 05/05/2017                        Neuropathy Markers Lab Results  Component Value Date   HGBA1C 5.6 11/22/2017                        Bone Pathology Markers No results found for: [place], [id number], [id number], [id number], 25OHVITD1, 25OHVITD2, 25OHVITD3, TESTOFREE, TESTOSTERONE                       Coagulation Parameters Lab Results  Component Value Date   PLT 201 05/05/2017                        Cardiovascular Markers Lab Results  Component Value Date   CKTOTAL 71 01/12/2013   CKMB 3.1 01/12/2013   TROPONINI < 0.02 06/02/2014   HGB 14.0 05/05/2017   HCT 39.5 05/05/2017                         CA Markers No results found for: CEA, CA125, LABCA2                      Note: Lab results reviewed.  Recent Diagnostic Imaging Results  MR LUMBAR SPINE WO CONTRAST CLINICAL DATA:  84 year old female with lumbar back pain radiating down the left leg the foot with numbness for 1 month.  EXAM: MRI LUMBAR SPINE WITHOUT CONTRAST  TECHNIQUE: Multiplanar, multisequence MR imaging of the lumbar spine was performed. No intravenous contrast was administered.  COMPARISON:  CT Abdomen and Pelvis 09/24/2005.  FINDINGS: Segmentation:  Normal on the comparison CT.  Alignment: Mild levoconvex lumbar scoliosis appears increased since 2007. Associated straightening of lumbar lordosis. Multilevel mild spondylolisthesis, including subtle retrolisthesis at L2-L3 and L5-S1, subtle anterolisthesis at L3-L4 and L4-L5.  Vertebrae: Degenerative appearing central and right lateral endplate marrow edema at L1-L2 (series  4, image 8). Underlying chronic endplate degeneration including chronic appearing Schmorl's nodes. There is faint degenerative endplate marrow edema at L2-L3. Background bone marrow signal is within normal limits. No other No acute osseous abnormality identified. Intact visible sacrum and SI joints.  Conus medullaris and cauda equina: Conus extends to the T12-L1 level. No lower spinal cord or conus signal abnormality.  Paraspinal and other soft tissues: Benign appearing left renal midpole cyst. Otherwise negative visible abdominal viscera. Negative visualized posterior paraspinal soft tissues.  Disc levels:  T11-T12: Small chronic Schmorl's nodes and disc space loss. Mild disc bulging. No significant stenosis.  T12-L1: Disc space loss with trace vacuum disc. Right eccentric circumferential disc bulge with small central disc protrusion(s). Mild facet hypertrophy. No significant stenosis.  L1-L2: Vacuum disc, disc space loss and right eccentric circumferential disc bulge and endplate spurring. Mild to moderate facet hypertrophy greater on the right. Mild spinal and right lateral recess stenosis (right L2 nerve level). No foraminal stenosis.  L2-L3: Disc space loss with vacuum disc. Circumferential disc osteophyte complex eccentric to the right. Moderate facet and ligament flavum hypertrophy greater on the right. Moderate spinal and right lateral recess stenosis (right L3 nerve level). Mild left and moderate right L2 neural foraminal stenosis.  L3-L4: Vacuum disc. Circumferential disc bulge and endplate spurring with broad-based posterior and foraminal components. Moderate facet and ligament flavum hypertrophy. Moderate to severe spinal stenosis (series 5, image 23) with moderate left and severe right lateral recess stenosis (L4 nerve levels). Mild left and moderate right L3 foraminal stenosis.  L4-L5: Disc space loss with vacuum disc. Left eccentric circumferential disc bulge  and endplate spurring with broad-based posterior and left subarticular component. Moderate to severe facet and moderate ligament flavum hypertrophy. Severe left lateral recess stenosis (left L5 nerve level) with moderate spinal stenosis. Mild right lateral recess stenosis. Moderate left L4 foraminal stenosis.  L5-S1: Mild far lateral disc bulging and endplate spurring. Mild to moderate facet hypertrophy greater on the left. No spinal or convincing lateral recess stenosis. Mild to moderate left L5 foraminal stenosis.  IMPRESSION: 1. Moderate to severe multifactorial spinal stenosis at L3-L4, but the symptomatic level with regard to left lower extremity pain and numbness Mcpherson be L4-L5 where there moderate spinal stenosis but severe multifactorial left lateral recess stenosis. Query left L5 radiculitis. Associated moderate left L4 neural foraminal stenosis also. 2. Lesser spinal stenosis with mostly right side lumbar neural impingement elsewhere. There is mild to moderate left L5 neural foraminal stenosis at L5-S1. 3. Degenerative endplate marrow edema at the L1-L2, and to a lesser extent, L2-L3 levels. Levoconvex lumbar scoliosis.  Electronically Signed   By: Genevie Ann M.D.   On: 01/03/2018 09:15  Complexity Note: Imaging results reviewed. Results shared with Ms. Sonntag, using Layman's terms. Today I personally and independently reviewed the study images pertinent to Ms. Deeney's problem.            I have personally examined the images and I agree with the reported  findings. I find no additional pain-related pathology to add to the report.  Meds   Current Outpatient Medications:  .  ALPRAZolam (XANAX) 0.5 MG tablet, TAKE 1 TABLET BY MOUTH 3 TIMES DAILY AS NEEDED FOR ANXIETY, Disp: 90 tablet, Rfl: 5 .  gabapentin (NEURONTIN) 100 MG capsule, 100 mg qday, 100 mg qafternoon, 300 mg qhs, Disp: 150 capsule, Rfl: 3 .  LUMIGAN 0.01 % SOLN, Place 1 drop into both eyes at bedtime. , Disp: , Rfl:  .   naproxen sodium (ALEVE) 220 MG tablet, Take 220 mg by mouth daily as needed., Disp: , Rfl:  .  potassium chloride SA (K-DUR,KLOR-CON) 20 MEQ tablet, TAKE ONE TABLET BY MOUTH EVERY DAY, Disp: 90 tablet, Rfl: 3 .  sertraline (ZOLOFT) 100 MG tablet, Take 2 tablets (200 mg total) by mouth daily., Disp: 180 tablet, Rfl: 1 .  traZODone (DESYREL) 100 MG tablet, TAKE ONE TABLET EVERY EVENING AT BEDTIME, Disp: 90 tablet, Rfl: 3 .  triamterene-hydrochlorothiazide (MAXZIDE-25) 37.5-25 MG tablet, TAKE ONE  TABLET BY MOUTH EVERY DAY, Disp: 90 tablet, Rfl: 3 .  Pseudoephedrine HCl (SINUS & ALLERGY 12 HOUR PO), Take by mouth., Disp: , Rfl:   ROS  Constitutional: Denies any fever or chills Gastrointestinal: No reported hemesis, hematochezia, vomiting, or acute GI distress Musculoskeletal: Denies any acute onset joint swelling, redness, loss of ROM, or weakness Neurological: No reported episodes of acute onset apraxia, aphasia, dysarthria, agnosia, amnesia, paralysis, loss of coordination, or loss of consciousness  Allergies  Ms. Haslam is allergic to codeine.  PFSH  Drug: Ms. Vanyo  reports that she does not use drugs. Alcohol:  reports that she drinks about 3.0 standard drinks of alcohol per week. Tobacco:  reports that she has never smoked. She has never used smokeless tobacco. Medical:  has a past medical history of Anxiety, Hyperlipidemia, Hypertension, and Major depressive disorder. Surgical: Ms. Talerico  has a past surgical history that includes Knee surgery (Left, 2011); Abdominal hysterectomy (1979); Tonsillectomy (1940); Colonoscopy with propofol (N/A, 07/22/2015); Colonoscopy with propofol (N/A, 10/19/2016); and Breast excisional biopsy (Left, 2005). Family: family history includes Cancer in her brother and mother; Heart disease in her father.  Constitutional Exam  General appearance: Well nourished, well developed, and well hydrated. In no apparent acute distress Vitals:   01/20/18 1318  BP: (!) 173/74    Pulse: 98  Resp: 16  Temp: 98.3 F (36.8 C)  TempSrc: Oral  SpO2: 99%  Weight: 160 lb (72.6 kg)  Height: _0  (1.702 m)   BMI Assessment: Estimated body mass index is 25.06 kg/m as calculated from the following:   Height as of this encounter: _1  (1.702 m).   Weight as of this encounter: 160 lb (72.6 kg).  BMI interpretation table: BMI level Category Range association with higher incidence of chronic pain  <18 kg/m2 Underweight   18.5-24.9 kg/m2 Ideal body weight   25-29.9 kg/m2 Overweight Increased incidence by 20%  30-34.9 kg/m2 Obese (Class I) Increased incidence by 68%  35-39.9 kg/m2 Severe obesity (Class II) Increased incidence by 136%  >40 kg/m2 Extreme obesity (Class III) Increased incidence by 254%   Patient's current BMI Ideal Body weight  Body mass index is 25.06 kg/m. Ideal body weight: 61.6 kg (135 lb 12.9 oz) Adjusted ideal body weight: 66 kg (145 lb 7.7 oz)   BMI Readings from Last 4 Encounters:  01/20/18 25.06 kg/m  12/28/17 25.06 kg/m  12/23/17 26.69 kg/m  11/22/17 27.22 kg/m   Wt Readings from Last 4 Encounters:  01/20/18 160 lb (72.6 kg)  12/28/17 160 lb (72.6 kg)  12/23/17 160 lb 6.4 oz (72.8 kg)  11/22/17 163 lb 9.6 oz (74.2 kg)  Psych/Mental status: Alert, oriented x 3 (person, place, & time)       Eyes: PERLA Respiratory: No evidence of acute respiratory distress  Cervical Spine Area Exam  Skin & Axial Inspection: No masses, redness, edema, swelling, or associated skin lesions Alignment: Symmetrical Functional ROM: Unrestricted ROM      Stability: No instability detected Muscle Tone/Strength: Functionally intact. No obvious neuro-muscular anomalies detected. Sensory (Neurological): Unimpaired Palpation: No palpable anomalies              Upper Extremity (UE) Exam    Side: Right upper extremity  Side: Left upper extremity  Skin & Extremity Inspection: Skin color, temperature, and hair growth are WNL. No peripheral edema or cyanosis.  No masses, redness, swelling, asymmetry, or associated skin lesions. No contractures.  Skin & Extremity Inspection: Skin color, temperature, and hair growth  are WNL. No peripheral edema or cyanosis. No masses, redness, swelling, asymmetry, or associated skin lesions. No contractures.  Functional ROM: Unrestricted ROM          Functional ROM: Unrestricted ROM          Muscle Tone/Strength: Functionally intact. No obvious neuro-muscular anomalies detected.  Muscle Tone/Strength: Functionally intact. No obvious neuro-muscular anomalies detected.  Sensory (Neurological): Unimpaired          Sensory (Neurological): Unimpaired          Palpation: No palpable anomalies              Palpation: No palpable anomalies              Provocative Test(s):  Phalen's test: deferred Tinel's test: deferred Apley's scratch test (touch opposite shoulder):  Action 1 (Across chest): deferred Action 2 (Overhead): deferred Action 3 (LB reach): deferred   Provocative Test(s):  Phalen's test: deferred Tinel's test: deferred Apley's scratch test (touch opposite shoulder):  Action 1 (Across chest): deferred Action 2 (Overhead): deferred Action 3 (LB reach): deferred    Thoracic Spine Area Exam  Skin & Axial Inspection: No masses, redness, or swelling Alignment: Symmetrical Functional ROM: Unrestricted ROM Stability: No instability detected Muscle Tone/Strength: Functionally intact. No obvious neuro-muscular anomalies detected. Sensory (Neurological): Unimpaired Muscle strength & Tone: No palpable anomalies  Lumbar Spine Area Exam  Skin & Axial Inspection: No masses, redness, or swelling Alignment: Symmetrical Functional ROM: Decreased ROM affecting primarily the left Stability: No instability detected Muscle Tone/Strength: Functionally intact. No obvious neuro-muscular anomalies detected. Sensory (Neurological): Unimpaired Palpation: No palpable anomalies       Provocative Tests: Lumbar  Hyperextension/rotation test: (+) bilaterally for facet joint pain. Lumbar quadrant test (Kemp's test): deferred today       Lumbar Lateral bending test: (+) ipsilateral radicular pain, on the left. Positive for left-sided foraminal stenosis. Patrick's Maneuver: deferred today                   FABER test: deferred today                   Thigh-thrust test: deferred today       S-I compression test: deferred today       S-I distraction test: deferred today         Gait & Posture Assessment  Ambulation: Unassisted Gait: Relatively normal for age and body habitus Posture: WNL   Lower Extremity Exam    Side: Right lower extremity  Side: Left lower extremity  Stability: No instability observed          Stability: No instability observed          Skin & Extremity Inspection: Skin color, temperature, and hair growth are WNL. No peripheral edema or cyanosis. No masses, redness, swelling, asymmetry, or associated skin lesions. No contractures.  Skin & Extremity Inspection: Skin color, temperature, and hair growth are WNL. No peripheral edema or cyanosis. No masses, redness, swelling, asymmetry, or associated skin lesions. No contractures.  Functional ROM: Unrestricted ROM                  Functional ROM: Unrestricted ROM                  Muscle Tone/Strength: Functionally intact. No obvious neuro-muscular anomalies detected.  Muscle Tone/Strength: Functionally intact. No obvious neuro-muscular anomalies detected.  Sensory (Neurological): Unimpaired  Sensory (Neurological): Unimpaired  Palpation: No palpable anomalies  Palpation: No palpable anomalies  Assessment  Primary Diagnosis & Pertinent Problem List: The primary encounter diagnosis was Lumbar radiculopathy. Diagnoses of Chronic left-sided low back pain with left-sided sciatica, Lumbar degenerative disc disease, and Chronic pain syndrome were also pertinent to this visit.  Status Diagnosis  Responding Responding Responding 1. Lumbar  radiculopathy   2. Chronic left-sided low back pain with left-sided sciatica   3. Lumbar degenerative disc disease   4. Chronic pain syndrome      General Recommendations: The pain condition that the patient suffers from is best treated with a multidisciplinary approach that involves an increase in physical activity to prevent de-conditioning and worsening of the pain cycle, as well as psychological counseling (formal and/or informal) to address the co-morbid psychological affects of pain. Treatment will often involve judicious use of pain medications and interventional procedures to decrease the pain, allowing the patient to participate in the physical activity that will ultimately produce long-lasting pain reductions. The goal of the multidisciplinary approach is to return the patient to a higher level of overall function and to restore their ability to perform activities of daily living.  Patient presents today for follow-up for her second patient visit.  Patient is endorsing significant benefit with gabapentin that is confirmed by her son.  She is currently taking gabapentin 100 mill grams 3 times a day.  Denies any side effects of lower extreme the swelling, sedation, vivid dreams she notes improvement in her left leg burning pain and also states that she is able to walk and perform activities of daily living with greater ease.  This is confirmed by her son as well  MRI of her lumbar spine reveals moderate to severe multifactorial spinal stenosis at L3-L4 along with L4-L5 with possible left L5 radiculitis along with left L4 neuroforaminal stenosis.  Patient also has mild to moderate left L5 neuroforaminal stenosis at L5-S1.  These findings were discussed with the patient in detail.  Given that the patient is receiving benefit with gabapentin, I recommended increasing her nighttime dose to 300 mg nightly and continue the morning and afternoon dose of 100 mg respectively.  I did discuss lumbar epidural  steroid injection for her lumbar radicular symptoms.  We will place an as needed order for this if the patient has worsening pain would like to have an injection done.  Risks and benefits discussed.  Plan: -Continue gabapentin at 100 mg in the morning, 100 mill grams in the afternoon and increase the nighttime dose to 300 mg nightly. -PRN lumbar epidural steroid injection for lumbar radiculopathy.  Future considerations: Increase gabapentin, Lyrica, Cymbalta, alpha lipoic acid  Plan of Care  Pharmacotherapy (Medications Ordered): Meds ordered this encounter  Medications  . gabapentin (NEURONTIN) 100 MG capsule    Sig: 100 mg qday, 100 mg qafternoon, 300 mg qhs    Dispense:  150 capsule    Refill:  3    Do not place this medication, or any other prescription from our practice, on "Automatic Refill". Patient Derrig have prescription filled one day early if pharmacy is closed on scheduled refill date.   Lab-work, procedure(s), and/or referral(s): Orders Placed This Encounter  Procedures  . Lumbar Epidural Injection     PRN Procedures:   Lumbar ESI   Provider-requested follow-up: Return in about 8 weeks (around 03/17/2018) for Medication Management.  Time Note: Greater than 50% of the 25 minute(s) of face-to-face time spent with Ms. Schlatter, was spent in counseling/coordination of care regarding: Ms. Delancey primary cause of pain, the treatment plan, treatment  alternatives, the risks and possible complications of proposed treatment, medication side effects, the appropriate use of her medications, realistic expectations and the goals of pain management (increased in functionality).  Future Appointments  Date Time Provider [place]  05/02/2018  9:20 AM [facility]-NURSE HEALTH ADVISOR [facility] None  05/02/2018 10:00 AM [place], Clearnce Sorrel, PA-C [facility] None    Primary Care Physician: Rubye Beach Location: [facility] Note by: Gillis Santa,  M.D Date: 01/20/2018; Time: 1:53 PM  Patient Instructions   Gabapentin has been escribed to your pharmacy. GENERAL RISKS AND COMPLICATIONS  What are the risk, side effects and possible complications? Generally speaking, most procedures are safe.  However, with any procedure there are risks, side effects, and the possibility of complications.  The risks and complications are dependent upon the sites that are lesioned, or the type of nerve block to be performed.  The closer the procedure is to the spine, the more serious the risks are.  Great care is taken when placing the radio frequency needles, block needles or lesioning probes, but sometimes complications can occur. 1. Infection: Any time there is an injection through the skin, there is a risk of infection.  This is why sterile conditions are used for these blocks.  There are four possible types of infection. 1. Localized skin infection. 2. Central Nervous System Infection-This can be in the form of Meningitis, which can be deadly. 3. Epidural Infections-This can be in the form of an epidural abscess, which can cause pressure inside of the spine, causing compression of the spinal cord with subsequent paralysis. This would require an emergency surgery to decompress, and there are no guarantees that the patient would recover from the paralysis. 4. Discitis-This is an infection of the intervertebral discs.  It occurs in about 1% of discography procedures.  It is difficult to treat and it Elman lead to surgery.        2. Pain: the needles have to go through skin and soft tissues, will cause soreness.       3. Damage to internal structures:  The nerves to be lesioned Bartell be near blood vessels or    other nerves which can be potentially damaged.       4. Bleeding: Bleeding is more common if the patient is taking blood thinners such as  aspirin, Coumadin, Ticiid, Plavix, etc., or if he/she have some genetic predisposition  such as hemophilia. Bleeding  into the spinal canal can cause compression of the spinal  cord with subsequent paralysis.  This would require an emergency surgery to  decompress and there are no guarantees that the patient would recover from the  paralysis.       5. Pneumothorax:  Puncturing of a lung is a possibility, every time a needle is introduced in  the area of the chest or upper back.  Pneumothorax refers to free air around the  collapsed lung(s), inside of the thoracic cavity (chest cavity).  Another two possible  complications related to a similar event would include: Hemothorax and Chylothorax.   These are variations of the Pneumothorax, where instead of air around the collapsed  lung(s), you [place]horne have blood or chyle, respectively.       6. Spinal headaches: They Kaczynski occur with any procedures in the area of the spine.       7. Persistent CSF (Cerebro-Spinal Fluid) leakage: This is a rare problem, but Donnan occur  with prolonged intrathecal or epidural catheters either due  to the formation of a fistulous  track or a dural tear.       8. Nerve damage: By working so close to the spinal cord, there is always a possibility of  nerve damage, which could be as serious as a permanent spinal cord injury with  paralysis.       9. Death:  Although rare, severe deadly allergic reactions known as "Anaphylactic  reaction" can occur to any of the medications used.      10. Worsening of the symptoms:  We can always make thing worse.  What are the chances of something like this happening? Chances of any of this occuring are extremely low.  By statistics, you have more of a chance of getting killed in a motor vehicle accident: while driving to the hospital than any of the above occurring .  Nevertheless, you should be aware that they are possibilities.  In general, it is similar to taking a shower.  Everybody knows that you can slip, hit your head and get killed.  Does that mean that you should not shower again?  Nevertheless always keep in mind  that statistics do not mean anything if you happen to be on the wrong side of them.  Even if a procedure has a 1 (one) in a 1,000,000 (million) chance of going wrong, it you happen to be that one..Also, keep in mind that by statistics, you have more of a chance of having something go wrong when taking medications.  Who should not have this procedure? If you are on a blood thinning medication (e.g. Coumadin, Plavix, see list of "Blood Thinners"), or if you have an active infection going on, you should not have the procedure.  If you are taking any blood thinners, please inform your physician.  How should I prepare for this procedure?  Do not eat or drink anything at least six hours prior to the procedure.  Bring a driver with you .  It cannot be a taxi.  Come accompanied by an adult that can drive you back, and that is strong enough to help you if your legs get weak or numb from the local anesthetic.  Take all of your medicines the morning of the procedure with just enough water to swallow them.  If you have diabetes, make sure that you are scheduled to have your procedure done first thing in the morning, whenever possible.  If you have diabetes, take only half of your insulin dose and notify our nurse that you have done so as soon as you arrive at the clinic.  If you are diabetic, but only take blood sugar pills (oral hypoglycemic), then do not take them on the morning of your procedure.  You Patron take them after you have had the procedure.  Do not take aspirin or any aspirin-containing medications, at least eleven (11) days prior to the procedure.  They Potvin prolong bleeding.  Wear loose fitting clothing that Darin be easy to take off and that you would not mind if it got stained with Betadine or blood.  Do not wear any jewelry or perfume  Remove any nail coloring.  It will interfere with some of our monitoring equipment.  NOTE: Remember that this is not meant to be interpreted as a  complete list of all possible complications.  Unforeseen problems Delahoussaye occur.  BLOOD THINNERS The following drugs contain aspirin or other products, which can cause increased bleeding during surgery and should not be taken for 2 weeks  prior to and 1 week after surgery.  If you should need take something for relief of minor pain, you Buzan take acetaminophen which is found in Tylenol,m Datril, Anacin-3 and Panadol. It is not blood thinner. The products listed below are.  Do not take any of the products listed below in addition to any listed on your instruction sheet.  A.P.C or A.P.C with Codeine Codeine Phosphate Capsules #3 Ibuprofen Ridaura  ABC compound Congesprin Imuran rimadil  Advil Cope Indocin Robaxisal  Alka-Seltzer Effervescent Pain Reliever and Antacid Coricidin or Coricidin-D  Indomethacin Rufen  Alka-Seltzer plus Cold Medicine Cosprin Ketoprofen S-A-C Tablets  Anacin Analgesic Tablets or Capsules Coumadin Korlgesic Salflex  Anacin Extra Strength Analgesic tablets or capsules CP-2 Tablets Lanoril Salicylate  Anaprox Cuprimine Capsules Levenox Salocol  Anexsia-D Dalteparin Magan Salsalate  Anodynos Darvon compound Magnesium Salicylate Sine-off  Ansaid Dasin Capsules Magsal Sodium Salicylate  Anturane Depen Capsules Marnal Soma  APF Arthritis pain formula Dewitt's Pills Measurin Stanback  Argesic Dia-Gesic Meclofenamic Sulfinpyrazone  Arthritis Bayer Timed Release Aspirin Diclofenac Meclomen Sulindac  Arthritis pain formula Anacin Dicumarol Medipren Supac  Analgesic (Safety coated) Arthralgen Diffunasal Mefanamic Suprofen  Arthritis Strength Bufferin Dihydrocodeine Mepro Compound Suprol  Arthropan liquid Dopirydamole Methcarbomol with Aspirin Synalgos  ASA tablets/Enseals Disalcid Micrainin Tagament  Ascriptin Doan's Midol Talwin  Ascriptin A/D Dolene Mobidin Tanderil  Ascriptin Extra Strength Dolobid Moblgesic Ticlid  Ascriptin with Codeine Doloprin or Doloprin with Codeine  Momentum Tolectin  Asperbuf Duoprin Mono-gesic Trendar  Aspergum Duradyne Motrin or Motrin IB Triminicin  Aspirin plain, buffered or enteric coated Durasal Myochrisine Trigesic  Aspirin Suppositories Easprin Nalfon Trillsate  Aspirin with Codeine Ecotrin Regular or Extra Strength Naprosyn Uracel  Atromid-S Efficin Naproxen Ursinus  Auranofin Capsules Elmiron Neocylate Vanquish  Axotal Emagrin Norgesic Verin  Azathioprine Empirin or Empirin with Codeine Normiflo Vitamin E  Azolid Emprazil Nuprin Voltaren  Bayer Aspirin plain, buffered or children's or timed BC Tablets or powders Encaprin Orgaran Warfarin Sodium  Buff-a-Comp Enoxaparin Orudis Zorpin  Buff-a-Comp with Codeine Equegesic Os-Cal-Gesic   Buffaprin Excedrin plain, buffered or Extra Strength Oxalid   Bufferin Arthritis Strength Feldene Oxphenbutazone   Bufferin plain or Extra Strength Feldene Capsules Oxycodone with Aspirin   Bufferin with Codeine Fenoprofen Fenoprofen Pabalate or Pabalate-SF   Buffets II Flogesic Panagesic   Buffinol plain or Extra Strength Florinal or Florinal with Codeine Panwarfarin   Buf-Tabs Flurbiprofen Penicillamine   Butalbital Compound Four-way cold tablets Penicillin   Butazolidin Fragmin Pepto-Bismol   Carbenicillin Geminisyn Percodan   Carna Arthritis Reliever Geopen Persantine   Carprofen Gold's salt Persistin   Chloramphenicol Goody's Phenylbutazone   Chloromycetin Haltrain Piroxlcam   Clmetidine heparin Plaquenil   Cllnoril Hyco-pap Ponstel   Clofibrate Hydroxy chloroquine Propoxyphen         Before stopping any of these medications, be sure to consult the physician who ordered them.  Some, such as Coumadin (Warfarin) are ordered to prevent or treat serious conditions such as "deep thrombosis", "pumonary embolisms", and other heart problems.  The amount of time that you Burgener need off of the medication Duris also vary with the medication and the reason for which you were taking it.  If you are  taking any of these medications, please make sure you notify your pain physician before you undergo any procedures.          Moderate Conscious Sedation, Adult Sedation is the use of medicines to promote relaxation and relieve discomfort and anxiety. Moderate conscious sedation is a type of  sedation. Under moderate conscious sedation, you are less alert than normal, but you are still able to respond to instructions, touch, or both. Moderate conscious sedation is used during short medical and dental procedures. It is milder than deep sedation, which is a type of sedation under which you cannot be easily woken up. It is also milder than general anesthesia, which is the use of medicines to make you unconscious. Moderate conscious sedation allows you to return to your regular activities sooner. Tell a health care provider about:  Any allergies you have.  All medicines you are taking, including vitamins, herbs, eye drops, creams, and over-the-counter medicines.  Use of steroids (by mouth or creams).  Any problems you or family members have had with sedatives and anesthetic medicines.  Any blood disorders you have.  Any surgeries you have had.  Any medical conditions you have, such as sleep apnea.  Whether you are pregnant or Killman be pregnant.  Any use of cigarettes, alcohol, marijuana, or street drugs. What are the risks? Generally, this is a safe procedure. However, problems Pines occur, including:  Getting too much medicine (oversedation).  Nausea.  Allergic reaction to medicines.  Trouble breathing. If this happens, a breathing tube Villela be used to help with breathing. It will be removed when you are awake and breathing on your own.  Heart trouble.  Lung trouble.  What happens before the procedure? Staying hydrated Follow instructions from your health care provider about hydration, which Stoy include:  Up to 2 hours before the procedure - you Kabel continue to drink clear  liquids, such as water, clear fruit juice, black coffee, and plain tea.  Eating and drinking restrictions Follow instructions from your health care provider about eating and drinking, which Bivens include:  8 hours before the procedure - stop eating heavy meals or foods such as meat, fried foods, or fatty foods.  6 hours before the procedure - stop eating light meals or foods, such as toast or cereal.  6 hours before the procedure - stop drinking milk or drinks that contain milk.  2 hours before the procedure - stop drinking clear liquids.  Medicine  Ask your health care provider about:  Changing or stopping your regular medicines. This is especially important if you are taking diabetes medicines or blood thinners.  Taking medicines such as aspirin and ibuprofen. These medicines can thin your blood. Do not take these medicines before your procedure if your health care provider instructs you not to.  Tests and exams  You will have a physical exam.  You Freid have blood tests done to show: ? How well your kidneys and liver are working. ? How well your blood can clot. General instructions  Plan to have someone take you home from the hospital or clinic.  If you will be going home right after the procedure, plan to have someone with you for 24 hours. What happens during the procedure?  An IV tube will be inserted into one of your veins.  Medicine to help you relax (sedative) will be given through the IV tube.  The medical or dental procedure will be performed. What happens after the procedure?  Your blood pressure, heart rate, breathing rate, and blood oxygen level will be monitored often until the medicines you were given have worn off.  Do not drive for 24 hours. This information is not intended to replace advice given to you by your health care provider. Make sure you discuss any questions you have with  your health care provider. Document Released: 02/17/2001 Document Revised:  10/29/2015 Document Reviewed: 09/14/2015 Elsevier Interactive Patient Education  2018 [place]. Epidural Steroid Injection Patient Information  Description: The epidural space surrounds the nerves as they exit the spinal cord.  In some patients, the nerves can be compressed and inflamed by a bulging disc or a tight spinal canal (spinal stenosis).  By injecting steroids into the epidural space, we can bring irritated nerves into direct contact with a potentially helpful medication.  These steroids act directly on the irritated nerves and can reduce swelling and inflammation which often leads to decreased pain.  Epidural steroids Costabile be injected anywhere along the spine and from the neck to the low back depending upon the location of your pain.   After numbing the skin with local anesthetic (like Novocaine), a small needle is passed into the epidural space slowly.  You Kroh experience a sensation of pressure while this is being done.  The entire block usually last less than 10 minutes.  Conditions which Stege be treated by epidural steroids:   Low back and leg pain  Neck and arm pain  Spinal stenosis  Post-laminectomy syndrome  Herpes zoster (shingles) pain  Pain from compression fractures  Preparation for the injection:  1. Do not eat any solid food or dairy products within 8 hours of your appointment.  2. You Chauca drink clear liquids up to 3 hours before appointment.  Clear liquids include water, black coffee, juice or soda.  No milk or cream please. 3. You Elmendorf take your regular medication, including pain medications, with a sip of water before your appointment  Diabetics should hold regular insulin (if taken separately) and take 1/2 normal NPH dos the morning of the procedure.  Carry some sugar containing items with you to your appointment. 4. A driver must accompany you and be prepared to drive you home after your procedure.  5. Bring all your current medications with your. 6. An IV  Theard be inserted and sedation Lamica be given at the discretion of the physician.   7. A blood pressure cuff, EKG and other monitors will often be applied during the procedure.  Some patients Markwardt need to have extra oxygen administered for a short period. 8. You will be asked to provide medical information, including your allergies, prior to the procedure.  We must know immediately if you are taking blood thinners (like Coumadin/Warfarin)  Or if you are allergic to IV iodine contrast (dye). We must know if you could possible be pregnant.  Possible side-effects:  Bleeding from needle site  Infection (rare, Venne require surgery)  Nerve injury (rare)  Numbness & tingling (temporary)  Difficulty urinating (rare, temporary)  Spinal headache ( a headache worse with upright posture)  Light -headedness (temporary)  Pain at injection site (several days)  Decreased blood pressure (temporary)  Weakness in arm/leg (temporary)  Pressure sensation in back/neck (temporary)  Call if you experience:  Fever/chills associated with headache or increased back/neck pain.  Headache worsened by an upright position.  New onset weakness or numbness of an extremity below the injection site  Hives or difficulty breathing (go to the emergency room)  Inflammation or drainage at the infection site  Severe back/neck pain  Any new symptoms which are concerning to you  Please note:  Although the local anesthetic injected can often make your back or neck feel good for several hours after the injection, the pain will likely return.  It takes 3-7 days for  steroids to work in the epidural space.  You Ramus not notice any pain relief for at least that one week.  If effective, we will often do a series of three injections spaced 3-6 weeks apart to maximally decrease your pain.  After the initial series, we generally will wait several months before considering a repeat injection of the same type.  If you have any  questions, please call [phone] [facility]

## 2018-01-26 DIAGNOSIS — H2512 Age-related nuclear cataract, left eye: Secondary | ICD-10-CM | POA: Diagnosis not present

## 2018-02-02 ENCOUNTER — Encounter: Payer: Self-pay | Admitting: *Deleted

## 2018-02-08 ENCOUNTER — Ambulatory Visit: Payer: Medicare Other | Admitting: Certified Registered"

## 2018-02-08 ENCOUNTER — Ambulatory Visit
Admission: RE | Admit: 2018-02-08 | Discharge: 2018-02-08 | Disposition: A | Discharge status: Home / Self Care | Payer: Medicare Other | Source: Ambulatory Visit | Attending: Ophthalmology | Admitting: Ophthalmology

## 2018-02-08 ENCOUNTER — Encounter: Payer: Self-pay | Admitting: *Deleted

## 2018-02-08 ENCOUNTER — Other Ambulatory Visit: Payer: Self-pay

## 2018-02-08 ENCOUNTER — Encounter
Admission: RE | Disposition: A | Discharge status: Home / Self Care | Payer: Self-pay | Source: Ambulatory Visit | Attending: Ophthalmology

## 2018-02-08 DIAGNOSIS — F419 Anxiety disorder, unspecified: Secondary | ICD-10-CM | POA: Insufficient documentation

## 2018-02-08 DIAGNOSIS — H2512 Age-related nuclear cataract, left eye: Secondary | ICD-10-CM | POA: Diagnosis not present

## 2018-02-08 DIAGNOSIS — I10 Essential (primary) hypertension: Secondary | ICD-10-CM | POA: Diagnosis not present

## 2018-02-08 DIAGNOSIS — K219 Gastro-esophageal reflux disease without esophagitis: Secondary | ICD-10-CM | POA: Insufficient documentation

## 2018-02-08 DIAGNOSIS — I2510 Atherosclerotic heart disease of native coronary artery without angina pectoris: Secondary | ICD-10-CM | POA: Insufficient documentation

## 2018-02-08 DIAGNOSIS — Z955 Presence of coronary angioplasty implant and graft: Secondary | ICD-10-CM | POA: Diagnosis not present

## 2018-02-08 DIAGNOSIS — Z79899 Other long term (current) drug therapy: Secondary | ICD-10-CM | POA: Diagnosis not present

## 2018-02-08 HISTORY — PX: CATARACT EXTRACTION W/PHACO: SHX586

## 2018-02-08 HISTORY — DX: Unspecified hearing loss, unspecified ear: H91.90

## 2018-02-08 HISTORY — DX: Gastro-esophageal reflux disease without esophagitis: K21.9

## 2018-02-08 HISTORY — DX: Atherosclerotic heart disease of native coronary artery without angina pectoris: I25.10

## 2018-02-08 SURGERY — PHACOEMULSIFICATION, CATARACT, WITH IOL INSERTION
Anesthesia: Monitor Anesthesia Care | Site: Eye | Laterality: Left | Wound class: Clean

## 2018-02-08 MED ORDER — LIDOCAINE HCL (PF) 4 % IJ SOLN
INTRAOCULAR | Status: DC | PRN
  Administered 2018-02-08: 4 mL via OPHTHALMIC

## 2018-02-08 MED ORDER — POVIDONE-IODINE 5 % OP SOLN
OPHTHALMIC | Status: AC
  Filled 2018-02-08: qty 30

## 2018-02-08 MED ORDER — LIDOCAINE HCL (PF) 4 % IJ SOLN
INTRAMUSCULAR | Status: AC
  Filled 2018-02-08: qty 5

## 2018-02-08 MED ORDER — MOXIFLOXACIN HCL 0.5 % OP SOLN
OPHTHALMIC | Status: DC | PRN
  Administered 2018-02-08: 0.2 mL via OPHTHALMIC

## 2018-02-08 MED ORDER — EPINEPHRINE PF 1 MG/ML IJ SOLN
INTRAOCULAR | Status: DC | PRN
  Administered 2018-02-08: 09:00:00 via OPHTHALMIC

## 2018-02-08 MED ORDER — EPINEPHRINE PF 1 MG/ML IJ SOLN
INTRAMUSCULAR | Status: AC
  Filled 2018-02-08: qty 2

## 2018-02-08 MED ORDER — NA CHONDROIT SULF-NA HYALURON 40-17 MG/ML IO SOLN
INTRAOCULAR | Status: AC
  Filled 2018-02-08: qty 1

## 2018-02-08 MED ORDER — ONDANSETRON HCL 4 MG/2ML IJ SOLN
INTRAMUSCULAR | Status: DC | PRN
  Administered 2018-02-08: 4 mg via INTRAVENOUS

## 2018-02-08 MED ORDER — FENTANYL CITRATE (PF) 100 MCG/2ML IJ SOLN
INTRAMUSCULAR | Status: AC
  Filled 2018-02-08: qty 2

## 2018-02-08 MED ORDER — MIDAZOLAM HCL 2 MG/2ML IJ SOLN
INTRAMUSCULAR | Status: AC
  Filled 2018-02-08: qty 2

## 2018-02-08 MED ORDER — NA CHONDROIT SULF-NA HYALURON 40-17 MG/ML IO SOLN
INTRAOCULAR | Status: DC | PRN
  Administered 2018-02-08: 1 mL via INTRAOCULAR

## 2018-02-08 MED ORDER — MIDAZOLAM HCL 2 MG/2ML IJ SOLN
INTRAMUSCULAR | Status: DC | PRN
  Administered 2018-02-08 (×2): 1 mg via INTRAVENOUS

## 2018-02-08 MED ORDER — FENTANYL CITRATE (PF) 100 MCG/2ML IJ SOLN
INTRAMUSCULAR | Status: DC | PRN
  Administered 2018-02-08 (×2): 50 ug via INTRAVENOUS

## 2018-02-08 MED ORDER — CARBACHOL 0.01 % IO SOLN
INTRAOCULAR | Status: DC | PRN
  Administered 2018-02-08: 0.5 mL via INTRAOCULAR

## 2018-02-08 MED ORDER — MOXIFLOXACIN HCL 0.5 % OP SOLN: 1.0000 [drp] | OPHTHALMIC | Status: DC | PRN

## 2018-02-08 MED ORDER — POVIDONE-IODINE 5 % OP SOLN
OPHTHALMIC | Status: DC | PRN
  Administered 2018-02-08: 1 via OPHTHALMIC

## 2018-02-08 MED ORDER — SODIUM CHLORIDE 0.9 % IV SOLN
INTRAVENOUS | Status: DC
  Administered 2018-02-08: 08:00:00 via INTRAVENOUS

## 2018-02-08 MED ORDER — ARMC OPHTHALMIC DILATING DROPS
1.0000 "application " | OPHTHALMIC | Status: AC
  Administered 2018-02-08 (×3): 1 via OPHTHALMIC

## 2018-02-08 SURGICAL SUPPLY — 16 items
GLOVE BIO SURGEON STRL SZ8 (GLOVE) ×3 IMPLANT
GLOVE BIOGEL M 6.5 STRL (GLOVE) ×3 IMPLANT
GLOVE SURG LX 8.0 MICRO (GLOVE) ×2
GLOVE SURG LX STRL 8.0 MICRO (GLOVE) ×1 IMPLANT
GOWN STRL REUS W/ TWL LRG LVL3 (GOWN DISPOSABLE) ×2 IMPLANT
GOWN STRL REUS W/TWL LRG LVL3 (GOWN DISPOSABLE) ×4
LABEL CATARACT MEDS ST (LABEL) ×3 IMPLANT
LENS IOL TECNIS ITEC 17.5 (Intraocular Lens) ×3 IMPLANT
PACK CATARACT (MISCELLANEOUS) ×3 IMPLANT
PACK CATARACT BRASINGTON LX (MISCELLANEOUS) ×3 IMPLANT
PACK EYE AFTER SURG (MISCELLANEOUS) ×3 IMPLANT
SOL BSS BAG (MISCELLANEOUS) ×3
SOLUTION BSS BAG (MISCELLANEOUS) ×1 IMPLANT
SYR 5ML LL (SYRINGE) ×3 IMPLANT
WATER STERILE IRR 250ML POUR (IV SOLUTION) ×3 IMPLANT
WIPE NON LINTING 3.25X3.25 (MISCELLANEOUS) ×3 IMPLANT

## 2018-02-08 NOTE — Discharge Instructions (Addendum)
Eye Surgery Discharge Instructions  Expect mild scratchy sensation or mild soreness. DO NOT RUB YOUR EYE!  The day of surgery:  Minimal physical activity, but bed rest is not required  No reading, computer work, or close hand work  No bending, lifting, or straining.  Varricchio watch TV  For 24 hours:  No driving, legal decisions, or alcoholic beverages  Safety precautions  Eat anything you prefer: It is better to start with liquids, then soup then solid foods.  Solar shield eyeglasses should be worn for comfort in the sunlight/patch while sleeping  Resume all regular medications including aspirin or Coumadin if these were discontinued prior to surgery. You Yu shower, bathe, shave, or wash your hair. Tylenol Ewer be taken for mild discomfort. FOLLOW DR. PORFILIO'S EYE DROP INSTRUCTION SHEET AS REVIEWED.  Call your doctor if you experience significant pain, nausea, or vomiting, fever > 101 or other signs of infection. [phone] or [phone] Specific instructions:  Follow-up Information    Birder Robson, MD Follow up.   Specialty:  Ophthalmology Why:  02/09/18 @ 11:00 AM Contact information: [address] [place] [address] [phone]

## 2018-02-08 NOTE — Transfer of Care (Signed)
Immediate Anesthesia Transfer of Care Note  Patient: Jordan Gordon  Procedure(s) Performed: CATARACT EXTRACTION PHACO AND INTRAOCULAR LENS PLACEMENT (IOC) (Left Eye)  Patient Location: PACU  Anesthesia Type:MAC  Level of Consciousness: awake, alert  and oriented  Airway & Oxygen Therapy: Patient Spontanous Breathing  Post-op Assessment: Report given to RN and Post -op Vital signs reviewed and stable  Post vital signs: Reviewed and stable  Last Vitals:  Vitals Value Taken Time  BP    Temp    Pulse    Resp    SpO2      Last Pain:  Vitals:   02/08/18 0736  TempSrc: Oral  PainSc: 3          Complications: No apparent anesthesia complications

## 2018-02-08 NOTE — Op Note (Signed)
PREOPERATIVE DIAGNOSIS:  Nuclear sclerotic cataract of the left eye.   POSTOPERATIVE DIAGNOSIS:  Nuclear sclerotic cataract of the left eye.   OPERATIVE PROCEDURE: Procedure(s): CATARACT EXTRACTION PHACO AND INTRAOCULAR LENS PLACEMENT (IOC)   SURGEON:  Birder Robson, MD.   ANESTHESIA:  Anesthesiologist: Durenda Hurt, MD CRNA: Disser, Einar Grad, CRNA  1.      Managed anesthesia care. 2.     0.22ml of Shugarcaine was instilled following the paracentesis   COMPLICATIONS:  None.   TECHNIQUE:   Stop and chop   DESCRIPTION OF PROCEDURE:  The patient was examined and consented in the preoperative holding area where the aforementioned topical anesthesia was applied to the left eye and then brought back to the Operating Room where the left eye was prepped and draped in the usual sterile ophthalmic fashion and a lid speculum was placed. A paracentesis was created with the side port blade and the anterior chamber was filled with viscoelastic. A near clear corneal incision was performed with the steel keratome. A continuous curvilinear capsulorrhexis was performed with a cystotome followed by the capsulorrhexis forceps. Hydrodissection and hydrodelineation were carried out with BSS on a blunt cannula. The lens was removed in a stop and chop  technique and the remaining cortical material was removed with the irrigation-aspiration handpiece. The capsular bag was inflated with viscoelastic and the Technis [id number] lens was placed in the capsular bag without complication. The remaining viscoelastic was removed from the eye with the irrigation-aspiration handpiece. The wounds were hydrated. The anterior chamber was flushed with Miostat and the eye was inflated to physiologic pressure. 0.78ml Vigamox was placed in the anterior chamber. The wounds were found to be water tight. The eye was dressed with Vigamox. The patient was given protective glasses to wear throughout the day and a shield with which to  sleep tonight. The patient was also given drops with which to begin a drop regimen today and will follow-up with me in one day. Implant Name Type Inv. Item Serial No. Manufacturer Lot No. LRB No. Used  LENS IOL DIOP 17.5 - [id number] Intraocular Lens LENS IOL DIOP 17.5 [id number]  Left 1    Procedure(s) with comments: CATARACT EXTRACTION PHACO AND INTRAOCULAR LENS PLACEMENT (IOC) (Left) - [place] 00:33.4 AP% 13.9 CDE 4.66 Fluid Pack lot # [id number]  Electronically signed: Birder Robson 02/08/2018 9:14 AM

## 2018-02-08 NOTE — Anesthesia Postprocedure Evaluation (Signed)
Anesthesia Post Note  Patient: Jordan Gordon  Procedure(s) Performed: CATARACT EXTRACTION PHACO AND INTRAOCULAR LENS PLACEMENT ([place]) (Left Eye)  Patient location during evaluation: PACU Anesthesia Type: MAC Level of consciousness: awake and alert Pain management: pain level controlled Vital Signs Assessment: post-procedure vital signs reviewed and stable Respiratory status: spontaneous breathing, nonlabored ventilation and respiratory function stable Cardiovascular status: stable and blood pressure returned to baseline Postop Assessment: no apparent nausea or vomiting Anesthetic complications: no     Last Vitals:  Vitals:   02/08/18 0736 02/08/18 0915  BP: (!) 162/76 (!) 144/69  Pulse: 86 84  Resp: 16 16  Temp: 36.4 C 36.8 C  SpO2: 99% 93%    Last Pain:  Vitals:   02/08/18 0915  TempSrc: Other (Comment)  PainSc: 0-No pain                 Durenda Hurt

## 2018-02-08 NOTE — H&P (Signed)
All labs reviewed. Abnormal studies sent to patients PCP when indicated.  Previous H&P reviewed, patient examined, there are NO CHANGES.  Jordan Bianchini Porfilio9/3/20198:51 AM

## 2018-02-08 NOTE — Anesthesia Preprocedure Evaluation (Signed)
Anesthesia Evaluation  Patient identified by MRN, date of birth, ID band Patient awake    Reviewed: Allergy & Precautions, H&P , NPO status , Patient's Chart, lab work & pertinent test results  History of Anesthesia Complications Negative for: history of anesthetic complications  Airway Mallampati: III  TM Distance: >3 FB Neck ROM: full    Dental  (+) Poor Dentition   Pulmonary neg pulmonary ROS, neg COPD,           Cardiovascular hypertension, + CAD  (-) Past MI, (-) Cardiac Stents and (-) CABG negative cardio ROS       Neuro/Psych negative neurological ROS  negative psych ROS   GI/Hepatic negative GI ROS, Neg liver ROS, GERD  ,  Endo/Other  negative endocrine ROS  Renal/GU Renal disease     Musculoskeletal   Abdominal   Peds  Hematology negative hematology ROS (+)   Anesthesia Other Findings Past Medical History: No date: Anxiety No date: Coronary artery disease No date: GERD (gastroesophageal reflux disease) No date: HOH (hard of hearing) No date: Hyperlipidemia No date: Hypertension No date: Major depressive disorder  Past Surgical History: 1979: ABDOMINAL HYSTERECTOMY 2005: BREAST EXCISIONAL BIOPSY; Left     Comment:  benign 07/22/2015: COLONOSCOPY WITH PROPOFOL; N/A     Comment:  Procedure: COLONOSCOPY WITH PROPOFOL;  Surgeon: Manya Silvas, MD;  Location: [facility] ENDOSCOPY;  Service:               Endoscopy;  Laterality: N/A; 10/19/2016: COLONOSCOPY WITH PROPOFOL; N/A     Comment:  Procedure: COLONOSCOPY WITH PROPOFOL;  Surgeon: Manya Silvas, MD;  Location: [facility] ENDOSCOPY;  Service:               Endoscopy;  Laterality: N/A; No date: JOINT REPLACEMENT     Comment:  TKR 2011: KNEE SURGERY; Left     Comment:  2007 1940: TONSILLECTOMY     Comment:  ADENOIDS  BMI    Body Mass Index:  25.06 kg/m      Reproductive/Obstetrics negative OB ROS                              Anesthesia Physical Anesthesia Plan  ASA: II  Anesthesia Plan: MAC   Post-op Pain Management:    Induction:   PONV Risk Score and Plan:   Airway Management Planned:   Additional Equipment:   Intra-op Plan:   Post-operative Plan:   Informed Consent: I have reviewed the patients History and Physical, chart, labs and discussed the procedure including the risks, benefits and alternatives for the proposed anesthesia with the patient or authorized representative who has indicated his/her understanding and acceptance.     Plan Discussed with: Anesthesiologist, CRNA and Surgeon  Anesthesia Plan Comments:         Anesthesia Quick Evaluation

## 2018-02-08 NOTE — Anesthesia Post-op Follow-up Note (Signed)
Anesthesia QCDR form completed.        

## 2018-02-14 ENCOUNTER — Other Ambulatory Visit: Payer: Self-pay | Admitting: Physician Assistant

## 2018-02-14 DIAGNOSIS — F419 Anxiety disorder, unspecified: Secondary | ICD-10-CM

## 2018-03-17 ENCOUNTER — Encounter: Payer: Medicare Other | Admitting: Student in an Organized Health Care Education/Training Program

## 2018-03-30 DIAGNOSIS — Z23 Encounter for immunization: Secondary | ICD-10-CM | POA: Diagnosis not present

## 2018-03-30 DIAGNOSIS — S61411A Laceration without foreign body of right hand, initial encounter: Secondary | ICD-10-CM | POA: Diagnosis not present

## 2018-04-07 ENCOUNTER — Telehealth: Payer: Self-pay | Admitting: Family Medicine

## 2018-04-07 NOTE — Telephone Encounter (Signed)
Patient advised. She states she does not feel like coming in for a OV. She states she will wait until [name] returns to the clinic and also wait and see if symptoms improve.
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
## 2018-04-07 NOTE — Telephone Encounter (Signed)
She is welcome to come in for evaluation, but for now Xanax will not be increased.  She is not on a small dose, given her age at this time  Brita Romp, Dionne Bucy, MD, MPH [facility] 04/07/2018 1:35 PM

## 2018-04-12 DIAGNOSIS — Z4802 Encounter for removal of sutures: Secondary | ICD-10-CM | POA: Diagnosis not present

## 2018-04-12 DIAGNOSIS — S61411D Laceration without foreign body of right hand, subsequent encounter: Secondary | ICD-10-CM | POA: Diagnosis not present
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
## 2018-04-15 ENCOUNTER — Encounter: Payer: Self-pay | Admitting: Physician Assistant

## 2018-04-15 ENCOUNTER — Ambulatory Visit (INDEPENDENT_AMBULATORY_CARE_PROVIDER_SITE_OTHER): Payer: Medicare Other | Admitting: Physician Assistant

## 2018-04-15 VITALS — BP 150/70 | HR 85 | Temp 97.6°F | Resp 16 | Wt 163.0 lb

## 2018-04-15 DIAGNOSIS — F411 Generalized anxiety disorder: Secondary | ICD-10-CM | POA: Diagnosis not present

## 2018-04-15 DIAGNOSIS — Z23 Encounter for immunization: Secondary | ICD-10-CM | POA: Diagnosis not present

## 2018-04-15 MED ORDER — BUPROPION HCL ER (XL) 150 MG PO TB24: 150.0000 mg | ORAL_TABLET | Freq: Every day | ORAL | 0 refills | Status: DC

## 2018-04-15 NOTE — Patient Instructions (Signed)
Bupropion extended-release tablets (Depression/Mood Disorders) What is this medicine? BUPROPION (byoo PROE pee on) is used to treat depression. This medicine Beckstrand be used for other purposes; ask your health care provider or pharmacist if you have questions. COMMON BRAND NAME(S): Aplenzin, Budeprion XL, Forfivo XL, Wellbutrin XL What should I tell my health care provider before I take this medicine? They need to know if you have any of these conditions: -an eating disorder, such as anorexia or bulimia -bipolar disorder or psychosis -diabetes or high blood sugar, treated with medication -glaucoma -head injury or brain tumor -heart disease, previous heart attack, or irregular heart beat -high blood pressure -kidney or liver disease -seizures (convulsions) -suicidal thoughts or a previous suicide attempt -Tourette's syndrome -weight loss -an unusual or allergic reaction to bupropion, other medicines, foods, dyes, or preservatives -breast-feeding -pregnant or trying to become pregnant How should I use this medicine? Take this medicine by mouth with a glass of water. Follow the directions on the prescription label. You can take it with or without food. If it upsets your stomach, take it with food. Do not crush, chew, or cut these tablets. This medicine is taken once daily at the same time each day. Do not take your medicine more often than directed. Do not stop taking this medicine suddenly except upon the advice of your doctor. Stopping this medicine too quickly Govan cause serious side effects or your condition Infantino worsen. A special MedGuide will be given to you by the pharmacist with each prescription and refill. Be sure to read this information carefully each time. Talk to your pediatrician regarding the use of this medicine in children. Special care Bingaman be needed. Overdosage: If you think you have taken too much of this medicine contact a poison control center or emergency room at once. NOTE:  This medicine is only for you. Do not share this medicine with others. What if I miss a dose? If you miss a dose, skip the missed dose and take your next tablet at the regular time. Do not take double or extra doses. What Haff interact with this medicine? Do not take this medicine with any of the following medications: -linezolid -MAOIs like Azilect, Carbex, Eldepryl, Marplan, Nardil, and Parnate -methylene blue (injected into a vein) -other medicines that contain bupropion like Zyban This medicine Trzcinski also interact with the following medications: -alcohol -certain medicines for anxiety or sleep -certain medicines for blood pressure like metoprolol, propranolol -certain medicines for depression or psychotic disturbances -certain medicines for HIV or AIDS like efavirenz, lopinavir, nelfinavir, ritonavir -certain medicines for irregular heart beat like propafenone, flecainide -certain medicines for Parkinson's disease like amantadine, levodopa -certain medicines for seizures like carbamazepine, phenytoin, phenobarbital -cimetidine -clopidogrel -cyclophosphamide -digoxin -furazolidone -isoniazid -nicotine -orphenadrine -procarbazine -steroid medicines like prednisone or cortisone -stimulant medicines for attention disorders, weight loss, or to stay awake -tamoxifen -theophylline -thiotepa -ticlopidine -tramadol -warfarin This list Noah not describe all possible interactions. Give your health care provider a list of all the medicines, herbs, non-prescription drugs, or dietary supplements you use. Also tell them if you smoke, drink alcohol, or use illegal drugs. Some items Rummell interact with your medicine. What should I watch for while using this medicine? Tell your doctor if your symptoms do not get better or if they get worse. Visit your doctor or health care professional for regular checks on your progress. Because it Schwegel take several weeks to see the full effects of this medicine, it  is important to continue your treatment as   prescribed by your doctor. Patients and their families should watch out for new or worsening thoughts of suicide or depression. Also watch out for sudden changes in feelings such as feeling anxious, agitated, panicky, irritable, hostile, aggressive, impulsive, severely restless, overly excited and hyperactive, or not being able to sleep. If this happens, especially at the beginning of treatment or after a change in dose, call your health care professional. Avoid alcoholic drinks while taking this medicine. Drinking large amounts of alcoholic beverages, using sleeping or anxiety medicines, or quickly stopping the use of these agents while taking this medicine Pardon increase your risk for a seizure. Do not drive or use heavy machinery until you know how this medicine affects you. This medicine can impair your ability to perform these tasks. Do not take this medicine close to bedtime. It Hill prevent you from sleeping. Your mouth Almeda get dry. Chewing sugarless gum or sucking hard candy, and drinking plenty of water Strickland help. Contact your doctor if the problem does not go away or is severe. The tablet shell for some brands of this medicine does not dissolve. This is normal. The tablet shell Paro appear whole in the stool. This is not a cause for concern. What side effects Dames I notice from receiving this medicine? Side effects that you should report to your doctor or health care professional as soon as possible: -allergic reactions like skin rash, itching or hives, swelling of the face, lips, or tongue -breathing problems -changes in vision -confusion -elevated mood, decreased need for sleep, racing thoughts, impulsive behavior -fast or irregular heartbeat -hallucinations, loss of contact with reality -increased blood pressure -redness, blistering, peeling or loosening of the skin, including inside the mouth -seizures -suicidal thoughts or other mood  changes -unusually weak or tired -vomiting Side effects that usually do not require medical attention (report to your doctor or health care professional if they continue or are bothersome): -constipation -headache -loss of appetite -nausea -tremors -weight loss This list Hennick not describe all possible side effects. Call your doctor for medical advice about side effects. You Niederer report side effects to FDA at 1-800-FDA-1088. Where should I keep my medicine? Keep out of the reach of children. Store at room temperature between 15 and 30 degrees C (59 and 86 degrees F). Throw away any unused medicine after the expiration date. NOTE: This sheet is a summary. It Flamm not cover all possible information. If you have questions about this medicine, talk to your doctor, pharmacist, or health care provider.  2018 Elsevier/Gold Standard (2015-11-15 13:55:13)  

## 2018-05-02 ENCOUNTER — Encounter: Payer: Medicare Other | Admitting: Physician Assistant

## 2018-05-02 ENCOUNTER — Ambulatory Visit: Payer: Medicare Other

## 2018-05-02 NOTE — Progress Notes (Deleted)
       Patient: Jordan Gordon Female    DOB: 1934-03-21   84 y.o.   MRN: [id number] Visit Date: 05/02/2018  Today's Provider: Mar Daring, PA-C   No chief complaint on file.  Subjective:    HPI     Allergies  Allergen Reactions  . Codeine     nausea     Current Outpatient Medications:  .  ALPRAZolam (XANAX) 0.5 MG tablet, Take 1 tablet (0.5 mg total) by mouth 3 (three) times daily as needed for anxiety., Disp: 90 tablet, Rfl: 5 .  buPROPion (WELLBUTRIN XL) 150 MG 24 hr tablet, Take 1 tablet (150 mg total) by mouth daily., Disp: 90 tablet, Rfl: 0 .  gabapentin (NEURONTIN) 100 MG capsule, 100 mg qday, 100 mg qafternoon, 300 mg qhs (Patient not taking: Reported on 04/15/2018), Disp: 150 capsule, Rfl: 3 .  naproxen sodium (ALEVE) 220 MG tablet, Take 220 mg by mouth daily as needed (pain). , Disp: , Rfl:  .  potassium chloride SA (K-DUR,KLOR-CON) 20 MEQ tablet, TAKE ONE TABLET BY MOUTH EVERY DAY (Patient taking differently: Take 20 mEq by mouth 2 (two) times a week. ), Disp: 90 tablet, Rfl: 3 .  sertraline (ZOLOFT) 100 MG tablet, Take 2 tablets (200 mg total) by mouth daily., Disp: 180 tablet, Rfl: 1 .  traZODone (DESYREL) 100 MG tablet, TAKE ONE TABLET EVERY EVENING AT BEDTIME (Patient taking differently: Take 100 mg by mouth at bedtime. ), Disp: 90 tablet, Rfl: 3 .  triamterene-hydrochlorothiazide (MAXZIDE-25) 37.5-25 MG tablet, TAKE ONE TABLET BY MOUTH EVERY DAY, Disp: 90 tablet, Rfl: 3  Review of Systems  Social History   Tobacco Use  . Smoking status: Never Smoker  . Smokeless tobacco: Never Used  Substance Use Topics  . Alcohol use: Yes    Alcohol/week: 3.0 standard drinks    Types: 3 Glasses of wine per week    Comment: OCCASIONAL   Objective:   There were no vitals taken for this visit. There were no vitals filed for this visit.   Physical Exam      Assessment & Plan:           Mar Daring, PA-C  [facility]

## 2018-05-03 ENCOUNTER — Telehealth: Payer: Self-pay

## 2018-05-03 NOTE — Telephone Encounter (Signed)
Noted  

## 2018-05-03 NOTE — Telephone Encounter (Signed)
Pt states that she wants to skip her AWV this year. FYI to PCP! -MM

## 2018-05-03 NOTE — Telephone Encounter (Signed)
Called pt to r/s her previously missed AWV and pt stated she was in the middle of something and would call me back this afternoon. -MM

## 2018-05-09 DIAGNOSIS — H353212 Exudative age-related macular degeneration, right eye, with inactive choroidal neovascularization: Secondary | ICD-10-CM | POA: Diagnosis not present

## 2018-05-09 DIAGNOSIS — H353122 Nonexudative age-related macular degeneration, left eye, intermediate dry stage: Secondary | ICD-10-CM | POA: Diagnosis not present

## 2018-05-16 ENCOUNTER — Encounter: Payer: Self-pay | Admitting: Physician Assistant

## 2018-05-16 ENCOUNTER — Ambulatory Visit (INDEPENDENT_AMBULATORY_CARE_PROVIDER_SITE_OTHER): Payer: Medicare Other | Admitting: Physician Assistant

## 2018-05-16 VITALS — BP 156/85 | HR 92 | Temp 97.5°F | Wt 161.4 lb

## 2018-05-16 DIAGNOSIS — F419 Anxiety disorder, unspecified: Secondary | ICD-10-CM | POA: Diagnosis not present

## 2018-05-16 DIAGNOSIS — Z23 Encounter for immunization: Secondary | ICD-10-CM | POA: Diagnosis not present

## 2018-05-16 DIAGNOSIS — F4323 Adjustment disorder with mixed anxiety and depressed mood: Secondary | ICD-10-CM

## 2018-05-16 NOTE — Progress Notes (Signed)
Patient: Jordan Gordon Female    DOB: 1934-04-13   84 y.o.   MRN: [id number] Visit Date: 05/16/2018  Today's Provider: Mar Daring, PA-C   Chief Complaint  Patient presents with  . Anxiety   Subjective:    HPI  Anxiety Patient presents today for anxiety. Patient is taking the following medication:Alprazolam 0.5 mg, Sertraline 200mg  and Bupropion 150mg  extended-release tablets was added. Patient states she is doing better since last visit. Her sister also reports an improvement in her mood.     GAD 7 : Generalized Anxiety Score 05/16/2018 04/15/2018  Nervous, Anxious, on Edge 1 1  Control/stop worrying 1 1  Worry too much - different things 1 1  Trouble relaxing 2 1  Restless 0 0  Easily annoyed or irritable 0 1  Afraid - awful might happen 1 0  Total GAD 7 Score 6 5  Anxiety Difficulty Somewhat difficult Somewhat difficult     Depression screen [facility] 2/9 05/16/2018 04/15/2018 01/20/2018 11/22/2017 11/22/2017  Decreased Interest 0 2 0 3 3  Down, Depressed, Hopeless 0 2 0 3 3  PHQ - 2 Score 0 4 0 6 6  Altered sleeping 1 0 - 2 2  Tired, decreased energy 2 3 - 3 3  Change in appetite 0 0 - 0 0  Feeling bad or failure about yourself  0 0 - 2 2  Trouble concentrating 1 0 - 2 2  Moving slowly or fidgety/restless 1 0 - 0 0  Suicidal thoughts 2 0 - 0 0  PHQ-9 Score 7 7 - 15 15  Difficult doing work/chores Not difficult at all Somewhat difficult - Not difficult at all Not difficult at all     Current Outpatient Medications:  .  ALPRAZolam (XANAX) 0.5 MG tablet, Take 1 tablet (0.5 mg total) by mouth 3 (three) times daily as needed for anxiety., Disp: 90 tablet, Rfl: 5 .  buPROPion (WELLBUTRIN XL) 150 MG 24 hr tablet, Take 1 tablet (150 mg total) by mouth daily., Disp: 90 tablet, Rfl: 0 .  naproxen sodium (ALEVE) 220 MG tablet, Take 220 mg by mouth daily as needed (pain). , Disp: , Rfl:  .  potassium chloride SA (K-DUR,KLOR-CON) 20 MEQ tablet, TAKE ONE TABLET BY MOUTH EVERY  DAY (Patient taking differently: Take 20 mEq by mouth 2 (two) times a week. ), Disp: 90 tablet, Rfl: 3 .  sertraline (ZOLOFT) 100 MG tablet, Take 2 tablets (200 mg total) by mouth daily., Disp: 180 tablet, Rfl: 1 .  traZODone (DESYREL) 100 MG tablet, TAKE ONE TABLET EVERY EVENING AT BEDTIME (Patient taking differently: Take 100 mg by mouth at bedtime. ), Disp: 90 tablet, Rfl: 3 .  triamterene-hydrochlorothiazide (MAXZIDE-25) 37.5-25 MG tablet, TAKE ONE TABLET BY MOUTH EVERY DAY, Disp: 90 tablet, Rfl: 3 .  gabapentin (NEURONTIN) 100 MG capsule, 100 mg qday, 100 mg qafternoon, 300 mg qhs (Patient not taking: Reported on 04/15/2018), Disp: 150 capsule, Rfl: 3  Review of Systems  Constitutional: Negative.   HENT: Negative.   Respiratory: Negative.   Gastrointestinal: Negative.   Genitourinary: Negative.   Neurological: Negative.   Psychiatric/Behavioral: Negative.     Social History   Tobacco Use  . Smoking status: Never Smoker  . Smokeless tobacco: Never Used  Substance Use Topics  . Alcohol use: Yes    Alcohol/week: 3.0 standard drinks    Types: 3 Glasses of wine per week    Comment: OCCASIONAL   Objective:  BP (!) 156/85 (BP Location: Left Arm, Patient Position: Sitting, Cuff Size: Normal)   Pulse 92   Temp (!) 97.5 F (36.4 C) (Oral)   Wt 161 lb 6.4 oz (73.2 kg)   SpO2 96%   BMI 25.28 kg/m  Vitals:   05/16/18 0835  BP: (!) 156/85  Pulse: 92  Temp: (!) 97.5 F (36.4 C)  TempSrc: Oral  SpO2: 96%  Weight: 161 lb 6.4 oz (73.2 kg)     Physical Exam  Constitutional: She appears well-developed and well-nourished. No distress.  Neck: Normal range of motion. Neck supple.  Cardiovascular: Normal rate, regular rhythm and normal heart sounds. Exam reveals no gallop and no friction rub.  No murmur heard. Pulmonary/Chest: Effort normal and breath sounds normal. No respiratory distress. She has no wheezes. She has no rales.  Skin: She is not diaphoretic.  Psychiatric: She has  a normal mood and affect. Her behavior is normal. Judgment and thought content normal.  Vitals reviewed.      Assessment & Plan:      1. Adjustment disorder with mixed anxiety and depressed mood Improved. Continue Bupropion XR 150mg  daily, sertraline 200mg  at bedtime and alprazolam 0.5 mg tid prn. I will see her back in 3 months.   2. Anxiety See above medical treatment plan.  3. Need for influenza vaccination Flu vaccine given today without complication. Patient sat upright for 15 minutes to check for adverse reaction before being released. - Flu vaccine HIGH DOSE PF (Fluzone High dose)       Mar Daring, PA-C  [facility]

## 2018-06-10 NOTE — Progress Notes (Signed)
Patient: Jordan Gordon Female    DOB: Mar 09, 1934   85 y.o.   MRN: [id number] Visit Date: 06/13/2018  Today's Provider: Mar Daring, PA-C   Chief Complaint  Patient presents with  . Anxiety   Subjective:    HPI  Anxiety Patient presents today for anxiety follow-up. Patient is currently taking Bupropion XR 150mg  daily, Sertraline 200mg  at bedtime and alprazolam 0.5 mg tid prn. Patient states great compliance with treatment. She is doing very well and has no complaints.   Allergies  Allergen Reactions  . Codeine     nausea     Current Outpatient Medications:  .  ALPRAZolam (XANAX) 0.5 MG tablet, Take 1 tablet (0.5 mg total) by mouth 3 (three) times daily as needed for anxiety., Disp: 90 tablet, Rfl: 5 .  buPROPion (WELLBUTRIN XL) 150 MG 24 hr tablet, Take 1 tablet (150 mg total) by mouth daily., Disp: 90 tablet, Rfl: 0 .  naproxen sodium (ALEVE) 220 MG tablet, Take 220 mg by mouth daily as needed (pain). , Disp: , Rfl:  .  potassium chloride SA (K-DUR,KLOR-CON) 20 MEQ tablet, TAKE ONE TABLET BY MOUTH EVERY DAY (Patient taking differently: Take 20 mEq by mouth 2 (two) times a week. ), Disp: 90 tablet, Rfl: 3 .  sertraline (ZOLOFT) 100 MG tablet, Take 2 tablets (200 mg total) by mouth daily., Disp: 180 tablet, Rfl: 1 .  traZODone (DESYREL) 100 MG tablet, TAKE ONE TABLET EVERY EVENING AT BEDTIME (Patient taking differently: Take 100 mg by mouth at bedtime. ), Disp: 90 tablet, Rfl: 3 .  triamterene-hydrochlorothiazide (MAXZIDE-25) 37.5-25 MG tablet, TAKE ONE TABLET BY MOUTH EVERY DAY, Disp: 90 tablet, Rfl: 3 .  gabapentin (NEURONTIN) 100 MG capsule, 100 mg qday, 100 mg qafternoon, 300 mg qhs (Patient not taking: Reported on 04/15/2018), Disp: 150 capsule, Rfl: 3  Review of Systems  Constitutional: Negative for appetite change, chills, fatigue and fever.  Respiratory: Negative for chest tightness and shortness of breath.   Cardiovascular: Negative for chest pain and  palpitations.  Gastrointestinal: Negative for abdominal pain, nausea and vomiting.  Neurological: Negative for dizziness and weakness.  Psychiatric/Behavioral: Negative for agitation, dysphoric mood and sleep disturbance. The patient is not nervous/anxious.     Social History   Tobacco Use  . Smoking status: Never Smoker  . Smokeless tobacco: Never Used  Substance Use Topics  . Alcohol use: Yes    Alcohol/week: 3.0 standard drinks    Types: 3 Glasses of wine per week    Comment: OCCASIONAL      Objective:   BP (!) 160/85 (BP Location: Left Arm, Patient Position: Sitting, Cuff Size: Normal)   Pulse 90   Temp (!) 97.3 F (36.3 C) (Oral)   Wt 162 lb (73.5 kg)   SpO2 98%   BMI 25.37 kg/m  Vitals:   06/13/18 1135  BP: (!) 160/85  Pulse: 90  Temp: (!) 97.3 F (36.3 C)  TempSrc: Oral  SpO2: 98%  Weight: 162 lb (73.5 kg)     Physical Exam Vitals signs reviewed.  Constitutional:      General: She is not in acute distress.    Appearance: She is well-developed. She is not diaphoretic.  Neck:     Musculoskeletal: Normal range of motion and neck supple.  Cardiovascular:     Rate and Rhythm: Normal rate and regular rhythm.     Heart sounds: Normal heart sounds. No murmur. No friction rub. No gallop.  Pulmonary:     Effort: Pulmonary effort is normal. No respiratory distress.     Breath sounds: Normal breath sounds. No wheezing or rales.  Psychiatric:        Mood and Affect: Mood normal.        Speech: Speech normal.        Behavior: Behavior normal.        Thought Content: Thought content normal.        Assessment & Plan    1. GAD (generalized anxiety disorder) Doing well. Continue sertraline 200mg  at bedtime, wellbutrin 150mg  xr daily and alprazolam prn.   2. Depression, major, single episode, in partial remission ([place]) See above medical treatment plan.     Mar Daring, PA-C  [facility]

## 2018-06-13 ENCOUNTER — Encounter: Payer: Self-pay | Admitting: Physician Assistant

## 2018-06-13 ENCOUNTER — Telehealth: Payer: Self-pay

## 2018-06-13 ENCOUNTER — Ambulatory Visit (INDEPENDENT_AMBULATORY_CARE_PROVIDER_SITE_OTHER): Payer: Medicare Other | Admitting: Physician Assistant

## 2018-06-13 VITALS — BP 160/85 | HR 90 | Temp 97.3°F | Wt 162.0 lb

## 2018-06-13 DIAGNOSIS — F411 Generalized anxiety disorder: Secondary | ICD-10-CM

## 2018-06-13 DIAGNOSIS — F324 Major depressive disorder, single episode, in partial remission: Secondary | ICD-10-CM

## 2018-06-13 NOTE — Telephone Encounter (Signed)
FYI. KW 

## 2018-06-13 NOTE — Telephone Encounter (Signed)
Updated.

## 2018-06-13 NOTE — Telephone Encounter (Signed)
Zola Button NP, from [facility] just wants to let Jordan Gordon know that patient is taking phentermine 30 mg daily.  CB# [phone]

## 2018-06-23 ENCOUNTER — Other Ambulatory Visit: Payer: Self-pay | Admitting: Physician Assistant

## 2018-06-23 DIAGNOSIS — F419 Anxiety disorder, unspecified: Secondary | ICD-10-CM

## 2018-06-23 DIAGNOSIS — F3341 Major depressive disorder, recurrent, in partial remission: Secondary | ICD-10-CM

## 2018-06-24 ENCOUNTER — Telehealth: Payer: Self-pay | Admitting: Physician Assistant

## 2018-06-24 NOTE — Telephone Encounter (Signed)
Pt called regarding the Rx  sertraline (ZOLOFT) 100 MG tablet  Pt states she was upped to 2 times instead of 1 time a day.  Pharmacy still has it at 1 a day.   [place], [place] - [address] [phone] (Phone) [phone] (Fax)   Please advise.  Thanks, [employer]

## 2018-06-24 NOTE — Telephone Encounter (Signed)
Medication has already been send to pharmacy with correct dose.

## 2018-07-07 DIAGNOSIS — H401131 Primary open-angle glaucoma, bilateral, mild stage: Secondary | ICD-10-CM | POA: Diagnosis not present

## 2018-07-11 ENCOUNTER — Other Ambulatory Visit: Payer: Self-pay | Admitting: Physician Assistant

## 2018-07-11 DIAGNOSIS — F411 Generalized anxiety disorder: Secondary | ICD-10-CM

## 2018-07-11 NOTE — Telephone Encounter (Signed)
Wellbutrin was refilled earlier today

## 2018-07-11 NOTE — Telephone Encounter (Signed)
Patient called to request refills on Buproprion 150 mg.   She is also taking Sertraline and wanted you to know that.    Please send to [facility].  She wants [place] to call and let her know when this is done.

## 2018-07-11 NOTE — Telephone Encounter (Signed)
Patient advised as directed below.  Thanks,  -Joseline 

## 2018-07-21 DIAGNOSIS — H401131 Primary open-angle glaucoma, bilateral, mild stage: Secondary | ICD-10-CM | POA: Diagnosis not present

## 2018-07-25 DIAGNOSIS — M4126 Other idiopathic scoliosis, lumbar region: Secondary | ICD-10-CM | POA: Diagnosis not present

## 2018-07-25 DIAGNOSIS — M545 Low back pain: Secondary | ICD-10-CM | POA: Diagnosis not present

## 2018-07-25 DIAGNOSIS — M48061 Spinal stenosis, lumbar region without neurogenic claudication: Secondary | ICD-10-CM | POA: Diagnosis not present

## 2018-07-25 DIAGNOSIS — M5136 Other intervertebral disc degeneration, lumbar region: Secondary | ICD-10-CM | POA: Diagnosis not present

## 2018-08-08 DIAGNOSIS — M5136 Other intervertebral disc degeneration, lumbar region: Secondary | ICD-10-CM | POA: Diagnosis not present

## 2018-08-08 DIAGNOSIS — M5416 Radiculopathy, lumbar region: Secondary | ICD-10-CM | POA: Diagnosis not present

## 2018-08-08 DIAGNOSIS — M48062 Spinal stenosis, lumbar region with neurogenic claudication: Secondary | ICD-10-CM | POA: Diagnosis not present

## 2018-08-11 DIAGNOSIS — M5116 Intervertebral disc disorders with radiculopathy, lumbar region: Secondary | ICD-10-CM | POA: Diagnosis not present

## 2018-08-11 DIAGNOSIS — Z86010 Personal history of colonic polyps: Secondary | ICD-10-CM | POA: Diagnosis not present

## 2018-08-11 DIAGNOSIS — K219 Gastro-esophageal reflux disease without esophagitis: Secondary | ICD-10-CM | POA: Diagnosis not present

## 2018-08-11 DIAGNOSIS — M48062 Spinal stenosis, lumbar region with neurogenic claudication: Secondary | ICD-10-CM | POA: Diagnosis not present

## 2018-08-11 DIAGNOSIS — F329 Major depressive disorder, single episode, unspecified: Secondary | ICD-10-CM | POA: Diagnosis not present

## 2018-08-11 DIAGNOSIS — H3530 Unspecified macular degeneration: Secondary | ICD-10-CM | POA: Diagnosis not present

## 2018-08-11 DIAGNOSIS — I10 Essential (primary) hypertension: Secondary | ICD-10-CM | POA: Diagnosis not present

## 2018-08-11 DIAGNOSIS — M4726 Other spondylosis with radiculopathy, lumbar region: Secondary | ICD-10-CM | POA: Diagnosis not present

## 2018-08-16 DIAGNOSIS — M48062 Spinal stenosis, lumbar region with neurogenic claudication: Secondary | ICD-10-CM | POA: Diagnosis not present

## 2018-08-16 DIAGNOSIS — F329 Major depressive disorder, single episode, unspecified: Secondary | ICD-10-CM | POA: Diagnosis not present

## 2018-08-16 DIAGNOSIS — M5116 Intervertebral disc disorders with radiculopathy, lumbar region: Secondary | ICD-10-CM | POA: Diagnosis not present

## 2018-08-16 DIAGNOSIS — K219 Gastro-esophageal reflux disease without esophagitis: Secondary | ICD-10-CM | POA: Diagnosis not present

## 2018-08-16 DIAGNOSIS — M4726 Other spondylosis with radiculopathy, lumbar region: Secondary | ICD-10-CM | POA: Diagnosis not present

## 2018-08-16 DIAGNOSIS — I10 Essential (primary) hypertension: Secondary | ICD-10-CM | POA: Diagnosis not present

## 2018-08-19 DIAGNOSIS — M4726 Other spondylosis with radiculopathy, lumbar region: Secondary | ICD-10-CM | POA: Diagnosis not present

## 2018-08-19 DIAGNOSIS — M48062 Spinal stenosis, lumbar region with neurogenic claudication: Secondary | ICD-10-CM | POA: Diagnosis not present

## 2018-08-19 DIAGNOSIS — K219 Gastro-esophageal reflux disease without esophagitis: Secondary | ICD-10-CM | POA: Diagnosis not present

## 2018-08-19 DIAGNOSIS — I10 Essential (primary) hypertension: Secondary | ICD-10-CM | POA: Diagnosis not present

## 2018-08-19 DIAGNOSIS — F329 Major depressive disorder, single episode, unspecified: Secondary | ICD-10-CM | POA: Diagnosis not present

## 2018-08-19 DIAGNOSIS — M5116 Intervertebral disc disorders with radiculopathy, lumbar region: Secondary | ICD-10-CM | POA: Diagnosis not present

## 2018-08-22 DIAGNOSIS — M5116 Intervertebral disc disorders with radiculopathy, lumbar region: Secondary | ICD-10-CM | POA: Diagnosis not present

## 2018-08-22 DIAGNOSIS — I10 Essential (primary) hypertension: Secondary | ICD-10-CM | POA: Diagnosis not present

## 2018-08-22 DIAGNOSIS — M48062 Spinal stenosis, lumbar region with neurogenic claudication: Secondary | ICD-10-CM | POA: Diagnosis not present

## 2018-08-22 DIAGNOSIS — F329 Major depressive disorder, single episode, unspecified: Secondary | ICD-10-CM | POA: Diagnosis not present

## 2018-08-22 DIAGNOSIS — K219 Gastro-esophageal reflux disease without esophagitis: Secondary | ICD-10-CM | POA: Diagnosis not present

## 2018-08-22 DIAGNOSIS — M4726 Other spondylosis with radiculopathy, lumbar region: Secondary | ICD-10-CM | POA: Diagnosis not present

## 2018-08-23 DIAGNOSIS — H401131 Primary open-angle glaucoma, bilateral, mild stage: Secondary | ICD-10-CM | POA: Diagnosis not present

## 2018-08-24 DIAGNOSIS — F329 Major depressive disorder, single episode, unspecified: Secondary | ICD-10-CM | POA: Diagnosis not present

## 2018-08-24 DIAGNOSIS — M4726 Other spondylosis with radiculopathy, lumbar region: Secondary | ICD-10-CM | POA: Diagnosis not present

## 2018-08-24 DIAGNOSIS — M48062 Spinal stenosis, lumbar region with neurogenic claudication: Secondary | ICD-10-CM | POA: Diagnosis not present

## 2018-08-24 DIAGNOSIS — K219 Gastro-esophageal reflux disease without esophagitis: Secondary | ICD-10-CM | POA: Diagnosis not present

## 2018-08-24 DIAGNOSIS — M5116 Intervertebral disc disorders with radiculopathy, lumbar region: Secondary | ICD-10-CM | POA: Diagnosis not present

## 2018-08-24 DIAGNOSIS — I10 Essential (primary) hypertension: Secondary | ICD-10-CM | POA: Diagnosis not present

## 2018-08-26 ENCOUNTER — Other Ambulatory Visit: Payer: Self-pay | Admitting: Physician Assistant

## 2018-08-26 DIAGNOSIS — F419 Anxiety disorder, unspecified: Secondary | ICD-10-CM

## 2018-08-29 DIAGNOSIS — M4726 Other spondylosis with radiculopathy, lumbar region: Secondary | ICD-10-CM | POA: Diagnosis not present

## 2018-08-29 DIAGNOSIS — K219 Gastro-esophageal reflux disease without esophagitis: Secondary | ICD-10-CM | POA: Diagnosis not present

## 2018-08-29 DIAGNOSIS — M48062 Spinal stenosis, lumbar region with neurogenic claudication: Secondary | ICD-10-CM | POA: Diagnosis not present

## 2018-08-29 DIAGNOSIS — I10 Essential (primary) hypertension: Secondary | ICD-10-CM | POA: Diagnosis not present

## 2018-08-29 DIAGNOSIS — F329 Major depressive disorder, single episode, unspecified: Secondary | ICD-10-CM | POA: Diagnosis not present

## 2018-08-29 DIAGNOSIS — M5116 Intervertebral disc disorders with radiculopathy, lumbar region: Secondary | ICD-10-CM | POA: Diagnosis not present

## 2018-08-31 DIAGNOSIS — M48062 Spinal stenosis, lumbar region with neurogenic claudication: Secondary | ICD-10-CM | POA: Diagnosis not present

## 2018-08-31 DIAGNOSIS — M5116 Intervertebral disc disorders with radiculopathy, lumbar region: Secondary | ICD-10-CM | POA: Diagnosis not present

## 2018-08-31 DIAGNOSIS — F329 Major depressive disorder, single episode, unspecified: Secondary | ICD-10-CM | POA: Diagnosis not present

## 2018-08-31 DIAGNOSIS — I10 Essential (primary) hypertension: Secondary | ICD-10-CM | POA: Diagnosis not present

## 2018-08-31 DIAGNOSIS — M4726 Other spondylosis with radiculopathy, lumbar region: Secondary | ICD-10-CM | POA: Diagnosis not present

## 2018-08-31 DIAGNOSIS — K219 Gastro-esophageal reflux disease without esophagitis: Secondary | ICD-10-CM | POA: Diagnosis not present

## 2018-09-07 DIAGNOSIS — M48062 Spinal stenosis, lumbar region with neurogenic claudication: Secondary | ICD-10-CM | POA: Diagnosis not present

## 2018-09-07 DIAGNOSIS — M4726 Other spondylosis with radiculopathy, lumbar region: Secondary | ICD-10-CM | POA: Diagnosis not present

## 2018-09-07 DIAGNOSIS — F329 Major depressive disorder, single episode, unspecified: Secondary | ICD-10-CM | POA: Diagnosis not present

## 2018-09-07 DIAGNOSIS — M5116 Intervertebral disc disorders with radiculopathy, lumbar region: Secondary | ICD-10-CM | POA: Diagnosis not present

## 2018-09-07 DIAGNOSIS — I10 Essential (primary) hypertension: Secondary | ICD-10-CM | POA: Diagnosis not present

## 2018-09-07 DIAGNOSIS — K219 Gastro-esophageal reflux disease without esophagitis: Secondary | ICD-10-CM | POA: Diagnosis not present

## 2018-09-13 ENCOUNTER — Other Ambulatory Visit: Payer: Self-pay | Admitting: Physician Assistant

## 2018-09-13 DIAGNOSIS — I10 Essential (primary) hypertension: Secondary | ICD-10-CM

## 2018-09-19 DIAGNOSIS — M5136 Other intervertebral disc degeneration, lumbar region: Secondary | ICD-10-CM | POA: Diagnosis not present

## 2018-09-20 ENCOUNTER — Other Ambulatory Visit: Payer: Self-pay | Admitting: Physician Assistant

## 2018-09-20 DIAGNOSIS — G4700 Insomnia, unspecified: Secondary | ICD-10-CM

## 2018-10-06 ENCOUNTER — Other Ambulatory Visit: Payer: Self-pay | Admitting: Physician Assistant

## 2018-10-06 DIAGNOSIS — F419 Anxiety disorder, unspecified: Secondary | ICD-10-CM

## 2018-10-19 DIAGNOSIS — H401131 Primary open-angle glaucoma, bilateral, mild stage: Secondary | ICD-10-CM | POA: Diagnosis not present

## 2018-11-07 ENCOUNTER — Other Ambulatory Visit: Payer: Self-pay | Admitting: Physician Assistant

## 2018-11-07 DIAGNOSIS — E876 Hypokalemia: Secondary | ICD-10-CM

## 2018-11-08 DIAGNOSIS — H353212 Exudative age-related macular degeneration, right eye, with inactive choroidal neovascularization: Secondary | ICD-10-CM | POA: Diagnosis not present

## 2018-11-08 DIAGNOSIS — H353122 Nonexudative age-related macular degeneration, left eye, intermediate dry stage: Secondary | ICD-10-CM | POA: Diagnosis not present

## 2018-11-14 ENCOUNTER — Other Ambulatory Visit: Payer: Self-pay

## 2018-11-14 ENCOUNTER — Emergency Department
Admission: EM | Admit: 2018-11-14 | Discharge: 2018-11-14 | Disposition: A | Discharge status: Home / Self Care | Payer: Medicare Other | Attending: Emergency Medicine | Admitting: Emergency Medicine

## 2018-11-14 ENCOUNTER — Encounter: Payer: Self-pay | Admitting: *Deleted

## 2018-11-14 DIAGNOSIS — I10 Essential (primary) hypertension: Secondary | ICD-10-CM | POA: Insufficient documentation

## 2018-11-14 DIAGNOSIS — Y92018 Other place in single-family (private) house as the place of occurrence of the external cause: Secondary | ICD-10-CM | POA: Insufficient documentation

## 2018-11-14 DIAGNOSIS — S0990XA Unspecified injury of head, initial encounter: Secondary | ICD-10-CM | POA: Diagnosis not present

## 2018-11-14 DIAGNOSIS — S0101XA Laceration without foreign body of scalp, initial encounter: Secondary | ICD-10-CM | POA: Insufficient documentation

## 2018-11-14 DIAGNOSIS — Y998 Other external cause status: Secondary | ICD-10-CM | POA: Insufficient documentation

## 2018-11-14 DIAGNOSIS — Z79899 Other long term (current) drug therapy: Secondary | ICD-10-CM | POA: Insufficient documentation

## 2018-11-14 DIAGNOSIS — Y9389 Activity, other specified: Secondary | ICD-10-CM | POA: Insufficient documentation

## 2018-11-14 DIAGNOSIS — I2510 Atherosclerotic heart disease of native coronary artery without angina pectoris: Secondary | ICD-10-CM | POA: Insufficient documentation

## 2018-11-14 DIAGNOSIS — W07XXXA Fall from chair, initial encounter: Secondary | ICD-10-CM | POA: Diagnosis not present

## 2018-11-14 DIAGNOSIS — Z96651 Presence of right artificial knee joint: Secondary | ICD-10-CM | POA: Diagnosis not present

## 2018-11-14 DIAGNOSIS — Y92009 Unspecified place in unspecified non-institutional (private) residence as the place of occurrence of the external cause: Secondary | ICD-10-CM

## 2018-11-14 DIAGNOSIS — W19XXXA Unspecified fall, initial encounter: Secondary | ICD-10-CM

## 2018-11-14 NOTE — ED Triage Notes (Addendum)
Pt ambulatory to triage.  Pt fell off a stool and struck head on the floor   No loc  No vomting  No blood thinners.  No dizziness or chest pain.  Pt has lac to back of head.  Bleeding controlled.  Pt alert  Speech clear.  Pt denies neck or back pain.

## 2018-11-14 NOTE — ED Provider Notes (Signed)
[facility] Emergency Department Provider Note  ____________________________________________  Time seen: Approximately 6:16 PM  I have reviewed the triage vital signs and the nursing notes.   HISTORY  Chief Complaint Fall and Laceration    HPI Jordan Gordon is a 85 y.o. female who presents the emergency department complaining of laceration to the scalp after a fall at home.  Patient reports that she was standing on a chair at home testing when the chair slipped, causing her to fall.  She did strike her head on the floor.  She denies any loss of consciousness.  Patient denies any headache, visual changes, nausea, vomiting, neck pain, chest pain, shortness of breath.  Patient's only concern is laceration to the scalp.  She is unsure whether she needed staples or not.  Patient is up-to-date on her tetanus immunization.  Patient denies any anticoagulation use.         Past Medical History:  Diagnosis Date  . Anxiety   . Coronary artery disease   . GERD (gastroesophageal reflux disease)   . HOH (hard of hearing)   . Hyperlipidemia   . Hypertension   . Major depressive disorder     Patient Active Problem List   Diagnosis Date Noted  . Mechanical and motor problems with internal organs 04/18/2015  . Abnormal foot pulse 04/18/2015  . Clinical depression 04/18/2015  . Fatigue 04/18/2015  . Blood glucose elevated 04/18/2015  . Decreased potassium in the blood 04/18/2015  . Low back pain 04/18/2015  . Paresthesia of skin 04/18/2015  . Gastro-esophageal reflux disease with esophagitis 04/18/2015  . Anxiety 01/07/2015  . Chest pain 09/20/2014  . History of cardiac catheterization 03/08/2014  . Disorder resulting from impaired renal function 07/29/2009  . Muscle ache 03/15/2009  . SOB (shortness of breath) on exertion 09/26/2008  . Adaptation reaction 07/21/2006  . Osteoarthritis 07/21/2006  . 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 Essential (primary)  hypertension 04/16/1998  . Allergic rhinitis 03/12/1998  . Insomnia 03/04/1998  . Menopausal and perimenopausal disorder 02/02/1997  . HLD (hyperlipidemia) 12/06/1991    Past Surgical History:  Procedure Laterality Date  . ABDOMINAL HYSTERECTOMY  1979  . BREAST EXCISIONAL BIOPSY Left 2005   benign  . CATARACT EXTRACTION W/PHACO Left 02/08/2018   Procedure: CATARACT EXTRACTION PHACO AND INTRAOCULAR LENS PLACEMENT (IOC);  Surgeon: Birder Robson, MD;  Location: [facility] ORS;  Service: Ophthalmology;  Laterality: Left;  [place] 00:33.4 AP% 13.9 CDE 4.66 Fluid Pack lot # [id number]  . COLONOSCOPY WITH PROPOFOL N/A 07/22/2015   Procedure: COLONOSCOPY WITH PROPOFOL;  Surgeon: Manya Silvas, MD;  Location: [facility] ENDOSCOPY;  Service: Endoscopy;  Laterality: N/A;  . COLONOSCOPY WITH PROPOFOL N/A 10/19/2016   Procedure: COLONOSCOPY WITH PROPOFOL;  Surgeon: Manya Silvas, MD;  Location: [facility] ENDOSCOPY;  Service: Endoscopy;  Laterality: N/A;  . JOINT REPLACEMENT     TKR  . KNEE SURGERY Left 2011   2007  . TONSILLECTOMY  1940   ADENOIDS    Prior to Admission medications   Medication Sig Start Date End Date Taking? Authorizing Provider  ALPRAZolam Duanne Moron) 0.5 MG tablet TAKE ONE TABLET BY MOUTH 3 TIMES DAILY AS NEEDED FOR ANXIETY 10/06/18   Mar Daring, PA-C  buPROPion (WELLBUTRIN XL) 150 MG 24 hr tablet TAKE 1 TABLET BY MOUTH DAILY 07/11/18   Mar Daring, PA-C  naproxen sodium (ALEVE) 220 MG tablet Take 220 mg by mouth daily as needed (pain).     [provider]  phentermine 30 MG capsule Take 1 capsule (30 mg total) by mouth every morning. 06/13/18   Mar Daring, PA-C  potassium chloride SA (K-DUR) 20 MEQ tablet TAKE ONE TABLET EVERY DAY 11/07/18   Mar Daring, PA-C  sertraline (ZOLOFT) 100 MG tablet Take 2 tablets (200 mg total) by mouth at bedtime. 06/24/18   Mar Daring, PA-C  traZODone (DESYREL) 100 MG tablet TAKE ONE TABLET BY MOUTH AT BEDTIME  09/20/18   Mar Daring, PA-C  triamterene-hydrochlorothiazide (MAXZIDE-25) 37.5-25 MG tablet TAKE ONE TABLET BY MOUTH EVERY DAY 09/13/18   Mar Daring, PA-C    Allergies Codeine  Family History  Problem Relation Age of Onset  . Cancer Mother   . Heart disease Father   . Cancer Brother   . Breast cancer Neg Hx     Social History Social History   Tobacco Use  . Smoking status: Never Smoker  . Smokeless tobacco: Never Used  Substance Use Topics  . Alcohol use: Yes    Alcohol/week: 3.0 standard drinks    Types: 3 Glasses of wine per week    Comment: OCCASIONAL  . Drug use: No     Review of Systems  Constitutional: No fever/chills.  Positive for head injury with scalp laceration Eyes: No visual changes. No discharge ENT: No upper respiratory complaints. Cardiovascular: no chest pain. Respiratory: no cough. No SOB. Gastrointestinal: No abdominal pain.  No nausea, no vomiting.  No diarrhea.  No constipation. Musculoskeletal: Negative for musculoskeletal pain. Skin: Negative for rash, abrasions, lacerations, ecchymosis. Neurological: Negative for headaches, focal weakness or numbness. 10-point ROS otherwise negative.  ____________________________________________   PHYSICAL EXAM:  VITAL SIGNS: ED Triage Vitals  Enc Vitals Group     BP 11/14/18 1750 (!) 200/78     Pulse Rate 11/14/18 1750 76     Resp 11/14/18 1750 20     Temp 11/14/18 1750 98.5 F (36.9 C)     Temp Source 11/14/18 1750 Oral     SpO2 11/14/18 1750 96 %     Weight 11/14/18 1751 165 lb (74.8 kg)     Height 11/14/18 1751 5\' 6"  (1.676 m)     Head Circumference --      Peak Flow --      Pain Score 11/14/18 1751 2     Pain Loc --      Pain Edu? --      Excl. in [place]? --      Constitutional: Alert and oriented. Well appearing and in no acute distress. Eyes: Conjunctivae are normal. PERRL. EOMI. Head: 3 cm superficial laceration noted to the occipital skull.  No active bleeding.  No  visible foreign body.  Laceration is very superficial with no gaped edges.  No underlying hematoma.  No other traumatic findings to the skull or face.  No battle signs, raccoon eyes, serosanguineous fluid drainage from the ears or nares. ENT:      Ears:       Nose: No congestion/rhinnorhea.      Mouth/Throat: Mucous membranes are moist.  Neck: No stridor.  No cervical spine tenderness to palpation.  Cardiovascular: Normal rate, regular rhythm. Normal S1 and S2.  Good peripheral circulation. Respiratory: Normal respiratory effort without tachypnea or retractions. Lungs CTAB. Good air entry to the bases with no decreased or absent breath sounds. Musculoskeletal: Full range of motion to all extremities. No gross deformities appreciated. Neurologic:  Normal speech and language. No gross focal neurologic deficits are  appreciated.  Cranial nerves II through XII grossly intact negative Romberg's and pronator drift. Skin:  Skin is warm, dry and intact. No rash noted. Psychiatric: Mood and affect are normal. Speech and behavior are normal. Patient exhibits appropriate insight and judgement.   ____________________________________________   LABS (all labs ordered are listed, but only abnormal results are displayed)  Labs Reviewed - No data to display ____________________________________________  EKG   ____________________________________________  RADIOLOGY   No results found.  ____________________________________________    PROCEDURES  Procedure(s) performed:    Procedures    Medications - No data to display   ____________________________________________   INITIAL IMPRESSION / ASSESSMENT AND PLAN / ED COURSE  Pertinent labs & imaging results that were available during my care of the patient were reviewed by me and considered in my medical decision making (see chart for details).  Review of the [place] CSRS was performed in accordance of the [place] prior to dispensing any  controlled drugs.           Patient's diagnosis is consistent with scalp laceration after fall.  Patient presented to the emergency department with a complaint of laceration to the scalp.  Patient is unsure how deep this was and did not know whether she needed closure or not.  Laceration itself was very superficial.  There would be no improvement with sutures or staples.  I discussed the mechanism of injury with the patient, given her age and I recommended CT scan.  Patient did have a reassuring neuro exam.  No loss of consciousness.  No anticoagulation use.  No headache.  Patient states that she does not want any imaging at this time.  I discussed the risks of declining imaging.  Patient is aware of the risks, aware of the differential to include skull fracture and intracranial hemorrhage.  Patient states that her only concern was laceration and as this does not require closure she is ready to be discharged.  I have discussed reasons to return to include headache, blurred vision, nausea, vomiting, loss of consciousness, weakness, numbness and tingling.  Patient verbalizes understanding of this and states that she will return if she begins to experience any symptoms.  However, patient continues to state that she does not want any imaging and would like to return home.  Again, exam is reassuring.  No neuro deficits.  Patient is able to remember all events.  She is capable of making her own decisions at this time.  Patient will be discharged..  Tylenol or Motrin for any headache.  Follow-up with primary care as needed.. Patient is given ED precautions to return to the ED for any worsening or new symptoms.     ____________________________________________  FINAL CLINICAL IMPRESSION(S) / ED DIAGNOSES  Final diagnoses:  Fall in home, initial encounter  Injury of head, initial encounter  Laceration of scalp, initial encounter      NEW MEDICATIONS STARTED DURING THIS VISIT:  ED Discharge Orders     None          This chart was dictated using voice recognition software/Dragon. Despite best efforts to proofread, errors can occur which can change the meaning. Any change was purely unintentional.    Darletta Moll, PA-C 11/14/18 Tami Ribas, MD 11/14/18 [phone]

## 2018-11-14 NOTE — ED Notes (Signed)
See triage note  States she was up on chair dusting and fell  Hitting the back of her head  Laceration noted no LOC

## 2018-11-28 ENCOUNTER — Encounter: Payer: Self-pay | Admitting: Physician Assistant

## 2018-11-28 ENCOUNTER — Ambulatory Visit (INDEPENDENT_AMBULATORY_CARE_PROVIDER_SITE_OTHER): Payer: Medicare Other | Admitting: Physician Assistant

## 2018-11-28 ENCOUNTER — Other Ambulatory Visit: Payer: Self-pay

## 2018-11-28 VITALS — BP 164/78 | HR 81 | Temp 98.0°F | Resp 16 | Wt 164.6 lb

## 2018-11-28 DIAGNOSIS — F411 Generalized anxiety disorder: Secondary | ICD-10-CM | POA: Diagnosis not present

## 2018-11-28 MED ORDER — VENLAFAXINE HCL ER 150 MG PO CP24: 150.0000 mg | ORAL_CAPSULE | Freq: Every day | ORAL | 0 refills | Status: DC

## 2018-11-28 NOTE — Patient Instructions (Signed)
Venlafaxine extended-release capsules What is this medicine? VENLAFAXINE(VEN la fax een) is used to treat depression, anxiety and panic disorder. This medicine Lardizabal be used for other purposes; ask your health care provider or pharmacist if you have questions. COMMON BRAND NAME(S): Effexor XR What should I tell my health care provider before I take this medicine? They need to know if you have any of these conditions: -bleeding disorders -glaucoma -heart disease -high blood pressure -high cholesterol -kidney disease -liver disease -low levels of sodium in the blood -mania or bipolar disorder -seizures -suicidal thoughts, plans, or attempt; a previous suicide attempt by you or a family -take medicines that treat or prevent blood clots -thyroid disease -an unusual or allergic reaction to venlafaxine, desvenlafaxine, other medicines, foods, dyes, or preservatives -pregnant or trying to get pregnant -breast-feeding How should I use this medicine? Take this medicine by mouth with a full glass of water. Follow the directions on the prescription label. Do not cut, crush, or chew this medicine. Take it with food. If needed, the capsule Luth be carefully opened and the entire contents sprinkled on a spoonful of cool applesauce. Swallow the applesauce/pellet mixture right away without chewing and follow with a glass of water to ensure complete swallowing of the pellets. Try to take your medicine at about the same time each day. Do not take your medicine more often than directed. Do not stop taking this medicine suddenly except upon the advice of your doctor. Stopping this medicine too quickly Kusch cause serious side effects or your condition Dillenbeck worsen. A special MedGuide will be given to you by the pharmacist with each prescription and refill. Be sure to read this information carefully each time. Talk to your pediatrician regarding the use of this medicine in children. Special care Casaus be  needed. Overdosage: If you think you have taken too much of this medicine contact a poison control center or emergency room at once. NOTE: This medicine is only for you. Do not share this medicine with others. What if I miss a dose? If you miss a dose, take it as soon as you can. If it is almost time for your next dose, take only that dose. Do not take double or extra doses. What Morissette interact with this medicine? Do not take this medicine with any of the following medications: -certain medicines for fungal infections like fluconazole, itraconazole, ketoconazole, posaconazole, voriconazole -cisapride -desvenlafaxine -dofetilide -dronedarone -duloxetine -levomilnacipran -linezolid -MAOIs like Carbex, Eldepryl, Marplan, Nardil, and Parnate -methylene blue (injected into a vein) -milnacipran -pimozide -thioridazine -ziprasidone This medicine Larsen also interact with the following medications: -amphetamines -aspirin and aspirin-like medicines -certain medicines for depression, anxiety, or psychotic disturbances -certain medicines for migraine headaches like almotriptan, eletriptan, frovatriptan, naratriptan, rizatriptan, sumatriptan, zolmitriptan -certain medicines for sleep -certain medicines that treat or prevent blood clots like dalteparin, enoxaparin, warfarin -cimetidine -clozapine -diuretics -fentanyl -furazolidone -indinavir -isoniazid -lithium -metoprolol -NSAIDS, medicines for pain and inflammation, like ibuprofen or naproxen -other medicines that prolong the QT interval (cause an abnormal heart rhythm) -procarbazine -rasagiline -supplements like St. [name]'s wort, kava kava, valerian -tramadol -tryptophan This list Malia not describe all possible interactions. Give your health care provider a list of all the medicines, herbs, non-prescription drugs, or dietary supplements you use. Also tell them if you smoke, drink alcohol, or use illegal drugs. Some items Welker interact with  your medicine. What should I watch for while using this medicine? Tell your doctor if your symptoms do not get better or if they get  worse. Visit your doctor or health care professional for regular checks on your progress. Because it Kasson take several weeks to see the full effects of this medicine, it is important to continue your treatment as prescribed by your doctor. Patients and their families should watch out for new or worsening thoughts of suicide or depression. Also watch out for sudden changes in feelings such as feeling anxious, agitated, panicky, irritable, hostile, aggressive, impulsive, severely restless, overly excited and hyperactive, or not being able to sleep. If this happens, especially at the beginning of treatment or after a change in dose, call your health care professional. This medicine can cause an increase in blood pressure. Check with your doctor for instructions on monitoring your blood pressure while taking this medicine. You Hindley get drowsy or dizzy. Do not drive, use machinery, or do anything that needs mental alertness until you know how this medicine affects you. Do not stand or sit up quickly, especially if you are an older patient. This reduces the risk of dizzy or fainting spells. Alcohol Schweer interfere with the effect of this medicine. Avoid alcoholic drinks. Your mouth Labuda get dry. Chewing sugarless gum, sucking hard candy and drinking plenty of water will help. Contact your doctor if the problem does not go away or is severe. What side effects Leeman I notice from receiving this medicine? Side effects that you should report to your doctor or health care professional as soon as possible: -allergic reactions like skin rash, itching or hives, swelling of the face, lips, or tongue -anxious -breathing problems -confusion -changes in vision -chest pain -confusion -elevated mood, decreased need for sleep, racing thoughts, impulsive behavior -eye pain -fast, irregular  heartbeat -feeling faint or lightheaded, falls -feeling agitated, angry, or irritable -hallucination, loss of contact with reality -high blood pressure -loss of balance or coordination -palpitations -redness, blistering, peeling or loosening of the skin, including inside the mouth -restlessness, pacing, inability to keep still -seizures -stiff muscles -suicidal thoughts or other mood changes -trouble passing urine or change in the amount of urine -trouble sleeping -unusual bleeding or bruising -unusually weak or tired -vomiting Side effects that usually do not require medical attention (report to your doctor or health care professional if they continue or are bothersome): -change in sex drive or performance -change in appetite or weight -constipation -dizziness -dry mouth -headache -increased sweating -nausea -tired This list Deakins not describe all possible side effects. Call your doctor for medical advice about side effects. You Culotta report side effects to FDA at 1-800-FDA-1088. Where should I keep my medicine? Keep out of the reach of children. Store at a controlled temperature between 20 and 25 degrees C (68 degrees and 77 degrees F), in a dry place. Throw away any unused medicine after the expiration date. NOTE: This sheet is a summary. It Kuznicki not cover all possible information. If you have questions about this medicine, talk to your doctor, pharmacist, or health care provider.  2019 Elsevier/Gold Standard (2015-10-24 18:38:02)

## 2018-11-28 NOTE — Progress Notes (Signed)
Patient: Jordan Gordon Female    DOB: [birth year]   85 y.o.   MRN: [id number] Visit Date: 11/28/2018  Today's Provider: Mar Daring, PA-C   Chief Complaint  Patient presents with  . Anxiety   Subjective:    I,[name],RMA am acting as a Education administrator for Mar Daring, PA-C.  HPI  Patient here with c/o anxiety. Patient here feeling very anxious and reports that she feels depressed. She lives by herself and feels lonely and reports that her grandson is going to [place] point. She has previously tried Celexa 40mg  without symptom improvement. Currently on Sertraline 200mg , wellbutrin xr 150mg , alprazolam 0.5mg  TID prn, and trazodone 100mg  for sleep.   Depression screen [facility] 2/9 11/28/2018 05/16/2018 04/15/2018  Decreased Interest 3 0 2  Down, Depressed, Hopeless 1 0 2  PHQ - 2 Score 4 0 4  Altered sleeping 0 1 0  Tired, decreased energy 3 2 3   Change in appetite 1 0 0  Feeling bad or failure about yourself  2 0 0  Trouble concentrating 0 1 0  Moving slowly or fidgety/restless 1 1 0  Suicidal thoughts 0 2 0  PHQ-9 Score 11 7 7   Difficult doing work/chores Somewhat difficult Not difficult at all Somewhat difficult  Some recent data might be hidden    Allergies  Allergen Reactions  . Codeine     nausea     Current Outpatient Medications:  .  ALPRAZolam (XANAX) 0.5 MG tablet, TAKE ONE TABLET BY MOUTH 3 TIMES DAILY AS NEEDED FOR ANXIETY, Disp: 90 tablet, Rfl: 5 .  buPROPion (WELLBUTRIN XL) 150 MG 24 hr tablet, TAKE 1 TABLET BY MOUTH DAILY, Disp: 90 tablet, Rfl: 1 .  naproxen sodium (ALEVE) 220 MG tablet, Take 220 mg by mouth daily as needed (pain). , Disp: , Rfl:  .  potassium chloride SA (K-DUR) 20 MEQ tablet, TAKE ONE TABLET EVERY DAY, Disp: 90 tablet, Rfl: 0 .  sertraline (ZOLOFT) 100 MG tablet, Take 2 tablets (200 mg total) by mouth at bedtime., Disp: 180 tablet, Rfl: 1 .  traZODone (DESYREL) 100 MG tablet, TAKE ONE TABLET BY MOUTH AT BEDTIME, Disp: 90 tablet,  Rfl: 3 .  triamterene-hydrochlorothiazide (MAXZIDE-25) 37.5-25 MG tablet, TAKE ONE TABLET BY MOUTH EVERY DAY, Disp: 90 tablet, Rfl: 3 .  phentermine 30 MG capsule, Take 1 capsule (30 mg total) by mouth every morning., Disp: 30 capsule, Rfl: 0  Review of Systems  Constitutional: Negative.   Respiratory: Negative.   Cardiovascular: Negative.   Gastrointestinal: Negative.   Neurological: Negative.   Psychiatric/Behavioral: Positive for dysphoric mood and sleep disturbance. The patient is nervous/anxious.     Social History   Tobacco Use  . Smoking status: Never Smoker  . Smokeless tobacco: Never Used  Substance Use Topics  . Alcohol use: Yes    Alcohol/week: 3.0 standard drinks    Types: 3 Glasses of wine per week    Comment: OCCASIONAL      Objective:   BP (!) 164/78 (BP Location: Left Arm, Patient Position: Sitting, Cuff Size: Large)   Pulse 81   Temp 98 F (36.7 C) (Oral)   Resp 16   Wt 164 lb 9.6 oz (74.7 kg)   BMI 26.57 kg/m  Vitals:   11/28/18 1039  BP: (!) 164/78  Pulse: 81  Resp: 16  Temp: 98 F (36.7 C)  TempSrc: Oral  Weight: 164 lb 9.6 oz (74.7 kg)     Physical  Exam Vitals signs reviewed.  Constitutional:      General: She is not in acute distress.    Appearance: Normal appearance. She is well-developed. She is not ill-appearing or diaphoretic.  Neck:     Musculoskeletal: Normal range of motion and neck supple.     Thyroid: No thyromegaly.     Vascular: No JVD.     Trachea: No tracheal deviation.  Cardiovascular:     Rate and Rhythm: Normal rate and regular rhythm.     Heart sounds: Normal heart sounds. No murmur. No friction rub. No gallop.   Pulmonary:     Effort: Pulmonary effort is normal. No respiratory distress.     Breath sounds: Normal breath sounds. No wheezing or rales.  Lymphadenopathy:     Cervical: No cervical adenopathy.  Neurological:     Mental Status: She is alert.  Psychiatric:        Attention and Perception: Attention and  perception normal.        Mood and Affect: Mood is anxious and depressed.        Speech: Speech normal.        Behavior: Behavior normal. Behavior is cooperative.        Thought Content: Thought content normal.        Cognition and Memory: Cognition and memory normal.        Judgment: Judgment normal.         Assessment & Plan    1. GAD (generalized anxiety disorder) Stop sertraline, start venlafaxine as below. Continue Wellbutrin XR 150mg , trazodone 100mg  and alprazolam 0.5mg  TID. I will f/u with her in 2-4 weeks for a telephone visit to see how she is tolerating this medication and if symptoms are slowly improving some.  - venlafaxine XR (EFFEXOR-XR) 150 MG 24 hr capsule; Take 1 capsule (150 mg total) by mouth daily with breakfast.  Dispense: 30 capsule; Refill: 0     Mar Daring, PA-C  [facility]

## 2018-11-29 ENCOUNTER — Other Ambulatory Visit: Payer: Self-pay | Admitting: Physician Assistant

## 2018-11-29 DIAGNOSIS — E876 Hypokalemia: Secondary | ICD-10-CM

## 2018-11-29 NOTE — Telephone Encounter (Signed)
Pt stated she doesn't need refill on potassium chloride SA (K-DUR) 20 MEQ tablet because she has plenty. Thanks TNP

## 2018-12-14 ENCOUNTER — Encounter: Payer: Self-pay | Admitting: Physician Assistant

## 2018-12-14 ENCOUNTER — Ambulatory Visit (INDEPENDENT_AMBULATORY_CARE_PROVIDER_SITE_OTHER): Payer: Medicare Other | Admitting: Physician Assistant

## 2018-12-14 ENCOUNTER — Ambulatory Visit: Payer: Self-pay | Admitting: Physician Assistant

## 2018-12-14 ENCOUNTER — Other Ambulatory Visit: Payer: Self-pay

## 2018-12-14 VITALS — BP 155/75 | HR 74 | Temp 98.1°F | Resp 16 | Wt 163.0 lb

## 2018-12-14 DIAGNOSIS — F411 Generalized anxiety disorder: Secondary | ICD-10-CM | POA: Diagnosis not present

## 2018-12-14 DIAGNOSIS — F339 Major depressive disorder, recurrent, unspecified: Secondary | ICD-10-CM | POA: Diagnosis not present

## 2018-12-14 MED ORDER — SERTRALINE HCL 100 MG PO TABS: 200.0000 mg | ORAL_TABLET | Freq: Every day | ORAL | 1 refills | Status: DC

## 2018-12-14 NOTE — Progress Notes (Signed)
Patient: Jordan Gordon Female    DOB: 11-Feb-1934   85 y.o.   MRN: [id number] Visit Date: 12/14/2018  Today's Provider: Mar Daring, PA-C   Chief Complaint  Patient presents with  . Follow-up    Anxiety   Subjective:    I,Jordan Gordon,RMA am acting as a Education administrator for [employer], PA-C.  HPI   Anxiety, Follow up:  The patient was last seen for Anxiety 2 weeks ago. Changes made since that visit include Stop Sertraline, start Venlafaxine. Continue Wellbutrin XR 150mg , Trazodone 100mg  and Alprazolam 0.5 TID. Patient reports that she did not start the Venlafaxine. ------------------------------------------------------------------------    Allergies  Allergen Reactions  . Codeine     nausea     Current Outpatient Medications:  .  ALPRAZolam (XANAX) 0.5 MG tablet, TAKE ONE TABLET BY MOUTH 3 TIMES DAILY AS NEEDED FOR ANXIETY, Disp: 90 tablet, Rfl: 5 .  buPROPion (WELLBUTRIN XL) 150 MG 24 hr tablet, TAKE 1 TABLET BY MOUTH DAILY, Disp: 90 tablet, Rfl: 1 .  naproxen sodium (ALEVE) 220 MG tablet, Take 220 mg by mouth daily as needed (pain). , Disp: , Rfl:  .  potassium chloride SA (K-DUR) 20 MEQ tablet, TAKE ONE TABLET EVERY DAY, Disp: 90 tablet, Rfl: 0 .  traZODone (DESYREL) 100 MG tablet, TAKE ONE TABLET BY MOUTH AT BEDTIME, Disp: 90 tablet, Rfl: 3 .  triamterene-hydrochlorothiazide (MAXZIDE-25) 37.5-25 MG tablet, TAKE ONE TABLET BY MOUTH EVERY DAY, Disp: 90 tablet, Rfl: 3 .  phentermine 30 MG capsule, Take 1 capsule (30 mg total) by mouth every morning., Disp: 30 capsule, Rfl: 0 .  venlafaxine XR (EFFEXOR-XR) 150 MG 24 hr capsule, Take 1 capsule (150 mg total) by mouth daily with breakfast. (Patient not taking: Reported on 12/14/2018), Disp: 30 capsule, Rfl: 0  Review of Systems  Constitutional: Negative.   Respiratory: Negative.   Cardiovascular: Negative.   Psychiatric/Behavioral: The patient is nervous/anxious.     Social History   Tobacco Use  .  Smoking status: Never Smoker  . Smokeless tobacco: Never Used  Substance Use Topics  . Alcohol use: Yes    Alcohol/week: 3.0 standard drinks    Types: 3 Glasses of wine per week    Comment: OCCASIONAL      Objective:   BP (!) 155/75 (BP Location: Left Arm, Patient Position: Sitting, Cuff Size: Large)   Pulse 74   Temp 98.1 F (36.7 C) (Oral)   Resp 16   Wt 163 lb (73.9 kg)   BMI 26.31 kg/m  Vitals:   12/14/18 1328  BP: (!) 155/75  Pulse: 74  Resp: 16  Temp: 98.1 F (36.7 C)  TempSrc: Oral  Weight: 163 lb (73.9 kg)     Physical Exam Vitals signs reviewed.  Constitutional:      General: She is not in acute distress.    Appearance: Normal appearance. She is well-developed. She is not ill-appearing.  HENT:     Head: Normocephalic and atraumatic.  Neck:     Musculoskeletal: Normal range of motion and neck supple.  Pulmonary:     Effort: Pulmonary effort is normal. No respiratory distress.  Neurological:     Mental Status: She is alert.  Psychiatric:        Mood and Affect: Mood normal.        Behavior: Behavior normal.        Thought Content: Thought content normal.        Judgment:  Judgment normal.      No results found for any visits on 12/14/18.     Assessment & Plan    1. Depression, recurrent ([place]) Patient reports she is feeling a little better than compared to two weeks ago. Her grandson leaves next Tuesday for [facility]. She would like to stay on Sertraline at this time with the wellbutrin and the alprazolam prn. Call if worsening. - sertraline (ZOLOFT) 100 MG tablet; Take 2 tablets (200 mg total) by mouth at bedtime.  Dispense: 180 tablet; Refill: 1  2. GAD (generalized anxiety disorder) See above medical treatment plan.     Mar Daring, PA-C  [facility]

## 2018-12-15 ENCOUNTER — Other Ambulatory Visit: Payer: Self-pay | Admitting: Physician Assistant

## 2018-12-15 DIAGNOSIS — F411 Generalized anxiety disorder: Secondary | ICD-10-CM

## 2018-12-26 ENCOUNTER — Emergency Department: Payer: Medicare Other

## 2018-12-26 ENCOUNTER — Other Ambulatory Visit: Payer: Self-pay

## 2018-12-26 ENCOUNTER — Emergency Department
Admission: EM | Admit: 2018-12-26 | Discharge: 2018-12-26 | Disposition: A | Discharge status: Home / Self Care | Payer: Medicare Other | Attending: Emergency Medicine | Admitting: Emergency Medicine

## 2018-12-26 DIAGNOSIS — I10 Essential (primary) hypertension: Secondary | ICD-10-CM | POA: Diagnosis not present

## 2018-12-26 DIAGNOSIS — Z96652 Presence of left artificial knee joint: Secondary | ICD-10-CM | POA: Diagnosis not present

## 2018-12-26 DIAGNOSIS — N390 Urinary tract infection, site not specified: Secondary | ICD-10-CM | POA: Diagnosis not present

## 2018-12-26 DIAGNOSIS — R079 Chest pain, unspecified: Secondary | ICD-10-CM | POA: Diagnosis not present

## 2018-12-26 DIAGNOSIS — I2510 Atherosclerotic heart disease of native coronary artery without angina pectoris: Secondary | ICD-10-CM | POA: Insufficient documentation

## 2018-12-26 DIAGNOSIS — Z79899 Other long term (current) drug therapy: Secondary | ICD-10-CM | POA: Insufficient documentation

## 2018-12-26 LAB — URINALYSIS, COMPLETE (UACMP) WITH MICROSCOPIC
Bilirubin Urine: NEGATIVE
Glucose, UA: NEGATIVE mg/dL
Hgb urine dipstick: NEGATIVE
Ketones, ur: NEGATIVE mg/dL
Nitrite: NEGATIVE
Protein, ur: NEGATIVE mg/dL
Specific Gravity, Urine: 1.018 (ref 1.005–1.030)
WBC, UA: >50 WBC/hpf — ABNORMAL HIGH (ref 0–5)
pH: 6 (ref 5.0–8.0)

## 2018-12-26 LAB — BASIC METABOLIC PANEL
Anion gap: 9 (ref 5–15)
BUN: 23 mg/dL (ref 8–23)
CO2: 29 mmol/L (ref 22–32)
Calcium: 10.4 mg/dL — ABNORMAL HIGH (ref 8.9–10.3)
Chloride: 103 mmol/L (ref 98–111)
Creatinine, Ser: 0.96 mg/dL (ref 0.44–1.00)
GFR calc Af Amer: >60 mL/min (ref >60)
GFR calc non Af Amer: 55 mL/min — ABNORMAL LOW (ref >60)
Glucose, Bld: 106 mg/dL — ABNORMAL HIGH (ref 70–99)
Potassium: 4.1 mmol/L (ref 3.5–5.1)
Sodium: 141 mmol/L (ref 135–145)

## 2018-12-26 LAB — TROPONIN I (HIGH SENSITIVITY)
Troponin I (High Sensitivity): 4 ng/L (ref <18)
Troponin I (High Sensitivity): 4 ng/L (ref <18)

## 2018-12-26 LAB — CBC
HCT: 43.8 % (ref 36.0–46.0)
Hemoglobin: 15.2 g/dL — ABNORMAL HIGH (ref 12.0–15.0)
MCH: 30.7 pg (ref 26.0–34.0)
MCHC: 34.7 g/dL (ref 30.0–36.0)
MCV: 88.5 fL (ref 80.0–100.0)
Platelets: 173 10*3/uL (ref 150–400)
RBC: 4.95 MIL/uL (ref 3.87–5.11)
RDW: 12.9 % (ref 11.5–15.5)
WBC: 5.4 10*3/uL (ref 4.0–10.5)
nRBC: 0 % (ref 0.0–0.2)

## 2018-12-26 LAB — LIPASE, BLOOD: Lipase: 31 U/L (ref 11–51)

## 2018-12-26 LAB — TSH: TSH: 3.479 u[IU]/mL (ref 0.350–4.500)

## 2018-12-26 MED ORDER — ONDANSETRON 8 MG PO TBDP: 8.0000 mg | ORAL_TABLET | Freq: Three times a day (TID) | ORAL | 0 refills | Status: DC | PRN

## 2018-12-26 MED ORDER — CEPHALEXIN 500 MG PO CAPS: 500.0000 mg | ORAL_CAPSULE | Freq: Two times a day (BID) | ORAL | 0 refills | Status: AC

## 2018-12-26 NOTE — ED Notes (Signed)
Lab called for add on orders. Replied received and are running blood work now.

## 2018-12-26 NOTE — Discharge Instructions (Addendum)
Your urine is showing signs of a urinary tract infection.  Take the antibiotic as prescribed and finish the full course.  You can use the nausea medicine as needed.  Return to the ER for new, worsening, persistent chest pain, fatigue, weakness, vomiting, fever, or any other new or worsening symptoms that concern you.  You should follow-up with your primary care doctor within the next few weeks.

## 2018-12-26 NOTE — ED Notes (Signed)
Sent green,purple,and red top tubes to lab.

## 2018-12-26 NOTE — ED Notes (Signed)
As per patient, son updated and told to go home, until further notice from pt. Pt said that she would get a ride home from someone else. Sharyn Lull in registration called son from stat desk to notify pt's son.

## 2018-12-26 NOTE — ED Notes (Signed)
"   Reports chest pain that comes and goes 1-2 weeks ago. Reports pain is mostly when she eats, also states her legs feel bad and just feeling terrible". Labs resulted. SR on cardiac monitor. Vss. Call light w/i reach. Safety maintaned. Awaiting further plan of care.

## 2018-12-26 NOTE — ED Triage Notes (Signed)
Reports nausea and epigastric chest pain after eating "everytime for the last few days". Denies pain at this time.Pt alert and oriented X4, active, cooperative, pt in NAD. RR even and unlabored, color WNL.

## 2018-12-26 NOTE — ED Provider Notes (Signed)
[facility] Emergency Department Provider Note ____________________________________________   First MD Initiated Contact with Patient 12/26/18 1213     (approximate)  I have reviewed the triage vital signs and the nursing notes.   HISTORY  Chief Complaint Chest Pain and Nausea    HPI Jordan Gordon is a 85 y.o. female with PMH as noted below who presents with multiple complaints.  Primarily she states that she has had epigastric and chest pain associated with nausea and worse after she eats.  However, the patient states that she is nauseous all the time.  She reports that she has generalized weakness and fatigue which has been going on for months.  In addition she states that her legs feel like they will give out on her.  She states that all of these complaints have been going on for months and does not have any specific reason that she came to the emergency department today.  Past Medical History:  Diagnosis Date  . Anxiety   . Coronary artery disease   . GERD (gastroesophageal reflux disease)   . HOH (hard of hearing)   . Hyperlipidemia   . Hypertension   . Major depressive disorder     Patient Active Problem List   Diagnosis Date Noted  . Mechanical and motor problems with internal organs 04/18/2015  . Abnormal foot pulse 04/18/2015  . Clinical depression 04/18/2015  . Fatigue 04/18/2015  . Blood glucose elevated 04/18/2015  . Decreased potassium in the blood 04/18/2015  . Low back pain 04/18/2015  . Paresthesia of skin 04/18/2015  . Gastro-esophageal reflux disease with esophagitis 04/18/2015  . Anxiety 01/07/2015  . Chest pain 09/20/2014  . History of cardiac catheterization 03/08/2014  . Disorder resulting from impaired renal function 07/29/2009  . Muscle ache 03/15/2009  . SOB (shortness of breath) on exertion 09/26/2008  . Adaptation reaction 07/21/2006  . Osteoarthritis 07/21/2006  . 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  Essential (primary) hypertension 04/16/1998  . Allergic rhinitis 03/12/1998  . Insomnia 03/04/1998  . Menopausal and perimenopausal disorder 02/02/1997  . HLD (hyperlipidemia) 12/06/1991    Past Surgical History:  Procedure Laterality Date  . ABDOMINAL HYSTERECTOMY  1979  . BREAST EXCISIONAL BIOPSY Left 2005   benign  . CATARACT EXTRACTION W/PHACO Left 02/08/2018   Procedure: CATARACT EXTRACTION PHACO AND INTRAOCULAR LENS PLACEMENT (IOC);  Surgeon: Birder Robson, MD;  Location: [facility] ORS;  Service: Ophthalmology;  Laterality: Left;  [place] 00:33.4 AP% 13.9 CDE 4.66 Fluid Pack lot # [id number]  . COLONOSCOPY WITH PROPOFOL N/A 07/22/2015   Procedure: COLONOSCOPY WITH PROPOFOL;  Surgeon: Manya Silvas, MD;  Location: [facility] ENDOSCOPY;  Service: Endoscopy;  Laterality: N/A;  . COLONOSCOPY WITH PROPOFOL N/A 10/19/2016   Procedure: COLONOSCOPY WITH PROPOFOL;  Surgeon: Manya Silvas, MD;  Location: [facility] ENDOSCOPY;  Service: Endoscopy;  Laterality: N/A;  . JOINT REPLACEMENT     TKR  . KNEE SURGERY Left 2011   2007  . TONSILLECTOMY  1940   ADENOIDS    Prior to Admission medications   Medication Sig Start Date End Date Taking? Authorizing Provider  ALPRAZolam Duanne Moron) 0.5 MG tablet TAKE ONE TABLET BY MOUTH 3 TIMES DAILY AS NEEDED FOR ANXIETY 10/06/18   Mar Daring, PA-C  buPROPion (WELLBUTRIN XL) 150 MG 24 hr tablet TAKE 1 TABLET BY MOUTH DAILY 12/15/18   Mar Daring, PA-C  cephALEXin (KEFLEX) 500 MG capsule Take 1 capsule (500 mg total) by mouth 2 (two) times  daily for 7 days. 12/26/18 01/02/19  Arta Silence, MD  naproxen sodium (ALEVE) 220 MG tablet Take 220 mg by mouth daily as needed (pain).     [provider]  ondansetron (ZOFRAN ODT) 8 MG disintegrating tablet Take 1 tablet (8 mg total) by mouth every 8 (eight) hours as needed for nausea. 12/26/18   Arta Silence, MD  phentermine 30 MG capsule Take 1 capsule (30 mg total) by mouth every morning. 06/13/18    Mar Daring, PA-C  potassium chloride SA (K-DUR) 20 MEQ tablet TAKE ONE TABLET EVERY DAY 11/07/18   Mar Daring, PA-C  sertraline (ZOLOFT) 100 MG tablet Take 2 tablets (200 mg total) by mouth at bedtime. 12/14/18   Mar Daring, PA-C  traZODone (DESYREL) 100 MG tablet TAKE ONE TABLET BY MOUTH AT BEDTIME 09/20/18   Mar Daring, PA-C  triamterene-hydrochlorothiazide (MAXZIDE-25) 37.5-25 MG tablet TAKE ONE TABLET BY MOUTH EVERY DAY 09/13/18   Mar Daring, PA-C    Allergies Codeine  Family History  Problem Relation Age of Onset  . Cancer Mother   . Heart disease Father   . Cancer Brother   . Breast cancer Neg Hx     Social History Social History   Tobacco Use  . Smoking status: Never Smoker  . Smokeless tobacco: Never Used  Substance Use Topics  . Alcohol use: Yes    Alcohol/week: 3.0 standard drinks    Types: 3 Glasses of wine per week    Comment: OCCASIONAL  . Drug use: No    Review of Systems  Constitutional: No fever.  Positive for fatigue. Eyes: No redness. ENT: No sore throat. Cardiovascular: Positive for chest pain. Respiratory: Denies shortness of breath. Gastrointestinal: Positive for nausea.  No vomiting or diarrhea. Genitourinary: Negative for dysuria.  Musculoskeletal: Negative for back pain. Skin: Negative for rash. Neurological: Negative for headache.   ____________________________________________   PHYSICAL EXAM:  VITAL SIGNS: ED Triage Vitals [12/26/18 1055]  Enc Vitals Group     BP 127/86     Pulse Rate 85     Resp 18     Temp 98.4 F (36.9 C)     Temp Source Oral     SpO2 100 %     Weight 168 lb (76.2 kg)     Height 5\' 5"  (1.651 m)     Head Circumference      Peak Flow      Pain Score 0     Pain Loc      Pain Edu?      Excl. in [place]?     Constitutional: Alert and oriented. Well appearing and in no acute distress. Eyes: Conjunctivae are normal.  Head: Atraumatic. Nose: No congestion/rhinnorhea.  Mouth/Throat: Mucous membranes are moist.   Neck: Normal range of motion.  Cardiovascular: Normal rate, regular rhythm. Good peripheral circulation. Respiratory: Normal respiratory effort.  No retractions.  Gastrointestinal: Soft and nontender. No distention.  Genitourinary: No flank tenderness. Musculoskeletal: No lower extremity edema.  Extremities warm and well perfused.  Neurologic:  Normal speech and language. No gross focal neurologic deficits are appreciated.  Skin:  Skin is warm and dry. No rash noted. Psychiatric: Mood and affect are normal. Speech and behavior are normal.  ____________________________________________   LABS (all labs ordered are listed, but only abnormal results are displayed)  Labs Reviewed  BASIC METABOLIC PANEL - Abnormal; Notable for the following components:      Result Value   Glucose, Bld 106 (*)  Calcium 10.4 (*)    GFR calc non Af Amer 55 (*)    All other components within normal limits  CBC - Abnormal; Notable for the following components:   Hemoglobin 15.2 (*)    All other components within normal limits  URINALYSIS, COMPLETE (UACMP) WITH MICROSCOPIC - Abnormal; Notable for the following components:   Color, Urine YELLOW (*)    APPearance CLOUDY (*)    Leukocytes,Ua LARGE (*)    WBC, UA >50 (*)    Bacteria, UA RARE (*)    Non Squamous Epithelial PRESENT (*)    All other components within normal limits  TSH  LIPASE, BLOOD  TROPONIN I (HIGH SENSITIVITY)  TROPONIN I (HIGH SENSITIVITY)   ____________________________________________  EKG  ED ECG REPORT I, Arta Silence, the attending physician, personally viewed and interpreted this ECG.  Date: 12/26/2018 EKG Time: 1054 Rate: 87 Rhythm: normal sinus rhythm QRS Axis: normal Intervals: normal ST/T Wave abnormalities: normal Narrative Interpretation: no evidence of acute ischemia  ____________________________________________  RADIOLOGY  CXR: No focal infiltrate or other  acute abnormality  ____________________________________________   PROCEDURES  Procedure(s) performed: No  Procedures  Critical Care performed: No ____________________________________________   INITIAL IMPRESSION / ASSESSMENT AND PLAN / ED COURSE  Pertinent labs & imaging results that were available during my care of the patient were reviewed by me and considered in my medical decision making (see chart for details).  85 year old female with PMH as noted above presents with multiple complaints today, all of which have been going on for a long time, at least several months.  She specifically reports chest and epigastric discomfort which is intermittent and often worse after eating.  She has constant nausea, and reports generalized fatigue.  She denies any significant weight loss or abdominal distention.  On exam the patient is well-appearing and her vital signs are normal except for mild hypertension.  Her abdomen is soft and nontender.  The remainder of the exam is unremarkable.  EKG is nonischemic.  Initial lab work-up is unremarkable.  Differential for the patient's primary complaint would mainly include GERD, gastritis, PUD, or less likely hepatobiliary etiology.  There is no clinical evidence for ACS.  Differential for the generalized fatigue and other symptoms includes anemia, dehydration, anxiety.  There is no evidence at this time to suggest a malignancy.  I will add on a lipase, urinalysis, and TSH to the labs already obtained.  If these are negative, anticipate discharge home with outpatient follow-up.  ----------------------------------------- 2:20 PM on 12/26/2018 -----------------------------------------  Additional labs are unremarkable, however UA shows findings consistent with a UTI.  After discussion with the patient, we will treat empirically.  Patient feels well and wants to go home.  I counseled her on the results of the work-up.  Return precautions given, and she  expresses understanding.   ____________________________________________   FINAL CLINICAL IMPRESSION(S) / ED DIAGNOSES  Final diagnoses:  Urinary tract infection without hematuria, site unspecified      NEW MEDICATIONS STARTED DURING THIS VISIT:  New Prescriptions   CEPHALEXIN (KEFLEX) 500 MG CAPSULE    Take 1 capsule (500 mg total) by mouth 2 (two) times daily for 7 days.   ONDANSETRON (ZOFRAN ODT) 8 MG DISINTEGRATING TABLET    Take 1 tablet (8 mg total) by mouth every 8 (eight) hours as needed for nausea.     Note:  This document was prepared using Dragon voice recognition software and [place] include unintentional dictation errors.    Arta Silence, MD 12/26/18  1421  

## 2018-12-26 NOTE — ED Notes (Signed)
Urine and repeat trop drawn and sent.

## 2019-01-03 ENCOUNTER — Telehealth: Payer: Self-pay | Admitting: Physician Assistant

## 2019-01-03 NOTE — Telephone Encounter (Signed)
Patient is going to do a telephone visit with [name] on Thursday. Asked patient if she wants to be tested for Covid she said not really, that she really need to see [name]. Explained to patient that if she is having covid symptoms we can't see her in the office but that [name] can do the telephone visit. Patient agreed to the telephone visit for Thursday and doesn't want to be tested.

## 2019-01-03 NOTE — Telephone Encounter (Signed)
Please advise 

## 2019-01-03 NOTE — Telephone Encounter (Signed)
Needs telephone visit.   Does she want to be covid tested for the symptoms she is having?

## 2019-01-03 NOTE — Telephone Encounter (Signed)
Pt needing to see [name] if possible regarding her depression and depression Rx.  Pt has some Covid symptoms: Sweats Shortness of Breath Fatigue Legs Aches  Pt will do telephone but wants to see [name].  Pt has appt 01-05-2019  8:40 appt.  Please advise pt.  Thanks, [employer]

## 2019-01-05 ENCOUNTER — Ambulatory Visit (INDEPENDENT_AMBULATORY_CARE_PROVIDER_SITE_OTHER): Payer: Medicare Other | Admitting: Physician Assistant

## 2019-01-05 ENCOUNTER — Encounter: Payer: Self-pay | Admitting: Physician Assistant

## 2019-01-05 ENCOUNTER — Other Ambulatory Visit: Payer: Self-pay

## 2019-01-05 DIAGNOSIS — K582 Mixed irritable bowel syndrome: Secondary | ICD-10-CM

## 2019-01-05 DIAGNOSIS — D126 Benign neoplasm of colon, unspecified: Secondary | ICD-10-CM | POA: Diagnosis not present

## 2019-01-05 DIAGNOSIS — Z1211 Encounter for screening for malignant neoplasm of colon: Secondary | ICD-10-CM | POA: Diagnosis not present

## 2019-01-05 MED ORDER — DICYCLOMINE HCL 20 MG PO TABS: 20.0000 mg | ORAL_TABLET | Freq: Three times a day (TID) | ORAL | 0 refills | Status: DC

## 2019-01-05 NOTE — Progress Notes (Signed)
Patient: Jordan Gordon Female    DOB: Nov 15, 1933   85 y.o.   MRN: [id number] Visit Date: 01/05/2019  Today's Provider: Mar Daring, PA-C   Chief Complaint  Patient presents with  . Nausea  . Diarrhea   Subjective:     HPI  Patient states that she feels sick on her stomach, body aches, chest pain, constipation and diarrhea for one week.  She feels terrible, feels as if she is dying. She thinks it Voong be her anxiety.  Also of note she was seen in the ER on 12/26/18 for similar symptoms. All workup was unremarkable, with exception of UA (consistent with UTI). She was given zofran and symptoms improved and sent home with 7 days of Keflex. She reports she completed treatment but her stomach symptoms have been unchanged.   She has had increase stress recently with her grandson going to [employer] for college. He left around the same time symptoms started.   Patient discontinued wellbutrin on her own as well. Reports still taking sertraline 200mg , trazodone 100mg  and alprazolam 0.5mg  TID.   Virtual Visit via Telephone Note  I connected with Jordan Gordon on 01/05/19 at  8:40 AM EDT by telephone and verified that I am speaking with the correct person using two identifiers.  Location: Patient: home Provider: [facility]   I discussed the limitations, risks, security and privacy concerns of performing an evaluation and management service by telephone and the availability of in person appointments. I also discussed with the patient that there Preuss be a patient responsible charge related to this service. The patient expressed understanding and agreed to proceed.   Allergies  Allergen Reactions  . Codeine     nausea     Current Outpatient Medications:  .  ALPRAZolam (XANAX) 0.5 MG tablet, TAKE ONE TABLET BY MOUTH 3 TIMES DAILY AS NEEDED FOR ANXIETY, Disp: 90 tablet, Rfl: 5 .  buPROPion (WELLBUTRIN XL) 150 MG 24 hr tablet, TAKE 1 TABLET BY MOUTH DAILY, Disp: 90 tablet, Rfl: 1 .   ondansetron (ZOFRAN ODT) 8 MG disintegrating tablet, Take 1 tablet (8 mg total) by mouth every 8 (eight) hours as needed for nausea., Disp: 10 tablet, Rfl: 0 .  potassium chloride SA (K-DUR) 20 MEQ tablet, TAKE ONE TABLET EVERY DAY, Disp: 90 tablet, Rfl: 0 .  sertraline (ZOLOFT) 100 MG tablet, Take 2 tablets (200 mg total) by mouth at bedtime., Disp: 180 tablet, Rfl: 1 .  traZODone (DESYREL) 100 MG tablet, TAKE ONE TABLET BY MOUTH AT BEDTIME, Disp: 90 tablet, Rfl: 3 .  triamterene-hydrochlorothiazide (MAXZIDE-25) 37.5-25 MG tablet, TAKE ONE TABLET BY MOUTH EVERY DAY, Disp: 90 tablet, Rfl: 3 .  naproxen sodium (ALEVE) 220 MG tablet, Take 220 mg by mouth daily as needed (pain). , Disp: , Rfl:   Review of Systems  Constitutional: Negative.   HENT: Negative.   Respiratory: Negative.   Cardiovascular: Positive for chest pain.  Gastrointestinal: Positive for constipation, diarrhea and nausea.  Neurological: Negative.   Psychiatric/Behavioral: The patient is nervous/anxious.     Social History   Tobacco Use  . Smoking status: Never Smoker  . Smokeless tobacco: Never Used  Substance Use Topics  . Alcohol use: Yes    Alcohol/week: 3.0 standard drinks    Types: 3 Glasses of wine per week    Comment: OCCASIONAL      Objective:   There were no vitals taken for this visit. There were no vitals filed for  this visit.   Physical Exam Vitals signs reviewed.  Constitutional:      General: She is not in acute distress.    Appearance: She is well-developed.  Pulmonary:     Effort: Pulmonary effort is normal. No respiratory distress.  Psychiatric:        Mood and Affect: Mood is anxious.        Behavior: Behavior normal.        Thought Content: Thought content normal.        Judgment: Judgment normal.      No results found for any visits on 01/05/19.     Assessment & Plan    1. Colon cancer screening Patient was due for colonoscopy last year but had declined. Now reports black  stools. Known history of many polyps. In 2018 had enough polyps that Dr. Vira Agar wanted to repeat in 1 year. Patient is aware Dr. Vira Agar has retired and is ok seeing any other providers. Referral placed.  - Ambulatory referral to Gastroenterology  2. Adenomatous polyp of colon, unspecified part of colon See above medical treatment plan. - Ambulatory referral to Gastroenterology  3. Irritable bowel syndrome with both constipation and diarrhea Suspect symptoms c/w IBS. Will try Bentyl as below. Call if symptoms worsen. I will see her back in 10 days.  - dicyclomine (BENTYL) 20 MG tablet; Take 1 tablet (20 mg total) by mouth 4 (four) times daily -  before meals and at bedtime.  Dispense: 120 tablet; Refill: 0    I discussed the assessment and treatment plan with the patient. The patient was provided an opportunity to ask questions and all were answered. The patient agreed with the plan and demonstrated an understanding of the instructions.   The patient was advised to call back or seek an in-person evaluation if the symptoms worsen or if the condition fails to improve as anticipated.  I provided 18 minutes of non-face-to-face time during this encounter. Mar Daring, PA-C  [facility]

## 2019-01-09 ENCOUNTER — Telehealth: Payer: Self-pay | Admitting: Physician Assistant

## 2019-01-09 NOTE — Telephone Encounter (Signed)
Called patient tonight and she states that her son has taken her medications and "rations" them out to her. She reports he left her the trazodone, the bentyl and a few alprazolam. She states he his normally really good to her, but occasionally "goes bezerk." He does not live with her but lives up the road. She states that he told her today "she was not sick and just wants attention." I did advise her that she needs to take the sertraline every day and not stop that medication cold [place] as she could have withdrawal symptoms that will make her feel worse. I asked if I could call her son, and she said "not tonight."   She is going to her sister's house tonight to try to relax. I will call her tomorrow to follow up.

## 2019-01-09 NOTE — Telephone Encounter (Signed)
Patient called for update on message

## 2019-01-09 NOTE — Telephone Encounter (Signed)
Has been of of anti-depressant for a week and is not doing well. The new one is not working.    Please call pt back to discuss at [phone].  Thanks, [employer]

## 2019-01-09 NOTE — Telephone Encounter (Signed)
Spoke to patient and advised as directed below. Per patient she just had an argument with her son.she doesn't know what is going on that she is just going to call [facility] tomorrow. I asked her if I can help and what questions does she have. Patient stated "I don't think you can help, I will call provider tomorrow". My son doesn't want to give me the Sertraline then she finished by saying I don't know what's going on I think I am going to go and stay with my sister.

## 2019-01-09 NOTE — Telephone Encounter (Signed)
I dont think I changed her depression medication, just added Bentyl for IBS. Is her IBS worse? I dont think I changed her sertraline. I know she stopped wellbutrin.

## 2019-01-13 ENCOUNTER — Encounter: Payer: Self-pay | Admitting: Emergency Medicine

## 2019-01-13 ENCOUNTER — Emergency Department: Payer: Medicare Other

## 2019-01-13 ENCOUNTER — Other Ambulatory Visit: Payer: Self-pay

## 2019-01-13 ENCOUNTER — Emergency Department
Admission: EM | Admit: 2019-01-13 | Discharge: 2019-01-13 | Disposition: A | Discharge status: Home / Self Care | Payer: Medicare Other | Attending: Emergency Medicine | Admitting: Emergency Medicine

## 2019-01-13 DIAGNOSIS — F331 Major depressive disorder, recurrent, moderate: Secondary | ICD-10-CM | POA: Diagnosis present

## 2019-01-13 DIAGNOSIS — I10 Essential (primary) hypertension: Secondary | ICD-10-CM | POA: Diagnosis not present

## 2019-01-13 DIAGNOSIS — I2510 Atherosclerotic heart disease of native coronary artery without angina pectoris: Secondary | ICD-10-CM | POA: Diagnosis not present

## 2019-01-13 DIAGNOSIS — E876 Hypokalemia: Secondary | ICD-10-CM

## 2019-01-13 DIAGNOSIS — Z79899 Other long term (current) drug therapy: Secondary | ICD-10-CM | POA: Insufficient documentation

## 2019-01-13 DIAGNOSIS — Z96659 Presence of unspecified artificial knee joint: Secondary | ICD-10-CM | POA: Diagnosis not present

## 2019-01-13 DIAGNOSIS — F329 Major depressive disorder, single episode, unspecified: Secondary | ICD-10-CM | POA: Diagnosis not present

## 2019-01-13 DIAGNOSIS — R079 Chest pain, unspecified: Secondary | ICD-10-CM | POA: Diagnosis not present

## 2019-01-13 LAB — CBC
HCT: 42.4 % (ref 36.0–46.0)
Hemoglobin: 14.9 g/dL (ref 12.0–15.0)
MCH: 30.7 pg (ref 26.0–34.0)
MCHC: 35.1 g/dL (ref 30.0–36.0)
MCV: 87.2 fL (ref 80.0–100.0)
Platelets: 181 10*3/uL (ref 150–400)
RBC: 4.86 MIL/uL (ref 3.87–5.11)
RDW: 12.6 % (ref 11.5–15.5)
WBC: 5.7 10*3/uL (ref 4.0–10.5)
nRBC: 0 % (ref 0.0–0.2)

## 2019-01-13 LAB — BASIC METABOLIC PANEL
Anion gap: 10 (ref 5–15)
BUN: 28 mg/dL — ABNORMAL HIGH (ref 8–23)
CO2: 23 mmol/L (ref 22–32)
Calcium: 8.8 mg/dL — ABNORMAL LOW (ref 8.9–10.3)
Chloride: 107 mmol/L (ref 98–111)
Creatinine, Ser: 0.97 mg/dL (ref 0.44–1.00)
GFR calc Af Amer: >60 mL/min (ref >60)
GFR calc non Af Amer: 54 mL/min — ABNORMAL LOW (ref >60)
Glucose, Bld: 138 mg/dL — ABNORMAL HIGH (ref 70–99)
Potassium: 2.7 mmol/L — CL (ref 3.5–5.1)
Sodium: 140 mmol/L (ref 135–145)

## 2019-01-13 LAB — TROPONIN I (HIGH SENSITIVITY): Troponin I (High Sensitivity): 6 ng/L (ref <18)

## 2019-01-13 MED ORDER — POTASSIUM CHLORIDE CRYS ER 20 MEQ PO TBCR
40.0000 meq | EXTENDED_RELEASE_TABLET | Freq: Once | ORAL | Status: AC
  Administered 2019-01-13: 40 meq via ORAL
  Filled 2019-01-13: qty 2

## 2019-01-13 MED ORDER — POTASSIUM CHLORIDE CRYS ER 15 MEQ PO TBCR: 15.0000 meq | EXTENDED_RELEASE_TABLET | Freq: Two times a day (BID) | ORAL | 0 refills | Status: DC

## 2019-01-13 NOTE — ED Notes (Signed)
Pt states that she came to the ED today, because she stopped taking her antidepressants 2 weeks ago, since stopping her medication she states that she is constantly sad, she stated that she feels like she wants to cry but can't cry. Pt denies any pain.

## 2019-01-13 NOTE — BH Assessment (Signed)
Writer provided the patient and her son contact information for outpatient psychiatrist.   Patient denies SI/HI and AV/H.

## 2019-01-13 NOTE — ED Triage Notes (Signed)
Pt presents to ED c/o chest pain and generalized weakness. Reports it is difficult to walk around the house. Pt states she was taken off of antidepressants 2 wks ago and "since then everything has gone downhill." Pt states "my son took me off some of my medications. He thinks I take too many things." See PCP notes regarding same.

## 2019-01-13 NOTE — BH Assessment (Signed)
Assessment Note  Jordan Gordon is an 85 y.o. female who presents to the ER via her son due to concerns about her mood. Per the patient, she stopped taking her antidepressants for approximately one to two weeks. However, she started back on them approximately a week ago. Per the report of the patient's son, the other son started administering her medications for her. In the recent past the patient fell out of her bed and family thought it was due to taking her medications wrong. When they checked her medicine cabinet, "it was medicines everywhere. Old bottles and it was just a mess. Me and my brother believe she wasn't taking the medications right. She don't want to admitted it but she can't see that good anymore. So probably was taking them wrong. One week she was taking laxatives." The patients son who currently oversee's her medications, live across the street from her.  Patient medications are prescribed by her PCP. She states she's never seen a psychiatrist or been inpatient for mental health reasons. Patient was started on the medications approximately fourteen years ago, following the passing of her husband. Per the son, the patient was the primary caregiver and his death was difficult for her.  During the interview, the patient was calm, cooperative and pleasant. She was able to provide appropriate answers to the questions. She denies history of violence and aggression. Throughout the interview, she denied SI/HI and AV/H.  Diagnosis: Depression  Past Medical History:  Past Medical History:  Diagnosis Date  . Anxiety   . Coronary artery disease   . GERD (gastroesophageal reflux disease)   . HOH (hard of hearing)   . Hyperlipidemia   . Hypertension   . Major depressive disorder     Past Surgical History:  Procedure Laterality Date  . ABDOMINAL HYSTERECTOMY  1979  . BREAST EXCISIONAL BIOPSY Left 2005   benign  . CATARACT EXTRACTION W/PHACO Left 02/08/2018   Procedure: CATARACT EXTRACTION  PHACO AND INTRAOCULAR LENS PLACEMENT (IOC);  Surgeon: Birder Robson, MD;  Location: [facility] ORS;  Service: Ophthalmology;  Laterality: Left;  [place] 00:33.4 AP% 13.9 CDE 4.66 Fluid Pack lot # [id number]  . COLONOSCOPY WITH PROPOFOL N/A 07/22/2015   Procedure: COLONOSCOPY WITH PROPOFOL;  Surgeon: Manya Silvas, MD;  Location: [facility] ENDOSCOPY;  Service: Endoscopy;  Laterality: N/A;  . COLONOSCOPY WITH PROPOFOL N/A 10/19/2016   Procedure: COLONOSCOPY WITH PROPOFOL;  Surgeon: Manya Silvas, MD;  Location: [facility] ENDOSCOPY;  Service: Endoscopy;  Laterality: N/A;  . JOINT REPLACEMENT     TKR  . KNEE SURGERY Left 2011   2007  . TONSILLECTOMY  1940   ADENOIDS    Family History:  Family History  Problem Relation Age of Onset  . Cancer Mother   . Heart disease Father   . Cancer Brother   . Breast cancer Neg Hx     Social History:  reports that she has never smoked. She has never used smokeless tobacco. She reports current alcohol use of about 3.0 standard drinks of alcohol per week. She reports that she does not use drugs.  Additional Social History:  Alcohol / Drug Use Pain Medications: See PTA Prescriptions: See PTA Over the Counter: See PTA History of alcohol / drug use?: No history of alcohol / drug abuse Longest period of sobriety (when/how long): n/a  CIWA: CIWA-Ar BP: (!) 169/87 Pulse Rate: 72 COWS:    Allergies:  Allergies  Allergen Reactions  . Codeine     nausea  Home Medications: (Not in a hospital admission)   OB/GYN Status:  No LMP recorded. Patient has had a hysterectomy.  General Assessment Data Location of Assessment: [facility] ED TTS Assessment: In system Is this a Tele or Face-to-Face Assessment?: Face-to-Face Is this an Initial Assessment or a Re-assessment for this encounter?: Initial Assessment Language Other than English: No Living Arrangements: Other (Comment)(Private Home) What gender do you identify as?: Female Marital status: Widowed Pregnancy  Status: No Living Arrangements: Alone Can pt return to current living arrangement?: Yes Admission Status: Voluntary Is patient capable of signing voluntary admission?: Yes Referral Source: Self/Family/Friend Insurance type: Medicare  Medical Screening Exam ([place]) Medical Exam completed: Yes  Crisis Care Plan Living Arrangements: Alone Legal Guardian: Other:(Self) Name of Psychiatrist: Reports of none Name of Therapist: Reports of none  Education Status Is patient currently in school?: No Is the patient employed, unemployed or receiving disability?: Unemployed(Retired)  Risk to self with the past 6 months Suicidal Ideation: No Has patient been a risk to self within the past 6 months prior to admission? : No Suicidal Intent: No Has patient had any suicidal intent within the past 6 months prior to admission? : No Is patient at risk for suicide?: No Suicidal Plan?: No Has patient had any suicidal plan within the past 6 months prior to admission? : No Access to Means: No What has been your use of drugs/alcohol within the last 12 months?: Reports of none Previous Attempts/Gestures: No How many times?: 0 Other Self Harm Risks: Reports of none Triggers for Past Attempts: None known Intentional Self Injurious Behavior: None Family Suicide History: No Recent stressful life event(s): Other (Comment) Persecutory voices/beliefs?: No Depression: Yes Depression Symptoms: Isolating, Feeling worthless/self pity Substance abuse history and/or treatment for substance abuse?: No Suicide prevention information given to non-admitted patients: Not applicable  Risk to Others within the past 6 months Homicidal Ideation: No Does patient have any lifetime risk of violence toward others beyond the six months prior to admission? : No Thoughts of Harm to Others: No Current Homicidal Intent: No Current Homicidal Plan: No Access to Homicidal Means: No Identified Victim: Reports of  none History of harm to others?: No Assessment of Violence: None Noted Violent Behavior Description: Reports of none Does patient have access to weapons?: No Criminal Charges Pending?: No Does patient have a court date: No Is patient on probation?: No  Psychosis Hallucinations: None noted Delusions: None noted  Mental Status Report Appearance/Hygiene: Unremarkable Eye Contact: Good Motor Activity: Unable to assess(Patient sitting on bed) Speech: Logical/coherent, Unremarkable Level of Consciousness: Alert Mood: Pleasant Affect: Appropriate to circumstance Anxiety Level: Minimal Thought Processes: Coherent, Relevant Judgement: Unimpaired Orientation: Person, Place, Time, Situation, Appropriate for developmental age Obsessive Compulsive Thoughts/Behaviors: None  Cognitive Functioning Concentration: Normal Memory: Recent Intact, Remote Intact Is patient IDD: No Insight: Good Impulse Control: Good Appetite: Good Have you had any weight changes? : No Change Sleep: No Change Total Hours of Sleep: 8 Vegetative Symptoms: None  ADLScreening [facility] Assessment Services) Patient's cognitive ability adequate to safely complete daily activities?: Yes Patient able to express need for assistance with ADLs?: Yes Independently performs ADLs?: Yes (appropriate for developmental age)  Prior Inpatient Therapy Prior Inpatient Therapy: No  Prior Outpatient Therapy Prior Outpatient Therapy: No Does patient have an ACCT team?: No Does patient have Intensive In-House Services?  : No Does patient have [facility] services? : No Does patient have P4CC services?: No  ADL Screening (condition at time of admission) Patient's cognitive ability  adequate to safely complete daily activities?: Yes Is the patient deaf or have difficulty hearing?: No Does the patient have difficulty seeing, even when wearing glasses/contacts?: No Does the patient have difficulty concentrating, remembering, or making  decisions?: No Patient able to express need for assistance with ADLs?: Yes Does the patient have difficulty dressing or bathing?: No Independently performs ADLs?: Yes (appropriate for developmental age) Does the patient have difficulty walking or climbing stairs?: No Weakness of Legs: None Weakness of Arms/Hands: None  Home Assistive Devices/Equipment Home Assistive Devices/Equipment: None  Therapy Consults (therapy consults require a physician order) PT Evaluation Needed: No OT Evalulation Needed: No SLP Evaluation Needed: No Abuse/Neglect Assessment (Assessment to be complete while patient is alone) Abuse/Neglect Assessment Can Be Completed: Yes Physical Abuse: Denies Verbal Abuse: Denies Sexual Abuse: Denies Exploitation of patient/patient's resources: Denies Self-Neglect: Denies Values / Beliefs Cultural Requests During Hospitalization: None Spiritual Requests During Hospitalization: None Consults Spiritual Care Consult Needed: No Social Work Consult Needed: No Regulatory affairs officer (For Healthcare) Does Patient Have a Medical Advance Directive?: No Would patient like information on creating a medical advance directive?: No - Patient declined       Child/Adolescent Assessment Running Away Risk: Denies(Patient is an adult)  Disposition:  Disposition Initial Assessment Completed for this Encounter: Yes  On Site Evaluation by:   Reviewed with Physician:    Gunnar Fusi MS, LCAS, [facility], [place], [place] Therapeutic Triage Specialist 01/13/2019 6:48 PM

## 2019-01-13 NOTE — ED Notes (Addendum)
Patient wheeled to car by [employer].

## 2019-01-13 NOTE — Progress Notes (Signed)
Met with the patient and her son.  She stopped her sertraline for two weeks and her depression and anxiety increased, recently restarted it.  She also takes Xanax and Trazodone.  Her son took over her medication management as she does not see well and they believed she was overtaking medications.  Discussed going to a psychiatrist to better manager her  Medications, tapering off the Xanax and minimizing her medications. Family and patient in agreement, resources for psychiatrists provided.  No suicidal/homicidal ideations, hallucinations, substance abuse.  Stable psychiatrically, plan in place.  Waylan Boga, PMHNP

## 2019-01-13 NOTE — ED Provider Notes (Signed)
[name] Emergency Department Provider Note  Time seen: 4:50 PM  I have reviewed the triage vital signs and the nursing notes.   HISTORY  Chief Complaint Chest Pain  HPI  Jordan Gordon is a 85 y.o. female with a past medical history of anxiety, gastric reflux, hypertension, hyperlipidemia, depression, presents to the emergency department for feeling depressed.  According to the patient over the past several weeks to months she has been feeling worsening depression, has been experiencing pain in her back and her legs which she states is chronic.  States she "has just not felt right."  Patient thinks a big part of this is worsening depression.  Denies any SI or HI.  Patient has stated mild chest discomfort intermittently over the past several weeks but none currently.  States she has been experiencing diarrhea at times but thinks that is because of overuse of laxatives, took Imodium and now feels constipated.   Past Medical History:  Diagnosis Date  . Anxiety   . Coronary artery disease   . GERD (gastroesophageal reflux disease)   . HOH (hard of hearing)   . Hyperlipidemia   . Hypertension   . Major depressive disorder     Patient Active Problem List   Diagnosis Date Noted  . Mechanical and motor problems with internal organs 04/18/2015  . Abnormal foot pulse 04/18/2015  . Clinical depression 04/18/2015  . Fatigue 04/18/2015  . Blood glucose elevated 04/18/2015  . Decreased potassium in the blood 04/18/2015  . Low back pain 04/18/2015  . Paresthesia of skin 04/18/2015  . Gastro-esophageal reflux disease with esophagitis 04/18/2015  . Anxiety 01/07/2015  . Chest pain 09/20/2014  . History of cardiac catheterization 03/08/2014  . Disorder resulting from impaired renal function 07/29/2009  . Muscle ache 03/15/2009  . SOB (shortness of breath) on exertion 09/26/2008  . Adaptation reaction 07/21/2006  . Osteoarthritis 07/21/2006  . Benign neoplasm of large  bowel 03/08/2000  . Essential (primary) hypertension 04/16/1998  . Allergic rhinitis 03/12/1998  . Insomnia 03/04/1998  . Menopausal and perimenopausal disorder 02/02/1997  . HLD (hyperlipidemia) 12/06/1991    Past Surgical History:  Procedure Laterality Date  . ABDOMINAL HYSTERECTOMY  1979  . BREAST EXCISIONAL BIOPSY Left 2005   benign  . CATARACT EXTRACTION W/PHACO Left 02/08/2018   Procedure: CATARACT EXTRACTION PHACO AND INTRAOCULAR LENS PLACEMENT (IOC);  Surgeon: Birder Robson, MD;  Location: [facility] ORS;  Service: Ophthalmology;  Laterality: Left;  [place] 00:33.4 AP% 13.9 CDE 4.66 Fluid Pack lot # [id number]  . COLONOSCOPY WITH PROPOFOL N/A 07/22/2015   Procedure: COLONOSCOPY WITH PROPOFOL;  Surgeon: Manya Silvas, MD;  Location: [facility] ENDOSCOPY;  Service: Endoscopy;  Laterality: N/A;  . COLONOSCOPY WITH PROPOFOL N/A 10/19/2016   Procedure: COLONOSCOPY WITH PROPOFOL;  Surgeon: Manya Silvas, MD;  Location: [facility] ENDOSCOPY;  Service: Endoscopy;  Laterality: N/A;  . JOINT REPLACEMENT     TKR  . KNEE SURGERY Left 2011   2007  . TONSILLECTOMY  1940   ADENOIDS    Prior to Admission medications   Medication Sig Start Date End Date Taking? Authorizing Provider  ALPRAZolam Duanne Moron) 0.5 MG tablet TAKE ONE TABLET BY MOUTH 3 TIMES DAILY AS NEEDED FOR ANXIETY 10/06/18   Mar Daring, PA-C  dicyclomine (BENTYL) 20 MG tablet Take 1 tablet (20 mg total) by mouth 4 (four) times daily -  before meals and at bedtime. 01/05/19   Mar Daring, PA-C  naproxen sodium (ALEVE) 220 MG tablet  Take 220 mg by mouth daily as needed (pain).     [provider]  ondansetron (ZOFRAN ODT) 8 MG disintegrating tablet Take 1 tablet (8 mg total) by mouth every 8 (eight) hours as needed for nausea. 12/26/18   Arta Silence, MD  potassium chloride SA (K-DUR) 20 MEQ tablet TAKE ONE TABLET EVERY DAY 11/07/18   Mar Daring, PA-C  sertraline (ZOLOFT) 100 MG tablet Take 2 tablets (200 mg  total) by mouth at bedtime. 12/14/18   Mar Daring, PA-C  traZODone (DESYREL) 100 MG tablet TAKE ONE TABLET BY MOUTH AT BEDTIME 09/20/18   Mar Daring, PA-C  triamterene-hydrochlorothiazide (MAXZIDE-25) 37.5-25 MG tablet TAKE ONE TABLET BY MOUTH EVERY DAY 09/13/18   Mar Daring, PA-C    Allergies  Allergen Reactions  . Codeine     nausea    Family History  Problem Relation Age of Onset  . Cancer Mother   . Heart disease Father   . Cancer Brother   . Breast cancer Neg Hx     Social History Social History   Tobacco Use  . Smoking status: Never Smoker  . Smokeless tobacco: Never Used  Substance Use Topics  . Alcohol use: Yes    Alcohol/week: 3.0 standard drinks    Types: 3 Glasses of wine per week    Comment: OCCASIONAL  . Drug use: No    Review of Systems Constitutional: Negative for fever. ENT: Negative for recent illness/congestion Cardiovascular: Intermittent chest pain over the past few weeks or months. Respiratory: Negative for shortness of breath.  Negative for cough. Gastrointestinal: Negative for abdominal pain Musculoskeletal: Negative for musculoskeletal complaints Neurological: Negative for headache All other ROS negative  ____________________________________________   PHYSICAL EXAM:  VITAL SIGNS: ED Triage Vitals  Enc Vitals Group     BP 01/13/19 1535 (!) 181/78     Pulse Rate 01/13/19 1535 95     Resp 01/13/19 1535 18     Temp 01/13/19 1535 98.7 F (37.1 C)     Temp Source 01/13/19 1535 Oral     SpO2 01/13/19 1535 99 %     Weight 01/13/19 1536 167 lb 8.8 oz (76 kg)     Height 01/13/19 1536 5\' 5"  (1.651 m)     Head Circumference --      Peak Flow --      Pain Score 01/13/19 1536 3     Pain Loc --      Pain Edu? --      Excl. in [place]? --    Constitutional: Alert and oriented. Well appearing and in no distress. Eyes: Normal exam ENT      Head: Normocephalic and atraumatic.      Mouth/Throat: Mucous membranes are  moist. Cardiovascular: Normal rate, regular rhythm. No murmur Respiratory: Normal respiratory effort without tachypnea nor retractions. Breath sounds are clear  Gastrointestinal: Soft and nontender. No distention.   Musculoskeletal: Nontender with normal range of motion in all extremities.  Neurologic:  Normal speech and language. No gross focal neurologic deficits  Skin:  Skin is warm, dry and intact.  Psychiatric: Somewhat tearful.  ____________________________________________    EKG  EKG viewed and interpreted by myself shows a normal sinus rhythm at 94 bpm with a narrow QRS, left axis deviation, largely normal intervals with nonspecific ST changes.  ____________________________________________    RADIOLOGY  Chest x-ray is negative  ____________________________________________   INITIAL IMPRESSION / ASSESSMENT AND PLAN / ED COURSE  Pertinent labs & imaging  results that were available during my care of the patient were reviewed by me and considered in my medical decision making (see chart for details).   Patient presents to the emergency department for depression and various medical complaints such as intermittent chest pain weakness, pain in her back and legs which is chronic.  Patient states she believes most of her issues are related to worsening depression.  Patient's medical work-up including labs and cardiac enzymes, x-ray are normal, besides mild hypokalemia.  Patient was previously on potassium supplements but states she has stopped taking them.  We will restart potassium supplements..  EKG is reassuring.  We will have psychiatry evaluate the patient.  Patient agreeable to plan of care.  Patient has been seen by psychiatry.  We will discharge the patient home.  We will place the patient on potassium supplements.  Patient agreeable to plan of care.  Lamonica Trueba Dobosz was evaluated in Emergency Department on 01/13/2019 for the symptoms described in the history of present illness.  She was evaluated in the context of the global COVID-19 pandemic, which necessitated consideration that the patient might be at risk for infection with the SARS-CoV-2 virus that causes COVID-19. Institutional protocols and algorithms that pertain to the evaluation of patients at risk for COVID-19 are in a state of rapid change based on information released by regulatory bodies including the CDC and federal and state organizations. These policies and algorithms were followed during the patient's care in the ED.  ____________________________________________   FINAL CLINICAL IMPRESSION(S) / ED DIAGNOSES  Depression Hypokalemia   Harvest Dark, MD 01/13/19 1924

## 2019-01-13 NOTE — Consult Note (Signed)
[facility] Face-to-Face Psychiatry Consult   Reason for Consult:  Depression and anxiety Referring Physician:  EDP Patient Identification: Darthy Manganelli Dominik MRN:  [id number] Principal Diagnosis: Major depressive disorder, recurrent, moderate Diagnosis:  Active Problems:   Major depressive disorder, recurrent episode, moderate ([place])   Total Time spent with patient: 1 hour  Subjective:   Erionna Strum Iten is a 85 y.o. female patient admitted with chest pain.  Then, she reported depression and anxiety. Denies suicidal/homicidal ideations, hallucinations, and substance abuse.  Her son is at her bedside and augments her information.  She currently takes sertraline, Xanax (3 times a day), and Trazodone along with a multitude of medical medications.  The family was concerned she was over taking her medications as she does not see well and they found multiple bottles of several different medications.  She reports she stopped taking her sertraline a few weeks ago and started it back about two weeks ago.  She has been on her medications for 14 years. Her PCP tried her on a different medication and it did not work, could not remember the name.  Discussed going to a psychiatrist of psychiatric specialist to manage her medications and have the professional taper off the benzodiazepine and manage her depression and anxiety.  Family and patient in agreement and resources provided for psychiatrists.  Depression and anxiety are moderate 5/10 with being alone increasing her depression, socializing decreases her depression.  Anxious on assessment, pleasant and cooperative.  HPI:  Vega Stare Gumina is an 85 y.o. female who presents to the ER via her son due to concerns about her mood. Per the patient, she stopped taking her antidepressants for approximately one to two weeks. However, she started back on them approximately a week ago. Per the report of the patient's son, the other son started administering her medications for her. In the recent  past the patient fell out of her bed and family thought it was due to taking her medications wrong. When they checked her medicine cabinet, "it was medicines everywhere. Old bottles and it was just a mess. Me and my brother believe she wasn't taking the medications right. She don't want to admitted it but she can't see that good anymore. So probably was taking them wrong. One week she was taking laxatives." The patients son who currently oversee's her medications, live across the street from her.  Patient medications are prescribed by her PCP. She states she's never seen a psychiatrist or been inpatient for mental health reasons. Patient was started on the medications approximately fourteen years ago, following the passing of her husband. Per the son, the patient was the primary caregiver and his death was difficult for her.  During the interview, the patient was calm, cooperative and pleasant. She was able to provide appropriate answers to the questions. She denies history of violence and aggression. Throughout the interview, she denied SI/HI and AV/H.   Past Psychiatric History: depression, anxiety  Risk to Self:  none Risk to Others:  none Prior Inpatient Therapy:  none Prior Outpatient Therapy:  none  Past Medical History:  Past Medical History:  Diagnosis Date  . Anxiety   . Coronary artery disease   . GERD (gastroesophageal reflux disease)   . HOH (hard of hearing)   . Hyperlipidemia   . Hypertension   . Major depressive disorder     Past Surgical History:  Procedure Laterality Date  . ABDOMINAL HYSTERECTOMY  1979  . BREAST EXCISIONAL BIOPSY Left 2005   benign  .  CATARACT EXTRACTION W/PHACO Left 02/08/2018   Procedure: CATARACT EXTRACTION PHACO AND INTRAOCULAR LENS PLACEMENT (IOC);  Surgeon: Birder Robson, MD;  Location: [facility] ORS;  Service: Ophthalmology;  Laterality: Left;  [place] 00:33.4 AP% 13.9 CDE 4.66 Fluid Pack lot # [id number]  . COLONOSCOPY WITH PROPOFOL N/A 07/22/2015    Procedure: COLONOSCOPY WITH PROPOFOL;  Surgeon: Manya Silvas, MD;  Location: [facility] ENDOSCOPY;  Service: Endoscopy;  Laterality: N/A;  . COLONOSCOPY WITH PROPOFOL N/A 10/19/2016   Procedure: COLONOSCOPY WITH PROPOFOL;  Surgeon: Manya Silvas, MD;  Location: [facility] ENDOSCOPY;  Service: Endoscopy;  Laterality: N/A;  . JOINT REPLACEMENT     TKR  . KNEE SURGERY Left 2011   2007  . TONSILLECTOMY  1940   ADENOIDS   Family History:  Family History  Problem Relation Age of Onset  . Cancer Mother   . Heart disease Father   . Cancer Brother   . Breast cancer Neg Hx    Family Psychiatric  History: none Social History:  Social History   Substance and Sexual Activity  Alcohol Use Yes  . Alcohol/week: 3.0 standard drinks  . Types: 3 Glasses of wine per week   Comment: OCCASIONAL     Social History   Substance and Sexual Activity  Drug Use No    Social History   Socioeconomic History  . Marital status: Widowed    Spouse name: Not on file  . Number of children: Not on file  . Years of education: Not on file  . Highest education level: Not on file  Occupational History  . Not on file  Social Needs  . Financial resource strain: Not on file  . Food insecurity    Worry: Not on file    Inability: Not on file  . Transportation needs    Medical: Not on file    Non-medical: Not on file  Tobacco Use  . Smoking status: Never Smoker  . Smokeless tobacco: Never Used  Substance and Sexual Activity  . Alcohol use: Yes    Alcohol/week: 3.0 standard drinks    Types: 3 Glasses of wine per week    Comment: OCCASIONAL  . Drug use: No  . Sexual activity: Not on file  Lifestyle  . Physical activity    Days per week: Not on file    Minutes per session: Not on file  . Stress: Not on file  Relationships  . Social Herbalist on phone: Not on file    Gets together: Not on file    Attends religious service: Not on file    Active member of club or organization: Not on file     Attends meetings of clubs or organizations: Not on file    Relationship status: Not on file  Other Topics Concern  . Not on file  Social History Narrative  . Not on file   Additional Social History:    Allergies:   Allergies  Allergen Reactions  . Codeine     nausea    Labs:  Results for orders placed or performed during the hospital encounter of 01/13/19 (from the past 48 hour(s))  Basic metabolic panel     Status: Abnormal   Collection Time: 01/13/19  3:41 PM  Result Value Ref Range   Sodium 140 135 - 145 mmol/L   Potassium 2.7 (LL) 3.5 - 5.1 mmol/L    Comment: CRITICAL RESULT CALLED TO, READ BACK BY AND VERIFIED WITH ANGELA ROBBINS @1619  01/13/19 MJU  Chloride 107 98 - 111 mmol/L   CO2 23 22 - 32 mmol/L   Glucose, Bld 138 (H) 70 - 99 mg/dL   BUN 28 (H) 8 - 23 mg/dL   Creatinine, Ser 0.97 0.44 - 1.00 mg/dL   Calcium 8.8 (L) 8.9 - 10.3 mg/dL   GFR calc non Af Amer 54 (L) >60 mL/min   GFR calc Af Amer >60 >60 mL/min   Anion gap 10 5 - 15    Comment: Performed at [facility], [address] [place], [place] [address]  CBC     Status: None   Collection Time: 01/13/19  3:41 PM  Result Value Ref Range   WBC 5.7 4.0 - 10.5 K/uL   RBC 4.86 3.87 - 5.11 MIL/uL   Hemoglobin 14.9 12.0 - 15.0 g/dL   HCT 42.4 36.0 - 46.0 %   MCV 87.2 80.0 - 100.0 fL   MCH 30.7 26.0 - 34.0 pg   MCHC 35.1 30.0 - 36.0 g/dL   RDW 12.6 11.5 - 15.5 %   Platelets 181 150 - 400 K/uL   nRBC 0.0 0.0 - 0.2 %    Comment: Performed at [facility], [place], [place] [address]  Troponin I (High Sensitivity)     Status: None   Collection Time: 01/13/19  3:41 PM  Result Value Ref Range   Troponin I (High Sensitivity) 6 <18 ng/L    Comment: (NOTE) Elevated high sensitivity troponin I (hsTnI) values and significant  changes across serial measurements [place] suggest ACS but many other  chronic and acute conditions are known to elevate hsTnI results.  Refer to the  "Links" section for chest pain algorithms and additional  guidance. Performed at [facility], [address] [place], [place] [address]     No current facility-administered medications for this encounter.    Current Outpatient Medications  Medication Sig Dispense Refill  . ALPRAZolam (XANAX) 0.5 MG tablet TAKE ONE TABLET BY MOUTH 3 TIMES DAILY AS NEEDED FOR ANXIETY 90 tablet 5  . dicyclomine (BENTYL) 20 MG tablet Take 1 tablet (20 mg total) by mouth 4 (four) times daily -  before meals and at bedtime. 120 tablet 0  . naproxen sodium (ALEVE) 220 MG tablet Take 220 mg by mouth daily as needed (pain).     . ondansetron (ZOFRAN ODT) 8 MG disintegrating tablet Take 1 tablet (8 mg total) by mouth every 8 (eight) hours as needed for nausea. 10 tablet 0  . potassium chloride SA (K-DUR) 20 MEQ tablet TAKE ONE TABLET EVERY DAY 90 tablet 0  . sertraline (ZOLOFT) 100 MG tablet Take 2 tablets (200 mg total) by mouth at bedtime. 180 tablet 1  . traZODone (DESYREL) 100 MG tablet TAKE ONE TABLET BY MOUTH AT BEDTIME 90 tablet 3  . triamterene-hydrochlorothiazide (MAXZIDE-25) 37.5-25 MG tablet TAKE ONE TABLET BY MOUTH EVERY DAY 90 tablet 3    Musculoskeletal: Strength & Muscle Tone: within normal limits Gait & Station: normal Patient leans: N/A  Psychiatric Specialty Exam: Physical Exam  Nursing note and vitals reviewed. Constitutional: She is oriented to person, place, and time. She appears well-developed and well-nourished.  HENT:  Head: Normocephalic.  Respiratory: Effort normal.  Musculoskeletal: Normal range of motion.  Neurological: She is alert and oriented to person, place, and time.  Psychiatric: Her speech is normal and behavior is normal. Judgment and thought content normal. Her mood appears anxious. Cognition and memory are normal. She exhibits a depressed mood.  Review of Systems  Cardiovascular: Positive for chest pain.  Psychiatric/Behavioral: Positive for depression  and memory loss. The patient is nervous/anxious.   All other systems reviewed and are negative.   Blood pressure (!) 169/87, pulse 72, temperature 98.6 F (37 C), temperature source Oral, resp. rate 16, height 5\' 5"  (1.651 m), weight 76 kg, SpO2 96 %.Body mass index is 27.88 kg/m.  General Appearance: Casual  Eye Contact:  Good  Speech:  Normal Rate  Volume:  Normal  Mood:  Anxious and Depressed  Affect:  Congruent  Thought Process:  Coherent and Descriptions of Associations: Intact  Orientation:  Full (Time, Place, and Person)  Thought Content:  WDL and Logical  Suicidal Thoughts:  No  Homicidal Thoughts:  No  Memory:  Immediate;   Good Recent;   Good Remote;   Good  Judgement:  Fair  Insight:  Fair  Psychomotor Activity:  Normal  Concentration:  Concentration: Good and Attention Span: Good  Recall:  Good  Fund of Knowledge:  Good  Language:  Good  Akathisia:  No  Handed:  Right  AIMS (if indicated):     Assets:  Housing Leisure Time Resilience Social Support  ADL's:  Intact  Cognition:  WNL  Sleep:        Treatment Plan Summary: Major depressive disorder, recurrent, moderate: -Continue sertraline 200 mg daily -Resources for a psychiatrist to manage medications  Anxiety: -Continue Xanax 0.5 mg TID PRN -Recommend tapering off by a psychiatric professional due to recent falls and cognition  Insomnia: -Continue Trazodone 100 mg at bedtime PRN  Disposition: No evidence of imminent risk to self or others at present.   Patient does not meet criteria for psychiatric inpatient admission. Discussed crisis plan, support from social network, calling 911, coming to the Emergency Department, and calling Suicide Hotline.  Waylan Boga, NP 01/13/2019 6:08 PM

## 2019-01-13 NOTE — ED Notes (Signed)
Reviewed discharge instructions, follow-up care, and prescriptions with patient. Patient verbalized understanding of all information reviewed. Patient stable, with no distress noted at this time.    

## 2019-01-13 NOTE — ED Notes (Signed)
Dr. Kerman Passey made aware of Potassium of 2.7

## 2019-01-16 ENCOUNTER — Ambulatory Visit: Payer: Self-pay | Admitting: Physician Assistant

## 2019-01-16 NOTE — Progress Notes (Deleted)
       Patient: Jordan Gordon Female    DOB: [birth year]   85 y.o.   MRN: [id number] Visit Date: 01/16/2019  Today's Provider: Mar Daring, PA-C   No chief complaint on file.  Subjective:     HPI   Follow Up ER Visit  Patient is here for ER follow up.  She was recently seen at [facility] for major depressive disorder and Hypokalemia on 01/13/19 Treatment for this included evaluated by psychiatry, chest xray-Negative,labs,EKG0-normal. Re-start potassium supplements. She reports {DESC; EXCELLENT/GOOD/FAIR:19992} compliance with treatment. She reports this condition is {improved/worse/unchanged:3041574}.  ------------------------------------------------------------------------------------   Allergies  Allergen Reactions  . Codeine     nausea     Current Outpatient Medications:  .  ALPRAZolam (XANAX) 0.5 MG tablet, TAKE ONE TABLET BY MOUTH 3 TIMES DAILY AS NEEDED FOR ANXIETY, Disp: 90 tablet, Rfl: 5 .  dicyclomine (BENTYL) 20 MG tablet, Take 1 tablet (20 mg total) by mouth 4 (four) times daily -  before meals and at bedtime., Disp: 120 tablet, Rfl: 0 .  naproxen sodium (ALEVE) 220 MG tablet, Take 220 mg by mouth daily as needed (pain). , Disp: , Rfl:  .  ondansetron (ZOFRAN ODT) 8 MG disintegrating tablet, Take 1 tablet (8 mg total) by mouth every 8 (eight) hours as needed for nausea., Disp: 10 tablet, Rfl: 0 .  potassium chloride SA (KLOR-CON M15) 15 MEQ tablet, Take 1 tablet (15 mEq total) by mouth 2 (two) times daily., Disp: 14 tablet, Rfl: 0 .  sertraline (ZOLOFT) 100 MG tablet, Take 2 tablets (200 mg total) by mouth at bedtime., Disp: 180 tablet, Rfl: 1 .  traZODone (DESYREL) 100 MG tablet, TAKE ONE TABLET BY MOUTH AT BEDTIME, Disp: 90 tablet, Rfl: 3 .  triamterene-hydrochlorothiazide (MAXZIDE-25) 37.5-25 MG tablet, TAKE ONE TABLET BY MOUTH EVERY DAY, Disp: 90 tablet, Rfl: 3  Review of Systems  Social History   Tobacco Use  . Smoking status: Never Smoker  . Smokeless  tobacco: Never Used  Substance Use Topics  . Alcohol use: Yes    Alcohol/week: 3.0 standard drinks    Types: 3 Glasses of wine per week    Comment: OCCASIONAL      Objective:   There were no vitals taken for this visit. There were no vitals filed for this visit.   Physical Exam   No results found for any visits on 01/16/19.     Assessment & Arg[place], PA-C  [facility]

## 2019-01-23 ENCOUNTER — Telehealth: Payer: Self-pay | Admitting: Physician Assistant

## 2019-01-23 DIAGNOSIS — F4323 Adjustment disorder with mixed anxiety and depressed mood: Secondary | ICD-10-CM

## 2019-01-23 DIAGNOSIS — F5101 Primary insomnia: Secondary | ICD-10-CM

## 2019-01-23 DIAGNOSIS — F3341 Major depressive disorder, recurrent, in partial remission: Secondary | ICD-10-CM

## 2019-01-23 DIAGNOSIS — F419 Anxiety disorder, unspecified: Secondary | ICD-10-CM

## 2019-01-23 NOTE — Telephone Encounter (Signed)
Pt was in the ER and had a psy eval.  They referred her [facility] but when they called [facility] told thema referral from her primary had to be put in place first.  CB#  [phone]  teri

## 2019-01-23 NOTE — Telephone Encounter (Signed)
Referral placed.

## 2019-01-25 ENCOUNTER — Encounter: Payer: Self-pay | Admitting: Physician Assistant

## 2019-01-25 ENCOUNTER — Ambulatory Visit (INDEPENDENT_AMBULATORY_CARE_PROVIDER_SITE_OTHER): Payer: Medicare Other | Admitting: Physician Assistant

## 2019-01-25 DIAGNOSIS — R197 Diarrhea, unspecified: Secondary | ICD-10-CM | POA: Diagnosis not present

## 2019-01-25 DIAGNOSIS — R110 Nausea: Secondary | ICD-10-CM | POA: Diagnosis not present

## 2019-01-25 DIAGNOSIS — R42 Dizziness and giddiness: Secondary | ICD-10-CM | POA: Diagnosis not present

## 2019-01-25 MED ORDER — MECLIZINE HCL 12.5 MG PO TABS: 12.5000 mg | ORAL_TABLET | Freq: Three times a day (TID) | ORAL | 0 refills | Status: DC | PRN

## 2019-01-25 NOTE — Progress Notes (Signed)
Patient: Jordan Gordon Female    DOB: [birth year]   85 y.o.   MRN: [id number] Visit Date: 01/25/2019  Today's Provider: Mar Daring, PA-C   Chief Complaint  Patient presents with  . Dizziness  . Blood In Stools   Subjective:    Virtual Visit via Telephone Note  I connected with Chanise W Liss on 01/25/19 at 11:20 AM EDT by telephone and verified that I am speaking with the correct person using two identifiers.  Location: Patient: Home Provider: [facility]   I discussed the limitations, risks, security and privacy concerns of performing an evaluation and management service by telephone and the availability of in person appointments. I also discussed with the patient that there Schrier be a patient responsible charge related to this service. The patient expressed understanding and agreed to proceed.  Dizziness This is a new problem. The current episode started 1 to 4 weeks ago. The problem occurs every several days. The problem has been unchanged. Associated symptoms include abdominal pain, nausea and weakness. Pertinent negatives include no chills, fatigue, fever, headaches, numbness, visual change or vomiting. The symptoms are aggravated by standing. She has tried drinking for the symptoms.    Blood in stools: Patient reports that this is chronic and stool has been black for a while but she has never mentioned it. Patient is scheduled to go see GI on Tuesday at [facility].  Patient reports she is having dizziness/lightheadedness. She denies room spinning. She reports she feels more lightheaded, like she [place] pass out or fall when standing. Symptoms do not occur when sitting or lying down. She is not checking her BP at home. She reports she has good fluid intake and has been able to eat. No weight loss. She does report still having nausea. Also having long standing loose bowels. When asked how long, she replies "I don't know, a while."  She has tried Bentyl without success and reports  not noticing any improvements with zofran given from the ER on 12/26/18. She reports she is also not taking sertraline and has been taking wellbutrin XR 150mg . She also reports the "alprazolam makes everything better."  Allergies  Allergen Reactions  . Codeine     nausea     Current Outpatient Medications:  .  ALPRAZolam (XANAX) 0.5 MG tablet, TAKE ONE TABLET BY MOUTH 3 TIMES DAILY AS NEEDED FOR ANXIETY, Disp: 90 tablet, Rfl: 5 .  buPROPion (WELLBUTRIN XL) 150 MG 24 hr tablet, , Disp: , Rfl:  .  potassium chloride SA (KLOR-CON M15) 15 MEQ tablet, Take 1 tablet (15 mEq total) by mouth 2 (two) times daily., Disp: 14 tablet, Rfl: 0 .  traZODone (DESYREL) 100 MG tablet, TAKE ONE TABLET BY MOUTH AT BEDTIME, Disp: 90 tablet, Rfl: 3 .  triamterene-hydrochlorothiazide (MAXZIDE-25) 37.5-25 MG tablet, TAKE ONE TABLET BY MOUTH EVERY DAY, Disp: 90 tablet, Rfl: 3 .  dicyclomine (BENTYL) 20 MG tablet, Take 1 tablet (20 mg total) by mouth 4 (four) times daily -  before meals and at bedtime. (Patient not taking: Reported on 01/25/2019), Disp: 120 tablet, Rfl: 0 .  naproxen sodium (ALEVE) 220 MG tablet, Take 220 mg by mouth daily as needed (pain). , Disp: , Rfl:  .  ondansetron (ZOFRAN ODT) 8 MG disintegrating tablet, Take 1 tablet (8 mg total) by mouth every 8 (eight) hours as needed for nausea. (Patient not taking: Reported on 01/25/2019), Disp: 10 tablet, Rfl: 0 .  sertraline (ZOLOFT) 100 MG tablet,  Take 2 tablets (200 mg total) by mouth at bedtime. (Patient not taking: Reported on 01/25/2019), Disp: 180 tablet, Rfl: 1  Review of Systems  Constitutional: Negative for chills, fatigue and fever.  HENT: Negative.   Respiratory: Negative.   Cardiovascular: Negative.   Gastrointestinal: Positive for abdominal pain, diarrhea and nausea. Negative for abdominal distention, anal bleeding, blood in stool, constipation, rectal pain and vomiting.  Genitourinary: Negative.  Negative for dysuria, flank pain and frequency.   Neurological: Positive for weakness and light-headedness. Negative for dizziness, numbness and headaches.    Social History   Tobacco Use  . Smoking status: Never Smoker  . Smokeless tobacco: Never Used  Substance Use Topics  . Alcohol use: Yes    Alcohol/week: 3.0 standard drinks    Types: 3 Glasses of wine per week    Comment: OCCASIONAL      Objective:   There were no vitals taken for this visit. There were no vitals filed for this visit.   Physical Exam Constitutional:      General: She is not in acute distress. Pulmonary:     Effort: No respiratory distress.  Neurological:     Mental Status: She is alert.      No results found for any visits on 01/25/19.     Assessment & Plan     1. Lightheadedness Postural per patient. Will give meclizine as below. Advised to make sure she is drinking plenty of fluids. Also she has h/o hypokalemia. She reports taking potassium supplement. Advised Mcvey need to check labs if symptoms continue to see if potassium still low. Duprey require [place] stopping/changing Maxzide if still low. She voiced understanding. Call if worsening or not improving.  - meclizine (ANTIVERT) 12.5 MG tablet; Take 1 tablet (12.5 mg total) by mouth 3 (three) times daily as needed for dizziness.  Dispense: 30 tablet; Refill: 0  2. Nausea Failed Zofran. See above medical treatment plan.  - meclizine (ANTIVERT) 12.5 MG tablet; Take 1 tablet (12.5 mg total) by mouth 3 (three) times daily as needed for dizziness.  Dispense: 30 tablet; Refill: 0  3. Diarrhea, unspecified type Longstanding per patient. Has appt next Tuesday, 01/31/19, with gastroenterology.    I discussed the assessment and treatment plan with the patient. The patient was provided an opportunity to ask questions and all were answered. The patient agreed with the plan and demonstrated an understanding of the instructions.   The patient was advised to call back or seek an in-person evaluation if the  symptoms worsen or if the condition fails to improve as anticipated.  I provided 14 minutes of non-face-to-face time during this encounter.    Mar Daring, PA-C  [facility]

## 2019-01-31 DIAGNOSIS — Z800 Family history of malignant neoplasm of digestive organs: Secondary | ICD-10-CM | POA: Diagnosis not present

## 2019-01-31 DIAGNOSIS — K5900 Constipation, unspecified: Secondary | ICD-10-CM | POA: Diagnosis not present

## 2019-01-31 DIAGNOSIS — R1032 Left lower quadrant pain: Secondary | ICD-10-CM | POA: Diagnosis not present

## 2019-01-31 DIAGNOSIS — R198 Other specified symptoms and signs involving the digestive system and abdomen: Secondary | ICD-10-CM | POA: Diagnosis not present

## 2019-01-31 DIAGNOSIS — K5909 Other constipation: Secondary | ICD-10-CM | POA: Diagnosis not present

## 2019-01-31 DIAGNOSIS — R1031 Right lower quadrant pain: Secondary | ICD-10-CM | POA: Diagnosis not present

## 2019-01-31 DIAGNOSIS — Z86010 Personal history of colonic polyps: Secondary | ICD-10-CM | POA: Diagnosis not present

## 2019-02-01 ENCOUNTER — Telehealth: Payer: Self-pay

## 2019-02-01 NOTE — Telephone Encounter (Signed)
Patient calling that she passed out today for a few second at [employer] and that two mens picked her up and that her feet where just giving out this was 3 hours ago and reports that she didn't hurt herself in any way. The only thing she hurts was her "feeling" She is asking for something for constipation? She reports that her back hurts and stomach. She wants something for pain. Advised patient to drink plenty of fluids and that if she gets like this again she needs to go to the ED. She denies chest pain, feeling light headed and just kept asking for pain meds.

## 2019-02-01 NOTE — Telephone Encounter (Signed)
I feel she should come in for evaluation. Can we please schedule her 40 minutes. I am concerned about her and the changes going on recently.

## 2019-02-01 NOTE — Telephone Encounter (Signed)
Tried to call patient to schedule appointment. No answer, no voicemail

## 2019-02-02 NOTE — Telephone Encounter (Signed)
lmtcb to schedule appt 

## 2019-02-03 ENCOUNTER — Other Ambulatory Visit: Payer: Self-pay | Admitting: Physician Assistant

## 2019-02-03 DIAGNOSIS — R110 Nausea: Secondary | ICD-10-CM

## 2019-02-03 DIAGNOSIS — R42 Dizziness and giddiness: Secondary | ICD-10-CM

## 2019-02-04 ENCOUNTER — Other Ambulatory Visit: Payer: Self-pay | Admitting: Physician Assistant

## 2019-02-04 DIAGNOSIS — R110 Nausea: Secondary | ICD-10-CM

## 2019-02-04 DIAGNOSIS — R42 Dizziness and giddiness: Secondary | ICD-10-CM

## 2019-02-06 ENCOUNTER — Ambulatory Visit: Payer: Self-pay | Admitting: Physician Assistant

## 2019-02-20 NOTE — Progress Notes (Signed)
Patient: Jordan Gordon Female    DOB: 1934/05/19   85 y.o.   MRN: [id number] Visit Date: 02/21/2019  Today's Provider: Mar Daring, PA-C   Chief Complaint  Patient presents with   Follow-up   Hypertension   Hyperlipidemia   Anxiety   Depression   Subjective:     HPI    Hypertension, follow-up:  BP Readings from Last 3 Encounters:  02/21/19 (!) 158/76  01/13/19 (!) 177/84  12/26/18 (!) 168/90    She was last seen for hypertension 05/05/2017.  BP at that visit was 150/76. Management since that visit includes; labs checked, no changes.She reports good compliance with treatment. She is not having side effects. none She is not exercising. She is not adherent to low salt diet.   Outside blood pressures are not checking. She is experiencing none.  Patient denies none.   Cardiovascular risk factors include advanced age (older than 79 for men, 63 for women) and hypertension.  Use of agents associated with hypertension: none.   ------------------------------------------------------------------------ Patient complains of continued dizziness and off balance. She reports she has fallen a few times but denies injury. She thinks it is due to her vision. She reports she cannot see and everything is blurry. She is scheduled to have a laser surgery on her eyes next Wednesday, 03/01/19.   She has also had some confusion/memory loss episodes and was staying with her sister. At one point her son even took her medications and was giving them to her as prescribed because it was felt she was not taking them correctly. She again blamed not taking them correctly because she could not read the labels or see the pills. Now that he is filling a pill box for her she is taking them correctly.   She also reports her anxiety and depression are better. She had acute anxiety with worrying about her grandson starting at [place] point. He has been doing well and she feels more at ease and  proud now. She is currently taking wellbutrin XL 150mg . She was on alprazolam 0.5mg  TID.   -------------------------------------------------------------   Allergies  Allergen Reactions   Codeine     nausea     Current Outpatient Medications:    ALPRAZolam (XANAX) 0.5 MG tablet, TAKE ONE TABLET BY MOUTH 3 TIMES DAILY AS NEEDED FOR ANXIETY, Disp: 90 tablet, Rfl: 5   buPROPion (WELLBUTRIN XL) 150 MG 24 hr tablet, , Disp: , Rfl:    meclizine (ANTIVERT) 12.5 MG tablet, Take 1 tablet (12.5 mg total) by mouth 3 (three) times daily as needed for dizziness., Disp: 30 tablet, Rfl: 0   naproxen sodium (ALEVE) 220 MG tablet, Take 220 mg by mouth daily as needed (pain). , Disp: , Rfl:    potassium chloride SA (KLOR-CON M15) 15 MEQ tablet, Take 1 tablet (15 mEq total) by mouth 2 (two) times daily., Disp: 14 tablet, Rfl: 0   traZODone (DESYREL) 100 MG tablet, TAKE ONE TABLET BY MOUTH AT BEDTIME, Disp: 90 tablet, Rfl: 3   triamterene-hydrochlorothiazide (MAXZIDE-25) 37.5-25 MG tablet, TAKE ONE TABLET BY MOUTH EVERY DAY, Disp: 90 tablet, Rfl: 3  Review of Systems  Constitutional: Negative for appetite change, chills, fatigue and fever.  Eyes: Positive for visual disturbance.  Respiratory: Negative for chest tightness and shortness of breath.   Cardiovascular: Negative for chest pain and palpitations.  Gastrointestinal: Negative for abdominal pain, nausea and vomiting.  Neurological: Positive for dizziness. Negative for weakness and numbness.  Psychiatric/Behavioral:  Negative for dysphoric mood. The patient is not nervous/anxious.     Social History   Tobacco Use   Smoking status: Never Smoker   Smokeless tobacco: Never Used  Substance Use Topics   Alcohol use: Yes    Alcohol/week: 3.0 standard drinks    Types: 3 Glasses of wine per week    Comment: OCCASIONAL      Objective:   BP (!) 158/76 (BP Location: Left Arm, Patient Position: Sitting, Cuff Size: Large)    Pulse 76    Temp  (!) 96.8 F (36 C) (Other (Comment))    Resp 16    Ht 5\' 7"  (1.702 m)    Wt 161 lb (73 kg)    SpO2 98%    BMI 25.22 kg/m  Vitals:   02/21/19 1510  BP: (!) 158/76  Pulse: 76  Resp: 16  Temp: (!) 96.8 F (36 C)  TempSrc: Other (Comment)  SpO2: 98%  Weight: 161 lb (73 kg)  Height: 5\' 7"  (1.702 m)  Body mass index is 25.22 kg/m.   Physical Exam Vitals signs reviewed.  Constitutional:      General: She is not in acute distress.    Appearance: Normal appearance. She is well-developed. She is not ill-appearing or diaphoretic.  Neck:     Musculoskeletal: Normal range of motion and neck supple.  Cardiovascular:     Rate and Rhythm: Normal rate and regular rhythm.     Pulses: Normal pulses.     Heart sounds: Normal heart sounds. No murmur. No friction rub. No gallop.   Pulmonary:     Effort: Pulmonary effort is normal. No respiratory distress.     Breath sounds: Normal breath sounds. No wheezing or rales.  Neurological:     Mental Status: She is alert.      No results found for any visits on 02/21/19.     Assessment & Plan    1. Lightheadedness Unsure of cause. Patient feels it is due to her poor vision. I am worried there [place] be a more central issue going on such as dementia, old CVA, or mass. Will give meclizine as below but advised of drowsiness precautions. Have ordered MRI as below. I will f/u pending results.  - meclizine (ANTIVERT) 12.5 MG tablet; Take 1 tablet (12.5 mg total) by mouth 3 (three) times daily as needed for dizziness.  Dispense: 30 tablet; Refill: 0  2. Nausea See above medical treatment plan. - meclizine (ANTIVERT) 12.5 MG tablet; Take 1 tablet (12.5 mg total) by mouth 3 (three) times daily as needed for dizziness.  Dispense: 30 tablet; Refill: 0  3. Essential hypertension BP elevated today and has been elevated. Will start Losartan as below. Continue maxzide for now also. I will see her back in 6 weeks.  - losartan (COZAAR) 50 MG tablet; Take 1 tablet  (50 mg total) by mouth daily.  Dispense: 90 tablet; Refill: 3  4. Hypokalemia H/O this. Continue potassium for now. Will check labs as below and f/u pending results. - CBC w/Diff/Platelet - Basic Metabolic Panel (BMET) - potassium chloride SA (KLOR-CON M15) 15 MEQ tablet; Take 1 tablet (15 mEq total) by mouth 2 (two) times daily.  Dispense: 180 tablet; Refill: 1  5. Elevated glucose Diet controlled. Will check labs as below and f/u pending results. - HgB A1c  6. Anxiety Decrease alprazolam to 0.25mg  from 0.5mg  due to patient feeling off balance and having frequent falls. Continue Wellbutrin.  - buPROPion (WELLBUTRIN XL) 150 MG 24  hr tablet; Take 1 tablet (150 mg total) by mouth daily.  Dispense: 90 tablet; Refill: 1 - ALPRAZolam (XANAX) 0.25 MG tablet; Take 1 tablet (0.25 mg total) by mouth 3 (three) times daily as needed for anxiety.  Dispense: 90 tablet; Refill: 5  7. Alzheimer's disease, unspecified (CODE) ([place]) Possible. Will get MRI as below. I will f/u pending results. - MR Brain W Wo Contrast; Future  8. Memory loss There has been some change in the last 2-3 months in the patient's personality and memory. She just seems slightly confused and does not remember things told to her in appointments like she previously did. Also had acute episode in July that required hospital evaluation. Will get MRI as below and f/u pending results.  - MR Brain W Wo Contrast; Future  9. Other peripheral vertigo, unspecified ear See above medical treatment plan. - MR Brain W Wo Contrast; Future  10. Need for influenza vaccination Flu vaccine given today without complication. Patient sat upright for 15 minutes to check for adverse reaction before being released. - Flu Vaccine QUAD High Dose(Fluad)     Mar Daring, PA-C  [facility]

## 2019-02-21 ENCOUNTER — Ambulatory Visit (INDEPENDENT_AMBULATORY_CARE_PROVIDER_SITE_OTHER): Payer: Medicare Other | Admitting: Physician Assistant

## 2019-02-21 ENCOUNTER — Other Ambulatory Visit: Payer: Self-pay

## 2019-02-21 ENCOUNTER — Encounter: Payer: Self-pay | Admitting: Physician Assistant

## 2019-02-21 VITALS — BP 158/76 | HR 76 | Temp 96.8°F | Resp 16 | Ht 67.0 in | Wt 161.0 lb

## 2019-02-21 DIAGNOSIS — I10 Essential (primary) hypertension: Secondary | ICD-10-CM

## 2019-02-21 DIAGNOSIS — R42 Dizziness and giddiness: Secondary | ICD-10-CM

## 2019-02-21 DIAGNOSIS — E876 Hypokalemia: Secondary | ICD-10-CM

## 2019-02-21 DIAGNOSIS — R7309 Other abnormal glucose: Secondary | ICD-10-CM | POA: Diagnosis not present

## 2019-02-21 DIAGNOSIS — F419 Anxiety disorder, unspecified: Secondary | ICD-10-CM | POA: Diagnosis not present

## 2019-02-21 DIAGNOSIS — Z23 Encounter for immunization: Secondary | ICD-10-CM | POA: Diagnosis not present

## 2019-02-21 DIAGNOSIS — R110 Nausea: Secondary | ICD-10-CM

## 2019-02-21 DIAGNOSIS — G309 Alzheimer's disease, unspecified: Secondary | ICD-10-CM

## 2019-02-21 DIAGNOSIS — H81399 Other peripheral vertigo, unspecified ear: Secondary | ICD-10-CM | POA: Diagnosis not present

## 2019-02-21 DIAGNOSIS — R413 Other amnesia: Secondary | ICD-10-CM

## 2019-02-21 MED ORDER — BUPROPION HCL ER (XL) 150 MG PO TB24: 150.0000 mg | ORAL_TABLET | Freq: Every day | ORAL | 1 refills | Status: DC

## 2019-02-21 MED ORDER — LOSARTAN POTASSIUM 50 MG PO TABS: 50.0000 mg | ORAL_TABLET | Freq: Every day | ORAL | 3 refills | Status: DC

## 2019-02-21 MED ORDER — ALPRAZOLAM 0.25 MG PO TABS: 0.2500 mg | ORAL_TABLET | Freq: Three times a day (TID) | ORAL | 5 refills | Status: DC | PRN

## 2019-02-21 MED ORDER — MECLIZINE HCL 12.5 MG PO TABS: 12.5000 mg | ORAL_TABLET | Freq: Three times a day (TID) | ORAL | 0 refills | Status: DC | PRN

## 2019-02-21 MED ORDER — POTASSIUM CHLORIDE CRYS ER 15 MEQ PO TBCR: 15.0000 meq | EXTENDED_RELEASE_TABLET | Freq: Two times a day (BID) | ORAL | 1 refills | Status: DC

## 2019-02-21 NOTE — Patient Instructions (Signed)
Miralax 1 capful daily for constipation

## 2019-02-22 ENCOUNTER — Telehealth: Payer: Self-pay

## 2019-02-22 LAB — BASIC METABOLIC PANEL
BUN/Creatinine Ratio: 21 (ref 12–28)
BUN: 22 mg/dL (ref 8–27)
CO2: 26 mmol/L (ref 20–29)
Calcium: 9.9 mg/dL (ref 8.7–10.3)
Chloride: 105 mmol/L (ref 96–106)
Creatinine, Ser: 1.03 mg/dL — ABNORMAL HIGH (ref 0.57–1.00)
GFR calc Af Amer: 58 mL/min/{1.73_m2} — ABNORMAL LOW (ref >59)
GFR calc non Af Amer: 50 mL/min/{1.73_m2} — ABNORMAL LOW (ref >59)
Glucose: 86 mg/dL (ref 65–99)
Potassium: 4 mmol/L (ref 3.5–5.2)
Sodium: 143 mmol/L (ref 134–144)

## 2019-02-22 LAB — CBC WITH DIFFERENTIAL/PLATELET
Basophils Absolute: 0 10*3/uL (ref 0.0–0.2)
Basos: 0 %
EOS (ABSOLUTE): 0.1 10*3/uL (ref 0.0–0.4)
Eos: 2 %
Hematocrit: 40.3 % (ref 34.0–46.6)
Hemoglobin: 14.2 g/dL (ref 11.1–15.9)
Immature Grans (Abs): 0 10*3/uL (ref 0.0–0.1)
Immature Granulocytes: 0 %
Lymphocytes Absolute: 1.4 10*3/uL (ref 0.7–3.1)
Lymphs: 28 %
MCH: 31.4 pg (ref 26.6–33.0)
MCHC: 35.2 g/dL (ref 31.5–35.7)
MCV: 89 fL (ref 79–97)
Monocytes Absolute: 0.4 10*3/uL (ref 0.1–0.9)
Monocytes: 7 %
Neutrophils Absolute: 3.1 10*3/uL (ref 1.4–7.0)
Neutrophils: 63 %
Platelets: 208 10*3/uL (ref 150–450)
RBC: 4.52 x10E6/uL (ref 3.77–5.28)
RDW: 13.5 % (ref 11.7–15.4)
WBC: 5 10*3/uL (ref 3.4–10.8)

## 2019-02-22 LAB — HEMOGLOBIN A1C
Est. average glucose Bld gHb Est-mCnc: 105 mg/dL
Hgb A1c MFr Bld: 5.3 % (ref 4.8–5.6)

## 2019-02-22 NOTE — Telephone Encounter (Signed)
-----   Message from Mar Daring, PA-C sent at 02/22/2019  8:09 AM EDT ----- Potassium is normal. Continue potassium as you have been taking it. All other labs are normal also, including sugar.

## 2019-02-22 NOTE — Telephone Encounter (Signed)
No answer/busy tone

## 2019-02-27 NOTE — Telephone Encounter (Signed)
Pt cancelled her appt for the MRI of the brain that schd for the 28th  Jordan Gordon

## 2019-02-27 NOTE — Telephone Encounter (Signed)
Noted  

## 2019-02-27 NOTE — Telephone Encounter (Signed)
FYI

## 2019-03-01 DIAGNOSIS — H401131 Primary open-angle glaucoma, bilateral, mild stage: Secondary | ICD-10-CM | POA: Diagnosis not present

## 2019-03-06 ENCOUNTER — Ambulatory Visit: Payer: Medicare Other

## 2019-03-13 DIAGNOSIS — H353123 Nonexudative age-related macular degeneration, left eye, advanced atrophic without subfoveal involvement: Secondary | ICD-10-CM | POA: Diagnosis not present

## 2019-03-13 DIAGNOSIS — H25811 Combined forms of age-related cataract, right eye: Secondary | ICD-10-CM | POA: Diagnosis not present

## 2019-03-13 DIAGNOSIS — H52223 Regular astigmatism, bilateral: Secondary | ICD-10-CM | POA: Diagnosis not present

## 2019-03-13 DIAGNOSIS — H401130 Primary open-angle glaucoma, bilateral, stage unspecified: Secondary | ICD-10-CM | POA: Diagnosis not present

## 2019-03-13 DIAGNOSIS — H43813 Vitreous degeneration, bilateral: Secondary | ICD-10-CM | POA: Diagnosis not present

## 2019-03-13 DIAGNOSIS — Z961 Presence of intraocular lens: Secondary | ICD-10-CM | POA: Diagnosis not present

## 2019-03-13 DIAGNOSIS — H5203 Hypermetropia, bilateral: Secondary | ICD-10-CM | POA: Diagnosis not present

## 2019-03-13 DIAGNOSIS — H353212 Exudative age-related macular degeneration, right eye, with inactive choroidal neovascularization: Secondary | ICD-10-CM | POA: Diagnosis not present

## 2019-03-13 DIAGNOSIS — H548 Legal blindness, as defined in USA: Secondary | ICD-10-CM | POA: Diagnosis not present

## 2019-03-13 DIAGNOSIS — H524 Presbyopia: Secondary | ICD-10-CM | POA: Diagnosis not present

## 2019-03-15 ENCOUNTER — Telehealth: Payer: Self-pay | Admitting: Physician Assistant

## 2019-03-15 DIAGNOSIS — M545 Low back pain, unspecified: Secondary | ICD-10-CM

## 2019-03-15 MED ORDER — MELOXICAM 7.5 MG PO TABS: 7.5000 mg | ORAL_TABLET | Freq: Every day | ORAL | 0 refills | Status: DC

## 2019-03-15 NOTE — Telephone Encounter (Signed)
Please Review

## 2019-03-15 NOTE — Telephone Encounter (Signed)
Patient advised as directed below. 

## 2019-03-15 NOTE — Telephone Encounter (Signed)
Meloxicam sent in. Take with food in the morning. Can only take tylenol with this medication, no other NSAIDs (aleve/naproxen, motrin/advil/ibuprofen).

## 2019-03-15 NOTE — Telephone Encounter (Signed)
Pt says she is having arthritic pain in her back and she would like to know if Jordan Gordon could send something to the pharmacy for her  [facility]  CB#  [phone]  [place]

## 2019-03-24 DIAGNOSIS — H401134 Primary open-angle glaucoma, bilateral, indeterminate stage: Secondary | ICD-10-CM | POA: Diagnosis not present

## 2019-03-24 DIAGNOSIS — H548 Legal blindness, as defined in USA: Secondary | ICD-10-CM | POA: Diagnosis not present

## 2019-03-29 ENCOUNTER — Telehealth: Payer: Self-pay | Admitting: Physician Assistant

## 2019-03-29 NOTE — Telephone Encounter (Signed)
Pt called and stated she didn't remember [name] referring her to psychiatry.  States she is not going and didn't like someone scheduled her an appt.   Pt states she cxed her appt and didn't want to go.

## 2019-03-29 NOTE — Telephone Encounter (Signed)
I received note from referral coordinator

## 2019-03-29 NOTE — Telephone Encounter (Signed)
Please Review

## 2019-03-31 ENCOUNTER — Other Ambulatory Visit: Admission: RE | Admit: 2019-03-31 | Payer: Medicare Other | Source: Ambulatory Visit

## 2019-04-05 ENCOUNTER — Ambulatory Visit: Admit: 2019-04-05 | Payer: Medicare Other | Admitting: Internal Medicine

## 2019-04-05 SURGERY — COLONOSCOPY WITH PROPOFOL
Anesthesia: General

## 2019-04-06 ENCOUNTER — Other Ambulatory Visit: Payer: Self-pay | Admitting: Physician Assistant

## 2019-04-06 DIAGNOSIS — F419 Anxiety disorder, unspecified: Secondary | ICD-10-CM

## 2019-04-11 ENCOUNTER — Ambulatory Visit: Payer: Medicare Other | Admitting: Psychiatry

## 2019-04-21 DIAGNOSIS — H401134 Primary open-angle glaucoma, bilateral, indeterminate stage: Secondary | ICD-10-CM | POA: Diagnosis not present

## 2019-04-28 ENCOUNTER — Ambulatory Visit: Payer: Medicare Other | Admitting: Physician Assistant

## 2019-04-28 ENCOUNTER — Encounter: Payer: Self-pay | Admitting: Physician Assistant

## 2019-04-28 ENCOUNTER — Ambulatory Visit (INDEPENDENT_AMBULATORY_CARE_PROVIDER_SITE_OTHER): Payer: Medicare Other | Admitting: Physician Assistant

## 2019-04-28 DIAGNOSIS — F339 Major depressive disorder, recurrent, unspecified: Secondary | ICD-10-CM

## 2019-04-28 MED ORDER — BUPROPION HCL ER (XL) 300 MG PO TB24: 300.0000 mg | ORAL_TABLET | Freq: Every day | ORAL | 1 refills | Status: DC

## 2019-04-28 NOTE — Progress Notes (Signed)
Patient: Jordan Gordon Female    DOB: September 23, 1933   85 y.o.   MRN: [id number] Visit Date: 04/28/2019  Today's Provider: Mar Daring, PA-C   No chief complaint on file.  Subjective:     Virtual Visit via Telephone Note  I connected with Jordan Gordon on 04/28/19 at  1:20 PM EST by telephone and verified that I am speaking with the correct person using two identifiers.  Location: Patient: Home  Provider: [facility]   I discussed the limitations, risks, security and privacy concerns of performing an evaluation and management service by telephone and the availability of in person appointments. I also discussed with the patient that there [place] be a patient responsible charge related to this service. The patient expressed understanding and agreed to proceed.   HPI   Patient is wanting to increase her wellbutrin. She reports it is doing well, but with Covid-19 on the rise again and her having to stay in again she is feeling more anxious and depressed.   Allergies  Allergen Reactions  . Codeine     nausea     Current Outpatient Medications:  .  ALPRAZolam (XANAX) 0.25 MG tablet, Take 1 tablet (0.25 mg total) by mouth 3 (three) times daily as needed for anxiety., Disp: 90 tablet, Rfl: 5 .  buPROPion (WELLBUTRIN XL) 150 MG 24 hr tablet, Take 1 tablet (150 mg total) by mouth daily., Disp: 90 tablet, Rfl: 1 .  losartan (COZAAR) 50 MG tablet, Take 1 tablet (50 mg total) by mouth daily., Disp: 90 tablet, Rfl: 3 .  meclizine (ANTIVERT) 12.5 MG tablet, Take 1 tablet (12.5 mg total) by mouth 3 (three) times daily as needed for dizziness., Disp: 30 tablet, Rfl: 0 .  meloxicam (MOBIC) 7.5 MG tablet, Take 1 tablet (7.5 mg total) by mouth daily., Disp: 30 tablet, Rfl: 0 .  potassium chloride SA (KLOR-CON M15) 15 MEQ tablet, Take 1 tablet (15 mEq total) by mouth 2 (two) times daily., Disp: 180 tablet, Rfl: 1 .  traZODone (DESYREL) 100 MG tablet, TAKE ONE TABLET BY MOUTH AT BEDTIME, Disp: 90  tablet, Rfl: 3 .  triamterene-hydrochlorothiazide (MAXZIDE-25) 37.5-25 MG tablet, TAKE ONE TABLET BY MOUTH EVERY DAY, Disp: 90 tablet, Rfl: 3  Review of Systems  Constitutional: Negative.   Respiratory: Negative.   Cardiovascular: Negative.   Neurological: Negative.   Psychiatric/Behavioral: Positive for dysphoric mood. The patient is nervous/anxious.     Social History   Tobacco Use  . Smoking status: Never Smoker  . Smokeless tobacco: Never Used  Substance Use Topics  . Alcohol use: Yes    Alcohol/week: 3.0 standard drinks    Types: 3 Glasses of wine per week    Comment: OCCASIONAL      Objective:   There were no vitals taken for this visit. There were no vitals filed for this visit.There is no height or weight on file to calculate BMI.   Physical Exam Vitals signs reviewed.  Constitutional:      General: She is not in acute distress. Pulmonary:     Effort: No respiratory distress.  Neurological:     Mental Status: She is alert.      No results found for any visits on 04/28/19.     Assessment & Plan     1. Depression, recurrent ([place]) Increase wellbutrin to 300mg  as below. Johnsey take 2-150mg  XR that she has at home until she runs out then she can start the new  single dose as below. Call if still worsening or other symptoms develop.  - buPROPion (WELLBUTRIN XL) 300 MG 24 hr tablet; Take 1 tablet (300 mg total) by mouth daily.  Dispense: 90 tablet; Refill: 1   I discussed the assessment and treatment plan with the patient. The patient was provided an opportunity to ask questions and all were answered. The patient agreed with the plan and demonstrated an understanding of the instructions.   The patient was advised to call back or seek an in-person evaluation if the symptoms worsen or if the condition fails to improve as anticipated.  I provided 10 minutes of non-face-to-face time during this encounter.    Mar Daring, PA-C  [facility]

## 2019-06-23 DIAGNOSIS — H401133 Primary open-angle glaucoma, bilateral, severe stage: Secondary | ICD-10-CM | POA: Diagnosis not present

## 2019-07-21 DIAGNOSIS — H401133 Primary open-angle glaucoma, bilateral, severe stage: Secondary | ICD-10-CM | POA: Diagnosis not present

## 2019-08-07 ENCOUNTER — Other Ambulatory Visit: Payer: Self-pay | Admitting: Physician Assistant

## 2019-08-07 DIAGNOSIS — F339 Major depressive disorder, recurrent, unspecified: Secondary | ICD-10-CM

## 2019-08-07 NOTE — Telephone Encounter (Signed)
Requested Prescriptions  Pending Prescriptions Disp Refills  . buPROPion (WELLBUTRIN XL) 300 MG 24 hr tablet [Pharmacy Med Name: BUPROPION HCL ER (XL) 300 MG TAB] 90 tablet 1    Sig: TAKE ONE TABLET EVERY DAY     Psychiatry: Antidepressants - bupropion Failed - 08/07/2019  1:39 PM      Failed - Last BP in normal range    BP Readings from Last 1 Encounters:  02/21/19 (!) 158/76         Passed - Completed PHQ-2 or PHQ-9 in the last 360 days.      Passed - Valid encounter within last 6 months    Recent Outpatient Visits          3 months ago Depression, recurrent [facility])   [place], Clearnce Sorrel, [place]   5 months ago [facility], Clearnce Sorrel, [place]   6 months ago [place], PA-C   7 months ago Colon cancer screening   [place], [place]   7 months ago Depression, recurrent [facility])   [place], [place], [place]

## 2019-08-17 ENCOUNTER — Telehealth: Payer: Self-pay

## 2019-08-17 ENCOUNTER — Encounter: Payer: Self-pay | Admitting: Physician Assistant

## 2019-08-17 ENCOUNTER — Ambulatory Visit (INDEPENDENT_AMBULATORY_CARE_PROVIDER_SITE_OTHER): Payer: PPO | Admitting: Physician Assistant

## 2019-08-17 DIAGNOSIS — F320 Major depressive disorder, single episode, mild: Secondary | ICD-10-CM | POA: Diagnosis not present

## 2019-08-17 DIAGNOSIS — G8929 Other chronic pain: Secondary | ICD-10-CM | POA: Diagnosis not present

## 2019-08-17 DIAGNOSIS — M545 Low back pain, unspecified: Secondary | ICD-10-CM

## 2019-08-17 MED ORDER — MELOXICAM 7.5 MG PO TABS: 7.5000 mg | ORAL_TABLET | Freq: Every day | ORAL | 1 refills | Status: DC

## 2019-08-17 MED ORDER — BUPROPION HCL ER (XL) 150 MG PO TB24: 150.0000 mg | ORAL_TABLET | Freq: Every day | ORAL | 1 refills | Status: DC

## 2019-08-17 NOTE — Progress Notes (Signed)
Patient: Jordan Gordon Female    DOB: 05-Jul-1933   85 y.o.   MRN: [id number] Visit Date: 08/17/2019  Today's Provider: Mar Daring, PA-C   Chief Complaint  Patient presents with  . Depression  . Back Pain   Subjective:     Virtual Visit via Telephone Note  I connected with Jerita W Goldblatt on 08/17/19 at  5:20 PM EST by telephone and verified that I am speaking with the correct person using two identifiers.  Location: Patient: Home Provider: [facility]   I discussed the limitations, risks, security and privacy concerns of performing an evaluation and management service by telephone and the availability of in person appointments. I also discussed with the patient that there [place] be a patient responsible charge related to this service. The patient expressed understanding and agreed to proceed.  HPI  Back Pain: This is a chronic issues. She is asking if something different can be sent in for her.  Depression: She wants to know if the provider can increase her antidepressant medicine. She reports that her depression is not really bad but feels that she needs an increase on her medication.  Depression screen [facility] 2/9 08/17/2019  Decreased Interest 3  Down, Depressed, Hopeless 3  PHQ - 2 Score 6  Altered sleeping 0  Tired, decreased energy 3  Change in appetite 0  Feeling bad or failure about yourself  0  Trouble concentrating 0  Moving slowly or fidgety/restless 0  Suicidal thoughts 0  PHQ-9 Score 9  Difficult doing work/chores Somewhat difficult  Some recent data might be hidden    Allergies  Allergen Reactions  . Codeine     nausea     Current Outpatient Medications:  .  ALPRAZolam (XANAX) 0.25 MG tablet, Take 1 tablet (0.25 mg total) by mouth 3 (three) times daily as needed for anxiety., Disp: 90 tablet, Rfl: 5 .  buPROPion (WELLBUTRIN XL) 300 MG 24 hr tablet, TAKE ONE TABLET EVERY DAY, Disp: 90 tablet, Rfl: 1 .  losartan (COZAAR) 50 MG tablet, Take 1 tablet (50 mg  total) by mouth daily., Disp: 90 tablet, Rfl: 3 .  meloxicam (MOBIC) 7.5 MG tablet, Take 1 tablet (7.5 mg total) by mouth daily., Disp: 30 tablet, Rfl: 0 .  potassium chloride SA (KLOR-CON M15) 15 MEQ tablet, Take 1 tablet (15 mEq total) by mouth 2 (two) times daily., Disp: 180 tablet, Rfl: 1 .  traZODone (DESYREL) 100 MG tablet, TAKE ONE TABLET BY MOUTH AT BEDTIME, Disp: 90 tablet, Rfl: 3 .  triamterene-hydrochlorothiazide (MAXZIDE-25) 37.5-25 MG tablet, TAKE ONE TABLET BY MOUTH EVERY DAY, Disp: 90 tablet, Rfl: 3 .  meclizine (ANTIVERT) 12.5 MG tablet, Take 1 tablet (12.5 mg total) by mouth 3 (three) times daily as needed for dizziness. (Patient not taking: Reported on 08/17/2019), Disp: 30 tablet, Rfl: 0  Review of Systems  Constitutional: Negative.   Respiratory: Negative.   Cardiovascular: Negative.   Musculoskeletal: Positive for back pain. Negative for gait problem.  Neurological: Negative.   Psychiatric/Behavioral: The patient is nervous/anxious.     Social History   Tobacco Use  . Smoking status: Never Smoker  . Smokeless tobacco: Never Used  Substance Use Topics  . Alcohol use: Yes    Alcohol/week: 3.0 standard drinks    Types: 3 Glasses of wine per week    Comment: OCCASIONAL      Objective:   There were no vitals taken for this visit. There were no vitals  filed for this visit.There is no height or weight on file to calculate BMI.   Physical Exam Vitals reviewed.  Constitutional:      General: She is not in acute distress. Pulmonary:     Effort: No respiratory distress.  Neurological:     Mental Status: She is alert.      No results found for any visits on 08/17/19.     Assessment & Plan     1. Depression, major, single episode, mild (HCC) Worsening again. Increase dose to 450mg  (continue XL 300mg  and add 150mg  XL taken together). F/U in 4-6 weeks.  - buPROPion (WELLBUTRIN XL) 150 MG 24 hr tablet; Take 1 tablet (150 mg total) by mouth daily.  Dispense: 90  tablet; Refill: 1  2. Chronic midline low back pain without sciatica Stable. Diagnosis pulled for medication refill. Continue current medical treatment plan. - meloxicam (MOBIC) 7.5 MG tablet; Take 1 tablet (7.5 mg total) by mouth daily.  Dispense: 90 tablet; Refill: 1    discussed the assessment and treatment plan with the patient. The patient was provided an opportunity to ask questions and all were answered. The patient agreed with the plan and demonstrated an understanding of the instructions.   The patient was advised to call back or seek an in-person evaluation if the symptoms worsen or if the condition fails to improve as anticipated.  I provided 12 minutes of non-face-to-face time during this encounter.    Mar Daring, PA-C  [facility]

## 2019-08-17 NOTE — Telephone Encounter (Signed)
Copied from [place] [id number]. Topic: General - Other >> Aug 16, 2019  3:12 PM Leward Quan A wrote: Reason for CRM: Patient called to request a call back from [facility] in reference to the antidepressant that she is taking. Asking for a call back when ever she have some time patient will be home until 5 PM today. Ph# [phone]

## 2019-08-17 NOTE — Telephone Encounter (Signed)
Looks like patient is on the scheduled for today for a telephone visit.

## 2019-08-17 NOTE — Telephone Encounter (Signed)
LMTCB

## 2019-08-17 NOTE — Telephone Encounter (Signed)
I wont be able to call until closer to 7pm. Can we call to see what it is about please?

## 2019-09-01 DIAGNOSIS — H401133 Primary open-angle glaucoma, bilateral, severe stage: Secondary | ICD-10-CM | POA: Diagnosis not present

## 2019-09-04 ENCOUNTER — Other Ambulatory Visit: Payer: Self-pay | Admitting: Physician Assistant

## 2019-09-04 DIAGNOSIS — G4700 Insomnia, unspecified: Secondary | ICD-10-CM

## 2019-09-04 DIAGNOSIS — I10 Essential (primary) hypertension: Secondary | ICD-10-CM

## 2019-09-04 DIAGNOSIS — F419 Anxiety disorder, unspecified: Secondary | ICD-10-CM

## 2019-09-04 NOTE — Telephone Encounter (Signed)
Requested medication (s) are due for refill today: yes  Requested medication (s) are on the active medication list: yes  Last refill: triamterene-HCTZ 09/13/18,  Alprazolam 02/21/19  Future visit scheduled: No  Notes to clinic:  Cr is high 02/21/19,  other is not delegated    Requested Prescriptions  Pending Prescriptions Disp Refills   triamterene-hydrochlorothiazide (MAXZIDE-25) 37.5-25 MG tablet [Pharmacy Med Name: TRIAMTERENE-HCTZ 37.5-25 MG TAB] 90 tablet 3    Sig: TAKE ONE TABLET BY MOUTH EVERY DAY      Cardiovascular: Diuretic Combos Failed - 09/04/2019  4:54 PM      Failed - Cr in normal range and within 360 days    Creat  Date Value Ref Range Status  05/05/2017 0.98 (H) 0.60 - 0.88 mg/dL Final    Comment:    For patients >42 years of age, the reference limit for Creatinine is approximately 13% higher for people identified as African-American. .    Creatinine, Ser  Date Value Ref Range Status  02/21/2019 1.03 (H) 0.57 - 1.00 mg/dL Final          Failed - Last BP in normal range    BP Readings from Last 1 Encounters:  02/21/19 (!) 158/76          Passed - K in normal range and within 360 days    Potassium  Date Value Ref Range Status  02/21/2019 4.0 3.5 - 5.2 mmol/L Final  06/02/2014 2.8 (L) 3.5 - 5.1 mmol/L Final          Passed - Na in normal range and within 360 days    Sodium  Date Value Ref Range Status  02/21/2019 143 134 - 144 mmol/L Final  06/02/2014 142 136 - 145 mmol/L Final          Passed - Ca in normal range and within 360 days    Calcium  Date Value Ref Range Status  02/21/2019 9.9 8.7 - 10.3 mg/dL Final   Calcium, Total  Date Value Ref Range Status  06/02/2014 8.4 (L) 8.5 - 10.1 mg/dL Final          Passed - Valid encounter within last 6 months    Recent Outpatient Visits           2 weeks ago Depression, major, single episode, mild [facility])   [place], [place], PA-C   4 months ago Depression,  recurrent [facility])   [place], Clearnce Sorrel, [place]   6 months ago [employer], [place], [place]   7 months ago [employer], [place], [place]   8 months ago Colon cancer screening   [place], [place] M, PA-C                ALPRAZolam Duanne Moron) 0.25 MG tablet [Pharmacy Med Name: ALPRAZOLAM 0.25 MG TAB] 90 tablet     Sig: TAKE ONE TABLET 3 TIMES DAILY AS NEEDED FOR ANXIETY      Not Delegated - Psychiatry:  Anxiolytics/Hypnotics Failed - 09/04/2019  4:54 PM      Failed - This refill cannot be delegated      Failed - Urine Drug Screen completed in last 360 days.      Passed - Valid encounter within last 6 months    Recent Outpatient Visits           2 weeks ago Depression, major, single episode, mild ([place])  [place], [place]   4 months ago Depression, recurrent [facility])   [place], Clearnce Sorrel, [place]   6 months ago [place], Clearnce Sorrel, [place]   7 months ago [place], [place]   8 months ago Colon cancer screening   [facility] [place], [place]

## 2019-09-07 ENCOUNTER — Telehealth: Payer: Self-pay | Admitting: Physician Assistant

## 2019-09-07 NOTE — Telephone Encounter (Signed)
Attempted to schedule AWV. Unable to LVM.  Will try at later time.  

## 2019-09-08 DIAGNOSIS — H401133 Primary open-angle glaucoma, bilateral, severe stage: Secondary | ICD-10-CM | POA: Diagnosis not present

## 2019-10-06 DIAGNOSIS — H401133 Primary open-angle glaucoma, bilateral, severe stage: Secondary | ICD-10-CM | POA: Diagnosis not present

## 2019-10-06 DIAGNOSIS — H548 Legal blindness, as defined in USA: Secondary | ICD-10-CM | POA: Diagnosis not present

## 2019-10-27 DIAGNOSIS — H401133 Primary open-angle glaucoma, bilateral, severe stage: Secondary | ICD-10-CM | POA: Diagnosis not present

## 2019-11-20 DIAGNOSIS — B351 Tinea unguium: Secondary | ICD-10-CM | POA: Diagnosis not present

## 2019-11-20 DIAGNOSIS — M79674 Pain in right toe(s): Secondary | ICD-10-CM | POA: Diagnosis not present

## 2019-11-20 DIAGNOSIS — L600 Ingrowing nail: Secondary | ICD-10-CM | POA: Diagnosis not present

## 2019-11-20 DIAGNOSIS — M79675 Pain in left toe(s): Secondary | ICD-10-CM | POA: Diagnosis not present

## 2019-12-15 DIAGNOSIS — H401133 Primary open-angle glaucoma, bilateral, severe stage: Secondary | ICD-10-CM | POA: Diagnosis not present

## 2020-01-15 ENCOUNTER — Ambulatory Visit: Payer: PPO | Admitting: Physician Assistant

## 2020-01-19 ENCOUNTER — Encounter: Payer: Self-pay | Admitting: Physician Assistant

## 2020-01-19 ENCOUNTER — Ambulatory Visit (INDEPENDENT_AMBULATORY_CARE_PROVIDER_SITE_OTHER): Payer: PPO | Admitting: Physician Assistant

## 2020-01-19 ENCOUNTER — Other Ambulatory Visit: Payer: Self-pay

## 2020-01-19 VITALS — BP 181/79 | HR 82 | Temp 98.3°F | Ht 67.0 in | Wt 150.6 lb

## 2020-01-19 DIAGNOSIS — F331 Major depressive disorder, recurrent, moderate: Secondary | ICD-10-CM | POA: Diagnosis not present

## 2020-01-19 DIAGNOSIS — I10 Essential (primary) hypertension: Secondary | ICD-10-CM | POA: Diagnosis not present

## 2020-01-19 MED ORDER — LOSARTAN POTASSIUM 50 MG PO TABS: 50.0000 mg | ORAL_TABLET | Freq: Every day | ORAL | 3 refills | Status: DC

## 2020-01-19 MED ORDER — LOSARTAN POTASSIUM 100 MG PO TABS: 100.0000 mg | ORAL_TABLET | Freq: Every day | ORAL | 1 refills | Status: DC

## 2020-01-19 MED ORDER — SERTRALINE HCL 50 MG PO TABS: 50.0000 mg | ORAL_TABLET | Freq: Every day | ORAL | 1 refills | Status: DC

## 2020-01-19 NOTE — Patient Instructions (Signed)
Start Sertraline (Zoloft) 50mg  at bedtime Can take trazodone for sleep at bedtime still as well Continue Wellbutrin XL 300mg  in monring Start Losartan 50mg  in the morning for blood pressure Continue Triamterene-HCTZ for blood pressure as well

## 2020-01-19 NOTE — Progress Notes (Signed)
Established patient visit   Patient: Jordan Gordon   DOB: July 28, 1933   86 y.o. Female  MRN: [id number] Visit Date: 01/19/2020  Today's healthcare provider: Mar Daring, PA-C   Chief Complaint  Patient presents with  . Depression   I,Latasha Walston,acting as a scribe for [employer], PA-C.,have documented all relevant documentation on the behalf of Mar Daring, PA-C,as directed by  Mar Daring, PA-C while in the presence of Mar Daring, [place].  Subjective    HPI  Depression, Follow-up  She  was last seen for this 5 months ago. Changes made at last visit include Increase dose of Wellbutrin to 450mg  (continue XL 300mg  and add 150mg  XL taken together.   She reports fair compliance with treatment. Patient states she did not take the 150 mg tablet she threw it in the trash. She is not having side effects.   She reports fair tolerance of treatment. Current symptoms include: depressed mood She feels she is Unchanged since last visit.  Depression screen [facility] 2/9 08/17/2019 11/28/2018 05/16/2018  Decreased Interest 3 3 0  Down, Depressed, Hopeless 3 1 0  PHQ - 2 Score 6 4 0  Altered sleeping 0 0 1  Tired, decreased energy 3 3 2   Change in appetite 0 1 0  Feeling bad or failure about yourself  0 2 0  Trouble concentrating 0 0 1  Moving slowly or fidgety/restless 0 1 1  Suicidal thoughts 0 0 2  PHQ-9 Score 9 11 7   Difficult doing work/chores Somewhat difficult Somewhat difficult Not difficult at all  Some recent data might be hidden    -----------------------------------------------------------------------------------------   Patient Active Problem List   Diagnosis Date Noted  . Major depressive disorder, recurrent episode, moderate ([place]) 01/13/2019  . Mechanical and motor problems with internal organs 04/18/2015  . Abnormal foot pulse 04/18/2015  . Clinical depression 04/18/2015  . Fatigue 04/18/2015  . Blood glucose elevated  04/18/2015  . Decreased potassium in the blood 04/18/2015  . Low back pain 04/18/2015  . Paresthesia of skin 04/18/2015  . Gastro-esophageal reflux disease with esophagitis 04/18/2015  . Anxiety 01/07/2015  . Chest pain 09/20/2014  . History of cardiac catheterization 03/08/2014  . Disorder resulting from impaired renal function 07/29/2009  . Muscle ache 03/15/2009  . SOB (shortness of breath) on exertion 09/26/2008  . Adaptation reaction 07/21/2006  . Osteoarthritis 07/21/2006  . 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 Essential (primary) hypertension 04/16/1998  . Allergic rhinitis 03/12/1998  . Insomnia 03/04/1998  . Menopausal and perimenopausal disorder 02/02/1997  . HLD (hyperlipidemia) 12/06/1991   Past Medical History:  Diagnosis Date  . Anxiety   . Coronary artery disease   . GERD (gastroesophageal reflux disease)   . HOH (hard of hearing)   . Hyperlipidemia   . Hypertension   . Major depressive disorder        Medications: Outpatient Medications Prior to Visit  Medication Sig  . ALPRAZolam (XANAX) 0.25 MG tablet TAKE ONE TABLET 3 TIMES DAILY AS NEEDED FOR ANXIETY  . buPROPion (WELLBUTRIN XL) 300 MG 24 hr tablet TAKE ONE TABLET EVERY DAY  . meloxicam (MOBIC) 7.5 MG tablet Take 1 tablet (7.5 mg total) by mouth daily.  . potassium chloride SA (KLOR-CON M15) 15 MEQ tablet Take 1 tablet (15 mEq total) by mouth 2 (two) times daily.  . traZODone (DESYREL) 100 MG tablet TAKE ONE TABLET BY MOUTH AT BEDTIME  . triamterene-hydrochlorothiazide (MAXZIDE-25)  37.5-25 MG tablet TAKE ONE TABLET BY MOUTH EVERY DAY  . [DISCONTINUED] losartan (COZAAR) 50 MG tablet Take 1 tablet (50 mg total) by mouth daily.  . meclizine (ANTIVERT) 12.5 MG tablet Take 1 tablet (12.5 mg total) by mouth 3 (three) times daily as needed for dizziness. (Patient not taking: Reported on 08/17/2019)  . [DISCONTINUED] buPROPion (WELLBUTRIN XL) 150 MG 24 hr tablet Take 1 tablet (150 mg total) by mouth  daily. (Patient not taking: Reported on 01/19/2020)   No facility-administered medications prior to visit.    Review of Systems  Constitutional: Negative.   Respiratory: Negative.   Cardiovascular: Negative.   Musculoskeletal: Negative.   Psychiatric/Behavioral: Positive for dysphoric mood.    Last CBC Lab Results  Component Value Date   WBC 5.0 02/21/2019   HGB 14.2 02/21/2019   HCT 40.3 02/21/2019   MCV 89 02/21/2019   MCH 31.4 02/21/2019   RDW 13.5 02/21/2019   PLT 208 17/40/8144   Last metabolic panel Lab Results  Component Value Date   GLUCOSE 86 02/21/2019   NA 143 02/21/2019   K 4.0 02/21/2019   CL 105 02/21/2019   CO2 26 02/21/2019   BUN 22 02/21/2019   CREATININE 1.03 (H) 02/21/2019   GFRNONAA 50 (L) 02/21/2019   GFRAA 58 (L) 02/21/2019   CALCIUM 9.9 02/21/2019   PROT 6.7 05/05/2017   ALBUMIN 4.1 03/13/2016   LABGLOB 2.3 03/13/2016   AGRATIO 1.8 03/13/2016   BILITOT 0.5 05/05/2017   ALKPHOS 63 03/13/2016   AST 25 05/05/2017   ALT 24 05/05/2017   ANIONGAP 10 01/13/2019      Objective    BP (!) 181/79 (BP Location: Right Arm, Patient Position: Sitting, Cuff Size: Large)   Pulse 82   Temp 98.3 F (36.8 C) (Oral)   Ht 5\' 7"  (1.702 m)   Wt 150 lb 9.6 oz (68.3 kg)   BMI 23.59 kg/m  BP Readings from Last 3 Encounters:  01/19/20 (!) 181/79  02/21/19 (!) 158/76  01/13/19 (!) 177/84   Wt Readings from Last 3 Encounters:  01/19/20 150 lb 9.6 oz (68.3 kg)  02/21/19 161 lb (73 kg)  01/13/19 167 lb 8.8 oz (76 kg)      Physical Exam   General: Appearance:     Overweight female in no acute distress  Eyes:    PERRL, conjunctiva/corneas clear, EOM's intact       Lungs:     Clear to auscultation bilaterally, respirations unlabored  Heart:    Normal heart rate. Normal rhythm. No murmurs, rubs, or gallops.   MS:   All extremities are intact.   Neurologic:   Awake, alert, oriented x 3. No apparent focal neurological           defect.         No  results found for any visits on 01/19/20.  Assessment & Plan     1. Essential hypertension Elevated. Was going to increase losartan to 100mg , but patient admitted to not taking losartan, thus dose was decreased and changed back to 50mg  and advised patient to take medications as prescribed. Continue Maxzide 37.5-25mg .  - losartan (COZAAR) 50 MG tablet; Take 1 tablet (50 mg total) by mouth daily.  Dispense: 90 tablet; Refill: 3  2. Major depressive disorder, recurrent episode, moderate ([facility]) Worsening acutely. Stop wellbutrinXR 150mg . Continue Wellbutrin XR 300mg  (patient had been on 450mg  daily). Start sertraline 50mg  as below. F/U in 4 weeks.  - sertraline (ZOLOFT) 50 MG tablet;  Take 1 tablet (50 mg total) by mouth daily.  Dispense: 30 tablet; Refill: 1   Return in about 4 weeks (around 02/16/2020) for depression and BP.      Reynolds Bowl, PA-C, have reviewed all documentation for this visit. The documentation on 01/30/20 for the exam, diagnosis, procedures, and orders are all accurate and complete.   Rubye Beach  [facility] [phone] (phone) [phone] (fax)  [place]

## 2020-01-29 DIAGNOSIS — H353212 Exudative age-related macular degeneration, right eye, with inactive choroidal neovascularization: Secondary | ICD-10-CM | POA: Diagnosis not present

## 2020-02-02 ENCOUNTER — Other Ambulatory Visit: Payer: Self-pay | Admitting: Physician Assistant

## 2020-02-02 DIAGNOSIS — F339 Major depressive disorder, recurrent, unspecified: Secondary | ICD-10-CM

## 2020-02-08 ENCOUNTER — Other Ambulatory Visit: Payer: Self-pay | Admitting: Physician Assistant

## 2020-02-08 DIAGNOSIS — F419 Anxiety disorder, unspecified: Secondary | ICD-10-CM

## 2020-02-08 NOTE — Telephone Encounter (Signed)
Requested medication (s) are due for refill today: yes  Requested medication (s) are on the active medication list: yes  Last refill:  01/08/2020  Future visit scheduled: yes  Notes to clinic:  this refill cannot be delegated    Requested Prescriptions  Pending Prescriptions Disp Refills   ALPRAZolam (XANAX) 0.25 MG tablet [Pharmacy Med Name: ALPRAZOLAM 0.25 MG TAB] 90 tablet     Sig: TAKE ONE TABLET 3 TIMES DAILY AS NEEDED FOR ANXIETY      Not Delegated - Psychiatry:  Anxiolytics/Hypnotics Failed - 02/08/2020  8:37 AM      Failed - This refill cannot be delegated      Failed - Urine Drug Screen completed in last 360 days.      Passed - Valid encounter within last 6 months    Recent Outpatient Visits           2 weeks ago Essential hypertension   [place], [place], [place]   5 months ago Depression, major, single episode, mild [facility])   [place], [place]   9 months ago Depression, recurrent [facility])   [facility] [place] Malling M, [place]   11 months ago [employer], Clearnce Sorrel, PA-C   1 year ago [place], PA-C       Future Appointments             In 1 week Marlyn Corporal, Clearnce Sorrel, PA-C [employer], [place]

## 2020-02-14 ENCOUNTER — Other Ambulatory Visit: Payer: Self-pay | Admitting: Physician Assistant

## 2020-02-14 DIAGNOSIS — F331 Major depressive disorder, recurrent, moderate: Secondary | ICD-10-CM

## 2020-02-19 ENCOUNTER — Ambulatory Visit (INDEPENDENT_AMBULATORY_CARE_PROVIDER_SITE_OTHER): Payer: PPO | Admitting: Physician Assistant

## 2020-02-19 ENCOUNTER — Encounter: Payer: Self-pay | Admitting: Physician Assistant

## 2020-02-19 ENCOUNTER — Other Ambulatory Visit: Payer: Self-pay

## 2020-02-19 VITALS — BP 178/85 | HR 76 | Temp 98.3°F | Wt 151.2 lb

## 2020-02-19 DIAGNOSIS — I10 Essential (primary) hypertension: Secondary | ICD-10-CM | POA: Diagnosis not present

## 2020-02-19 DIAGNOSIS — Z2821 Immunization not carried out because of patient refusal: Secondary | ICD-10-CM

## 2020-02-19 DIAGNOSIS — F331 Major depressive disorder, recurrent, moderate: Secondary | ICD-10-CM

## 2020-02-19 DIAGNOSIS — F419 Anxiety disorder, unspecified: Secondary | ICD-10-CM | POA: Diagnosis not present

## 2020-02-19 MED ORDER — SERTRALINE HCL 50 MG PO TABS: 50.0000 mg | ORAL_TABLET | Freq: Every day | ORAL | 3 refills | Status: DC

## 2020-02-19 MED ORDER — LOSARTAN POTASSIUM 100 MG PO TABS: 100.0000 mg | ORAL_TABLET | Freq: Every day | ORAL | 3 refills | Status: DC

## 2020-02-19 MED ORDER — BUPROPION HCL ER (XL) 150 MG PO TB24: 150.0000 mg | ORAL_TABLET | Freq: Every day | ORAL | 1 refills | Status: DC

## 2020-02-19 NOTE — Progress Notes (Signed)
Established patient visit   Patient: Jordan Gordon   DOB: December 17, 1933   86 y.o. Female  MRN: [id number] Visit Date: 02/19/2020  Today's healthcare provider: Mar Daring, PA-C   Chief Complaint  Patient presents with   Follow-up   Subjective    HPI  Follow up for Depression  The patient was last seen for this 4 weeks ago. Changes made at last visit include Stop wellbutrinXR 150mg . Continue Wellbutrin XR 300mg  (patient had been on 450mg  daily). Start sertraline 50mg  as below  She reports excellent compliance with treatment. She feels that condition is Improved. She is not having side effects. Reports that she is not taking the 300mg  Wellbutrin that she is taking the WellbutrinXR 150mg .  ----------------------------------------------------------------------------------------- Follow up for Essential Hypertension  The patient was last seen for this 4 weeks ago. Changes made at last visit include Was going to increase losartan to 100mg , but patient admitted to not taking losartan at all, this dose was decreased and changed back to 50mg  and advised patient to take medications as prescribed. Continue Maxzide 37.5-25mg .   She reports excellent compliance with treatment.  BP Readings from Last 3 Encounters:  02/19/20 (!) 178/85  01/19/20 (!) 181/79  02/21/19 (!) 158/76   Wt Readings from Last 3 Encounters:  02/19/20 151 lb 3.2 oz (68.6 kg)  01/19/20 150 lb 9.6 oz (68.3 kg)  02/21/19 161 lb (73 kg)     Symptoms: No chest pain No chest pressure  No palpitations No syncope  No dyspnea No orthopnea  No paroxysmal nocturnal dyspnea No lower extremity edema    -----------------------------------------------------------------------------------------   Patient Active Problem List   Diagnosis Date Noted   Major depressive disorder, recurrent episode, moderate ([place]) 01/13/2019   Mechanical and motor problems with internal organs 04/18/2015   Abnormal foot pulse  04/18/2015   Clinical depression 04/18/2015   Fatigue 04/18/2015   Blood glucose elevated 04/18/2015   Decreased potassium in the blood 04/18/2015   Low back pain 04/18/2015   Paresthesia of skin 04/18/2015   Gastro-esophageal reflux disease with esophagitis 04/18/2015   Anxiety 01/07/2015   Chest pain 09/20/2014   History of cardiac catheterization 03/08/2014   Disorder resulting from impaired renal function 07/29/2009   Muscle ache 03/15/2009   SOB (shortness of breath) on exertion 09/26/2008   Adaptation reaction 07/21/2006   Osteoarthritis 07/21/2006   Benign neoplasm of large bowel 03/08/2000   Essential (primary) hypertension 04/16/1998   Allergic rhinitis 03/12/1998   Insomnia 03/04/1998   Menopausal and perimenopausal disorder 02/02/1997   HLD (hyperlipidemia) 12/06/1991   Past Medical History:  Diagnosis Date   Anxiety    Coronary artery disease    GERD (gastroesophageal reflux disease)    HOH (hard of hearing)    Hyperlipidemia    Hypertension    Major depressive disorder        Medications: Outpatient Medications Prior to Visit  Medication Sig   ALPRAZolam (XANAX) 0.25 MG tablet TAKE ONE TABLET 3 TIMES DAILY AS NEEDED FOR ANXIETY   potassium chloride SA (KLOR-CON M15) 15 MEQ tablet Take 1 tablet (15 mEq total) by mouth 2 (two) times daily.   traZODone (DESYREL) 100 MG tablet TAKE ONE TABLET BY MOUTH AT BEDTIME   triamterene-hydrochlorothiazide (MAXZIDE-25) 37.5-25 MG tablet TAKE ONE TABLET BY MOUTH EVERY DAY   [DISCONTINUED] losartan (COZAAR) 50 MG tablet Take 1 tablet (50 mg total) by mouth daily.   [DISCONTINUED] sertraline (ZOLOFT) 50 MG tablet Take 1 tablet (50  mg total) by mouth daily.   meclizine (ANTIVERT) 12.5 MG tablet Take 1 tablet (12.5 mg total) by mouth 3 (three) times daily as needed for dizziness. (Patient not taking: Reported on 08/17/2019)   meloxicam (MOBIC) 7.5 MG tablet Take 1 tablet (7.5 mg total) by mouth  daily. (Patient not taking: Reported on 02/19/2020)   [DISCONTINUED] buPROPion (WELLBUTRIN XL) 300 MG 24 hr tablet TAKE ONE TABLET EVERY DAY (Patient not taking: Reported on 02/19/2020)   No facility-administered medications prior to visit.    Review of Systems  Constitutional: Negative.   Eyes: Positive for visual disturbance.  Respiratory: Negative.   Cardiovascular: Negative.   Neurological: Negative.   Psychiatric/Behavioral: Negative.     Last CBC Lab Results  Component Value Date   WBC 5.0 02/21/2019   HGB 14.2 02/21/2019   HCT 40.3 02/21/2019   MCV 89 02/21/2019   MCH 31.4 02/21/2019   RDW 13.5 02/21/2019   PLT 208 64/33/2951   Last metabolic panel Lab Results  Component Value Date   GLUCOSE 86 02/21/2019   NA 143 02/21/2019   K 4.0 02/21/2019   CL 105 02/21/2019   CO2 26 02/21/2019   BUN 22 02/21/2019   CREATININE 1.03 (H) 02/21/2019   GFRNONAA 50 (L) 02/21/2019   GFRAA 58 (L) 02/21/2019   CALCIUM 9.9 02/21/2019   PROT 6.7 05/05/2017   ALBUMIN 4.1 03/13/2016   LABGLOB 2.3 03/13/2016   AGRATIO 1.8 03/13/2016   BILITOT 0.5 05/05/2017   ALKPHOS 63 03/13/2016   AST 25 05/05/2017   ALT 24 05/05/2017   ANIONGAP 10 01/13/2019      Objective    BP (!) 178/85 (BP Location: Left Arm, Patient Position: Sitting, Cuff Size: Normal)    Pulse 76    Temp 98.3 F (36.8 C) (Oral)    Wt 151 lb 3.2 oz (68.6 kg)    BMI 23.68 kg/m  BP Readings from Last 3 Encounters:  02/19/20 (!) 178/85  01/19/20 (!) 181/79  02/21/19 (!) 158/76   Wt Readings from Last 3 Encounters:  02/19/20 151 lb 3.2 oz (68.6 kg)  01/19/20 150 lb 9.6 oz (68.3 kg)  02/21/19 161 lb (73 kg)      Physical Exam Vitals reviewed.  Constitutional:      General: She is not in acute distress.    Appearance: Normal appearance. She is well-developed. She is not ill-appearing or diaphoretic.  Cardiovascular:     Rate and Rhythm: Normal rate and regular rhythm.     Heart sounds: Normal heart sounds. No  murmur heard.  No friction rub. No gallop.   Pulmonary:     Effort: Pulmonary effort is normal. No respiratory distress.     Breath sounds: Normal breath sounds. No wheezing or rales.  Musculoskeletal:     Cervical back: Normal range of motion and neck supple.  Neurological:     Mental Status: She is alert.  Psychiatric:        Mood and Affect: Mood normal.        Thought Content: Thought content normal.       No results found for any visits on 02/19/20.  Assessment & Plan     1. Declined Influenza today  2. Essential (primary) hypertension Elevated. Increase losartan to 100mg  from 50mg . Continue Maxzide 37.5-25mg . F/U in 4 weeks. - losartan (COZAAR) 100 MG tablet; Take 1 tablet (100 mg total) by mouth daily.  Dispense: 90 tablet; Refill: 3  3. Anxiety Improved. Continue Wellbutrin XL 150mg   daily. Conitnue sertraline 50mg , trazodone 100mg  (for sleep). Uses Alprazolam 0.25mg  TID prn.  - buPROPion (WELLBUTRIN XL) 150 MG 24 hr tablet; Take 1 tablet (150 mg total) by mouth daily.  Dispense: 90 tablet; Refill: 1  4. Major depressive disorder, recurrent episode, moderate ([place]) See above medical treatment plan. - buPROPion (WELLBUTRIN XL) 150 MG 24 hr tablet; Take 1 tablet (150 mg total) by mouth daily.  Dispense: 90 tablet; Refill: 1 - sertraline (ZOLOFT) 50 MG tablet; Take 1 tablet (50 mg total) by mouth daily.  Dispense: 90 tablet; Refill: 3   No follow-ups on file.      Reynolds Bowl, PA-C, have reviewed all documentation for this visit. The documentation on 02/27/20 for the exam, diagnosis, procedures, and orders are all accurate and complete.   Rubye Beach  [facility] [phone] (phone) [phone] (fax)  [place]

## 2020-02-19 NOTE — Patient Instructions (Addendum)
Continue Maxzide (triamterene-HCTZ) 37.5-25mg  (fluid pill for blood pressure)  Increase Losartan 50mg  to 100mg . I have sent in the 100mg  to the pharmacy. This is for blood pressure  Continue Wellbutrin XL 150mg  in the morning and Sertraline 50mg  at bedtime.   [place] continue to Trazodone 100mg  at bedtime for sleep. This can be used as needed.

## 2020-02-27 ENCOUNTER — Encounter: Payer: Self-pay | Admitting: Physician Assistant

## 2020-03-05 NOTE — Progress Notes (Signed)
Subjective:   Jordan Gordon is a 86 y.o. female who presents for Medicare Annual (Subsequent) preventive examination.  I connected with Jordan Gordon today by telephone and verified that I am speaking with the correct person using two identifiers. Location patient: home Location provider: work Persons participating in the virtual visit: patient, provider.   I discussed the limitations, risks, security and privacy concerns of performing an evaluation and management service by telephone and the availability of in person appointments. I also discussed with the patient that there Devaul be a patient responsible charge related to this service. The patient expressed understanding and verbally consented to this telephonic visit.    Interactive audio and video telecommunications were attempted between this provider and patient, however failed, due to patient having technical difficulties OR patient did not have access to video capability.  We continued and completed visit with audio only.   Review of Systems    N/A  Cardiac Risk Factors include: advanced age (>31men, >45 women);hypertension     Objective:    There were no vitals filed for this visit. There is no height or weight on file to calculate BMI.  Advanced Directives 03/06/2020 01/13/2019 12/26/2018 11/14/2018 01/20/2018 02/04/2017 10/19/2016  Does Patient Have a Medical Advance Directive? Yes No No Yes No No No  Type of Paramedic of [place];Living will - - Living will - - -  Copy of [place] in Chart? No - copy requested - - - - - -  Would patient like information on creating a medical advance directive? - No - Patient declined - - No - Patient declined - No - Patient declined    Current Medications (verified) Outpatient Encounter Medications as of 03/06/2020  Medication Sig  . ALPRAZolam (XANAX) 0.25 MG tablet TAKE ONE TABLET 3 TIMES DAILY AS NEEDED FOR ANXIETY  . brimonidine (ALPHAGAN) 0.2 %  ophthalmic solution 1 drop 3 (three) times daily.  Marland Kitchen buPROPion (WELLBUTRIN XL) 150 MG 24 hr tablet Take 1 tablet (150 mg total) by mouth daily.  . dorzolamide-timolol (COSOPT) 22.3-6.8 MG/ML ophthalmic solution 1 drop 2 (two) times daily.  Marland Kitchen latanoprost (XALATAN) 0.005 % ophthalmic solution Place 1 drop into the right eye at bedtime.  Marland Kitchen losartan (COZAAR) 100 MG tablet Take 1 tablet (100 mg total) by mouth daily.  . potassium chloride SA (KLOR-CON M15) 15 MEQ tablet Take 1 tablet (15 mEq total) by mouth 2 (two) times daily.  . sertraline (ZOLOFT) 50 MG tablet Take 1 tablet (50 mg total) by mouth daily.  . traZODone (DESYREL) 100 MG tablet TAKE ONE TABLET BY MOUTH AT BEDTIME  . triamterene-hydrochlorothiazide (MAXZIDE-25) 37.5-25 MG tablet TAKE ONE TABLET BY MOUTH EVERY DAY  . meclizine (ANTIVERT) 12.5 MG tablet Take 1 tablet (12.5 mg total) by mouth 3 (three) times daily as needed for dizziness. (Patient not taking: Reported on 08/17/2019)  . meloxicam (MOBIC) 7.5 MG tablet Take 1 tablet (7.5 mg total) by mouth daily. (Patient not taking: Reported on 02/19/2020)   No facility-administered encounter medications on file as of 03/06/2020.    Allergies (verified) Codeine   History: Past Medical History:  Diagnosis Date  . Anxiety   . Coronary artery disease   . GERD (gastroesophageal reflux disease)   . HOH (hard of hearing)   . Hyperlipidemia   . Hypertension   . Major depressive disorder    Past Surgical History:  Procedure Laterality Date  . ABDOMINAL HYSTERECTOMY  1979  . BREAST EXCISIONAL  BIOPSY Left 2005   benign  . CATARACT EXTRACTION W/PHACO Left 02/08/2018   Procedure: CATARACT EXTRACTION PHACO AND INTRAOCULAR LENS PLACEMENT (IOC);  Surgeon: Birder Robson, MD;  Location: [facility] ORS;  Service: Ophthalmology;  Laterality: Left;  [place] 00:33.4 AP% 13.9 CDE 4.66 Fluid Pack lot # [id number]  . COLONOSCOPY WITH PROPOFOL N/A 07/22/2015   Procedure: COLONOSCOPY WITH PROPOFOL;  Surgeon:  Manya Silvas, MD;  Location: [facility] ENDOSCOPY;  Service: Endoscopy;  Laterality: N/A;  . COLONOSCOPY WITH PROPOFOL N/A 10/19/2016   Procedure: COLONOSCOPY WITH PROPOFOL;  Surgeon: Manya Silvas, MD;  Location: [facility] ENDOSCOPY;  Service: Endoscopy;  Laterality: N/A;  . JOINT REPLACEMENT     TKR  . KNEE SURGERY Left 2011   2007  . TONSILLECTOMY  1940   ADENOIDS   Family History  Problem Relation Age of Onset  . Cancer Mother   . Heart disease Father   . Cancer Brother   . Breast cancer Neg Hx    Social History   Socioeconomic History  . Marital status: Widowed    Spouse name: Not on file  . Number of children: 2  . Years of education: Not on file  . Highest education level: High school graduate  Occupational History  . Occupation: retired  Tobacco Use  . Smoking status: Never Smoker  . Smokeless tobacco: Never Used  Vaping Use  . Vaping Use: Never used  Substance and Sexual Activity  . Alcohol use: Yes    Alcohol/week: 0.0 standard drinks    Comment: wine once every 2 weeks  . Drug use: No  . Sexual activity: Not on file  Other Topics Concern  . Not on file  Social History Narrative  . Not on file   Social Determinants of Health   Financial Resource Strain: Low Risk   . Difficulty of Paying Living Expenses: Not hard at all  Food Insecurity: No Food Insecurity  . Worried About Charity fundraiser in the Last Year: Never true  . Ran Out of Food in the Last Year: Never true  Transportation Needs: No Transportation Needs  . Lack of Transportation (Medical): No  . Lack of Transportation (Non-Medical): No  Physical Activity: Inactive  . Days of Exercise per Week: 0 days  . Minutes of Exercise per Session: 0 min  Stress: No Stress Concern Present  . Feeling of Stress : Only a little  Social Connections: Moderately Integrated  . Frequency of Communication with Friends and Family: More than three times a week  . Frequency of Social Gatherings with Friends and  Family: More than three times a week  . Attends Religious Services: More than 4 times per year  . Active Member of Clubs or Organizations: No  . Attends Archivist Meetings: Never  . Marital Status: Married    Tobacco Counseling Counseling given: Not Answered   Clinical Intake:  Pre-visit preparation completed: Yes  Pain : No/denies pain     Nutritional Risks: None Diabetes: No  How often do you need to have someone help you when you read instructions, pamphlets, or other written materials from your doctor or pharmacy?: 1 - Never  Diabetic? No  Interpreter Needed?: No  Information entered by :: [facility], LPN   Activities of Daily Living In your present state of health, do you have any difficulty performing the following activities: 03/06/2020 02/19/2020  Hearing? N Y  Comment Wears bilateral hearing aids. wear hearing aids  Vision? N N  Difficulty concentrating  or making decisions? N N  Walking or climbing stairs? N N  Dressing or bathing? N N  Doing errands, shopping? Y N  Comment Does not drive. -  Preparing Food and eating ? N -  Using the Toilet? N -  In the past six months, have you accidently leaked urine? N -  Do you have problems with loss of bowel control? N -  Managing your Medications? N -  Managing your Finances? N -  Housekeeping or managing your Housekeeping? N -  Some recent data might be hidden    Patient Care Team: Mar Daring, PA-C as PCP - General (Family Medicine) Birder Robson, MD as Referring Physician (Ophthalmology)  Indicate any recent Medical Services you Gowell have received from other than [facility] providers in the past year (date [place] be approximate).     Assessment:   This is a routine wellness examination for Jordan Gordon.  Hearing/Vision screen No exam data present  Dietary issues and exercise activities discussed: Exercise limited by: Other - see comments ("Tires easy")  Goals    . Prevent falls     Recommend  to remove any items from the home that [place]a cause slips or trips.      Depression Screen PHQ 2/9 Scores 02/19/2020 08/17/2019 11/28/2018 05/16/2018 04/15/2018 01/20/2018 11/22/2017  PHQ - 2 Score 0 6 4 0 4 0 6  PHQ- 9 Score 0 9 11 7 7  - 15    Fall Risk Fall Risk  03/06/2020 02/19/2020 11/28/2018 01/20/2018 12/28/2017  Falls in the past year? 1 0 1 No No  Comment - - she fell 2 weeks ago. Patient was standing on a chair cleaning cabinets and chair moved and she fell. - -  Number falls in past yr: 0 0 1 - -  Injury with Fall? 0 0 1 - -  Comment - - hurt the back of her head and reports that it was bleeding. She went to the ED - -  Follow up Falls prevention discussed Falls evaluation completed - - -    Any stairs in or around the home? Yes  If so, are there any without handrails? No  Home free of loose throw rugs in walkways, pet beds, electrical cords, etc? Yes  Adequate lighting in your home to reduce risk of falls? Yes   ASSISTIVE DEVICES UTILIZED TO PREVENT FALLS:  Life alert? No  Use of a cane, walker or w/c? No  Grab bars in the bathroom? Yes  Shower chair or bench in shower? No  Elevated toilet seat or a handicapped toilet? Yes    Cognitive Function: Declined today.  MMSE - Mini Mental State Exam 07/12/2017  Orientation to time 5  Orientation to Place 5  Registration 3  Attention/ Calculation 4  Recall 3  Language- name 2 objects 2  Language- repeat 0  Language- follow 3 step command 3  Language- read & follow direction 1  Write a sentence 1  Copy design 0  Total score 27     6CIT Screen 04/13/2017 07/09/2016  What Year? 0 points 0 points  What month? 0 points 0 points  What time? 0 points 0 points  Count back from 20 0 points 0 points  Months in reverse 0 points 2 points  Repeat phrase 6 points 6 points  Total Score 6 8    Immunizations Immunization History  Administered Date(s) Administered  . Fluad Quad(high Dose 65+) 02/21/2019  . Influenza Split 04/08/2005,  02/20/2013  . Influenza,  High Dose Seasonal PF 02/27/2014, 03/04/2015, 04/13/2017, 05/16/2018  . Influenza-Unspecified 03/18/2016  . Pneumococcal Conjugate-13 01/24/2014  . Pneumococcal Polysaccharide-23 04/18/2015  . Td 03/07/1999  . Tdap 03/30/2018  . Zoster 03/24/2012    TDAP status: Up to date Flu Vaccine status: Declined, Education has been provided regarding the importance of this vaccine but patient still declined. Advised Borland receive this vaccine at local pharmacy or Health Dept. Aware to provide a copy of the vaccination record if obtained from local pharmacy or Health Dept. Verbalized acceptance and understanding. Pneumococcal vaccine status: Up to date Covid-19 vaccine status: Completed vaccines  Qualifies for Shingles Vaccine? Yes   Zostavax completed Yes   Shingrix Completed?: No.    Education has been provided regarding the importance of this vaccine. Patient has been advised to call insurance company to determine out of pocket expense if they have not yet received this vaccine. Advised Ndiaye also receive vaccine at local pharmacy or Health Dept. Verbalized acceptance and understanding.  Screening Tests Health Maintenance  Topic Date Due  . COVID-19 Vaccine (1) Never done  . INFLUENZA VACCINE  01/07/2020  . DEXA SCAN  06/08/2026 (Originally 10/21/2000)  . TETANUS/TDAP  03/30/2028  . PNA vac Low Risk Adult  Completed    Health Maintenance  Health Maintenance Due  Topic Date Due  . COVID-19 Vaccine (1) Never done  . INFLUENZA VACCINE  01/07/2020    Colorectal cancer screening: No longer required.  Mammogram status: No longer required.  Bone Density status: Currently due. Declined order at this time.  Lung Cancer Screening: (Low Dose CT Chest recommended if Age 18-80 years, 30 pack-year currently smoking OR have quit w/in 15years.) does not qualify.   Additional Screening:  Vision Screening: Recommended annual ophthalmology exams for early detection of glaucoma  and other disorders of the eye. Is the patient up to date with their annual eye exam?  Yes  Who is the provider or what is the name of the office in which the patient attends annual eye exams? Dr George Ina @ [place] If pt is not established with a provider, would they like to be referred to a provider to establish care? No .   Dental Screening: Recommended annual dental exams for proper oral hygiene  Community Resource Referral / Chronic Care Management: CRR required this visit?  No   CCM required this visit?  No      Plan:     I have personally reviewed and noted the following in the patient's chart:   . Medical and social history . Use of alcohol, tobacco or illicit drugs  . Current medications and supplements . Functional ability and status . Nutritional status . Physical activity . Advanced directives . List of other physicians . Hospitalizations, surgeries, and ER visits in previous 12 months . Vitals . Screenings to include cognitive, depression, and falls . Referrals and appointments  In addition, I have reviewed and discussed with patient certain preventive protocols, quality metrics, and best practice recommendations. A written personalized care plan for preventive services as well as general preventive health recommendations were provided to patient.     Jordan Gordon, [place]   9/60/4540   Nurse Notes: Pt to receive the flu shot at pharmacy this fall. Requested Covid vaccine card information to update chart.

## 2020-03-06 ENCOUNTER — Other Ambulatory Visit: Payer: Self-pay

## 2020-03-06 ENCOUNTER — Ambulatory Visit (INDEPENDENT_AMBULATORY_CARE_PROVIDER_SITE_OTHER): Payer: PPO

## 2020-03-06 DIAGNOSIS — Z0000 Encounter for general adult medical examination without abnormal findings: Secondary | ICD-10-CM | POA: Diagnosis not present

## 2020-03-06 NOTE — Patient Instructions (Signed)
Jordan Gordon , Thank you for taking time to come for your Medicare Wellness Visit. I appreciate your ongoing commitment to your health goals. Please review the following plan we discussed and let me know if I can assist you in the future.   Screening recommendations/referrals: Colonoscopy: No longer required.  Mammogram: No longer required.  Bone Density: Currently due, declined order at this time.  Recommended yearly ophthalmology/optometry visit for glaucoma screening and checkup Recommended yearly dental visit for hygiene and checkup  Vaccinations: Influenza vaccine: Currently due Pneumococcal vaccine: Completed series Tdap vaccine: Up to date, due 03/2028 Shingles vaccine: Shingrix discussed. Please contact your pharmacy for coverage information.     Advanced directives: Please bring a copy of your POA (Power of Attorney) and/or Living Will to your next appointment.   Conditions/risks identified: Fall risk preventatives discussed today.   Next appointment: 04/17/20 @ 10:00 AM with Jordan Gordon. Declined scheduling an AWV for 2022 at this time.    Preventive Care 84 Years and Older, Female Preventive care refers to lifestyle choices and visits with your health care provider that can promote health and wellness. What does preventive care include?  A yearly physical exam. This is also called an annual well check.  Dental exams once or twice a year.  Routine eye exams. Ask your health care provider how often you should have your eyes checked.  Personal lifestyle choices, including:  Daily care of your teeth and gums.  Regular physical activity.  Eating a healthy diet.  Avoiding tobacco and drug use.  Limiting alcohol use.  Practicing safe sex.  Taking low-dose aspirin every day.  Taking vitamin and mineral supplements as recommended by your health care provider. What happens during an annual well check? The services and screenings done by your health care provider  during your annual well check will depend on your age, overall health, lifestyle risk factors, and family history of disease. Counseling  Your health care provider Mark ask you questions about your:  Alcohol use.  Tobacco use.  Drug use.  Emotional well-being.  Home and relationship well-being.  Sexual activity.  Eating habits.  History of falls.  Memory and ability to understand (cognition).  Work and work Statistician.  Reproductive health. Screening  You [name] have the following tests or measurements:  Height, weight, and BMI.  Blood pressure.  Lipid and cholesterol levels. These Jordan Gordon be checked every 5 years, or more frequently if you are over 84 years old.  Skin check.  Lung cancer screening. You Jordan Gordon have this screening every year starting at age 84 if you have a 30-pack-year history of smoking and currently smoke or have quit within the past 15 years.  Fecal occult blood test (FOBT) of the stool. You Jordan Gordon have this test every year starting at age 84.  Flexible sigmoidoscopy or colonoscopy. You Jordan Gordon have a sigmoidoscopy every 5 years or a colonoscopy every 10 years starting at age 84.  Hepatitis C blood test.  Hepatitis B blood test.  Sexually transmitted disease (STD) testing.  Diabetes screening. This is done by checking your blood sugar (glucose) after you have not eaten for a while (fasting). You Jordan Gordon have this done every 1-3 years.  Bone density scan. This is done to screen for osteoporosis. You Jordan Gordon have this done starting at age 30.  Mammogram. This Jordan Gordon be done every 1-2 years. Talk to your health care provider about how often you should have regular mammograms. Talk with your health care provider about your test  results, treatment options, and if necessary, the need for more tests. Vaccines  Your health care provider Jordan Gordon recommend certain vaccines, such as:  Influenza vaccine. This is recommended every year.  Tetanus, diphtheria, and acellular pertussis  (Tdap, Td) vaccine. You Jordan Gordon need a Td booster every 10 years.  Zoster vaccine. You Jordan Gordon need this after age 75.  Pneumococcal 13-valent conjugate (PCV13) vaccine. One dose is recommended after age 5.  Pneumococcal polysaccharide (PPSV23) vaccine. One dose is recommended after age 45. Talk to your health care provider about which screenings and vaccines you need and how often you need them. This information is not intended to replace advice given to you by your health care provider. Make sure you discuss any questions you have with your health care provider. Document Released: 06/21/2015 Document Revised: 02/12/2016 Document Reviewed: 03/26/2015 Elsevier Interactive Patient Education  2017 Jordan Gordon Prevention in the Home Falls can cause injuries. They can happen to people of all ages. There are many things you can do to make your home safe and to help prevent falls. What can I do on the outside of my home?  Regularly fix the edges of walkways and driveways and fix any cracks.  Remove anything that might make you trip as you walk through a door, such as a raised step or threshold.  Trim any bushes or trees on the path to your home.  Use bright outdoor lighting.  Clear any walking paths of anything that might make someone trip, such as rocks or tools.  Regularly check to see if handrails are loose or broken. Make sure that both sides of any steps have handrails.  Any raised decks and porches should have guardrails on the edges.  Have any leaves, snow, or ice cleared regularly.  Use sand or salt on walking paths during winter.  Clean up any spills in your garage right away. This includes oil or grease spills. What can I do in the bathroom?  Use night lights.  Install grab bars by the toilet and in the tub and shower. Do not use towel bars as grab bars.  Use non-skid mats or decals in the tub or shower.  If you need to sit down in the shower, use a plastic, non-slip  stool.  Keep the floor dry. Clean up any water that spills on the floor as soon as it happens.  Remove soap buildup in the tub or shower regularly.  Attach bath mats securely with double-sided non-slip rug tape.  Do not have throw rugs and other things on the floor that can make you trip. What can I do in the bedroom?  Use night lights.  Make sure that you have a light by your bed that is easy to reach.  Do not use any sheets or blankets that are too big for your bed. They should not hang down onto the floor.  Have a firm chair that has side arms. You can use this for support while you get dressed.  Do not have throw rugs and other things on the floor that can make you trip. What can I do in the kitchen?  Clean up any spills right away.  Avoid walking on wet floors.  Keep items that you use a lot in easy-to-reach places.  If you need to reach something above you, use a strong step stool that has a grab bar.  Keep electrical cords out of the way.  Do not use floor polish or wax that makes  floors slippery. If you must use wax, use non-skid floor wax.  Do not have throw rugs and other things on the floor that can make you trip. What can I do with my stairs?  Do not leave any items on the stairs.  Make sure that there are handrails on both sides of the stairs and use them. Fix handrails that are broken or loose. Make sure that handrails are as long as the stairways.  Check any carpeting to make sure that it is firmly attached to the stairs. Fix any carpet that is loose or worn.  Avoid having throw rugs at the top or bottom of the stairs. If you do have throw rugs, attach them to the floor with carpet tape.  Make sure that you have a light switch at the top of the stairs and the bottom of the stairs. If you do not have them, ask someone to add them for you. What else can I do to help prevent falls?  Wear shoes that:  Do not have high heels.  Have rubber bottoms.  Are  comfortable and fit you well.  Are closed at the toe. Do not wear sandals.  If you use a stepladder:  Make sure that it is fully opened. Do not climb a closed stepladder.  Make sure that both sides of the stepladder are locked into place.  Ask someone to hold it for you, if possible.  Clearly mark and make sure that you can see:  Any grab bars or handrails.  First and last steps.  Where the edge of each step is.  Use tools that help you move around (mobility aids) if they are needed. These include:  Canes.  Walkers.  Scooters.  Crutches.  Turn on the lights when you go into a dark area. Replace any light bulbs as soon as they burn out.  Set up your furniture so you have a clear path. Avoid moving your furniture around.  If any of your floors are uneven, fix them.  If there are any pets around you, be aware of where they are.  Review your medicines with your doctor. Some medicines can make you feel dizzy. This can increase your chance of falling. Ask your doctor what other things that you can do to help prevent falls. This information is not intended to replace advice given to you by your health care provider. Make sure you discuss any questions you have with your health care provider. Document Released: 03/21/2009 Document Revised: 10/31/2015 Document Reviewed: 06/29/2014 Elsevier Interactive Patient Education  2017 [employer].

## 2020-04-06 ENCOUNTER — Other Ambulatory Visit: Payer: Self-pay | Admitting: Physician Assistant

## 2020-04-06 DIAGNOSIS — E876 Hypokalemia: Secondary | ICD-10-CM

## 2020-04-06 NOTE — Telephone Encounter (Signed)
Requested medication (s) are due for refill today: yes  Requested medication (s) are on the active medication list: yes  Last refill:  02/21/19  Future visit scheduled: no  Notes to clinic:  overdue lab work   Requested Prescriptions  Pending Prescriptions Disp Refills   potassium chloride (KLOR-CON) 10 MEQ tablet [Pharmacy Med Name: POTASSIUM CHLORIDE CRYS ER 10 MEQ T] 270 tablet     Sig: TAKE 1.5 TABLETS TWICE DAILY      Endocrinology:  Minerals - Potassium Supplementation Failed - 04/06/2020 10:32 AM      Failed - K in normal range and within 360 days    Potassium  Date Value Ref Range Status  02/21/2019 4.0 3.5 - 5.2 mmol/L Final  06/02/2014 2.8 (L) 3.5 - 5.1 mmol/L Final          Failed - Cr in normal range and within 360 days    Creat  Date Value Ref Range Status  05/05/2017 0.98 (H) 0.60 - 0.88 mg/dL Final    Comment:    For patients >79 years of age, the reference limit for Creatinine is approximately 13% higher for people identified as African-American. .    Creatinine, Ser  Date Value Ref Range Status  02/21/2019 1.03 (H) 0.57 - 1.00 mg/dL Final          Passed - Valid encounter within last 12 months    Recent Outpatient Visits           1 month ago Influenza vaccination declined   [facility], [place]   2 months ago Essential hypertension   [facility] [place] M, [place]   7 months ago Depression, major, single episode, mild [facility])   [place], [place]   11 months ago Depression, recurrent [facility])   [facility] [place], [place], [place]   1 year ago [facility], [place], [place]

## 2020-04-15 NOTE — Progress Notes (Signed)
Complete physical exam   Patient: Jordan Gordon   DOB: [birth year]   86 y.o. Female  MRN: [id number] Visit Date: 04/17/2020  Today's healthcare provider: Mar Daring, PA-C   Chief Complaint  Patient presents with  . Annual Exam   Subjective    Jordan Gordon is a 86 y.o. female who presents today for a complete physical exam.  She reports consuming a general diet. The patient does not participate in regular exercise at present. She generally feels fairly well. She reports sleeping well. She does not have additional problems to discuss today.  HPI  Patient had AWV with [facility] 03/06/20.  Past Medical History:  Diagnosis Date  . Anxiety   . Coronary artery disease   . GERD (gastroesophageal reflux disease)   . HOH (hard of hearing)   . Hyperlipidemia   . Hypertension   . Major depressive disorder    Past Surgical History:  Procedure Laterality Date  . ABDOMINAL HYSTERECTOMY  1979  . BREAST EXCISIONAL BIOPSY Left 2005   benign  . CATARACT EXTRACTION W/PHACO Left 02/08/2018   Procedure: CATARACT EXTRACTION PHACO AND INTRAOCULAR LENS PLACEMENT (IOC);  Surgeon: Birder Robson, MD;  Location: [facility] ORS;  Service: Ophthalmology;  Laterality: Left;  [place] 00:33.4 AP% 13.9 CDE 4.66 Fluid Pack lot # [id number]  . COLONOSCOPY WITH PROPOFOL N/A 07/22/2015   Procedure: COLONOSCOPY WITH PROPOFOL;  Surgeon: Manya Silvas, MD;  Location: [facility] ENDOSCOPY;  Service: Endoscopy;  Laterality: N/A;  . COLONOSCOPY WITH PROPOFOL N/A 10/19/2016   Procedure: COLONOSCOPY WITH PROPOFOL;  Surgeon: Manya Silvas, MD;  Location: [facility] ENDOSCOPY;  Service: Endoscopy;  Laterality: N/A;  . JOINT REPLACEMENT     TKR  . KNEE SURGERY Left 2011   2007  . TONSILLECTOMY  1940   ADENOIDS   Social History   Socioeconomic History  . Marital status: Widowed    Spouse name: Not on file  . Number of children: 2  . Years of education: Not on file  . Highest education level: High school graduate    Occupational History  . Occupation: retired  Tobacco Use  . Smoking status: Never Smoker  . Smokeless tobacco: Never Used  Vaping Use  . Vaping Use: Never used  Substance and Sexual Activity  . Alcohol use: Yes    Alcohol/week: 0.0 standard drinks    Comment: wine once every 2 weeks  . Drug use: No  . Sexual activity: Not on file  Other Topics Concern  . Not on file  Social History Narrative  . Not on file   Social Determinants of Health   Financial Resource Strain: Low Risk   . Difficulty of Paying Living Expenses: Not hard at all  Food Insecurity: No Food Insecurity  . Worried About Charity fundraiser in the Last Year: Never true  . Ran Out of Food in the Last Year: Never true  Transportation Needs: No Transportation Needs  . Lack of Transportation (Medical): No  . Lack of Transportation (Non-Medical): No  Physical Activity: Inactive  . Days of Exercise per Week: 0 days  . Minutes of Exercise per Session: 0 min  Stress: No Stress Concern Present  . Feeling of Stress : Only a little  Social Connections: Moderately Integrated  . Frequency of Communication with Friends and Family: More than three times a week  . Frequency of Social Gatherings with Friends and Family: More than three times a week  . Attends Religious Services: More  than 4 times per year  . Active Member of Clubs or Organizations: No  . Attends Archivist Meetings: Never  . Marital Status: Married  Human resources officer Violence: Not At Risk  . Fear of Current or Ex-Partner: No  . Emotionally Abused: No  . Physically Abused: No  . Sexually Abused: No   Family Status  Relation Name Status  . Mother  Deceased at age 63       stomach cancer  . Father  Deceased at age 43's       stroke  . Sister  Alive  . Brother  Deceased at age 37's       prostate cancer  . Neg Hx  (Not Specified)   Family History  Problem Relation Age of Onset  . Cancer Mother   . Heart disease Father   . Cancer  Brother   . Breast cancer Neg Hx    Allergies  Allergen Reactions  . Codeine     nausea    Patient Care Team: Mar Daring, PA-C as PCP - General (Family Medicine) Birder Robson, MD as Referring Physician (Ophthalmology)   Medications: Outpatient Medications Prior to Visit  Medication Sig  . ALPRAZolam (XANAX) 0.25 MG tablet TAKE ONE TABLET 3 TIMES DAILY AS NEEDED FOR ANXIETY  . dorzolamide-timolol (COSOPT) 22.3-6.8 MG/ML ophthalmic solution 1 drop 2 (two) times daily.  . potassium chloride (KLOR-CON) 10 MEQ tablet TAKE 1.5 TABLETS TWICE DAILY  . traZODone (DESYREL) 100 MG tablet TAKE ONE TABLET BY MOUTH AT BEDTIME  . triamterene-hydrochlorothiazide (MAXZIDE-25) 37.5-25 MG tablet TAKE ONE TABLET BY MOUTH EVERY DAY  . [DISCONTINUED] losartan (COZAAR) 100 MG tablet Take 1 tablet (100 mg total) by mouth daily.  . [DISCONTINUED] sertraline (ZOLOFT) 50 MG tablet Take 1 tablet (50 mg total) by mouth daily.  . brimonidine (ALPHAGAN) 0.2 % ophthalmic solution 1 drop 3 (three) times daily.  Marland Kitchen latanoprost (XALATAN) 0.005 % ophthalmic solution Place 1 drop into the right eye at bedtime.  . [DISCONTINUED] buPROPion (WELLBUTRIN XL) 150 MG 24 hr tablet Take 1 tablet (150 mg total) by mouth daily. (Patient not taking: Reported on 04/17/2020)  . [DISCONTINUED] meclizine (ANTIVERT) 12.5 MG tablet Take 1 tablet (12.5 mg total) by mouth 3 (three) times daily as needed for dizziness. (Patient not taking: Reported on 08/17/2019)  . [DISCONTINUED] meloxicam (MOBIC) 7.5 MG tablet Take 1 tablet (7.5 mg total) by mouth daily. (Patient not taking: Reported on 02/19/2020)   No facility-administered medications prior to visit.    Review of Systems  Constitutional: Negative.   HENT: Negative.   Eyes: Negative.   Respiratory: Negative.   Cardiovascular: Negative.   Gastrointestinal: Negative.   Endocrine: Negative.   Genitourinary: Negative.   Musculoskeletal: Negative.   Skin: Negative.    Allergic/Immunologic: Negative.   Neurological: Negative.   Hematological: Negative.   Psychiatric/Behavioral: Negative.     Last CBC Lab Results  Component Value Date   WBC 5.5 04/17/2020   HGB 14.7 04/17/2020   HCT 42.3 04/17/2020   MCV 87 04/17/2020   MCH 30.3 04/17/2020   RDW 12.3 04/17/2020   PLT 245 66/44/0347   Last metabolic panel Lab Results  Component Value Date   GLUCOSE 98 04/17/2020   NA 142 04/17/2020   K 4.3 04/17/2020   CL 103 04/17/2020   CO2 26 04/17/2020   BUN 19 04/17/2020   CREATININE 1.04 (H) 04/17/2020   GFRNONAA 49 (L) 04/17/2020   GFRAA 57 (L) 04/17/2020  CALCIUM 9.8 04/17/2020   PROT 6.9 04/17/2020   ALBUMIN 4.4 04/17/2020   LABGLOB 2.5 04/17/2020   AGRATIO 1.8 04/17/2020   BILITOT 0.4 04/17/2020   ALKPHOS 108 04/17/2020   AST 21 04/17/2020   ALT 19 04/17/2020   ANIONGAP 10 01/13/2019   Last lipids Lab Results  Component Value Date   CHOL 246 (H) 04/17/2020   HDL 52 04/17/2020   LDLCALC 169 (H) 04/17/2020   TRIG 139 04/17/2020   CHOLHDL 4.7 (H) 04/17/2020      Objective    BP (!) 176/82 (BP Location: Right Arm, Patient Position: Sitting, Cuff Size: Large)   Pulse 79   Temp 98.6 F (37 C) (Oral)   Resp 16   Wt 147 lb 11.2 oz (67 kg)   BMI 23.13 kg/m  BP Readings from Last 3 Encounters:  04/17/20 (!) 176/82  02/19/20 (!) 178/85  01/19/20 (!) 181/79   Wt Readings from Last 3 Encounters:  04/17/20 147 lb 11.2 oz (67 kg)  02/19/20 151 lb 3.2 oz (68.6 kg)  01/19/20 150 lb 9.6 oz (68.3 kg)      Physical Exam Vitals reviewed.  Constitutional:      General: She is not in acute distress.    Appearance: Normal appearance. She is well-developed and normal weight. She is not ill-appearing or diaphoretic.  HENT:     Head: Normocephalic and atraumatic.     Right Ear: Tympanic membrane, ear canal and external ear normal.     Left Ear: Tympanic membrane, ear canal and external ear normal.  Eyes:     General: No scleral  icterus.       Right eye: No discharge.        Left eye: No discharge.     Extraocular Movements: Extraocular movements intact.     Conjunctiva/sclera: Conjunctivae normal.     Pupils: Pupils are equal, round, and reactive to light.  Neck:     Thyroid: No thyromegaly.     Vascular: No carotid bruit or JVD.     Trachea: No tracheal deviation.  Cardiovascular:     Rate and Rhythm: Normal rate and regular rhythm.     Pulses: Normal pulses.     Heart sounds: Normal heart sounds. No murmur heard.  No friction rub. No gallop.   Pulmonary:     Effort: Pulmonary effort is normal. No respiratory distress.     Breath sounds: Normal breath sounds. No wheezing or rales.  Chest:     Chest wall: No tenderness.  Abdominal:     General: Abdomen is flat. Bowel sounds are normal. There is no distension.     Palpations: Abdomen is soft. There is no mass.     Tenderness: There is no abdominal tenderness. There is no guarding or rebound.  Musculoskeletal:        General: No tenderness. Normal range of motion.     Cervical back: Normal range of motion and neck supple. No tenderness.     Right lower leg: No edema.     Left lower leg: No edema.  Lymphadenopathy:     Cervical: No cervical adenopathy.  Skin:    General: Skin is warm and dry.     Capillary Refill: Capillary refill takes less than 2 seconds.     Findings: No rash.  Neurological:     General: No focal deficit present.     Mental Status: She is alert and oriented to person, place, and time. Mental status is at  baseline.  Psychiatric:        Mood and Affect: Mood normal.        Behavior: Behavior normal.        Thought Content: Thought content normal.        Judgment: Judgment normal.      Last depression screening scores PHQ 2/9 Scores 02/19/2020 08/17/2019 11/28/2018  PHQ - 2 Score 0 6 4  PHQ- 9 Score 0 9 11   Last fall risk screening Fall Risk  03/06/2020  Falls in the past year? 1  Comment -  Number falls in past yr: 0    Injury with Fall? 0  Comment -  Follow up Falls prevention discussed   Last Audit-C alcohol use screening Alcohol Use Disorder Test (AUDIT) 03/06/2020  1. How often do you have a drink containing alcohol? 1  2. How many drinks containing alcohol do you have on a typical day when you are drinking? 0  3. How often do you have six or more drinks on one occasion? 0  AUDIT-C Score 1  Alcohol Brief Interventions/Follow-up AUDIT Score <7 follow-up not indicated   A score of 3 or more in women, and 4 or more in men indicates increased risk for alcohol abuse, EXCEPT if all of the points are from question 1   Results for orders placed or performed in visit on 04/17/20  Urine Culture   Specimen: Urine   Urine  Result Value Ref Range   Urine Culture, Routine Final report    Organism ID, Bacteria Comment   CBC with Differential/Platelet  Result Value Ref Range   WBC 5.5 3.4 - 10.8 x10E3/uL   RBC 4.85 3.77 - 5.28 x10E6/uL   Hemoglobin 14.7 11.1 - 15.9 g/dL   Hematocrit 42.3 34.0 - 46.6 %   MCV 87 79 - 97 fL   MCH 30.3 26.6 - 33.0 pg   MCHC 34.8 31 - 35 g/dL   RDW 12.3 11.7 - 15.4 %   Platelets 245 150 - 450 x10E3/uL   Neutrophils 61 Not Estab. %   Lymphs 24 Not Estab. %   Monocytes 9 Not Estab. %   Eos 5 Not Estab. %   Basos 1 Not Estab. %   Neutrophils Absolute 3.4 1.40 - 7.00 x10E3/uL   Lymphocytes Absolute 1.3 0 - 3 x10E3/uL   Monocytes Absolute 0.5 0 - 0 x10E3/uL   EOS (ABSOLUTE) 0.3 0.0 - 0.4 x10E3/uL   Basophils Absolute 0.0 0 - 0 x10E3/uL   Immature Granulocytes 0 Not Estab. %   Immature Grans (Abs) 0.0 0.0 - 0.1 x10E3/uL  Comprehensive metabolic panel  Result Value Ref Range   Glucose 98 65 - 99 mg/dL   BUN 19 8 - 27 mg/dL   Creatinine, Ser 1.04 (H) 0.57 - 1.00 mg/dL   GFR calc non Af Amer 49 (L) >59 mL/min/1.73   GFR calc Af Amer 57 (L) >59 mL/min/1.73   BUN/Creatinine Ratio 18 12 - 28   Sodium 142 134 - 144 mmol/L   Potassium 4.3 3.5 - 5.2 mmol/L   Chloride 103 96 -  106 mmol/L   CO2 26 20 - 29 mmol/L   Calcium 9.8 8.7 - 10.3 mg/dL   Total Protein 6.9 6.0 - 8.5 g/dL   Albumin 4.4 3.6 - 4.6 g/dL   Globulin, Total 2.5 1.5 - 4.5 g/dL   Albumin/Globulin Ratio 1.8 1.2 - 2.2   Bilirubin Total 0.4 0.0 - 1.2 mg/dL   Alkaline Phosphatase 108 44 -  121 IU/L   AST 21 0 - 40 IU/L   ALT 19 0 - 32 IU/L  Lipid panel  Result Value Ref Range   Cholesterol, Total 246 (H) 100 - 199 mg/dL   Triglycerides 139 0 - 149 mg/dL   HDL 52 >39 mg/dL   VLDL Cholesterol Cal 25 5 - 40 mg/dL   LDL Chol Calc (NIH) 169 (H) 0 - 99 mg/dL   Chol/HDL Ratio 4.7 (H) 0.0 - 4.4 ratio  POCT urinalysis dipstick  Result Value Ref Range   Color, UA light Yellow    Clarity, UA Clear    Glucose, UA Negative Negative   Bilirubin, UA Negative    Ketones, UA Negative    Spec Grav, UA 1.015 1.010 - 1.025   Blood, UA Negative    pH, UA 6.5 5.0 - 8.0   Protein, UA Negative Negative   Urobilinogen, UA 0.2 0.2 or 1.0 E.U./dL   Nitrite, UA Negative    Leukocytes, UA Small (1+) (A) Negative   Appearance     Odor      Assessment & Plan    Routine Health Maintenance and Physical Exam  Exercise Activities and Dietary recommendations Goals    . Prevent falls     Recommend to remove any items from the home that Haffey cause slips or trips.       Immunization History  Administered Date(s) Administered  . Fluad Quad(high Dose 65+) 02/21/2019  . Influenza Split 04/08/2005, 02/20/2013  . Influenza, High Dose Seasonal PF 02/27/2014, 03/04/2015, 04/13/2017, 05/16/2018  . Influenza-Unspecified 03/18/2016, 03/09/2020  . PFIZER SARS-COV-2 Vaccination 04/17/2020  . Pneumococcal Conjugate-13 01/24/2014  . Pneumococcal Polysaccharide-23 04/18/2015  . Td 03/07/1999  . Tdap 03/30/2018  . Zoster 03/24/2012    Health Maintenance  Topic Date Due  . DEXA SCAN  06/08/2026 (Originally 10/21/2000)  . COVID-19 Vaccine (2 - Pfizer 2-dose series) 05/08/2020  . TETANUS/TDAP  03/30/2028  . INFLUENZA  VACCINE  Completed  . PNA vac Low Risk Adult  Completed    Discussed health benefits of physical activity, and encouraged her to engage in regular exercise appropriate for her age and condition.  1. Annual physical exam Normal physical exam today. Will check labs as below and f/u pending lab results. If labs are stable and WNL she will not need to have these rechecked for one year at her next annual physical exam. She is to call the office in the meantime if she has any acute issue, questions or concerns.  2. Frequency of urination UA appears positive. Culture sent. - CBC with Differential/Platelet - Urine Culture  3. Essential (primary) hypertension Elevated today. Increase Losartan from 50mg  to 100mg . F/U in 4 weeks. Will check labs as below and f/u pending results. - CBC with Differential/Platelet - Comprehensive metabolic panel - Lipid panel - losartan (COZAAR) 100 MG tablet; Take 1 tablet (100 mg total) by mouth daily.  Dispense: 90 tablet; Refill: 3  4. Adjustment disorder with mixed anxiety and depressed mood Patient request to increase from 50mg  to 100mg  as below.  - sertraline (ZOLOFT) 100 MG tablet; Take 1 tablet (100 mg total) by mouth daily.  Dispense: 90 tablet; Refill: 3  5. Acute cystitis without hematuria Worsening symptoms. UA positive. Will treat empirically with Keflex as below. Continue to push fluids. Urine sent for culture. Will follow up pending C&S results. She is to call if symptoms do not improve or if they worsen.  - cephALEXin (KEFLEX) 500 MG capsule; Take 1  capsule (500 mg total) by mouth 2 (two) times daily.  Dispense: 10 capsule; Refill: 0  6. Nasal sore Noted to have a chronic sore in the right nare. Reports that she has been evaluated by ENT. Bactroban has helped heal it before. Comes back in the winter with dry air. Will give Bactroban as below.  - mupirocin cream (BACTROBAN) 2 %; Apply 1 application topically 2 (two) times daily as needed.  Dispense:  30 g; Refill: 0  7. Need for COVID-19 vaccine Covid 19 Booster Vaccine given to patient without complications. Patient sat for 15 minutes after administration and was tolerated well without adverse effects. - Pfizer SARS-COV-2 Vaccine   Return in about 6 months (around 10/15/2020).     Reynolds Bowl, PA-C, have reviewed all documentation for this visit. The documentation on 04/23/20 for the exam, diagnosis, procedures, and orders are all accurate and complete.   Rubye Beach  [facility] [phone] (phone) [phone] (fax)  [place]

## 2020-04-17 ENCOUNTER — Ambulatory Visit (INDEPENDENT_AMBULATORY_CARE_PROVIDER_SITE_OTHER): Payer: PPO | Admitting: Physician Assistant

## 2020-04-17 ENCOUNTER — Other Ambulatory Visit: Payer: Self-pay

## 2020-04-17 ENCOUNTER — Encounter: Payer: Self-pay | Admitting: Physician Assistant

## 2020-04-17 VITALS — BP 176/82 | HR 79 | Temp 98.6°F | Resp 16 | Wt 147.7 lb

## 2020-04-17 DIAGNOSIS — R350 Frequency of micturition: Secondary | ICD-10-CM | POA: Diagnosis not present

## 2020-04-17 DIAGNOSIS — J3489 Other specified disorders of nose and nasal sinuses: Secondary | ICD-10-CM | POA: Diagnosis not present

## 2020-04-17 DIAGNOSIS — Z0000 Encounter for general adult medical examination without abnormal findings: Secondary | ICD-10-CM | POA: Diagnosis not present

## 2020-04-17 DIAGNOSIS — F4323 Adjustment disorder with mixed anxiety and depressed mood: Secondary | ICD-10-CM

## 2020-04-17 DIAGNOSIS — N3000 Acute cystitis without hematuria: Secondary | ICD-10-CM

## 2020-04-17 DIAGNOSIS — Z23 Encounter for immunization: Secondary | ICD-10-CM

## 2020-04-17 DIAGNOSIS — I10 Essential (primary) hypertension: Secondary | ICD-10-CM | POA: Diagnosis not present

## 2020-04-17 LAB — POCT URINALYSIS DIPSTICK
Bilirubin, UA: NEGATIVE
Blood, UA: NEGATIVE
Glucose, UA: NEGATIVE
Ketones, UA: NEGATIVE
Nitrite, UA: NEGATIVE
Protein, UA: NEGATIVE
Spec Grav, UA: 1.015 (ref 1.010–1.025)
Urobilinogen, UA: 0.2 E.U./dL
pH, UA: 6.5 (ref 5.0–8.0)

## 2020-04-17 MED ORDER — CEPHALEXIN 500 MG PO CAPS: 500.0000 mg | ORAL_CAPSULE | Freq: Two times a day (BID) | ORAL | 0 refills | Status: DC

## 2020-04-17 MED ORDER — MUPIROCIN CALCIUM 2 % EX CREA: 1.0000 "application " | TOPICAL_CREAM | Freq: Two times a day (BID) | CUTANEOUS | 0 refills | Status: DC | PRN

## 2020-04-17 MED ORDER — LOSARTAN POTASSIUM 100 MG PO TABS: 100.0000 mg | ORAL_TABLET | Freq: Every day | ORAL | 3 refills | Status: DC

## 2020-04-17 MED ORDER — SERTRALINE HCL 100 MG PO TABS: 100.0000 mg | ORAL_TABLET | Freq: Every day | ORAL | 3 refills | Status: DC

## 2020-04-17 NOTE — Patient Instructions (Signed)
Preventive Care 84 Years and Older, Female Preventive care refers to lifestyle choices and visits with your health care provider that can promote health and wellness. This includes:  A yearly physical exam. This is also called an annual well check.  Regular dental and eye exams.  Immunizations.  Screening for certain conditions.  Healthy lifestyle choices, such as diet and exercise. What can I expect for my preventive care visit? Physical exam Your health care provider will check:  Height and weight. These Borges be used to calculate body mass index (BMI), which is a measurement that tells if you are at a healthy weight.  Heart rate and blood pressure.  Your skin for abnormal spots. Counseling Your health care provider Templin ask you questions about:  Alcohol, tobacco, and drug use.  Emotional well-being.  Home and relationship well-being.  Sexual activity.  Eating habits.  History of falls.  Memory and ability to understand (cognition).  Work and work Statistician.  Pregnancy and menstrual history. What immunizations do I need?  Influenza (flu) vaccine  This is recommended every year. Tetanus, diphtheria, and pertussis (Tdap) vaccine  You Sanjurjo need a Td booster every 10 years. Varicella (chickenpox) vaccine  You Sult need this vaccine if you have not already been vaccinated. Zoster (shingles) vaccine  You Westerhold need this after age 84. Pneumococcal conjugate (PCV13) vaccine  One dose is recommended after age 84. Pneumococcal polysaccharide (PPSV23) vaccine  One dose is recommended after age 84. Measles, mumps, and rubella (MMR) vaccine  You Huestis need at least one dose of MMR if you were born in 1957 or later. You Delaughter also need a second dose. Meningococcal conjugate (MenACWY) vaccine  You Demicco need this if you have certain conditions. Hepatitis A vaccine  You Lagan need this if you have certain conditions or if you travel or work in places where you Fifield be exposed  to hepatitis A. Hepatitis B vaccine  You Watts need this if you have certain conditions or if you travel or work in places where you Niemczyk be exposed to hepatitis B. Haemophilus influenzae type b (Hib) vaccine  You Kosh need this if you have certain conditions. You Bircher receive vaccines as individual doses or as more than one vaccine together in one shot (combination vaccines). Talk with your health care provider about the risks and benefits of combination vaccines. What tests do I need? Blood tests  Lipid and cholesterol levels. These Cannell be checked every 5 years, or more frequently depending on your overall health.  Hepatitis C test.  Hepatitis B test. Screening  Lung cancer screening. You Lento have this screening every year starting at age 84 if you have a 30-pack-year history of smoking and currently smoke or have quit within the past 15 years.  Colorectal cancer screening. All adults should have this screening starting at age 84 and continuing until age 84. Your health care provider Adamek recommend screening at age 84 if you are at increased risk. You will have tests every 1-10 years, depending on your results and the type of screening test.  Diabetes screening. This is done by checking your blood sugar (glucose) after you have not eaten for a while (fasting). You Gallego have this done every 1-3 years.  Mammogram. This Janosik be done every 1-2 years. Talk with your health care provider about how often you should have regular mammograms.  BRCA-related cancer screening. This Ayars be done if you have a family history of breast, ovarian, tubal, or peritoneal cancers.  Other tests  Sexually transmitted disease (STD) testing.  Bone density scan. This is done to screen for osteoporosis. You Altamura have this done starting at age 84. Follow these instructions at home: Eating and drinking  Eat a diet that includes fresh fruits and vegetables, whole grains, lean protein, and low-fat dairy products. Limit  your intake of foods with high amounts of sugar, saturated fats, and salt.  Take vitamin and mineral supplements as recommended by your health care provider.  Do not drink alcohol if your health care provider tells you not to drink.  If you drink alcohol: ? Limit how much you have to 0-1 drink a day. ? Be aware of how much alcohol is in your drink. In the [place] one drink equals one 12 oz bottle of beer (355 mL), one 5 oz glass of wine (148 mL), or one 1 oz glass of hard liquor (44 mL). Lifestyle  Take daily care of your teeth and gums.  Stay active. Exercise for at least 30 minutes on 5 or more days each week.  Do not use any products that contain nicotine or tobacco, such as cigarettes, e-cigarettes, and chewing tobacco. If you need help quitting, ask your health care provider.  If you are sexually active, practice safe sex. Use a condom or other form of protection in order to prevent STIs (sexually transmitted infections).  Talk with your health care provider about taking a low-dose aspirin or statin. What's next?  Go to your health care provider once a year for a well check visit.  Ask your health care provider how often you should have your eyes and teeth checked.  Stay up to date on all vaccines. This information is not intended to replace advice given to you by your health care provider. Make sure you discuss any questions you have with your health care provider. Document Revised: 05/19/2018 Document Reviewed: 05/19/2018 Elsevier Patient Education  2020 [employer].

## 2020-04-18 ENCOUNTER — Telehealth: Payer: Self-pay

## 2020-04-18 LAB — CBC WITH DIFFERENTIAL/PLATELET
Basophils Absolute: 0 10*3/uL (ref 0.0–0.2)
Basos: 1 %
EOS (ABSOLUTE): 0.3 10*3/uL (ref 0.0–0.4)
Eos: 5 %
Hematocrit: 42.3 % (ref 34.0–46.6)
Hemoglobin: 14.7 g/dL (ref 11.1–15.9)
Immature Grans (Abs): 0 10*3/uL (ref 0.0–0.1)
Immature Granulocytes: 0 %
Lymphocytes Absolute: 1.3 10*3/uL (ref 0.7–3.1)
Lymphs: 24 %
MCH: 30.3 pg (ref 26.6–33.0)
MCHC: 34.8 g/dL (ref 31.5–35.7)
MCV: 87 fL (ref 79–97)
Monocytes Absolute: 0.5 10*3/uL (ref 0.1–0.9)
Monocytes: 9 %
Neutrophils Absolute: 3.4 10*3/uL (ref 1.4–7.0)
Neutrophils: 61 %
Platelets: 245 10*3/uL (ref 150–450)
RBC: 4.85 x10E6/uL (ref 3.77–5.28)
RDW: 12.3 % (ref 11.7–15.4)
WBC: 5.5 10*3/uL (ref 3.4–10.8)

## 2020-04-18 LAB — COMPREHENSIVE METABOLIC PANEL
ALT: 19 IU/L (ref 0–32)
AST: 21 IU/L (ref 0–40)
Albumin/Globulin Ratio: 1.8 (ref 1.2–2.2)
Albumin: 4.4 g/dL (ref 3.6–4.6)
Alkaline Phosphatase: 108 IU/L (ref 44–121)
BUN/Creatinine Ratio: 18 (ref 12–28)
BUN: 19 mg/dL (ref 8–27)
Bilirubin Total: 0.4 mg/dL (ref 0.0–1.2)
CO2: 26 mmol/L (ref 20–29)
Calcium: 9.8 mg/dL (ref 8.7–10.3)
Chloride: 103 mmol/L (ref 96–106)
Creatinine, Ser: 1.04 mg/dL — ABNORMAL HIGH (ref 0.57–1.00)
GFR calc Af Amer: 57 mL/min/{1.73_m2} — ABNORMAL LOW (ref >59)
GFR calc non Af Amer: 49 mL/min/{1.73_m2} — ABNORMAL LOW (ref >59)
Globulin, Total: 2.5 g/dL (ref 1.5–4.5)
Glucose: 98 mg/dL (ref 65–99)
Potassium: 4.3 mmol/L (ref 3.5–5.2)
Sodium: 142 mmol/L (ref 134–144)
Total Protein: 6.9 g/dL (ref 6.0–8.5)

## 2020-04-18 LAB — LIPID PANEL
Chol/HDL Ratio: 4.7 ratio — ABNORMAL HIGH (ref 0.0–4.4)
Cholesterol, Total: 246 mg/dL — ABNORMAL HIGH (ref 100–199)
HDL: 52 mg/dL (ref >39)
LDL Chol Calc (NIH): 169 mg/dL — ABNORMAL HIGH (ref 0–99)
Triglycerides: 139 mg/dL (ref 0–149)
VLDL Cholesterol Cal: 25 mg/dL (ref 5–40)

## 2020-04-18 NOTE — Telephone Encounter (Signed)
Pt advised.   Thanks,   -Kolston Lacount  

## 2020-04-18 NOTE — Telephone Encounter (Signed)
-----   Message from Mar Daring, PA-C sent at 04/18/2020  8:41 AM EST ----- Labs are all normal and stable. Cholesterol is elevated but stable.

## 2020-04-18 NOTE — Telephone Encounter (Signed)
Pt advised.   Thanks,   -Stayce Delancy  

## 2020-04-19 ENCOUNTER — Telehealth: Payer: Self-pay

## 2020-04-19 LAB — URINE CULTURE

## 2020-04-19 NOTE — Telephone Encounter (Signed)
-----   Message from Mar Daring, [place] sent at 04/19/2020  2:35 PM EST ----- Urine culture is normal. No bacteria causing infection noted.

## 2020-04-19 NOTE — Telephone Encounter (Signed)
Called patient and no answer and could not leave a vm due to no vm upset.

## 2020-04-23 ENCOUNTER — Encounter: Payer: Self-pay | Admitting: Physician Assistant

## 2020-04-23 NOTE — Telephone Encounter (Signed)
Called NA/not able to LM-If patient calls OK for [facility] Nurse to give urine lab results

## 2020-04-29 DIAGNOSIS — H401131 Primary open-angle glaucoma, bilateral, mild stage: Secondary | ICD-10-CM | POA: Diagnosis not present

## 2020-06-06 ENCOUNTER — Other Ambulatory Visit: Payer: Self-pay | Admitting: Physician Assistant

## 2020-06-06 DIAGNOSIS — I10 Essential (primary) hypertension: Secondary | ICD-10-CM

## 2020-06-14 DIAGNOSIS — H401131 Primary open-angle glaucoma, bilateral, mild stage: Secondary | ICD-10-CM | POA: Diagnosis not present

## 2020-06-21 DIAGNOSIS — H401133 Primary open-angle glaucoma, bilateral, severe stage: Secondary | ICD-10-CM | POA: Diagnosis not present

## 2020-07-16 ENCOUNTER — Telehealth: Payer: Self-pay | Admitting: Physician Assistant

## 2020-07-16 DIAGNOSIS — F4323 Adjustment disorder with mixed anxiety and depressed mood: Secondary | ICD-10-CM

## 2020-07-16 NOTE — Telephone Encounter (Signed)
Please advise 

## 2020-07-16 NOTE — Telephone Encounter (Signed)
Pt states the anti depressant she has been taking is not working all that well, and wants to know if she can take 1 1/2 of it? The sertraline (ZOLOFT) 100 MG tablet

## 2020-07-17 MED ORDER — SERTRALINE HCL 100 MG PO TABS: 150.0000 mg | ORAL_TABLET | Freq: Every day | ORAL | 3 refills | Status: DC

## 2020-07-17 NOTE — Telephone Encounter (Signed)
Yes she can increase to 150mg  (1.5 tabs)

## 2020-07-17 NOTE — Telephone Encounter (Signed)
Patient advised as below.  

## 2020-07-17 NOTE — Telephone Encounter (Signed)
Pt stated no one ever returned her call and she would like to know if it is ok for her to take 1 and 1/2 of the sertraline 100 mg tablet. Pt requests call back asap

## 2020-07-31 ENCOUNTER — Ambulatory Visit: Payer: Self-pay

## 2020-07-31 ENCOUNTER — Other Ambulatory Visit: Payer: Self-pay | Admitting: Physician Assistant

## 2020-07-31 MED ORDER — SERTRALINE HCL 50 MG PO TABS: 50.0000 mg | ORAL_TABLET | Freq: Every day | ORAL | 0 refills | Status: DC

## 2020-07-31 NOTE — Telephone Encounter (Signed)
Pt called in and wanted to talk to the nurse regarding her medication and doses / splitting pills in half.  Pt. States she was told by PCP to cut Sertraline in half to add to daily dose. States the pill crumbles when cut. Her pharmacy recommends 50 mg be sent to pharmacy for pt. Please advise pt. Answer Assessment - Initial Assessment Questions 1. NAME of MEDICATION: "What medicine are you calling about?"     Sertaline 2. QUESTION: "What is your question?" (e.g., medication refill, side effect)     Pt. States she can not cut pill in half, it crumbles. 3. PRESCRIBING HCP: "Who prescribed it?" Reason: if prescribed by specialist, call should be referred to that group.     Fenton Malling 4. SYMPTOMS: "Do you have any symptoms?"     n/a 5. SEVERITY: If symptoms are present, ask "Are they mild, moderate or severe?"     n/a 6. PREGNANCY:  "Is there any chance that you are pregnant?" "When was your last menstrual period?"     No  Protocols used: MEDICATION QUESTION CALL-A-AH

## 2020-07-31 NOTE — Telephone Encounter (Signed)
Sent in 50mg 

## 2020-08-20 ENCOUNTER — Other Ambulatory Visit: Payer: Self-pay | Admitting: Physician Assistant

## 2020-08-20 DIAGNOSIS — G4700 Insomnia, unspecified: Secondary | ICD-10-CM

## 2020-08-20 NOTE — Telephone Encounter (Signed)
Requested Prescriptions  Pending Prescriptions Disp Refills  . traZODone (DESYREL) 100 MG tablet [Pharmacy Med Name: TRAZODONE HCL 100 MG TAB] 90 tablet 3    Sig: TAKE ONE TABLET BY MOUTH AT BEDTIME     Psychiatry: Antidepressants - Serotonin Modulator Failed - 08/20/2020 10:11 AM      Failed - Valid encounter within last 6 months    Recent Outpatient Visits          6 months ago Influenza vaccination declined   [facility], [place], [place]   7 months ago Essential hypertension   [place], [place], [place]   1 year ago Depression, major, single episode, mild [facility])   [place], Clearnce Sorrel, PA-C   1 year ago Depression, recurrent [facility])   [place], Clearnce Sorrel, [place]   1 year ago [place], Clearnce Sorrel, PA-C      Future Appointments            In 6 days [place], Clearnce Sorrel, PA-C [employer]           Passed - Completed PHQ-2 or PHQ-9 in the last 360 days

## 2020-08-26 ENCOUNTER — Encounter: Payer: Self-pay | Admitting: Physician Assistant

## 2020-08-26 ENCOUNTER — Other Ambulatory Visit: Payer: Self-pay

## 2020-08-26 ENCOUNTER — Ambulatory Visit (INDEPENDENT_AMBULATORY_CARE_PROVIDER_SITE_OTHER): Payer: PPO | Admitting: Physician Assistant

## 2020-08-26 VITALS — BP 174/79 | HR 89 | Temp 97.8°F | Wt 156.0 lb

## 2020-08-26 DIAGNOSIS — D175 Benign lipomatous neoplasm of intra-abdominal organs: Secondary | ICD-10-CM | POA: Diagnosis not present

## 2020-08-26 DIAGNOSIS — K5904 Chronic idiopathic constipation: Secondary | ICD-10-CM

## 2020-08-26 DIAGNOSIS — R5381 Other malaise: Secondary | ICD-10-CM

## 2020-08-26 DIAGNOSIS — R350 Frequency of micturition: Secondary | ICD-10-CM

## 2020-08-26 DIAGNOSIS — R5383 Other fatigue: Secondary | ICD-10-CM

## 2020-08-26 DIAGNOSIS — D369 Benign neoplasm, unspecified site: Secondary | ICD-10-CM | POA: Diagnosis not present

## 2020-08-26 NOTE — Progress Notes (Signed)
Established patient visit   Patient: Jordan Gordon   DOB: [birth year]   86 y.o. Female  MRN: [id number] Visit Date: 08/26/2020  Today's healthcare provider: Mar Daring, PA-C   Chief Complaint  Patient presents with  . Constipation   Subjective    Constipation This is a chronic problem. The problem is unchanged. Associated symptoms include abdominal pain, back pain and bloating. Pertinent negatives include no anorexia, diarrhea, fecal incontinence, fever, nausea, rectal pain or vomiting.    Per Pt.  "I'm afraid I have cancer. I'm constipated and when I do go my stool is dark."  "I stay tired all the time."  Patient Active Problem List   Diagnosis Date Noted  . Major depressive disorder, recurrent episode, moderate ([place]) 01/13/2019  . Mechanical and motor problems with internal organs 04/18/2015  . Abnormal foot pulse 04/18/2015  . Clinical depression 04/18/2015  . Fatigue 04/18/2015  . Blood glucose elevated 04/18/2015  . Decreased potassium in the blood 04/18/2015  . Low back pain 04/18/2015  . Paresthesia of skin 04/18/2015  . Gastro-esophageal reflux disease with esophagitis 04/18/2015  . Anxiety 01/07/2015  . Chest pain 09/20/2014  . History of cardiac catheterization 03/08/2014  . Disorder resulting from impaired renal function 07/29/2009  . Muscle ache 03/15/2009  . SOB (shortness of breath) on exertion 09/26/2008  . Adaptation reaction 07/21/2006  . Osteoarthritis 07/21/2006  . 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 Essential (primary) hypertension 04/16/1998  . Allergic rhinitis 03/12/1998  . Insomnia 03/04/1998  . Menopausal and perimenopausal disorder 02/02/1997  . HLD (hyperlipidemia) 12/06/1991   Past Medical History:  Diagnosis Date  . Anxiety   . Coronary artery disease   . GERD (gastroesophageal reflux disease)   . HOH (hard of hearing)   . Hyperlipidemia   . Hypertension   . Major depressive disorder    Social History    Tobacco Use  . Smoking status: Never Smoker  . Smokeless tobacco: Never Used  Vaping Use  . Vaping Use: Never used  Substance Use Topics  . Alcohol use: Yes    Alcohol/week: 0.0 standard drinks    Comment: wine once every 2 weeks  . Drug use: No   Allergies  Allergen Reactions  . Codeine     nausea     Medications: Outpatient Medications Prior to Visit  Medication Sig  . ALPRAZolam (XANAX) 0.25 MG tablet TAKE ONE TABLET 3 TIMES DAILY AS NEEDED FOR ANXIETY  . brimonidine (ALPHAGAN) 0.2 % ophthalmic solution 1 drop 3 (three) times daily.  . dorzolamide-timolol (COSOPT) 22.3-6.8 MG/ML ophthalmic solution 1 drop 2 (two) times daily.  Marland Kitchen latanoprost (XALATAN) 0.005 % ophthalmic solution Place 1 drop into the right eye at bedtime.  Marland Kitchen losartan (COZAAR) 100 MG tablet Take 1 tablet (100 mg total) by mouth daily.  . mupirocin cream (BACTROBAN) 2 % Apply 1 application topically 2 (two) times daily as needed.  . sertraline (ZOLOFT) 100 MG tablet Take 1.5 tablets (150 mg total) by mouth daily.  . traZODone (DESYREL) 100 MG tablet TAKE ONE TABLET BY MOUTH AT BEDTIME  . triamterene-hydrochlorothiazide (MAXZIDE-25) 37.5-25 MG tablet TAKE ONE TABLET BY MOUTH EVERY DAY  . cephALEXin (KEFLEX) 500 MG capsule Take 1 capsule (500 mg total) by mouth 2 (two) times daily.  . potassium chloride (KLOR-CON) 10 MEQ tablet TAKE 1.5 TABLETS TWICE DAILY (Patient not taking: Reported on 08/26/2020)  . [DISCONTINUED] sertraline (ZOLOFT) 50 MG tablet Take 1 tablet (50 mg total)  by mouth daily. Take with 126m daily   No facility-administered medications prior to visit.    Review of Systems  Constitutional: Positive for activity change and fatigue. Negative for appetite change, chills, diaphoresis, fever and unexpected weight change.  Respiratory: Negative.   Cardiovascular: Negative.   Gastrointestinal: Positive for abdominal distention, abdominal pain, bloating and constipation. Negative for anal bleeding,  anorexia, blood in stool, diarrhea, nausea, rectal pain and vomiting.  Musculoskeletal: Positive for back pain.  Neurological: Positive for dizziness (Occasionally). Negative for light-headedness and headaches.        Objective    BP (!) 174/79 (BP Location: Left Arm, Patient Position: Sitting, Cuff Size: Large)   Pulse 89   Temp 97.8 F (36.6 C) (Oral)   Wt 156 lb (70.8 kg)   BMI 24.43 kg/m    Physical Exam Vitals reviewed.  Constitutional:      General: She is not in acute distress.    Appearance: Normal appearance. She is well-developed and normal weight. She is not ill-appearing or diaphoretic.  HENT:     Head: Normocephalic and atraumatic.  Cardiovascular:     Rate and Rhythm: Normal rate and regular rhythm.     Heart sounds: Normal heart sounds. No murmur heard. No friction rub. No gallop.   Pulmonary:     Effort: Pulmonary effort is normal. No respiratory distress.     Breath sounds: Normal breath sounds. No wheezing or rales.  Abdominal:     General: Abdomen is protuberant. Bowel sounds are normal. There is no distension.     Palpations: Abdomen is soft. There is no mass.     Tenderness: There is abdominal tenderness in the left lower quadrant. There is no guarding or rebound.  Skin:    General: Skin is warm and dry.  Neurological:     Mental Status: She is alert and oriented to person, place, and time.  Psychiatric:        Mood and Affect: Mood normal.        Thought Content: Thought content normal.      Results for orders placed or performed in visit on 08/26/20  CBC w/Diff/Platelet  Result Value Ref Range   WBC 6.3 3.4 - 10.8 x10E3/uL   RBC 4.80 3.77 - 5.28 x10E6/uL   Hemoglobin 14.8 11.1 - 15.9 g/dL   Hematocrit 41.3 34.0 - 46.6 %   MCV 86 79 - 97 fL   MCH 30.8 26.6 - 33.0 pg   MCHC 35.8 (H) 31.5 - 35.7 g/dL   RDW 12.9 11.7 - 15.4 %   Platelets 206 150 - 450 x10E3/uL   Neutrophils 70 Not Estab. %   Lymphs 22 Not Estab. %   Monocytes 7 Not Estab.  %   Eos 1 Not Estab. %   Basos 0 Not Estab. %   Neutrophils Absolute 4.4 1.4 - 7.0 x10E3/uL   Lymphocytes Absolute 1.4 0.7 - 3.1 x10E3/uL   Monocytes Absolute 0.4 0.1 - 0.9 x10E3/uL   EOS (ABSOLUTE) 0.1 0.0 - 0.4 x10E3/uL   Basophils Absolute 0.0 0.0 - 0.2 x10E3/uL   Immature Granulocytes 0 Not Estab. %   Immature Grans (Abs) 0.0 0.0 - 0.1 x10E3/uL  Comprehensive Metabolic Panel (CMET)  Result Value Ref Range   Glucose 137 (H) 65 - 99 mg/dL   BUN 17 8 - 27 mg/dL   Creatinine, Ser 1.09 (H) 0.57 - 1.00 mg/dL   eGFR 50 (L) >59 mL/min/1.73   BUN/Creatinine Ratio 16 12 - 28  Sodium 141 134 - 144 mmol/L   Potassium 3.4 (L) 3.5 - 5.2 mmol/L   Chloride 102 96 - 106 mmol/L   CO2 23 20 - 29 mmol/L   Calcium 9.7 8.7 - 10.3 mg/dL   Total Protein 7.0 6.0 - 8.5 g/dL   Albumin 4.6 3.6 - 4.6 g/dL   Globulin, Total 2.4 1.5 - 4.5 g/dL   Albumin/Globulin Ratio 1.9 1.2 - 2.2   Bilirubin Total 0.4 0.0 - 1.2 mg/dL   Alkaline Phosphatase 85 44 - 121 IU/L   AST 23 0 - 40 IU/L   ALT 23 0 - 32 IU/L    Assessment & Plan     1. Chronic idiopathic constipation Worsening. Patient going to start trying Miralax, has bought it but not started yet. Will check labs as below and f/u pending results. Referral to GI. Last colonoscopy was in 2018 with Dr. Vira Agar. She had multiple tubular adenomas (largest 38m) and a large lipoma in the transverse colon. These all could contribute to her constipation worsening. Patient was advised she Haris not be a candidate for a colonoscopy due to her age, but that we could refer for further evaluation as they Var[facility] have other methods for screening. I have referred her back to [facility] this is where her records are. Consult appreciated.  - CBC w/Diff/Platelet - Comprehensive Metabolic Panel (CMET) - Ambulatory referral to Gastroenterology  2. Tubular adenoma See above medical treatment plan. - CBC w/Diff/Platelet - Comprehensive Metabolic Panel (CMET) - Ambulatory referral  to Gastroenterology  3. Lipoma of colon See above medical treatment plan. - CBC w/Diff/Platelet - Comprehensive Metabolic Panel (CMET) - Ambulatory referral to Gastroenterology  4. Urine frequency UA today is normal.  - CBC w/Diff/Platelet - Comprehensive Metabolic Panel (CMET) - POCT Urinalysis Dipstick  5. Malaise and fatigue Will check labs as below and f/u pending results. - CBC w/Diff/Platelet - Comprehensive Metabolic Panel (CMET)   No follow-ups on file.      IReynolds Bowl PA-C, have reviewed all documentation for this visit. The documentation on 08/27/20 for the exam, diagnosis, procedures, and orders are all accurate and complete.   JRubye Beach [phone](phone) [phone](fax)  [place]

## 2020-08-26 NOTE — Patient Instructions (Signed)
Constipation, Adult Constipation is when a person has fewer than three bowel movements in a week, has difficulty having a bowel movement, or has stools (feces) that are dry, hard, or larger than normal. Constipation Redd be caused by an underlying condition. It Gajda become worse with age if a person takes certain medicines and does not take in enough fluids. Follow these instructions at home: Eating and drinking  Eat foods that have a lot of fiber, such as beans, whole grains, and fresh fruits and vegetables.  Limit foods that are low in fiber and high in fat and processed sugars, such as fried or sweet foods. These include french fries, hamburgers, cookies, candies, and soda.  Drink enough fluid to keep your urine pale yellow.   General instructions  Exercise regularly or as told by your health care provider. Try to do 150 minutes of moderate exercise each week.  Use the bathroom when you have the urge to go. Do not hold it in.  Take over-the-counter and prescription medicines only as told by your health care provider. This includes any fiber supplements.  During bowel movements: ? Practice deep breathing while relaxing the lower abdomen. ? Practice pelvic floor relaxation.  Watch your condition for any changes. Let your health care provider know about them.  Keep all follow-up visits as told by your health care provider. This is important. Contact a health care provider if:  You have pain that gets worse.  You have a fever.  You do not have a bowel movement after 4 days.  You vomit.  You are not hungry or you lose weight.  You are bleeding from the opening between the buttocks (anus).  You have thin, pencil-like stools. Get help right away if:  You have a fever and your symptoms suddenly get worse.  You leak stool or have blood in your stool.  Your abdomen is bloated.  You have severe pain in your abdomen.  You feel dizzy or you faint. Summary  Constipation is  when a person has fewer than three bowel movements in a week, has difficulty having a bowel movement, or has stools (feces) that are dry, hard, or larger than normal.  Eat foods that have a lot of fiber, such as beans, whole grains, and fresh fruits and vegetables.  Drink enough fluid to keep your urine pale yellow.  Take over-the-counter and prescription medicines only as told by your health care provider. This includes any fiber supplements. This information is not intended to replace advice given to you by your health care provider. Make sure you discuss any questions you have with your health care provider. Document Revised: 04/12/2019 Document Reviewed: 04/12/2019 Elsevier Patient Education  2021 Elsevier Inc.  

## 2020-08-27 ENCOUNTER — Encounter: Payer: Self-pay | Admitting: Physician Assistant

## 2020-08-27 LAB — COMPREHENSIVE METABOLIC PANEL
ALT: 23 IU/L (ref 0–32)
AST: 23 IU/L (ref 0–40)
Albumin/Globulin Ratio: 1.9 (ref 1.2–2.2)
Albumin: 4.6 g/dL (ref 3.6–4.6)
Alkaline Phosphatase: 85 IU/L (ref 44–121)
BUN/Creatinine Ratio: 16 (ref 12–28)
BUN: 17 mg/dL (ref 8–27)
Bilirubin Total: 0.4 mg/dL (ref 0.0–1.2)
CO2: 23 mmol/L (ref 20–29)
Calcium: 9.7 mg/dL (ref 8.7–10.3)
Chloride: 102 mmol/L (ref 96–106)
Creatinine, Ser: 1.09 mg/dL — ABNORMAL HIGH (ref 0.57–1.00)
Globulin, Total: 2.4 g/dL (ref 1.5–4.5)
Glucose: 137 mg/dL — ABNORMAL HIGH (ref 65–99)
Potassium: 3.4 mmol/L — ABNORMAL LOW (ref 3.5–5.2)
Sodium: 141 mmol/L (ref 134–144)
Total Protein: 7 g/dL (ref 6.0–8.5)
eGFR: 50 mL/min/{1.73_m2} — ABNORMAL LOW (ref >59)

## 2020-08-27 LAB — CBC WITH DIFFERENTIAL/PLATELET
Basophils Absolute: 0 10*3/uL (ref 0.0–0.2)
Basos: 0 %
EOS (ABSOLUTE): 0.1 10*3/uL (ref 0.0–0.4)
Eos: 1 %
Hematocrit: 41.3 % (ref 34.0–46.6)
Hemoglobin: 14.8 g/dL (ref 11.1–15.9)
Immature Grans (Abs): 0 10*3/uL (ref 0.0–0.1)
Immature Granulocytes: 0 %
Lymphocytes Absolute: 1.4 10*3/uL (ref 0.7–3.1)
Lymphs: 22 %
MCH: 30.8 pg (ref 26.6–33.0)
MCHC: 35.8 g/dL — ABNORMAL HIGH (ref 31.5–35.7)
MCV: 86 fL (ref 79–97)
Monocytes Absolute: 0.4 10*3/uL (ref 0.1–0.9)
Monocytes: 7 %
Neutrophils Absolute: 4.4 10*3/uL (ref 1.4–7.0)
Neutrophils: 70 %
Platelets: 206 10*3/uL (ref 150–450)
RBC: 4.8 x10E6/uL (ref 3.77–5.28)
RDW: 12.9 % (ref 11.7–15.4)
WBC: 6.3 10*3/uL (ref 3.4–10.8)

## 2020-08-27 LAB — POCT URINALYSIS DIPSTICK
Appearance: NORMAL
Bilirubin, UA: NEGATIVE
Blood, UA: NEGATIVE
Glucose, UA: NEGATIVE
Ketones, UA: NEGATIVE
Leukocytes, UA: NEGATIVE
Nitrite, UA: NEGATIVE
Protein, UA: NEGATIVE
Spec Grav, UA: 1.02 (ref 1.010–1.025)
Urobilinogen, UA: 0.2 E.U./dL
pH, UA: 6 (ref 5.0–8.0)

## 2020-08-28 ENCOUNTER — Telehealth: Payer: Self-pay

## 2020-08-28 DIAGNOSIS — E876 Hypokalemia: Secondary | ICD-10-CM

## 2020-08-28 NOTE — Telephone Encounter (Signed)
Pt just needed to know if her referral to GI was done.  I advised she will hear for the referral coordinator once the appointment has been scheduled.   Thanks,   -Mickel Baas

## 2020-08-28 NOTE — Telephone Encounter (Signed)
Pt advised.  Future order has been placed.   Thanks,   -Mickel Baas

## 2020-08-28 NOTE — Telephone Encounter (Signed)
Copied from [place] [id number]. Topic: General - Other >> Aug 28, 2020 10:06 AM Yvette Rack wrote: Reason for CRM: Pt stated she was just speaking with Jenni's nurse but she forgot to ask her something. Pt requests call back.

## 2020-08-28 NOTE — Telephone Encounter (Signed)
-----   Message from Mar Daring, [place] sent at 08/28/2020  8:25 AM EDT ----- Vickii Chafe,  Potassium is borderline low. Would recommend to increase dietary potassium such as bananas, sweet potatoes and/or leafy greens (spinach, kale) in diet. All other labs are unremarkable. Can recheck potassium in 2-4 weeks.   Grace Bushy, [facility]

## 2020-09-02 ENCOUNTER — Telehealth: Payer: Self-pay | Admitting: Physician Assistant

## 2020-09-02 ENCOUNTER — Other Ambulatory Visit: Payer: Self-pay | Admitting: Physician Assistant

## 2020-09-02 DIAGNOSIS — K921 Melena: Secondary | ICD-10-CM | POA: Diagnosis not present

## 2020-09-02 DIAGNOSIS — R1084 Generalized abdominal pain: Secondary | ICD-10-CM | POA: Diagnosis not present

## 2020-09-02 DIAGNOSIS — F419 Anxiety disorder, unspecified: Secondary | ICD-10-CM

## 2020-09-02 DIAGNOSIS — R5383 Other fatigue: Secondary | ICD-10-CM | POA: Diagnosis not present

## 2020-09-02 MED ORDER — ALPRAZOLAM 0.25 MG PO TABS
ORAL_TABLET | ORAL | 5 refills | Status: DC
Start: 2020-09-02 — End: 2021-03-13

## 2020-09-02 NOTE — Telephone Encounter (Signed)
Requested medication (s) are due for refill today- yes  Requested medication (s) are on the active medication list -yes  Future visit scheduled -yes  Last refill: 02/08/20 #90 5 RF  Notes to clinic: Request non delegated Rx  Requested Prescriptions  Pending Prescriptions Disp Refills   ALPRAZolam (XANAX) 0.25 MG tablet [Pharmacy Med Name: ALPRAZOLAM 0.25 MG TAB] 90 tablet     Sig: TAKE ONE TABLET 3 TIMES DAILY AS NEEDED FOR ANXIETY      Not Delegated - Psychiatry:  Anxiolytics/Hypnotics Failed - 09/02/2020  1:22 PM      Failed - This refill cannot be delegated      Failed - Urine Drug Screen completed in last 360 days      Passed - Valid encounter within last 6 months    Recent Outpatient Visits           1 week ago Chronic idiopathic constipation   [facility] [place], Clearnce Sorrel, PA-C   6 months ago Influenza vaccination declined   [facility], [place], [place]   7 months ago Essential hypertension   [place], [place], PA-C   1 year ago Depression, major, single episode, mild [facility])   [place], [place], PA-C   1 year ago Depression, recurrent [facility])   [place], Clearnce Sorrel, [place]       Future Appointments             In 2 months Bacigal[place], Dionne Bucy, MD [facility]                 Requested Prescriptions  Pending Prescriptions Disp Refills   ALPRAZolam (XANAX) 0.25 MG tablet [Pharmacy Med Name: ALPRAZOLAM 0.25 MG TAB] 90 tablet     Sig: TAKE ONE TABLET 3 TIMES DAILY AS NEEDED FOR ANXIETY      Not Delegated - Psychiatry:  Anxiolytics/Hypnotics Failed - 09/02/2020  1:22 PM      Failed - This refill cannot be delegated      Failed - Urine Drug Screen completed in last 360 days      Passed - Valid encounter within last 6 months    Recent Outpatient Visits           1 week ago Chronic idiopathic constipation    [place], Clearnce Sorrel, PA-C   6 months ago Influenza vaccination declined   [facility], [place], [place]   7 months ago Essential hypertension   [place], [place], [place]   1 year ago Depression, major, single episode, mild [facility])   [place], [place], PA-C   1 year ago Depression, recurrent [facility])   [place], Clearnce Sorrel, [place]       Future Appointments             In 2 months Bacigalupo, Dionne Bucy, MD [facility], [facility]

## 2020-09-02 NOTE — Telephone Encounter (Signed)
Patient has made an additional call regarding their prescription Patient would like to be notified when it is approved and submitted to the pharmacy Please contact to further advise if needed

## 2020-09-02 NOTE — Telephone Encounter (Signed)
Patient called in for refill but request has already been sent to provider for refill. Please advise

## 2020-09-02 NOTE — Telephone Encounter (Signed)
Alprazolam refilled.  

## 2020-09-02 NOTE — Telephone Encounter (Signed)
Medication: ALPRAZolam (XANAX) 0.25 MG tablet [[id number]]   Has the patient contacted their pharmacy? YES  (Agent: If no, request that the patient contact the pharmacy for the refill.) (Agent: If yes, when and what did the pharmacy advise?)  Preferred Pharmacy (with phone number or street name): [place], [place] [address] [place] [address] Phone: [phone] Fax: [phone] Hours: Not open 24 hours    Agent: Please be advised that RX refills [place] take up to 3 business days. We ask that you follow-up with your pharmacy.

## 2020-09-13 DIAGNOSIS — H401133 Primary open-angle glaucoma, bilateral, severe stage: Secondary | ICD-10-CM | POA: Diagnosis not present

## 2020-09-16 ENCOUNTER — Other Ambulatory Visit: Payer: Self-pay | Admitting: Physician Assistant

## 2020-09-16 ENCOUNTER — Other Ambulatory Visit: Payer: PPO | Attending: General Surgery

## 2020-09-16 DIAGNOSIS — G4700 Insomnia, unspecified: Secondary | ICD-10-CM

## 2020-09-18 ENCOUNTER — Ambulatory Visit
Admission: RE | Admit: 2020-09-18 | Discharge: 2020-09-18 | Disposition: A | Discharge status: Home / Self Care | Payer: PPO | Attending: General Surgery | Admitting: General Surgery

## 2020-09-18 ENCOUNTER — Ambulatory Visit: Payer: PPO | Admitting: Certified Registered Nurse Anesthetist

## 2020-09-18 ENCOUNTER — Other Ambulatory Visit: Payer: Self-pay

## 2020-09-18 ENCOUNTER — Encounter
Admission: RE | Disposition: A | Discharge status: Home / Self Care | Payer: Self-pay | Source: Home / Self Care | Attending: General Surgery

## 2020-09-18 DIAGNOSIS — Z1211 Encounter for screening for malignant neoplasm of colon: Secondary | ICD-10-CM | POA: Diagnosis not present

## 2020-09-18 DIAGNOSIS — K624 Stenosis of anus and rectum: Secondary | ICD-10-CM | POA: Diagnosis not present

## 2020-09-18 DIAGNOSIS — K635 Polyp of colon: Secondary | ICD-10-CM | POA: Diagnosis not present

## 2020-09-18 DIAGNOSIS — Z885 Allergy status to narcotic agent status: Secondary | ICD-10-CM | POA: Diagnosis not present

## 2020-09-18 DIAGNOSIS — K2950 Unspecified chronic gastritis without bleeding: Secondary | ICD-10-CM | POA: Diagnosis not present

## 2020-09-18 DIAGNOSIS — I10 Essential (primary) hypertension: Secondary | ICD-10-CM | POA: Insufficient documentation

## 2020-09-18 DIAGNOSIS — Z79899 Other long term (current) drug therapy: Secondary | ICD-10-CM | POA: Insufficient documentation

## 2020-09-18 DIAGNOSIS — D123 Benign neoplasm of transverse colon: Secondary | ICD-10-CM | POA: Insufficient documentation

## 2020-09-18 DIAGNOSIS — F418 Other specified anxiety disorders: Secondary | ICD-10-CM | POA: Diagnosis not present

## 2020-09-18 DIAGNOSIS — E785 Hyperlipidemia, unspecified: Secondary | ICD-10-CM | POA: Diagnosis not present

## 2020-09-18 DIAGNOSIS — K219 Gastro-esophageal reflux disease without esophagitis: Secondary | ICD-10-CM | POA: Insufficient documentation

## 2020-09-18 DIAGNOSIS — K449 Diaphragmatic hernia without obstruction or gangrene: Secondary | ICD-10-CM | POA: Insufficient documentation

## 2020-09-18 DIAGNOSIS — R109 Unspecified abdominal pain: Secondary | ICD-10-CM | POA: Diagnosis not present

## 2020-09-18 DIAGNOSIS — K2970 Gastritis, unspecified, without bleeding: Secondary | ICD-10-CM | POA: Diagnosis not present

## 2020-09-18 DIAGNOSIS — K2100 Gastro-esophageal reflux disease with esophagitis, without bleeding: Secondary | ICD-10-CM | POA: Diagnosis not present

## 2020-09-18 DIAGNOSIS — Z86010 Personal history of colonic polyps: Secondary | ICD-10-CM | POA: Diagnosis not present

## 2020-09-18 DIAGNOSIS — K921 Melena: Secondary | ICD-10-CM | POA: Diagnosis not present

## 2020-09-18 HISTORY — PX: ESOPHAGOGASTRODUODENOSCOPY (EGD) WITH PROPOFOL: SHX5813

## 2020-09-18 HISTORY — PX: COLONOSCOPY WITH PROPOFOL: SHX5780

## 2020-09-18 SURGERY — COLONOSCOPY WITH PROPOFOL
Anesthesia: General

## 2020-09-18 MED ORDER — PROPOFOL 500 MG/50ML IV EMUL
INTRAVENOUS | Status: DC | PRN
  Administered 2020-09-18: 150 ug/kg/min via INTRAVENOUS

## 2020-09-18 MED ORDER — PROPOFOL 10 MG/ML IV BOLUS
INTRAVENOUS | Status: DC | PRN
  Administered 2020-09-18: 60 mg via INTRAVENOUS

## 2020-09-18 MED ORDER — LIDOCAINE HCL (PF) 2 % IJ SOLN
INTRAMUSCULAR | Status: AC
  Filled 2020-09-18: qty 5

## 2020-09-18 MED ORDER — PHENYLEPHRINE HCL (PRESSORS) 10 MG/ML IV SOLN
INTRAVENOUS | Status: AC
  Filled 2020-09-18: qty 1

## 2020-09-18 MED ORDER — PROPOFOL 500 MG/50ML IV EMUL
INTRAVENOUS | Status: AC
  Filled 2020-09-18: qty 250

## 2020-09-18 MED ORDER — GLYCOPYRROLATE 0.2 MG/ML IJ SOLN
INTRAMUSCULAR | Status: AC
  Filled 2020-09-18: qty 1

## 2020-09-18 MED ORDER — PHENYLEPHRINE HCL (PRESSORS) 10 MG/ML IV SOLN
INTRAVENOUS | Status: DC | PRN
  Administered 2020-09-18: 100 ug via INTRAVENOUS

## 2020-09-18 MED ORDER — SODIUM CHLORIDE 0.9 % IV SOLN
INTRAVENOUS | Status: DC
  Administered 2020-09-18: 20 mL/h via INTRAVENOUS

## 2020-09-18 MED ORDER — PROPOFOL 500 MG/50ML IV EMUL
INTRAVENOUS | Status: AC
  Filled 2020-09-18: qty 50

## 2020-09-18 MED ORDER — LIDOCAINE HCL (PF) 2 % IJ SOLN
INTRAMUSCULAR | Status: AC
  Filled 2020-09-18: qty 30

## 2020-09-18 MED ORDER — SODIUM CHLORIDE (PF) 0.9 % IJ SOLN
INTRAMUSCULAR | Status: DC | PRN
  Administered 2020-09-18: 2 mL via INTRAVENOUS

## 2020-09-18 MED ORDER — LIDOCAINE HCL (CARDIAC) PF 100 MG/5ML IV SOSY
PREFILLED_SYRINGE | INTRAVENOUS | Status: DC | PRN
  Administered 2020-09-18: 50 mg via INTRAVENOUS

## 2020-09-18 NOTE — Op Note (Addendum)
[facility] Patient Name: Jordan Gordon Procedure Date: 09/18/2020 7:01 AM MRN: [id number] Account #: [id number] Date of Birth: 1933/11/19 Admit Type: Outpatient Age: 87 Room: [facility] ENDO ROOM 1 Gender: Female Note Status: Supervisor Override Procedure:             Colonoscopy Indications:           High risk colon cancer surveillance: Personal history                         of colonic polyps, Constipation Providers:             Robert Bellow, MD Referring MD:          Mar Daring (Referring MD) Medicines:             Monitored Anesthesia Care Complications:         No immediate complications. Procedure:             Pre-Anesthesia Assessment:                        - Prior to the procedure, a History and Physical was                         performed, and patient medications, allergies and                         sensitivities were reviewed. The patient's tolerance                         of previous anesthesia was reviewed.                        - The risks and benefits of the procedure and the                         sedation options and risks were discussed with the                         patient. All questions were answered and informed                         consent was obtained.                        - Prior to the procedure, a History and Physical was                         performed, and patient medications, allergies and                         sensitivities were reviewed. The patient's tolerance                         of previous anesthesia was reviewed.                        - The risks and benefits of the procedure and the                         sedation  options and risks were discussed with the                         patient. All questions were answered and informed                         consent was obtained.                        After obtaining informed consent, the colonoscope was                          passed under direct vision. Throughout the procedure,                         the patient's blood pressure, pulse, and oxygen                         saturations were monitored continuously. The                         Colonoscope was introduced through the anus and                         advanced to the the terminal ileum. The colonoscopy                         was performed without difficulty. The patient                         tolerated the procedure well. The quality of the bowel                         preparation was excellent. Findings:      The digital rectal exam findings include rectal stricture. Pertinent       negatives include normal sphincter tone.      Two sessile polyps were found in the proximal transverse colon. The       polyps were 7 to 12 mm in size. These polyps were removed with a hot       snare. Resection and retrieval were complete. To prevent bleeding       post-intervention, one hemostatic clip was successfully placed (MR       conditional). There was no bleeding during, or at the end, of the       procedure.      A 8 mm polyp was found in the transverse colon proximal transverse       colon. The polyp was sessile. The polyp was removed with a hot snare.       Resection and retrieval were complete.      Five sessile polyps were found in the distal transverse colon. The       polyps were 8 to 15 mm in size. These polyps were removed with a saline       injection-lift technique using a hot snare. Resection and retrieval were       complete.      The retroflexed view of the distal rectum and anal verge was normal and       showed no anal or rectal abnormalities. Impression:            -  Rectal stricture found on digital rectal exam.                        - Two 7 to 12 mm polyps in the proximal transverse                         colon, removed with a hot snare. Resected and                         retrieved. Clip (MR conditional) was placed.                         - One 8 mm polyp in the transverse colon in the                         proximal transverse colon, removed with a hot snare.                         Resected and retrieved.                        - Five 8 to 15 mm polyps in the distal transverse                         colon, removed using injection-lift and a hot snare.                         Resected and retrieved.                        - The distal rectum and anal verge are normal on                         retroflexion view. Recommendation:        - Telephone endoscopist for pathology results in 1                         week. Procedure Code(s):     --- Professional ---                        [phone], Colonoscopy, flexible; with removal of                         tumor(s), polyp(s), or other lesion(s) by snare                         technique                        [id number], Colonoscopy, flexible; with directed submucosal                         injection(s), any substance Diagnosis Code(s):     --- Professional ---                        K63.5, Polyp of colon                        Z86.010,  Personal history of colonic polyps                        K62.4, Stenosis of anus and rectum CPT copyright 2019 American Medical Association. All rights reserved. The codes documented in this report are preliminary and upon coder review [place]  be revised to meet current compliance requirements. Robert Bellow, MD 09/18/2020 8:40:04 AM This report has been signed electronically. Number of Addenda: 0 Note Initiated On: 09/18/2020 7:01 AM Scope Withdrawal Time: 0 hours 41 minutes 1 second  Total Procedure Duration: 0 hours 44 minutes 29 seconds  Estimated Blood Loss:  Estimated blood loss: none.      [facility]

## 2020-09-18 NOTE — Op Note (Addendum)
[facility] Gastroenterology Patient Name: Jordan Gordon Procedure Date: 09/18/2020 7:08 AM MRN: [id number] Account #: [id number] Date of Birth: 02-13-1934 Admit Type: Outpatient Age: 87 Room: [facility] ENDO ROOM 1 Gender: Female Note Status: Supervisor Override Procedure:             Upper GI endoscopy Indications:           Melena Providers:             Robert Bellow, MD Referring MD:          Mar Daring (Referring MD) Medicines:             Propofol per Anesthesia Complications:         No immediate complications. Procedure:             Pre-Anesthesia Assessment:                        - Prior to the procedure, a History and Physical was                         performed, and patient medications, allergies and                         sensitivities were reviewed. The patient's tolerance                         of previous anesthesia was reviewed.                        - The risks and benefits of the procedure and the                         sedation options and risks were discussed with the                         patient. All questions were answered and informed                         consent was obtained.                        After obtaining informed consent, the endoscope was                         passed under direct vision. Throughout the procedure,                         the patient's blood pressure, pulse, and oxygen                         saturations were monitored continuously. The Endoscope                         was introduced through the mouth, and advanced to the                         second part of duodenum. The upper GI endoscopy was  accomplished without difficulty. The patient tolerated                         the procedure well. Findings:      A small hiatal hernia was present.      Diffuse moderate inflammation characterized by erosions and erythema was       found in the prepyloric region of the  stomach. Biopsies were taken with       a cold forceps for histology.      The examined duodenum was normal. Impression:            - Small hiatal hernia.                        - Chronic gastritis. Biopsied.                        - Normal examined duodenum. Recommendation:        - Telephone endoscopist for pathology results in 1                         week. Procedure Code(s):     --- Professional ---                        [id number], Esophagogastroduodenoscopy, flexible,                         transoral; with biopsy, single or multiple Diagnosis Code(s):     --- Professional ---                        K44.9, Diaphragmatic hernia without obstruction or                         gangrene                        K29.50, Unspecified chronic gastritis without bleeding CPT copyright 2019 American Medical Association. All rights reserved. The codes documented in this report are preliminary and upon coder review [place]  be revised to meet current compliance requirements. Robert Bellow, MD 09/18/2020 7:45:36 AM This report has been signed electronically. Number of Addenda: 0 Note Initiated On: 09/18/2020 7:08 AM Estimated Blood Loss:  Estimated blood loss was minimal.      [facility]

## 2020-09-18 NOTE — Anesthesia Postprocedure Evaluation (Signed)
Anesthesia Post Note  Patient: Jordan Gordon  Procedure(s) Performed: COLONOSCOPY WITH PROPOFOL (N/A ) ESOPHAGOGASTRODUODENOSCOPY (EGD) WITH PROPOFOL (N/A )  Patient location during evaluation: Endoscopy Anesthesia Type: General Level of consciousness: awake and alert and oriented Pain management: pain level controlled Vital Signs Assessment: post-procedure vital signs reviewed and stable Respiratory status: spontaneous breathing, nonlabored ventilation and respiratory function stable Cardiovascular status: blood pressure returned to baseline and stable Postop Assessment: no signs of nausea or vomiting Anesthetic complications: no   No complications documented.   Last Vitals:  Vitals:   09/18/20 0836 09/18/20 0906  BP:  (!) 189/80  Pulse:    Resp:    Temp: (!) 35.9 C   SpO2:      Last Pain:  Vitals:   09/18/20 0856  TempSrc:   PainSc: 0-No pain                 [name]

## 2020-09-18 NOTE — Transfer of Care (Signed)
Immediate Anesthesia Transfer of Care Note  Patient: Jordan Gordon  Procedure(s) Performed: COLONOSCOPY WITH PROPOFOL (N/A ) ESOPHAGOGASTRODUODENOSCOPY (EGD) WITH PROPOFOL (N/A )  Patient Location: Endoscopy Unit  Anesthesia Type:General  Level of Consciousness: drowsy  Airway & Oxygen Therapy: Patient Spontanous Breathing  Post-op Assessment: Report given to RN and Post -op Vital signs reviewed and stable  Post vital signs: Reviewed and stable  Last Vitals:  Vitals Value Taken Time  BP 119/66 09/18/20 0836  Temp 35.9 C 09/18/20 0836  Pulse 64 09/18/20 0836  Resp 9 09/18/20 0836  SpO2 97 % 09/18/20 0836  Vitals shown include unvalidated device data.  Last Pain:  Vitals:   09/18/20 0836  TempSrc: Temporal  PainSc: Asleep         Complications: No complications documented.

## 2020-09-18 NOTE — H&P (Signed)
Jordan Gordon [id number] [birth year]     HPI:  87 y/o s/p multiple polyp removal in [place] 2018 for repeat exam. Recent increase in reflux symptoms. For colonoscopy and EGD.   Tolerated prep well.  Accompanied by her son, [name].  Medications Prior to Admission  Medication Sig Dispense Refill Last Dose  . ALPRAZolam (XANAX) 0.25 MG tablet TAKE ONE TABLET 3 TIMES DAILY AS NEEDED FOR ANXIETY 90 tablet 5 09/18/2020 at 0600  . brimonidine (ALPHAGAN) 0.2 % ophthalmic solution 1 drop 3 (three) times daily.   09/17/2020 at Unknown time  . cephALEXin (KEFLEX) 500 MG capsule Take 1 capsule (500 mg total) by mouth 2 (two) times daily. 10 capsule 0 09/17/2020 at Unknown time  . dorzolamide-timolol (COSOPT) 22.3-6.8 MG/ML ophthalmic solution 1 drop 2 (two) times daily.   09/17/2020 at Unknown time  . latanoprost (XALATAN) 0.005 % ophthalmic solution Place 1 drop into the right eye at bedtime.   09/17/2020 at Unknown time  . losartan (COZAAR) 100 MG tablet Take 1 tablet (100 mg total) by mouth daily. 90 tablet 3 09/17/2020 at Unknown time  . mupirocin cream (BACTROBAN) 2 % Apply 1 application topically 2 (two) times daily as needed. 30 g 0 09/17/2020 at Unknown time  . potassium chloride (KLOR-CON) 10 MEQ tablet TAKE 1.5 TABLETS TWICE DAILY 270 tablet 3 09/17/2020 at Unknown time  . sertraline (ZOLOFT) 100 MG tablet Take 1.5 tablets (150 mg total) by mouth daily. 90 tablet 3 09/17/2020 at Unknown time  . traZODone (DESYREL) 100 MG tablet TAKE ONE TABLET BY MOUTH AT BEDTIME 30 tablet 0 09/17/2020 at Unknown time  . triamterene-hydrochlorothiazide (MAXZIDE-25) 37.5-25 MG tablet TAKE ONE TABLET BY MOUTH EVERY DAY 90 tablet 0 09/17/2020 at Unknown time   Allergies  Allergen Reactions  . Codeine     nausea   Past Medical History:  Diagnosis Date  . Anxiety   . Coronary artery disease   . GERD (gastroesophageal reflux disease)   . HOH (hard of hearing)   . Hyperlipidemia   . Hypertension   . Major depressive disorder     Past Surgical History:  Procedure Laterality Date  . ABDOMINAL HYSTERECTOMY  1979  . BREAST EXCISIONAL BIOPSY Left 2005   benign  . CATARACT EXTRACTION W/PHACO Left 02/08/2018   Procedure: CATARACT EXTRACTION PHACO AND INTRAOCULAR LENS PLACEMENT (IOC);  Surgeon: Birder Robson, MD;  Location: [facility] ORS;  Service: Ophthalmology;  Laterality: Left;  [place] 00:33.4 AP% 13.9 CDE 4.66 Fluid Pack lot # [id number]  . COLONOSCOPY WITH PROPOFOL N/A 07/22/2015   Procedure: COLONOSCOPY WITH PROPOFOL;  Surgeon: Manya Silvas, MD;  Location: [facility] ENDOSCOPY;  Service: Endoscopy;  Laterality: N/A;  . COLONOSCOPY WITH PROPOFOL N/A 10/19/2016   Procedure: COLONOSCOPY WITH PROPOFOL;  Surgeon: Manya Silvas, MD;  Location: [facility] ENDOSCOPY;  Service: Endoscopy;  Laterality: N/A;  . JOINT REPLACEMENT     TKR  . KNEE SURGERY Left 2011   2007  . TONSILLECTOMY  1940   ADENOIDS   Social History   Socioeconomic History  . Marital status: Widowed    Spouse name: Not on file  . Number of children: 2  . Years of education: Not on file  . Highest education level: High school graduate  Occupational History  . Occupation: retired  Tobacco Use  . Smoking status: Never Smoker  . Smokeless tobacco: Never Used  Vaping Use  . Vaping Use: Never used  Substance and Sexual Activity  . Alcohol use: Yes  Alcohol/week: 0.0 standard drinks    Comment: wine once every 2 weeks  . Drug use: No  . Sexual activity: Not on file  Other Topics Concern  . Not on file  Social History Narrative  . Not on file   Social Determinants of Health   Financial Resource Strain: Low Risk   . Difficulty of Paying Living Expenses: Not hard at all  Food Insecurity: No Food Insecurity  . Worried About Charity fundraiser in the Last Year: Never true  . Ran Out of Food in the Last Year: Never true  Transportation Needs: No Transportation Needs  . Lack of Transportation (Medical): No  . Lack of Transportation (Non-Medical): No   Physical Activity: Inactive  . Days of Exercise per Week: 0 days  . Minutes of Exercise per Session: 0 min  Stress: No Stress Concern Present  . Feeling of Stress : Only a little  Social Connections: Moderately Integrated  . Frequency of Communication with Friends and Family: More than three times a week  . Frequency of Social Gatherings with Friends and Family: More than three times a week  . Attends Religious Services: More than 4 times per year  . Active Member of Clubs or Organizations: No  . Attends Archivist Meetings: Never  . Marital Status: Married  Human resources officer Violence: Not At Risk  . Fear of Current or Ex-Partner: No  . Emotionally Abused: No  . Physically Abused: No  . Sexually Abused: No   Social History   Social History Narrative  . Not on file     ROS: Negative.   Laboratory review:  A. COLON POLYP, TRANSVERSE; HOT SNARE:  - TUBULAR ADENOMA.  - NEGATIVE FOR HIGH-GRADE DYSPLASIA AND MALIGNANCY.   B. COLON POLYP, PROXIMAL ASCENDING; HOT SNARE:  - TUBULAR ADENOMA.  - NEGATIVE FOR HIGH-GRADE DYSPLASIA AND MALIGNANCY.   C. COLON POLYP, CECUM; HOT SNARE:  - TUBULAR ADENOMA.  - NEGATIVE FOR HIGH-GRADE DYSPLASIA AND MALIGNANCY.   D. COLON POLYP, ASCENDING; COLD BIOPSY:  - TUBULAR ADENOMA.  - NEGATIVE FOR HIGH-GRADE DYSPLASIA AND MALIGNANCY.   E. COLON POLYP, ASCENDING; HOT SNARE:  - TUBULAR ADENOMA.  - NEGATIVE FOR HIGH-GRADE DYSPLASIA AND MALIGNANCY.   F. COLON POLYP, HEPATIC FLEXURE; HOT SNARE:  - TUBULAR ADENOMA.  - NEGATIVE FOR HIGH-GRADE DYSPLASIA AND MALIGNANCY.   PE: HEENT: Negative. Lungs: Clear. Cardio: RR.   Assessment/Plan:  Proceed with planned endoscopy.   [name] [name] 09/18/2020   Assessment/Plan:  Proceed with planned endoscopy.

## 2020-09-18 NOTE — Anesthesia Preprocedure Evaluation (Signed)
Anesthesia Evaluation  Patient identified by MRN, date of birth, ID band Patient awake    Reviewed: Allergy & Precautions, NPO status , Patient's Chart, lab work & pertinent test results  History of Anesthesia Complications Negative for: history of anesthetic complications  Airway Mallampati: II  TM Distance: >3 FB Neck ROM: Full    Dental  (+) Upper Dentures   Pulmonary neg pulmonary ROS, neg sleep apnea, neg COPD,    breath sounds clear to auscultation- rhonchi (-) wheezing      Cardiovascular hypertension, Pt. on medications (-) CAD, (-) Past MI, (-) Cardiac Stents and (-) CABG  Rhythm:Regular Rate:Normal - Systolic murmurs and - Diastolic murmurs    Neuro/Psych neg Seizures PSYCHIATRIC DISORDERS Anxiety Depression negative neurological ROS     GI/Hepatic Neg liver ROS, GERD  ,  Endo/Other  negative endocrine ROSneg diabetes  Renal/GU Renal InsufficiencyRenal disease     Musculoskeletal  (+) Arthritis ,   Abdominal (+) - obese,   Peds  Hematology negative hematology ROS (+)   Anesthesia Other Findings Past Medical History: No date: Anxiety No date: Coronary artery disease No date: GERD (gastroesophageal reflux disease) No date: HOH (hard of hearing) No date: Hyperlipidemia No date: Hypertension No date: Major depressive disorder   Reproductive/Obstetrics                             Anesthesia Physical Anesthesia Plan  ASA: III  Anesthesia Plan: General   Post-op Pain Management:    Induction: Intravenous  PONV Risk Score and Plan: 2 and Propofol infusion  Airway Management Planned: Natural Airway  Additional Equipment:   Intra-op Plan:   Post-operative Plan:   Informed Consent: I have reviewed the patients History and Physical, chart, labs and discussed the procedure including the risks, benefits and alternatives for the proposed anesthesia with the patient or  authorized representative who has indicated his/her understanding and acceptance.     Dental advisory given  Plan Discussed with: CRNA and Anesthesiologist  Anesthesia Plan Comments:         Anesthesia Quick Evaluation

## 2020-09-19 ENCOUNTER — Encounter: Payer: Self-pay | Admitting: General Surgery

## 2020-09-19 LAB — SURGICAL PATHOLOGY

## 2020-10-14 DIAGNOSIS — H353212 Exudative age-related macular degeneration, right eye, with inactive choroidal neovascularization: Secondary | ICD-10-CM | POA: Diagnosis not present

## 2020-10-16 ENCOUNTER — Encounter: Payer: Self-pay | Admitting: Podiatry

## 2020-10-16 ENCOUNTER — Other Ambulatory Visit: Payer: Self-pay

## 2020-10-16 ENCOUNTER — Ambulatory Visit: Payer: PPO | Admitting: Podiatry

## 2020-10-16 DIAGNOSIS — M79676 Pain in unspecified toe(s): Secondary | ICD-10-CM

## 2020-10-16 DIAGNOSIS — B351 Tinea unguium: Secondary | ICD-10-CM | POA: Diagnosis not present

## 2020-10-16 NOTE — Progress Notes (Signed)
Subjective:  Patient ID: Jordan Gordon, female    DOB: August 05, 1933,  MRN: [id number] HPI Chief Complaint  Patient presents with  . Debridement    Patient requesting nail care - says both big toenails are thick and curved, but unable to cut herself  . New Patient (Initial Visit)    87 y.o. female presents with the above complaint.   ROS: Denies fever chills nausea vomiting muscle aches pains calf pain back pain chest pain shortness of breath.  Past Medical History:  Diagnosis Date  . Anxiety   . Coronary artery disease   . GERD (gastroesophageal reflux disease)   . HOH (hard of hearing)   . Hyperlipidemia   . Hypertension   . Major depressive disorder    Past Surgical History:  Procedure Laterality Date  . ABDOMINAL HYSTERECTOMY  1979  . BREAST EXCISIONAL BIOPSY Left 2005   benign  . CATARACT EXTRACTION W/PHACO Left 02/08/2018   Procedure: CATARACT EXTRACTION PHACO AND INTRAOCULAR LENS PLACEMENT (IOC);  Surgeon: Birder Robson, MD;  Location: [facility] ORS;  Service: Ophthalmology;  Laterality: Left;  [place] 00:33.4 AP% 13.9 CDE 4.66 Fluid Pack lot # [id number]  . COLONOSCOPY WITH PROPOFOL N/A 07/22/2015   Procedure: COLONOSCOPY WITH PROPOFOL;  Surgeon: Manya Silvas, MD;  Location[facility] ENDOSCOPY;  Service: Endoscopy;  Laterality: N/A;  . COLONOSCOPY WITH PROPOFOL N/A 10/19/2016   Procedure: COLONOSCOPY WITH PROPOFOL;  Surgeon: Manya Silvas, MD;  Location: [facility] ENDOSCOPY;  Service: Endoscopy;  Laterality: N/A;  . COLONOSCOPY WITH PROPOFOL N/A 09/18/2020   Procedure: COLONOSCOPY WITH PROPOFOL;  Surgeon: Robert Bellow, MD;  Location: [facility] ENDOSCOPY;  Service: Endoscopy;  Laterality: N/A;  . ESOPHAGOGASTRODUODENOSCOPY (EGD) WITH PROPOFOL N/A 09/18/2020   Procedure: ESOPHAGOGASTRODUODENOSCOPY (EGD) WITH PROPOFOL;  Surgeon: Robert Bellow, MD;  Location: [facility] ENDOSCOPY;  Service: Endoscopy;  Laterality: N/A;  . JOINT REPLACEMENT     TKR  . KNEE SURGERY Left 2011   2007  .  TONSILLECTOMY  1940   ADENOIDS    Current Outpatient Medications:  .  ALPRAZolam (XANAX) 0.25 MG tablet, TAKE ONE TABLET 3 TIMES DAILY AS NEEDED FOR ANXIETY, Disp: 90 tablet, Rfl: 5 .  brimonidine (ALPHAGAN) 0.2 % ophthalmic solution, 1 drop 3 (three) times daily., Disp: , Rfl:  .  dorzolamide-timolol (COSOPT) 22.3-6.8 MG/ML ophthalmic solution, 1 drop 2 (two) times daily., Disp: , Rfl:  .  losartan (COZAAR) 100 MG tablet, Take 1 tablet (100 mg total) by mouth daily., Disp: 90 tablet, Rfl: 3 .  mupirocin cream (BACTROBAN) 2 %, Apply 1 application topically 2 (two) times daily as needed., Disp: 30 g, Rfl: 0 .  ondansetron (ZOFRAN-ODT) 4 MG disintegrating tablet, 4 mg every 8 (eight) hours as needed., Disp: , Rfl:  .  pantoprazole (PROTONIX) 40 MG tablet, Take 1 tablet by mouth 2 (two) times daily., Disp: , Rfl:  .  potassium chloride (KLOR-CON) 10 MEQ tablet, TAKE 1.5 TABLETS TWICE DAILY, Disp: 270 tablet, Rfl: 3 .  ROCKLATAN 0.02-0.005 % SOLN, Apply to eye., Disp: , Rfl:  .  sertraline (ZOLOFT) 100 MG tablet, Take 1.5 tablets (150 mg total) by mouth daily., Disp: 90 tablet, Rfl: 3 .  traZODone (DESYREL) 100 MG tablet, TAKE ONE TABLET BY MOUTH AT BEDTIME, Disp: 30 tablet, Rfl: 0 .  triamterene-hydrochlorothiazide (MAXZIDE-25) 37.5-25 MG tablet, TAKE ONE TABLET BY MOUTH EVERY DAY, Disp: 90 tablet, Rfl: 0  Allergies  Allergen Reactions  . Codeine     nausea   Review of Systems  Objective:  There were no vitals filed for this visit.  General: Well developed, nourished, in no acute distress, alert and oriented x3   Dermatological: Skin is warm, dry and supple bilateral. Nails x 10 are long thick yellow dystrophic clinically mycotic.  Remaining integument appears unremarkable at this time. There are no open sores, no preulcerative lesions, no rash or signs of infection present.  Vascular: Dorsalis Pedis artery and Posterior Tibial artery pedal pulses are 2/4 bilateral with immedate capillary  fill time. Pedal hair growth present. No varicosities and no lower extremity edema present bilateral.   Neruologic: Grossly intact via light touch bilateral. Vibratory intact via tuning fork bilateral. Protective threshold with Semmes Wienstein monofilament intact to all pedal sites bilateral. Patellar and Achilles deep tendon reflexes 2+ bilateral. No Babinski or clonus noted bilateral.   Musculoskeletal: No gross boney pedal deformities bilateral. No pain, crepitus, or limitation noted with foot and ankle range of motion bilateral. Muscular strength 5/5 in all groups tested bilateral.  Gait: Unassisted, Nonantalgic.    Radiographs:  None taken  Assessment & Plan:   Assessment: Painful elongated toenails pain in limb secondary to onychomycosis.    Plan: Debridement of toenails 1 through 5 bilateral.     Jordan Gordon T. [place], [place]

## 2020-10-17 ENCOUNTER — Other Ambulatory Visit: Payer: Self-pay | Admitting: Family Medicine

## 2020-10-17 DIAGNOSIS — G4700 Insomnia, unspecified: Secondary | ICD-10-CM

## 2020-10-23 ENCOUNTER — Encounter: Payer: Self-pay | Admitting: Family Medicine

## 2020-10-24 ENCOUNTER — Other Ambulatory Visit: Payer: Self-pay

## 2020-10-24 ENCOUNTER — Emergency Department: Payer: PPO

## 2020-10-24 ENCOUNTER — Emergency Department
Admission: EM | Admit: 2020-10-24 | Discharge: 2020-10-24 | Disposition: A | Discharge status: Home / Self Care | Payer: PPO | Attending: Emergency Medicine | Admitting: Emergency Medicine

## 2020-10-24 DIAGNOSIS — I10 Essential (primary) hypertension: Secondary | ICD-10-CM | POA: Insufficient documentation

## 2020-10-24 DIAGNOSIS — R519 Headache, unspecified: Secondary | ICD-10-CM | POA: Insufficient documentation

## 2020-10-24 DIAGNOSIS — R1084 Generalized abdominal pain: Secondary | ICD-10-CM | POA: Diagnosis not present

## 2020-10-24 DIAGNOSIS — I2510 Atherosclerotic heart disease of native coronary artery without angina pectoris: Secondary | ICD-10-CM | POA: Diagnosis not present

## 2020-10-24 DIAGNOSIS — Z79899 Other long term (current) drug therapy: Secondary | ICD-10-CM | POA: Diagnosis not present

## 2020-10-24 DIAGNOSIS — R079 Chest pain, unspecified: Secondary | ICD-10-CM | POA: Insufficient documentation

## 2020-10-24 LAB — BASIC METABOLIC PANEL
Anion gap: 9 (ref 5–15)
BUN: 16 mg/dL (ref 8–23)
CO2: 25 mmol/L (ref 22–32)
Calcium: 8.6 mg/dL — ABNORMAL LOW (ref 8.9–10.3)
Chloride: 109 mmol/L (ref 98–111)
Creatinine, Ser: 0.77 mg/dL (ref 0.44–1.00)
GFR, Estimated: >60 mL/min (ref >60)
Glucose, Bld: 114 mg/dL — ABNORMAL HIGH (ref 70–99)
Potassium: 3.1 mmol/L — ABNORMAL LOW (ref 3.5–5.1)
Sodium: 143 mmol/L (ref 135–145)

## 2020-10-24 LAB — CBC
HCT: 39 % (ref 36.0–46.0)
Hemoglobin: 13.9 g/dL (ref 12.0–15.0)
MCH: 30.9 pg (ref 26.0–34.0)
MCHC: 35.6 g/dL (ref 30.0–36.0)
MCV: 86.7 fL (ref 80.0–100.0)
Platelets: 168 10*3/uL (ref 150–400)
RBC: 4.5 MIL/uL (ref 3.87–5.11)
RDW: 13.2 % (ref 11.5–15.5)
WBC: 5.4 10*3/uL (ref 4.0–10.5)
nRBC: 0 % (ref 0.0–0.2)

## 2020-10-24 LAB — URINALYSIS, COMPLETE (UACMP) WITH MICROSCOPIC
Bacteria, UA: NONE SEEN
Bilirubin Urine: NEGATIVE
Glucose, UA: NEGATIVE mg/dL
Hgb urine dipstick: NEGATIVE
Ketones, ur: NEGATIVE mg/dL
Leukocytes,Ua: NEGATIVE
Nitrite: NEGATIVE
Protein, ur: NEGATIVE mg/dL
Specific Gravity, Urine: 1.009 (ref 1.005–1.030)
pH: 7 (ref 5.0–8.0)

## 2020-10-24 LAB — TROPONIN I (HIGH SENSITIVITY)
Troponin I (High Sensitivity): 10 ng/L (ref <18)
Troponin I (High Sensitivity): 12 ng/L (ref <18)

## 2020-10-24 LAB — LIPASE, BLOOD: Lipase: 29 U/L (ref 11–51)

## 2020-10-24 MED ORDER — LABETALOL HCL 5 MG/ML IV SOLN
20.0000 mg | Freq: Once | INTRAVENOUS | Status: AC
  Administered 2020-10-24: 20 mg via INTRAVENOUS
  Filled 2020-10-24: qty 4

## 2020-10-24 NOTE — ED Notes (Signed)
Pt verbalizes d/c instructions. No further questions at this time.

## 2020-10-24 NOTE — ED Provider Notes (Signed)
[facility] Emergency Department Provider Note   ____________________________________________   Event Date/Time   First MD Initiated Contact with Patient 10/24/20 0901     (approximate)  I have reviewed the triage vital signs and the nursing notes.   HISTORY  Chief Complaint Abdominal Pain and Chest Pain    HPI Jordan Gordon is a 87 y.o. female with below stated past medical history the presents for multiple complaints including abdominal pain, chest pain, headache, and hypertension.  Patient's son at bedside as patient has history of dementia and cannot provide reliable history.  States the patient has issues taking her blood pressure medications and has had similar symptoms in the past when she has had hypertension.  Further history and review of systems are unable to obtain at this time given patient's mental status         Past Medical History:  Diagnosis Date  . Anxiety   . Coronary artery disease   . GERD (gastroesophageal reflux disease)   . HOH (hard of hearing)   . Hyperlipidemia   . Hypertension   . Major depressive disorder     Patient Active Problem List   Diagnosis Date Noted  . Major depressive disorder, recurrent episode, moderate ([place]) 01/13/2019  . Mechanical and motor problems with internal organs 04/18/2015  . Abnormal foot pulse 04/18/2015  . Clinical depression 04/18/2015  . Fatigue 04/18/2015  . Blood glucose elevated 04/18/2015  . Decreased potassium in the blood 04/18/2015  . Low back pain 04/18/2015  . Paresthesia of skin 04/18/2015  . Gastro-esophageal reflux disease with esophagitis 04/18/2015  . Anxiety 01/07/2015  . Chest pain 09/20/2014  . History of cardiac catheterization 03/08/2014  . Disorder resulting from impaired renal function 07/29/2009  . Muscle ache 03/15/2009  . SOB (shortness of breath) on exertion 09/26/2008  . Adaptation reaction 07/21/2006  . Osteoarthritis 07/21/2006  . Benign neoplasm of  large bowel 03/08/2000  . Essential (primary) hypertension 04/16/1998  . Allergic rhinitis 03/12/1998  . Insomnia 03/04/1998  . Menopausal and perimenopausal disorder 02/02/1997  . HLD (hyperlipidemia) 12/06/1991    Past Surgical History:  Procedure Laterality Date  . ABDOMINAL HYSTERECTOMY  1979  . BREAST EXCISIONAL BIOPSY Left 2005   benign  . CATARACT EXTRACTION W/PHACO Left 02/08/2018   Procedure: CATARACT EXTRACTION PHACO AND INTRAOCULAR LENS PLACEMENT (IOC);  Surgeon: Birder Robson, MD;  Location: [facility] ORS;  Service: Ophthalmology;  Laterality: Left;  [place] 00:33.4 AP% 13.9 CDE 4.66 Fluid Pack lot # [id number]  . COLONOSCOPY WITH PROPOFOL N/A 07/22/2015   Procedure: COLONOSCOPY WITH PROPOFOL;  Surgeon: Manya Silvas, MD;  Location: [facility] ENDOSCOPY;  Service: Endoscopy;  Laterality: N/A;  . COLONOSCOPY WITH PROPOFOL N/A 10/19/2016   Procedure: COLONOSCOPY WITH PROPOFOL;  Surgeon: Manya Silvas, MD;  Location: [facility] ENDOSCOPY;  Service: Endoscopy;  Laterality: N/A;  . COLONOSCOPY WITH PROPOFOL N/A 09/18/2020   Procedure: COLONOSCOPY WITH PROPOFOL;  Surgeon: Robert Bellow, MD;  Location: [facility] ENDOSCOPY;  Service: Endoscopy;  Laterality: N/A;  . ESOPHAGOGASTRODUODENOSCOPY (EGD) WITH PROPOFOL N/A 09/18/2020   Procedure: ESOPHAGOGASTRODUODENOSCOPY (EGD) WITH PROPOFOL;  Surgeon: Robert Bellow, MD;  Location: [facility] ENDOSCOPY;  Service: Endoscopy;  Laterality: N/A;  . JOINT REPLACEMENT     TKR  . KNEE SURGERY Left 2011   2007  . TONSILLECTOMY  1940   ADENOIDS    Prior to Admission medications   Medication Sig Start Date End Date Taking? Authorizing Provider  ALPRAZolam Duanne Moron) 0.25 MG tablet TAKE  ONE TABLET 3 TIMES DAILY AS NEEDED FOR ANXIETY 09/02/20   Mar Daring, PA-C  brimonidine [facility]) 0.2 % ophthalmic solution 1 drop 3 (three) times daily. 12/15/19   [provider]  dorzolamide-timolol (COSOPT) 22.3-6.8 MG/ML ophthalmic solution 1 drop 2 (two) times  daily. 12/15/19   [provider]  losartan (COZAAR) 100 MG tablet Take 1 tablet (100 mg total) by mouth daily. 04/17/20   Mar Daring, PA-C  mupirocin cream (BACTROBAN) 2 % Apply 1 application topically 2 (two) times daily as needed. 04/17/20   Mar Daring, PA-C  ondansetron (ZOFRAN-ODT) 4 MG disintegrating tablet 4 mg every 8 (eight) hours as needed. 09/02/20   [provider]  pantoprazole (PROTONIX) 40 MG tablet Take 1 tablet by mouth 2 (two) times daily. 09/02/20   [provider]  potassium chloride (KLOR-CON) 10 MEQ tablet TAKE 1.5 TABLETS TWICE DAILY 04/08/20   Mar Daring, PA-C  ROCKLATAN 0.02-0.005 % SOLN Apply to eye. 10/15/20   [provider]  sertraline (ZOLOFT) 100 MG tablet Take 1.5 tablets (150 mg total) by mouth daily. 07/17/20   Mar Daring, PA-C  traZODone (DESYREL) 100 MG tablet TAKE ONE TABLET BY MOUTH AT BEDTIME 10/17/20   Virginia Crews, MD  triamterene-hydrochlorothiazide (XQJJHER-74) 37.5-25 MG tablet TAKE ONE TABLET BY MOUTH EVERY DAY 06/06/20   Mar Daring, PA-C    Allergies Codeine  Family History  Problem Relation Age of Onset  . Cancer Mother   . Heart disease Father   . Cancer Brother   . Breast cancer Neg Hx     Social History Social History   Tobacco Use  . Smoking status: Never Smoker  . Smokeless tobacco: Never Used  Vaping Use  . Vaping Use: Never used  Substance Use Topics  . Alcohol use: Yes    Alcohol/week: 0.0 standard drinks    Comment: wine once every 2 weeks  . Drug use: No    Review of Systems Unable to assess ____________________________________________   PHYSICAL EXAM:  VITAL SIGNS: ED Triage Vitals  Enc Vitals Group     BP 10/24/20 0800 (!) 234/110     Pulse Rate 10/24/20 0800 90     Resp 10/24/20 0806 18     Temp 10/24/20 0800 98 F (36.7 C)     Temp Source 10/24/20 0800 Oral     SpO2 10/24/20 0800 95 %     Weight 10/24/20 0809 155 lb  (70.3 kg)     Height --      Head Circumference --      Peak Flow --      Pain Score 10/24/20 1159 0     Pain Loc --      Pain Edu? --      Excl. in [place]? --    Constitutional: Alert and disoriented. Well appearing and in no acute distress. Eyes: Conjunctivae are normal. PERRL. Head: Atraumatic. Nose: No congestion/rhinnorhea. Mouth/Throat: Mucous membranes are moist. Neck: No stridor Cardiovascular: Grossly normal heart sounds.  Good peripheral circulation. Respiratory: Normal respiratory effort.  No retractions. Gastrointestinal: Soft and nontender. No distention. Musculoskeletal: No obvious deformities Neurologic:  Normal speech and language. No gross focal neurologic deficits are appreciated. Skin:  Skin is warm and dry. No rash noted. Psychiatric: Mood and affect are normal. Speech and behavior are normal.  ____________________________________________   LABS (all labs ordered are listed, but only abnormal results are displayed)  Labs Reviewed  BASIC METABOLIC PANEL - Abnormal;  Notable for the following components:      Result Value   Potassium 3.1 (*)    Glucose, Bld 114 (*)    Calcium 8.6 (*)    All other components within normal limits  URINALYSIS, COMPLETE (UACMP) WITH MICROSCOPIC - Abnormal; Notable for the following components:   Color, Urine STRAW (*)    APPearance CLEAR (*)    All other components within normal limits  CBC  LIPASE, BLOOD  TROPONIN I (HIGH SENSITIVITY)  TROPONIN I (HIGH SENSITIVITY)   ____________________________________________  EKG  ED ECG REPORT I, Naaman Plummer, the attending physician, personally viewed and interpreted this ECG.  Date: 10/24/2020 EKG Time: 0812 Rate: 95 Rhythm: normal sinus rhythm QRS Axis: normal Intervals: normal ST/T Wave abnormalities: normal Narrative Interpretation: no evidence of acute ischemia  ____________________________________________  RADIOLOGY  ED MD interpretation: 2 view chest x-ray shows  no evidence of acute abnormalities including no pneumonia, pneumothorax, or widened mediastinum  Official radiology report(s): DG Chest 2 View  Result Date: 10/24/2020 CLINICAL DATA:  Chest pain EXAM: CHEST - 2 VIEW COMPARISON:  January 13, 2019 FINDINGS: There is mild scarring in the lung bases. No edema or airspace opacity. Heart is borderline enlarged with pulmonary vascularity normal. No adenopathy. There is aortic atherosclerosis. There is degenerative change in the thoracic spine. IMPRESSION: Bibasilar scarring. No edema or airspace opacity. Heart borderline enlarged. Aortic Atherosclerosis (ICD10-I70.0). Electronically Signed   By: Lowella Grip III M.D.   On: 10/24/2020 08:57    ____________________________________________   PROCEDURES  Procedure(s) performed (including Critical Care):  .1-3 Lead EKG Interpretation Performed by: Naaman Plummer, MD Authorized by: Naaman Plummer, MD     Interpretation: normal     ECG rate:  77   ECG rate assessment: normal     Rhythm: sinus rhythm     Ectopy: none     Conduction: normal       ____________________________________________   INITIAL IMPRESSION / ASSESSMENT AND PLAN / ED COURSE  As part of my medical decision making, I reviewed the following data within the [facility] notes reviewed and incorporated, Labs reviewed, EKG interpreted, Old chart reviewed, Radiograph reviewed and Notes from prior ED visits reviewed and incorporated      Presents to the emergency department complaining of high blood pressure. Patient is otherwise asymptomatic without confusion, chest pain, hematuria, or SOB. + Nonadherence to antihypertensive regimen DDx: CV, AMI, heart failure, renal infarction or failure or other end organ damage.  Disposition: Discussed with patient their elevated blood pressure and need for close outpatient management of their hypertension. Will provide a prescription for the patients previous  antihypertensive medication and arrange for the patient to follow up in a primary care clinic     ____________________________________________   FINAL CLINICAL IMPRESSION(S) / ED DIAGNOSES  Final diagnoses:  Primary hypertension  Generalized abdominal pain  Chest pain, unspecified type     ED Discharge Orders    None       Note:  This document was prepared using Dragon voice recognition software and [place] include unintentional dictation errors.   Naaman Plummer, MD 10/24/20 [phone]

## 2020-10-24 NOTE — ED Notes (Signed)
Pt to ED with son, c/o "I just feel bad". Says has upper abdominal pain and back pain. Ambulatory with steady gait. Pt states that

## 2020-10-24 NOTE — ED Triage Notes (Signed)
First Nurse Note:  Arrives with son c/o 'being sick all over'.  Awake and alert.  NAD

## 2020-10-24 NOTE — ED Triage Notes (Addendum)
Pt arrives via pov with son. Son reports pt complains of being "sick all over". Pt reports pain in upper abd and intermittent chest pains. When asking " I just feel bad, I just feel horrible" for several weeks. Son reports frequent doctor visits and recently screened for colonoscopy and endoscopy. Son reports seeing a cardiologist within the last year. Pt seems anxious and frustrated when attempting to provide information in triage. BP elevated, pt reports taking prescribed BP medication around 0530 this morning. bp checked twice with similar readings. NAD noted at this time, Respiratory rate even and unlabored.   When attempting to get pt to rate pain in abd patient then states she is not having any pain in stomach. Son reports hx of dementia

## 2020-10-28 ENCOUNTER — Other Ambulatory Visit: Payer: Self-pay

## 2020-10-28 ENCOUNTER — Observation Stay
Admission: EM | Admit: 2020-10-28 | Discharge: 2020-10-29 | Disposition: A | Discharge status: Home / Self Care | Payer: PPO | Attending: Internal Medicine | Admitting: Internal Medicine

## 2020-10-28 ENCOUNTER — Inpatient Hospital Stay: Payer: PPO

## 2020-10-28 ENCOUNTER — Emergency Department: Payer: PPO

## 2020-10-28 DIAGNOSIS — K5750 Diverticulosis of both small and large intestine without perforation or abscess without bleeding: Secondary | ICD-10-CM | POA: Diagnosis not present

## 2020-10-28 DIAGNOSIS — E876 Hypokalemia: Secondary | ICD-10-CM | POA: Diagnosis not present

## 2020-10-28 DIAGNOSIS — R109 Unspecified abdominal pain: Secondary | ICD-10-CM | POA: Diagnosis not present

## 2020-10-28 DIAGNOSIS — K219 Gastro-esophageal reflux disease without esophagitis: Secondary | ICD-10-CM | POA: Diagnosis not present

## 2020-10-28 DIAGNOSIS — R1111 Vomiting without nausea: Secondary | ICD-10-CM | POA: Diagnosis not present

## 2020-10-28 DIAGNOSIS — F418 Other specified anxiety disorders: Secondary | ICD-10-CM

## 2020-10-28 DIAGNOSIS — Z79899 Other long term (current) drug therapy: Secondary | ICD-10-CM | POA: Insufficient documentation

## 2020-10-28 DIAGNOSIS — R531 Weakness: Secondary | ICD-10-CM | POA: Diagnosis not present

## 2020-10-28 DIAGNOSIS — Z96651 Presence of right artificial knee joint: Secondary | ICD-10-CM | POA: Insufficient documentation

## 2020-10-28 DIAGNOSIS — R0602 Shortness of breath: Secondary | ICD-10-CM | POA: Diagnosis not present

## 2020-10-28 DIAGNOSIS — I10 Essential (primary) hypertension: Secondary | ICD-10-CM

## 2020-10-28 DIAGNOSIS — K449 Diaphragmatic hernia without obstruction or gangrene: Secondary | ICD-10-CM | POA: Diagnosis not present

## 2020-10-28 DIAGNOSIS — R079 Chest pain, unspecified: Secondary | ICD-10-CM

## 2020-10-28 DIAGNOSIS — J189 Pneumonia, unspecified organism: Principal | ICD-10-CM

## 2020-10-28 DIAGNOSIS — I2510 Atherosclerotic heart disease of native coronary artery without angina pectoris: Secondary | ICD-10-CM | POA: Diagnosis not present

## 2020-10-28 DIAGNOSIS — Z20822 Contact with and (suspected) exposure to covid-19: Secondary | ICD-10-CM | POA: Diagnosis not present

## 2020-10-28 DIAGNOSIS — F0390 Unspecified dementia without behavioral disturbance: Secondary | ICD-10-CM | POA: Diagnosis not present

## 2020-10-28 DIAGNOSIS — K8689 Other specified diseases of pancreas: Secondary | ICD-10-CM | POA: Diagnosis not present

## 2020-10-28 DIAGNOSIS — R110 Nausea: Secondary | ICD-10-CM | POA: Diagnosis not present

## 2020-10-28 DIAGNOSIS — E785 Hyperlipidemia, unspecified: Secondary | ICD-10-CM | POA: Diagnosis present

## 2020-10-28 DIAGNOSIS — I160 Hypertensive urgency: Secondary | ICD-10-CM | POA: Diagnosis not present

## 2020-10-28 DIAGNOSIS — J9601 Acute respiratory failure with hypoxia: Secondary | ICD-10-CM

## 2020-10-28 DIAGNOSIS — R0902 Hypoxemia: Secondary | ICD-10-CM | POA: Diagnosis not present

## 2020-10-28 DIAGNOSIS — J180 Bronchopneumonia, unspecified organism: Secondary | ICD-10-CM | POA: Diagnosis not present

## 2020-10-28 LAB — CBC WITH DIFFERENTIAL/PLATELET
Abs Immature Granulocytes: 0.02 10*3/uL (ref 0.00–0.07)
Basophils Absolute: 0 10*3/uL (ref 0.0–0.1)
Basophils Relative: 0 %
Eosinophils Absolute: 0.1 10*3/uL (ref 0.0–0.5)
Eosinophils Relative: 2 %
HCT: 37 % (ref 36.0–46.0)
Hemoglobin: 13.2 g/dL (ref 12.0–15.0)
Immature Granulocytes: 0 %
Lymphocytes Relative: 23 %
Lymphs Abs: 1.1 10*3/uL (ref 0.7–4.0)
MCH: 30.9 pg (ref 26.0–34.0)
MCHC: 35.7 g/dL (ref 30.0–36.0)
MCV: 86.7 fL (ref 80.0–100.0)
Monocytes Absolute: 0.4 10*3/uL (ref 0.1–1.0)
Monocytes Relative: 8 %
Neutro Abs: 3.4 10*3/uL (ref 1.7–7.7)
Neutrophils Relative %: 67 %
Platelets: 175 10*3/uL (ref 150–400)
RBC: 4.27 MIL/uL (ref 3.87–5.11)
RDW: 13.1 % (ref 11.5–15.5)
WBC: 5.1 10*3/uL (ref 4.0–10.5)
nRBC: 0 % (ref 0.0–0.2)

## 2020-10-28 LAB — COMPREHENSIVE METABOLIC PANEL
ALT: 17 U/L (ref 0–44)
AST: 20 U/L (ref 15–41)
Albumin: 3.9 g/dL (ref 3.5–5.0)
Alkaline Phosphatase: 64 U/L (ref 38–126)
Anion gap: 9 (ref 5–15)
BUN: 20 mg/dL (ref 8–23)
CO2: 28 mmol/L (ref 22–32)
Calcium: 8.9 mg/dL (ref 8.9–10.3)
Chloride: 105 mmol/L (ref 98–111)
Creatinine, Ser: 0.79 mg/dL (ref 0.44–1.00)
GFR, Estimated: >60 mL/min (ref >60)
Glucose, Bld: 115 mg/dL — ABNORMAL HIGH (ref 70–99)
Potassium: 3 mmol/L — ABNORMAL LOW (ref 3.5–5.1)
Sodium: 142 mmol/L (ref 135–145)
Total Bilirubin: 0.9 mg/dL (ref 0.3–1.2)
Total Protein: 6.8 g/dL (ref 6.5–8.1)

## 2020-10-28 LAB — LIPASE, BLOOD: Lipase: 31 U/L (ref 11–51)

## 2020-10-28 LAB — MAGNESIUM: Magnesium: 2.1 mg/dL (ref 1.7–2.4)

## 2020-10-28 LAB — RESP PANEL BY RT-PCR (FLU A&B, COVID) ARPGX2
Influenza A by PCR: NEGATIVE
Influenza B by PCR: NEGATIVE
SARS Coronavirus 2 by RT PCR: NEGATIVE

## 2020-10-28 LAB — TROPONIN I (HIGH SENSITIVITY)
Troponin I (High Sensitivity): 11 ng/L (ref <18)
Troponin I (High Sensitivity): 12 ng/L (ref <18)
Troponin I (High Sensitivity): 11 ng/L (ref <18)

## 2020-10-28 LAB — BRAIN NATRIURETIC PEPTIDE: B Natriuretic Peptide: 186.7 pg/mL — ABNORMAL HIGH (ref 0.0–100.0)

## 2020-10-28 LAB — PROCALCITONIN: Procalcitonin: <0.1 ng/mL

## 2020-10-28 MED ORDER — LOSARTAN POTASSIUM 50 MG PO TABS
50.0000 mg | ORAL_TABLET | Freq: Every day | ORAL | Status: DC
  Administered 2020-10-28: 50 mg via ORAL
  Filled 2020-10-28: qty 1

## 2020-10-28 MED ORDER — LABETALOL HCL 5 MG/ML IV SOLN
10.0000 mg | Freq: Once | INTRAVENOUS | Status: AC
  Administered 2020-10-28: 10 mg via INTRAVENOUS
  Filled 2020-10-28: qty 4

## 2020-10-28 MED ORDER — ALUM & MAG HYDROXIDE-SIMETH 200-200-20 MG/5ML PO SUSP
30.0000 mL | Freq: Once | ORAL | Status: AC
  Administered 2020-10-28: 30 mL via ORAL
  Filled 2020-10-28: qty 30

## 2020-10-28 MED ORDER — HYDRALAZINE HCL 20 MG/ML IJ SOLN
5.0000 mg | INTRAMUSCULAR | Status: DC | PRN
  Administered 2020-10-28: 5 mg via INTRAVENOUS
  Filled 2020-10-28: qty 1

## 2020-10-28 MED ORDER — SERTRALINE HCL 50 MG PO TABS: 150.0000 mg | ORAL_TABLET | Freq: Every day | ORAL | Status: DC

## 2020-10-28 MED ORDER — ASPIRIN EC 81 MG PO TBEC
81.0000 mg | DELAYED_RELEASE_TABLET | Freq: Every day | ORAL | Status: DC
  Administered 2020-10-29: 81 mg via ORAL
  Filled 2020-10-28: qty 1

## 2020-10-28 MED ORDER — DORZOLAMIDE HCL-TIMOLOL MAL 2-0.5 % OP SOLN
1.0000 [drp] | Freq: Two times a day (BID) | OPHTHALMIC | Status: DC
  Administered 2020-10-28 – 2020-10-29 (×2): 1 [drp] via OPHTHALMIC
  Filled 2020-10-28: qty 10

## 2020-10-28 MED ORDER — SODIUM CHLORIDE 0.9 % IV BOLUS
500.0000 mL | Freq: Once | INTRAVENOUS | Status: AC
  Administered 2020-10-28: 500 mL via INTRAVENOUS

## 2020-10-28 MED ORDER — SERTRALINE HCL 50 MG PO TABS
100.0000 mg | ORAL_TABLET | Freq: Once | ORAL | Status: AC
  Administered 2020-10-28: 100 mg via ORAL
  Filled 2020-10-28: qty 2

## 2020-10-28 MED ORDER — POTASSIUM CHLORIDE CRYS ER 20 MEQ PO TBCR
40.0000 meq | EXTENDED_RELEASE_TABLET | Freq: Once | ORAL | Status: AC
  Administered 2020-10-28: 40 meq via ORAL
  Filled 2020-10-28: qty 2

## 2020-10-28 MED ORDER — ONDANSETRON 4 MG PO TBDP
8.0000 mg | ORAL_TABLET | Freq: Once | ORAL | Status: AC
  Administered 2020-10-28: 8 mg via ORAL
  Filled 2020-10-28: qty 2

## 2020-10-28 MED ORDER — DM-GUAIFENESIN ER 30-600 MG PO TB12: 1.0000 | ORAL_TABLET | Freq: Two times a day (BID) | ORAL | Status: DC | PRN

## 2020-10-28 MED ORDER — TRIAMTERENE-HCTZ 37.5-25 MG PO TABS
1.0000 | ORAL_TABLET | Freq: Every day | ORAL | Status: DC
  Administered 2020-10-29: 1 via ORAL
  Filled 2020-10-28 (×2): qty 1

## 2020-10-28 MED ORDER — SERTRALINE HCL 50 MG PO TABS
50.0000 mg | ORAL_TABLET | Freq: Every day | ORAL | Status: DC
  Administered 2020-10-28: 50 mg via ORAL
  Filled 2020-10-28: qty 1

## 2020-10-28 MED ORDER — ALPRAZOLAM 0.25 MG PO TABS
0.2500 mg | ORAL_TABLET | Freq: Three times a day (TID) | ORAL | Status: DC | PRN
  Administered 2020-10-28: 0.25 mg via ORAL
  Filled 2020-10-28: qty 1

## 2020-10-28 MED ORDER — ONDANSETRON HCL 4 MG/2ML IJ SOLN
4.0000 mg | Freq: Three times a day (TID) | INTRAMUSCULAR | Status: DC | PRN
  Administered 2020-10-28: 4 mg via INTRAVENOUS
  Filled 2020-10-28: qty 2

## 2020-10-28 MED ORDER — ENOXAPARIN SODIUM 40 MG/0.4ML IJ SOSY
40.0000 mg | PREFILLED_SYRINGE | INTRAMUSCULAR | Status: DC
  Administered 2020-10-28: 40 mg via SUBCUTANEOUS
  Filled 2020-10-28: qty 0.4

## 2020-10-28 MED ORDER — BRIMONIDINE TARTRATE 0.2 % OP SOLN
1.0000 [drp] | Freq: Three times a day (TID) | OPHTHALMIC | Status: DC
  Administered 2020-10-28 – 2020-10-29 (×2): 1 [drp] via OPHTHALMIC
  Filled 2020-10-28: qty 5

## 2020-10-28 MED ORDER — ACETAMINOPHEN 325 MG PO TABS: 650.0000 mg | ORAL_TABLET | Freq: Four times a day (QID) | ORAL | Status: DC | PRN

## 2020-10-28 MED ORDER — ALBUTEROL SULFATE HFA 108 (90 BASE) MCG/ACT IN AERS
2.0000 | INHALATION_SPRAY | RESPIRATORY_TRACT | Status: DC | PRN
  Filled 2020-10-28: qty 6.7

## 2020-10-28 MED ORDER — SODIUM CHLORIDE 0.9 % IV SOLN
2.0000 g | Freq: Once | INTRAVENOUS | Status: AC
  Administered 2020-10-28: 2 g via INTRAVENOUS
  Filled 2020-10-28: qty 20

## 2020-10-28 MED ORDER — SODIUM CHLORIDE 0.9 % IV SOLN
500.0000 mg | Freq: Once | INTRAVENOUS | Status: AC
  Administered 2020-10-28: 500 mg via INTRAVENOUS
  Filled 2020-10-28: qty 500

## 2020-10-28 MED ORDER — TRAZODONE HCL 100 MG PO TABS
100.0000 mg | ORAL_TABLET | Freq: Every day | ORAL | Status: DC
  Administered 2020-10-28: 100 mg via ORAL
  Filled 2020-10-28: qty 1

## 2020-10-28 NOTE — H&P (Addendum)
History and Physical    Jordan Gordon CZY:[id number] DOB: 1934/03/13 DOA: 10/28/2020  Referring MD/NP/PA:   PCP: [place]   Patient coming from:  The patient is coming from home.     Chief Complaint:  chest pain and abdominal pain  HPI: Jordan Gordon is a 87 y.o. female with medical history significant of hypertension, hyperlipidemia, GERD, depression with anxiety, hard of hearing, CAD, dementia, who presents chest pain and abdominal pain.  Patient states that her chest pain started today, which is located in the lower chest and substernal area, mild, nonradiating.  Her chest pain has already resolved when saw patient in ED.  She complains of shortness of breath, denies cough, fever or chills.  Patient also complains of lower abdominal pain which has been going on for several days.  She has nausea, no vomiting or diarrhea.  She has abdominal distention.  She denies symptoms of UTI.  No unilateral weakness.  Initially patient was found to have oxygen desaturated to 87% on room air, which improved to 94% on 2L. Her blood pressure is elevated at 214/92 which improved to 146/127 after given 10 mg of labetalol.  ED Course: pt was found to have BNP 186, troponin 11, potassium 3.0, magnesium 2.1, negative COVID PCR, lipase 31, procalcitonin <0.10, liver function normal, temperature normal, tachycardia with heart rate 103, 94, RR 32, 24, chest x-ray showed streaks left basilar opacity.   Review of Systems:   General: no fevers, chills, no body weight gain, has poor appetite, has fatigue HEENT: no blurry vision, hearing changes or sore throat Respiratory: has dyspnea, no coughing, wheezing CV: has chest pain, no palpitations GI: has nausea,  abdominal pain, no diarrhea, constipation, vomiting, GU: no dysuria, burning on urination, increased urinary frequency, hematuria  Ext: no leg edema Neuro: no unilateral weakness, numbness, or tingling, no vision change or hearing loss Skin:  no rash, no skin tear. MSK: No muscle spasm, no deformity, no limitation of range of movement in spin Heme: No easy bruising.  Travel history: No recent long distant travel.  Allergy:  Allergies  Allergen Reactions  . Codeine     nausea    Past Medical History:  Diagnosis Date  . Anxiety   . Coronary artery disease   . GERD (gastroesophageal reflux disease)   . HOH (hard of hearing)   . Hyperlipidemia   . Hypertension   . Major depressive disorder     Past Surgical History:  Procedure Laterality Date  . ABDOMINAL HYSTERECTOMY  1979  . BREAST EXCISIONAL BIOPSY Left 2005   benign  . CATARACT EXTRACTION W/PHACO Left 02/08/2018   Procedure: CATARACT EXTRACTION PHACO AND INTRAOCULAR LENS PLACEMENT (IOC);  Surgeon: Birder Robson, MD;  Location: [facility] ORS;  Service: Ophthalmology;  Laterality: Left;  [place] 00:33.4 AP% 13.9 CDE 4.66 Fluid Pack lot # [id number]  . COLONOSCOPY WITH PROPOFOL N/A 07/22/2015   Procedure: COLONOSCOPY WITH PROPOFOL;  Surgeon: Manya Silvas, MD;  Location: [facility] ENDOSCOPY;  Service: Endoscopy;  Laterality: N/A;  . COLONOSCOPY WITH PROPOFOL N/A 10/19/2016   Procedure: COLONOSCOPY WITH PROPOFOL;  Surgeon: Manya Silvas, MD;  Location: [facility] ENDOSCOPY;  Service: Endoscopy;  Laterality: N/A;  . COLONOSCOPY WITH PROPOFOL N/A 09/18/2020   Procedure: COLONOSCOPY WITH PROPOFOL;  Surgeon: Robert Bellow, MD;  Location: [facility] ENDOSCOPY;  Service: Endoscopy;  Laterality: N/A;  . ESOPHAGOGASTRODUODENOSCOPY (EGD) WITH PROPOFOL N/A 09/18/2020   Procedure: ESOPHAGOGASTRODUODENOSCOPY (EGD) WITH PROPOFOL;  Surgeon: Robert Bellow, MD;  Location: [facility] ENDOSCOPY;  Service: Endoscopy;  Laterality: N/A;  . JOINT REPLACEMENT     TKR  . KNEE SURGERY Left 2011   2007  . TONSILLECTOMY  1940   ADENOIDS    Social History:  reports that she has never smoked. She has never used smokeless tobacco. She reports current alcohol use. She reports that she does not use  drugs.  Family History:  Family History  Problem Relation Age of Onset  . Cancer Mother   . Heart disease Father   . Cancer Brother   . Breast cancer Neg Hx      Prior to Admission medications   Medication Sig Start Date End Date Taking? Authorizing Provider  ALPRAZolam Duanne Moron) 0.25 MG tablet TAKE ONE TABLET 3 TIMES DAILY AS NEEDED FOR ANXIETY 09/02/20  Yes Mar Daring, PA-C  brimonidine (ALPHAGAN) 0.2 % ophthalmic solution 1 drop 3 (three) times daily. 12/15/19  Yes [provider]  dorzolamide-timolol (COSOPT) 22.3-6.8 MG/ML ophthalmic solution 1 drop 2 (two) times daily. 12/15/19  Yes [provider]  losartan (COZAAR) 50 MG tablet Take 1 tablet by mouth daily. 10/25/20  Yes [provider]  sertraline (ZOLOFT) 50 MG tablet Take 1 tablet by mouth daily. 10/25/20  Yes [provider]  traZODone (DESYREL) 100 MG tablet TAKE ONE TABLET BY MOUTH AT BEDTIME 10/17/20  Yes Bacigalupo, Dionne Bucy, MD  triamterene-hydrochlorothiazide (MAXZIDE-25) 37.5-25 MG tablet TAKE ONE TABLET BY MOUTH EVERY DAY 06/06/20  Yes Fenton Malling M, PA-C  losartan (COZAAR) 100 MG tablet Take 1 tablet (100 mg total) by mouth daily. Patient not taking: Reported on 10/28/2020 04/17/20   Mar Daring, PA-C  mupirocin cream (BACTROBAN) 2 % Apply 1 application topically 2 (two) times daily as needed. 04/17/20   Mar Daring, PA-C  ondansetron (ZOFRAN-ODT) 4 MG disintegrating tablet 4 mg every 8 (eight) hours as needed. Patient not taking: No sig reported 09/02/20   [provider]  pantoprazole (PROTONIX) 40 MG tablet Take 1 tablet by mouth 2 (two) times daily. Patient not taking: No sig reported 09/02/20   [provider]  potassium chloride (KLOR-CON) 10 MEQ tablet TAKE 1.5 TABLETS TWICE DAILY Patient not taking: No sig reported 04/08/20   Mar Daring, PA-C  ROCKLATAN 0.02-0.005 % SOLN Apply to eye. Patient not taking: No sig reported  10/15/20   [provider]  sertraline (ZOLOFT) 100 MG tablet Take 1.5 tablets (150 mg total) by mouth daily. Patient not taking: Reported on 10/28/2020 07/17/20   Mar Daring, [place]    Physical Exam: Vitals:   10/28/20 1730 10/28/20 1732 10/28/20 1927 10/28/20 1929  BP: (!) 192/96 (!) 197/94 (!) 191/85 (!) 158/83  Pulse: 84 83 89 87  Resp: 16  18   Temp: 97.9 F (36.6 C)  98.8 F (37.1 C)   TempSrc: Oral  Oral   SpO2: 96% 96% 98%   Weight:      Height:       General: Not in acute distress HEENT:       Eyes: PERRL, EOMI, no scleral icterus.       ENT: No discharge from the ears and nose, no pharynx injection, no tonsillar enlargement.        Neck: No JVD, no bruit, no mass felt. Heme: No neck lymph node enlargement. Cardiac: S1/S2, RRR, No murmurs, No gallops or rubs. Respiratory: No rales, wheezing, rhonchi or rubs. GI: distended, has tenderness in lower abdomen, no rebound pain, no organomegaly,  BS present. GU: No hematuria Ext: No pitting leg edema bilaterally. 1+DP/PT pulse bilaterally. Musculoskeletal: No joint deformities, No joint redness or warmth, no limitation of ROM in spin. Skin: No rashes.  Neuro: Alert, oriented X3, cranial nerves II-XII grossly intact, moves all extremities normally.  Psych: Patient is not psychotic, no suicidal or hemocidal ideation.  Labs on Admission: I have personally reviewed following labs and imaging studies  CBC: Recent Labs  Lab 10/24/20 0822 10/28/20 0845  WBC 5.4 5.1  NEUTROABS  --  3.4  HGB 13.9 13.2  HCT 39.0 37.0  MCV 86.7 86.7  PLT 168 557   Basic Metabolic Panel: Recent Labs  Lab 10/24/20 0822 10/28/20 0845  NA 143 142  K 3.1* 3.0*  CL 109 105  CO2 25 28  GLUCOSE 114* 115*  BUN 16 20  CREATININE 0.77 0.79  CALCIUM 8.6* 8.9  MG  --  2.1   GFR: Estimated Creatinine Clearance: 50.5 mL/min (by C-G formula based on SCr of 0.79 mg/dL). Liver Function Tests: Recent Labs  Lab 10/28/20 0845  AST  20  ALT 17  ALKPHOS 64  BILITOT 0.9  PROT 6.8  ALBUMIN 3.9   Recent Labs  Lab 10/24/20 0822 10/28/20 0845  LIPASE 29 31   No results for input(s): AMMONIA in the last 168 hours. Coagulation Profile: No results for input(s): INR, PROTIME in the last 168 hours. Cardiac Enzymes: No results for input(s): CKTOTAL, CKMB, CKMBINDEX, TROPONINI in the last 168 hours. BNP (last 3 results) No results for input(s): PROBNP in the last 8760 hours. HbA1C: No results for input(s): HGBA1C in the last 72 hours. CBG: No results for input(s): GLUCAP in the last 168 hours. Lipid Profile: No results for input(s): CHOL, HDL, LDLCALC, TRIG, CHOLHDL, LDLDIRECT in the last 72 hours. Thyroid Function Tests: No results for input(s): TSH, T4TOTAL, FREET4, T3FREE, THYROIDAB in the last 72 hours. Anemia Panel: No results for input(s): VITAMINB12, FOLATE, FERRITIN, TIBC, IRON, RETICCTPCT in the last 72 hours. Urine analysis:    Component Value Date/Time   COLORURINE STRAW (A) 10/24/2020 1030   APPEARANCEUR CLEAR (A) 10/24/2020 1030   APPEARANCEUR Hazy 01/12/2013 0816   LABSPEC 1.009 10/24/2020 1030   LABSPEC 1.016 01/12/2013 0816   PHURINE 7.0 10/24/2020 1030   GLUCOSEU NEGATIVE 10/24/2020 1030   GLUCOSEU Negative 01/12/2013 0816   HGBUR NEGATIVE 10/24/2020 1030   BILIRUBINUR NEGATIVE 10/24/2020 1030   BILIRUBINUR negative 08/27/2020 1054   BILIRUBINUR Negative 01/12/2013 0816   KETONESUR NEGATIVE 10/24/2020 1030   PROTEINUR NEGATIVE 10/24/2020 1030   UROBILINOGEN 0.2 08/27/2020 1054   NITRITE NEGATIVE 10/24/2020 1030   LEUKOCYTESUR NEGATIVE 10/24/2020 1030   LEUKOCYTESUR 1+ 01/12/2013 0816   Sepsis Labs: @LABRCNTIP (procalcitonin:4,lacticidven:4) ) Recent Results (from the past 240 hour(s))  Resp Panel by RT-PCR (Flu A&B, Covid) Nasopharyngeal Swab     Status: None   Collection Time: 10/28/20  9:27 AM   Specimen: Nasopharyngeal Swab; Nasopharyngeal(NP) swabs in vial transport medium   Result Value Ref Range Status   SARS Coronavirus 2 by RT PCR NEGATIVE NEGATIVE Final    Comment: (NOTE) SARS-CoV-2 target nucleic acids are NOT DETECTED.  The SARS-CoV-2 RNA is generally detectable in upper respiratory specimens during the acute phase of infection. The lowest concentration of SARS-CoV-2 viral copies this assay can detect is 138 copies/mL. A negative result does not preclude SARS-Cov-2 infection and should not be used as the sole basis for treatment or other patient management decisions. A negative result Dobek occur with  improper specimen collection/handling, submission of specimen other than nasopharyngeal swab, presence of viral mutation(s) within the areas targeted by this assay, and inadequate number of viral copies(<138 copies/mL). A negative result must be combined with clinical observations, patient history, and epidemiological information. The expected result is Negative.  Fact Sheet for Patients:  EntrepreneurPulse.com.au  Fact Sheet for Healthcare Providers:  IncredibleEmployment.be  This test is no t yet approved or cleared by the [place] FDA and  has been authorized for detection and/or diagnosis of SARS-CoV-2 by FDA under an Emergency Use Authorization (EUA). This EUA will remain  in effect (meaning this test can be used) for the duration of the COVID-19 declaration under Section 564(b)(1) of the Act, 21 U.S.C.section 360bbb-3(b)(1), unless the authorization is terminated  or revoked sooner.       Influenza A by PCR NEGATIVE NEGATIVE Final   Influenza B by PCR NEGATIVE NEGATIVE Final    Comment: (NOTE) The Xpert Xpress SARS-CoV-2/FLU/RSV plus assay is intended as an aid in the diagnosis of influenza from Nasopharyngeal swab specimens and should not be used as a sole basis for treatment. Nasal washings and aspirates are unacceptable for Xpert Xpress SARS-CoV-2/FLU/RSV testing.  Fact Sheet for  Patients: EntrepreneurPulse.com.au  Fact Sheet for Healthcare Providers: IncredibleEmployment.be  This test is not yet approved or cleared by the [place] FDA and has been authorized for detection and/or diagnosis of SARS-CoV-2 by FDA under an Emergency Use Authorization (EUA). This EUA will remain in effect (meaning this test can be used) for the duration of the COVID-19 declaration under Section 564(b)(1) of the Act, 21 U.S.C. section 360bbb-3(b)(1), unless the authorization is terminated or revoked.  Performed at [facility], [address] [place], [place] [address]      Radiological Exams on Admission: DG Chest Portable 1 View  Result Date: 10/28/2020 CLINICAL DATA:  87 year old female with chest pain, back pain and shortness of breath. EXAM: PORTABLE CHEST 1 VIEW COMPARISON:  Chest radiographs 10/24/2020 and earlier. FINDINGS: Portable AP upright view at 0835 hours. Stable lung volumes and mediastinal contours. Streaky left lung base peribronchial opacity has mildly regressed. No pneumothorax, pulmonary edema, pleural effusion or other confluent opacity. Visualized tracheal air column is within normal limits. No acute osseous abnormality identified. IMPRESSION: Streaky left lung base opacity has mildly regressed since 10/24/2020. This is nonspecific but might reflect regression of a bronchopneumonia. Query signs/symptoms of infection. Electronically Signed   By: Genevie Ann M.D.   On: 10/28/2020 09:14     EKG: I have personally reviewed.  Sinus rhythm, QTC 452, poor R wave progression, nonspecific to change Assessment/Plan Principal Problem:   Hypertensive urgency Active Problems:   HLD (hyperlipidemia)   Chest pain   HTN (hypertension)   Depression with anxiety   Acute respiratory failure with hypoxia (HCC)   Hypokalemia   CAD (coronary artery disease)   Hypertensive urgency and hx of HTN (hypertension): Her blood pressure  is elevated at 214/92 which improved to 146/127 after given 10 mg of labetalol.  -will admitted to progressive bed as inpatient -IV hydralazine as needed -Continue home Cozaar and Maxide  Chest pain and CAD: Likely due to demand ischemia secondary to hypertensive urgency.  Troponin level 11.  Chest pain has already resolved with improvement of hypertension.  Initially patient was suspected to have pneumonia since chest x-ray showed streaks opacity in left basilar area, but the patient does not have fever, no leukocytosis, procalcitonin <0.10.  Clinically patient does not seem to have  pneumonia.  Patient received 1 dose of Rocephin and azithromycin in ED, will not continue antibiotics. -Trend troponin -Aspirin -Check A1c, FLP  HLD (hyperlipidemia): Not taking medications currently -Follow-up FLP -Follow-up with  Depression with anxiety -Continue home medications  Hypokalemia: Potassium 3.0, magnesium 2.1 -Repleted potassium  Abdominal pain: Etiology is not clear.  Patient has nausea, no vomiting or diarrhea.  Lipase normal. -Follow-up CT scan of abdomen/pelvis -As needed morphine for pain, Zofran for nausea  Acute respiratory failure with hypoxia [facility]): Oxygen desaturated to 87% on room air, improved to 94% on 2 L oxygen.  Chest pain has resolved.  Low suspicions of for PE.  [place] be related to hypertensive urgency.  BNP 186, but no leg edema or JVD, does not seem to have CHF. -Supportive care, -Nasal cannula oxygen to maintain oxygen saturation above 93% -As needed albuterol    DVT ppx:  SQ Lovenox Code Status: DNR (I discussed with patient in the presence of her daughter in-law, and explained the meaning of CODE STATUS. Patient wants to be DNR) Family Communication: Yes, patient's daughter-in-law   at bed side Disposition Plan:  Anticipate discharge back to previous environment Consults called:  none Admission status and Level of care: Progressive Cardiac:   as inpt      Status  is: Inpatient  Remains inpatient appropriate because:Inpatient level of care appropriate due to severity of illness   Dispo: The patient is from: Home              Anticipated d/c is to: Home              Patient currently is not medically stable to d/c.   Difficult to place patient No          Date of Service 10/28/2020    [facility]   If 7PM-7AM, please contact night-coverage [email or web address] 10/28/2020, 7:34 PM

## 2020-10-28 NOTE — ED Notes (Signed)
When up to toilet in room pt had dyspnea with exertion and saturations temporarily decreased to 87% with good wave form then quickly returned to 95% when back in bed resting.

## 2020-10-28 NOTE — ED Triage Notes (Signed)
Pt arrives via ems from home called out for heart problems. Ems report generalized weakness. Feels "shakey inside", c/o epigastric pain. Nausea.  Took all morning meds prior to arrival. Seen here last week for similar complaints. md present to access at this time   Ems admin 4mg  zofran.  223/105 20g left AC IV

## 2020-10-28 NOTE — ED Provider Notes (Signed)
[facility] Emergency Department Provider Note  ____________________________________________  Time seen: Approximately 11:42 AM  I have reviewed the triage vital signs and the nursing notes.   HISTORY  Chief Complaint Abdominal Pain and Hypertension    HPI Jordan Gordon is a 87 y.o. female with a history of GERD hypertension CAD who comes to the ED complaining of generalized weakness and lower chest pain for the past week.  Has some mild shortness of breath as well, no cough.  Denies exertional symptoms or pleuritic symptoms.  Chest pain feels like aching.  Nonradiating.  Mild, 3/10 in intensity.  No alleviating factors.      Past Medical History:  Diagnosis Date  . Anxiety   . Coronary artery disease   . GERD (gastroesophageal reflux disease)   . HOH (hard of hearing)   . Hyperlipidemia   . Hypertension   . Major depressive disorder      Patient Active Problem List   Diagnosis Date Noted  . Major depressive disorder, recurrent episode, moderate ([place]) 01/13/2019  . Mechanical and motor problems with internal organs 04/18/2015  . Abnormal foot pulse 04/18/2015  . Clinical depression 04/18/2015  . Fatigue 04/18/2015  . Blood glucose elevated 04/18/2015  . Decreased potassium in the blood 04/18/2015  . Low back pain 04/18/2015  . Paresthesia of skin 04/18/2015  . Gastro-esophageal reflux disease with esophagitis 04/18/2015  . Anxiety 01/07/2015  . Chest pain 09/20/2014  . History of cardiac catheterization 03/08/2014  . Disorder resulting from impaired renal function 07/29/2009  . Muscle ache 03/15/2009  . SOB (shortness of breath) on exertion 09/26/2008  . Adaptation reaction 07/21/2006  . Osteoarthritis 07/21/2006  . 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 Essential (primary) hypertension 04/16/1998  . Allergic rhinitis 03/12/1998  . Insomnia 03/04/1998  . Menopausal and perimenopausal disorder 02/02/1997  . HLD (hyperlipidemia)  12/06/1991     Past Surgical History:  Procedure Laterality Date  . ABDOMINAL HYSTERECTOMY  1979  . BREAST EXCISIONAL BIOPSY Left 2005   benign  . CATARACT EXTRACTION W/PHACO Left 02/08/2018   Procedure: CATARACT EXTRACTION PHACO AND INTRAOCULAR LENS PLACEMENT (IOC);  Surgeon: Birder Robson, MD;  Location: [facility] ORS;  Service: Ophthalmology;  Laterality: Left;  [place] 00:33.4 AP% 13.9 CDE 4.66 Fluid Pack lot # [id number]  . COLONOSCOPY WITH PROPOFOL N/A 07/22/2015   Procedure: COLONOSCOPY WITH PROPOFOL;  Surgeon: Manya Silvas, MD;  Location[facility] ENDOSCOPY;  Service: Endoscopy;  Laterality: N/A;  . COLONOSCOPY WITH PROPOFOL N/A 10/19/2016   Procedure: COLONOSCOPY WITH PROPOFOL;  Surgeon: Manya Silvas, MD;  Location: [facility] ENDOSCOPY;  Service: Endoscopy;  Laterality: N/A;  . COLONOSCOPY WITH PROPOFOL N/A 09/18/2020   Procedure: COLONOSCOPY WITH PROPOFOL;  Surgeon: Robert Bellow, MD;  Location: [facility] ENDOSCOPY;  Service: Endoscopy;  Laterality: N/A;  . ESOPHAGOGASTRODUODENOSCOPY (EGD) WITH PROPOFOL N/A 09/18/2020   Procedure: ESOPHAGOGASTRODUODENOSCOPY (EGD) WITH PROPOFOL;  Surgeon: Robert Bellow, MD;  Location: [facility] ENDOSCOPY;  Service: Endoscopy;  Laterality: N/A;  . JOINT REPLACEMENT     TKR  . KNEE SURGERY Left 2011   2007  . TONSILLECTOMY  1940   ADENOIDS     Prior to Admission medications   Medication Sig Start Date End Date Taking? Authorizing Provider  ALPRAZolam Duanne Moron) 0.25 MG tablet TAKE ONE TABLET 3 TIMES DAILY AS NEEDED FOR ANXIETY 09/02/20  Yes Mar Daring, PA-C  brimonidine (ALPHAGAN) 0.2 % ophthalmic solution 1 drop 3 (three) times daily. 12/15/19  Yes [provider]  dorzolamide-timolol (COSOPT) 22.3-6.8 MG/ML ophthalmic solution 1 drop 2 (two) times daily. 12/15/19  Yes [provider]  losartan (COZAAR) 50 MG tablet Take 1 tablet by mouth daily. 10/25/20  Yes [provider]  sertraline (ZOLOFT) 50 MG tablet Take 1 tablet by  mouth daily. 10/25/20  Yes [provider]  traZODone (DESYREL) 100 MG tablet TAKE ONE TABLET BY MOUTH AT BEDTIME 10/17/20  Yes Bacigalupo, Dionne Bucy, MD  triamterene-hydrochlorothiazide (MAXZIDE-25) 37.5-25 MG tablet TAKE ONE TABLET BY MOUTH EVERY DAY 06/06/20  Yes Fenton Malling M, PA-C  losartan (COZAAR) 100 MG tablet Take 1 tablet (100 mg total) by mouth daily. Patient not taking: Reported on 10/28/2020 04/17/20   Mar Daring, PA-C  mupirocin cream (BACTROBAN) 2 % Apply 1 application topically 2 (two) times daily as needed. 04/17/20   Mar Daring, PA-C  ondansetron (ZOFRAN-ODT) 4 MG disintegrating tablet 4 mg every 8 (eight) hours as needed. Patient not taking: No sig reported 09/02/20   [provider]  pantoprazole (PROTONIX) 40 MG tablet Take 1 tablet by mouth 2 (two) times daily. Patient not taking: No sig reported 09/02/20   [provider]  potassium chloride (KLOR-CON) 10 MEQ tablet TAKE 1.5 TABLETS TWICE DAILY Patient not taking: No sig reported 04/08/20   Mar Daring, PA-C  ROCKLATAN 0.02-0.005 % SOLN Apply to eye. Patient not taking: No sig reported 10/15/20   [provider]  sertraline (ZOLOFT) 100 MG tablet Take 1.5 tablets (150 mg total) by mouth daily. Patient not taking: Reported on 10/28/2020 07/17/20   Mar Daring, PA-C     Allergies Codeine   Family History  Problem Relation Age of Onset  . Cancer Mother   . Heart disease Father   . Cancer Brother   . Breast cancer Neg Hx     Social History Social History   Tobacco Use  . Smoking status: Never Smoker  . Smokeless tobacco: Never Used  Vaping Use  . Vaping Use: Never used  Substance Use Topics  . Alcohol use: Yes    Alcohol/week: 0.0 standard drinks    Comment: wine once every 2 weeks  . Drug use: No    Review of Systems  Constitutional:   No fever or chills.  ENT:   No sore throat. No rhinorrhea. Cardiovascular: Positive chest pain  as above without syncope. Respiratory: Positive shortness of breath without cough. Gastrointestinal:   Negative for abdominal pain, vomiting and diarrhea.  Musculoskeletal:   Negative for focal pain or swelling All other systems reviewed and are negative except as documented above in ROS and HPI.  ____________________________________________   PHYSICAL EXAM:  VITAL SIGNS: ED Triage Vitals  Enc Vitals Group     BP 10/28/20 0832 (!) 214/92     Pulse Rate 10/28/20 0832 90     Resp 10/28/20 0832 16     Temp 10/28/20 0832 98.2 F (36.8 C)     Temp Source 10/28/20 0832 Oral     SpO2 10/28/20 0832 98 %     Weight 10/28/20 0834 154 lb 5.2 oz (70 kg)     Height 10/28/20 0834 5\' 5"  (1.651 m)     Head Circumference --      Peak Flow --      Pain Score 10/28/20 0833 4     Pain Loc --      Pain Edu? --      Excl. in [place]? --     Vital signs  reviewed, nursing assessments reviewed.   Constitutional:   Alert and oriented. Non-toxic appearance. Eyes:   Conjunctivae are normal. EOMI. PERRL. ENT      Head:   Normocephalic and atraumatic.      Nose:   Wearing a mask.      Mouth/Throat:   Wearing a mask.      Neck:   No meningismus. Full ROM. Hematological/Lymphatic/Immunilogical:   No cervical lymphadenopathy. Cardiovascular:   RRR. Symmetric bilateral radial and DP pulses.  No murmurs. Cap refill less than 2 seconds. Respiratory:   Normal respiratory effort without tachypnea/retractions.  There are crackles at the left base Gastrointestinal:   Soft and nontender. Non distended. There is no CVA tenderness.  No rebound, rigidity, or guarding. Genitourinary:   deferred Musculoskeletal:   Normal range of motion in all extremities. No joint effusions.  No lower extremity tenderness.  Symmetric calf circumference.  No edema.  Chest pain reproducible on exam, examined with nurse Cecille Rubin at bedside. Neurologic:   Normal speech and language.  Motor grossly intact. No acute focal neurologic deficits  are appreciated.  Skin:    Skin is warm, dry and intact. No rash noted.  No petechiae, purpura, or bullae.  ____________________________________________    LABS (pertinent positives/negatives) (all labs ordered are listed, but only abnormal results are displayed) Labs Reviewed  COMPREHENSIVE METABOLIC PANEL - Abnormal; Notable for the following components:      Result Value   Potassium 3.0 (*)    Glucose, Bld 115 (*)    All other components within normal limits  RESP PANEL BY RT-PCR (FLU A&B, COVID) ARPGX2  LIPASE, BLOOD  CBC WITH DIFFERENTIAL/PLATELET   ____________________________________________   EKG Interpreted by me Sinus rhythm rate of 94, normal axis and intervals.  Normal QRS ST segments and T waves.  No acute ischemic changes.   ____________________________________________    RADIOLOGY  DG Chest Portable 1 View  Result Date: 10/28/2020 CLINICAL DATA:  87 year old female with chest pain, back pain and shortness of breath. EXAM: PORTABLE CHEST 1 VIEW COMPARISON:  Chest radiographs 10/24/2020 and earlier. FINDINGS: Portable AP upright view at 0835 hours. Stable lung volumes and mediastinal contours. Streaky left lung base peribronchial opacity has mildly regressed. No pneumothorax, pulmonary edema, pleural effusion or other confluent opacity. Visualized tracheal air column is within normal limits. No acute osseous abnormality identified. IMPRESSION: Streaky left lung base opacity has mildly regressed since 10/24/2020. This is nonspecific but might reflect regression of a bronchopneumonia. Query signs/symptoms of infection. Electronically Signed   By: Genevie Ann M.D.   On: 10/28/2020 09:14    ____________________________________________   PROCEDURES Procedures  ____________________________________________  DIFFERENTIAL DIAGNOSIS   Musculoskeletal pain, electrolyte abnormality, pneumonia, pneumothorax, pleural effusion, dehydration, viral illness  CLINICAL IMPRESSION  / ASSESSMENT AND PLAN / ED COURSE  Medications ordered in the ED: Medications  cefTRIAXone (ROCEPHIN) 2 g in sodium chloride 0.9 % 100 mL IVPB (2 g Intravenous New Bag/Given 10/28/20 1135)  azithromycin (ZITHROMAX) 500 mg in sodium chloride 0.9 % 250 mL IVPB (has no administration in time range)  sodium chloride 0.9 % bolus 500 mL (0 mLs Intravenous Stopped 10/28/20 1137)  potassium chloride SA (KLOR-CON) CR tablet 40 mEq (40 mEq Oral Given 10/28/20 0922)  alum & mag hydroxide-simeth (MAALOX/MYLANTA) 200-200-20 MG/5ML suspension 30 mL (30 mLs Oral Given 10/28/20 0922)  ondansetron (ZOFRAN-ODT) disintegrating tablet 8 mg (8 mg Oral Given 10/28/20 [id number])    Pertinent labs & imaging results that were available during my care of  the patient were reviewed by me and considered in my medical decision making (see chart for details).  Jordan Gordon was evaluated in Emergency Department on 10/28/2020 for the symptoms described in the history of present illness. She was evaluated in the context of the global COVID-19 pandemic, which necessitated consideration that the patient might be at risk for infection with the SARS-CoV-2 virus that causes COVID-19. Institutional protocols and algorithms that pertain to the evaluation of patients at risk for COVID-19 are in a state of rapid change based on information released by regulatory bodies including the CDC and federal and state organizations. These policies and algorithms were followed during the patient's care in the ED.   Patient presents with atypical chest pain and shortness of breath with malaise.   Vital signs unremarkable except for uncontrolled hypertension.  However, during her time in the ED she was found to desaturate to 87% while ambulating in the room to the bathroom, and later found to desaturate to 87% at rest on room air.  Placed on 2 L nasal cannula to maintain normoxia.  Labs unremarkable.  Ordered Rocephin and azithromycin for community-acquired  pneumonia given chest x-ray showing left basilar opacity suggestive of bronchopneumonia.  Case discussed with hospitalist for further management.       ____________________________________________   FINAL CLINICAL IMPRESSION(S) / ED DIAGNOSES    Final diagnoses:  Community acquired pneumonia, unspecified laterality  Acute respiratory failure with hypoxia ([place])  Uncontrolled hypertension     ED Discharge Orders    None      Portions of this note were generated with dragon dictation software. Dictation errors Cristobal occur despite best attempts at proofreading.   Carrie Mew, MD 10/28/20 1147

## 2020-10-29 LAB — URINALYSIS, COMPLETE (UACMP) WITH MICROSCOPIC
Bacteria, UA: NONE SEEN
Bilirubin Urine: NEGATIVE
Glucose, UA: NEGATIVE mg/dL
Hgb urine dipstick: NEGATIVE
Ketones, ur: NEGATIVE mg/dL
Leukocytes,Ua: NEGATIVE
Nitrite: NEGATIVE
Protein, ur: NEGATIVE mg/dL
Specific Gravity, Urine: 1.015 (ref 1.005–1.030)
pH: 7 (ref 5.0–8.0)

## 2020-10-29 LAB — LIPID PANEL
Cholesterol: 191 mg/dL (ref 0–200)
HDL: 48 mg/dL (ref >40)
LDL Cholesterol: 116 mg/dL — ABNORMAL HIGH (ref 0–99)
Total CHOL/HDL Ratio: 4 RATIO
Triglycerides: 137 mg/dL (ref <150)
VLDL: 27 mg/dL (ref 0–40)

## 2020-10-29 LAB — HEMOGLOBIN A1C
Hgb A1c MFr Bld: 5.2 % (ref 4.8–5.6)
Mean Plasma Glucose: 103 mg/dL

## 2020-10-29 LAB — POTASSIUM: Potassium: 3.6 mmol/L (ref 3.5–5.1)

## 2020-10-29 MED ORDER — LOSARTAN POTASSIUM 50 MG PO TABS
100.0000 mg | ORAL_TABLET | Freq: Every day | ORAL | Status: DC
  Administered 2020-10-29: 100 mg via ORAL
  Filled 2020-10-29: qty 2

## 2020-10-29 MED ORDER — PANTOPRAZOLE SODIUM 40 MG PO TBEC
40.0000 mg | DELAYED_RELEASE_TABLET | Freq: Two times a day (BID) | ORAL | Status: DC
  Administered 2020-10-29: 40 mg via ORAL
  Filled 2020-10-29: qty 1

## 2020-10-29 MED ORDER — SERTRALINE HCL 50 MG PO TABS: 150.0000 mg | ORAL_TABLET | Freq: Every day | ORAL | 0 refills | Status: DC

## 2020-10-29 MED ORDER — ASPIRIN 81 MG PO TBEC: 81.0000 mg | DELAYED_RELEASE_TABLET | Freq: Every day | ORAL | 11 refills | Status: DC

## 2020-10-29 MED ORDER — HYDRALAZINE HCL 20 MG/ML IJ SOLN
10.0000 mg | INTRAMUSCULAR | Status: DC | PRN
  Administered 2020-10-29: 10 mg via INTRAVENOUS

## 2020-10-29 NOTE — Care Management Obs Status (Signed)
MEDICARE OBSERVATION STATUS NOTIFICATION   Patient Details  Name: Jordan Gordon MRN: [id number] Date of Birth: 1933/09/05   Medicare Observation Status Notification Given:  Yes    Ziare Cryder A Levie Wages, LCSW 10/29/2020, 10:30 AM

## 2020-10-29 NOTE — Discharge Summary (Signed)
[facility] NAME: Jordan Gordon    MR#:  [id number]  DATE OF BIRTH:  Lenn 20, 1935  DATE OF ADMISSION:  10/28/2020 ADMITTING PHYSICIAN: Fritzi Mandes, MD  DATE OF DISCHARGE: 10/29/2020  PRIMARY CARE PHYSICIAN: [place]    ADMISSION DIAGNOSIS:  Hypertensive urgency [I16.0] Acute respiratory failure with hypoxia ([place]) [J96.01] Uncontrolled hypertension [I10] Chest pain [R07.9] Community acquired pneumonia, unspecified laterality [J18.9]  DISCHARGE DIAGNOSIS:  uncontrolled hypertension  SECONDARY DIAGNOSIS:   Past Medical History:  Diagnosis Date  . Anxiety   . Coronary artery disease   . GERD (gastroesophageal reflux disease)   . HOH (hard of hearing)   . Hyperlipidemia   . Hypertension   . Major depressive disorder     HOSPITAL COURSE:  Jordan Gordon is a 87 y.o. female with medical history significant of hypertension, hyperlipidemia, GERD, depression with anxiety, hard of hearing, CAD, dementia, who presents chest pain and abdominal pain.  Uncontrolled hypertension suspect due to medication noncompliance -- much improved now after resuming losartan and Maxizide 25 -- patient received couple doses of IV BP meds PRN -- discussed with patient and son [name] in the room importance for taking BP meds correctly, keeping log of blood pressure and follow-up with PCP -- patient voiced understanding  Hyperlipidemia -- not taking any meds  Depression/anxiety -- can you Zoloft and trazodone  Abdominal pain appears chronic. CT abdomen unremarkable -- patient has issues with constipation according to the son. Recommend daily stool softener  Chest pain appears nonspecific -- could be due to high blood pressure. Did not have any chest pain today. Cardiac markers times three negative -- no acute EKG changes -- continue baby aspirin  Macular degeneration and glaucoma with blurred vision -- [facility] eyedrops  Discussed with patient's son discharge plan. Patient is in agreement with discharge as well. Follow-up with PCP as outpatient  CONSULTS OBTAINED:    DRUG ALLERGIES:   Allergies  Allergen Reactions  . Codeine     nausea    DISCHARGE MEDICATIONS:   Allergies as of 10/29/2020      Reactions   Codeine    nausea      Medication List    STOP taking these medications   ondansetron 4 MG disintegrating tablet Commonly known as: ZOFRAN-ODT   potassium chloride 10 MEQ tablet Commonly known as: KLOR-CON   Rocklatan 0.02-0.005 % Soln Generic drug: Netarsudil-Latanoprost     TAKE these medications   ALPRAZolam 0.25 MG tablet Commonly known as: XANAX TAKE ONE TABLET 3 TIMES DAILY AS NEEDED FOR ANXIETY   aspirin 81 MG EC tablet Take 1 tablet (81 mg total) by mouth daily. Swallow whole. Start taking on: [place] 25, 2022   brimonidine 0.2 % ophthalmic solution Commonly known as: ALPHAGAN 1 drop 3 (three) times daily.   dorzolamide-timolol 22.3-6.8 MG/ML ophthalmic solution Commonly known as: COSOPT 1 drop 2 (two) times daily.   losartan 100 MG tablet Commonly known as: COZAAR Take 1 tablet (100 mg total) by mouth daily. What changed: Another medication with the same name was removed. Continue taking this medication, and follow the directions you see here.   mupirocin cream 2 % Commonly known as: Bactroban Apply 1 application topically 2 (two) times daily as needed.   pantoprazole 40 MG tablet Commonly known as: PROTONIX Take 1 tablet by mouth 2 (two) times daily.   sertraline 50 MG tablet Commonly known as:  ZOLOFT Take 3 tablets (150 mg total) by mouth at bedtime. What changed:   how much to take  when to take this  Another medication with the same name was removed. Continue taking this medication, and follow the directions you see here.   traZODone 100 MG tablet Commonly known as: DESYREL TAKE ONE TABLET BY MOUTH AT BEDTIME    triamterene-hydrochlorothiazide 37.5-25 MG tablet Commonly known as: MAXZIDE-25 TAKE ONE TABLET BY MOUTH EVERY DAY       If you experience worsening of your admission symptoms, develop shortness of breath, life threatening emergency, suicidal or homicidal thoughts you must seek medical attention immediately by calling 911 or calling your MD immediately  if symptoms less severe.  You Must read complete instructions/literature along with all the possible adverse reactions/side effects for all the Medicines you take and that have been prescribed to you. Take any new Medicines after you have completely understood and accept all the possible adverse reactions/side effects.   Please note  You were cared for by a hospitalist during your hospital stay. If you have any questions about your discharge medications or the care you received while you were in the hospital after you are discharged, you can call the unit and asked to speak with the hospitalist on call if the hospitalist that took care of you is not available. Once you are discharged, your primary care physician will handle any further medical issues. Please note that NO REFILLS for any discharge medications will be authorized once you are discharged, as it is imperative that you return to your primary care physician (or establish a relationship with a primary care physician if you do not have one) for your aftercare needs so that they can reassess your need for medications and monitor your lab values. Today   SUBJECTIVE   Son in the room. I feel a whole lot better blood pressure improved VITAL SIGNS:  Blood pressure (!) 171/84, pulse 86, temperature 98.4 F (36.9 C), resp. rate 16, height 5\' 5"  (1.651 m), weight 74.4 kg, SpO2 93 %.  I/O:    Intake/Output Summary (Last 24 hours) at 10/29/2020 1416 Last data filed at 10/29/2020 0957 Gross per 24 hour  Intake 480 ml  Output --  Net 480 ml    PHYSICAL EXAMINATION:  GENERAL:  87  y.o.-year-old patient lying in the bed with no acute distress.  LUNGS: Normal breath sounds bilaterally, no wheezing, rales,rhonchi or crepitation. No use of accessory muscles of respiration.  CARDIOVASCULAR: S1, S2 normal. No murmurs, rubs, or gallops.  ABDOMEN: Soft, non-tender, non-distended. Bowel sounds present. No organomegaly or mass.  EXTREMITIES: No pedal edema, cyanosis, or clubbing.  NEUROLOGIC: non focal PSYCHIATRIC: patient is alert and oriented x 3.  SKIN: No obvious rash, lesion, or ulcer.   DATA REVIEW:   CBC  Recent Labs  Lab 10/28/20 0845  WBC 5.1  HGB 13.2  HCT 37.0  PLT 175    Chemistries  Recent Labs  Lab 10/28/20 0845 10/29/20 0832  NA 142  --   K 3.0* 3.6  CL 105  --   CO2 28  --   GLUCOSE 115*  --   BUN 20  --   CREATININE 0.79  --   CALCIUM 8.9  --   MG 2.1  --   AST 20  --   ALT 17  --   ALKPHOS 64  --   BILITOT 0.9  --     Microbiology Results   Recent Results (  from the past 240 hour(s))  Resp Panel by RT-PCR (Flu A&B, Covid) Nasopharyngeal Swab     Status: None   Collection Time: 10/28/20  9:27 AM   Specimen: Nasopharyngeal Swab; Nasopharyngeal(NP) swabs in vial transport medium  Result Value Ref Range Status   SARS Coronavirus 2 by RT PCR NEGATIVE NEGATIVE Final    Comment: (NOTE) SARS-CoV-2 target nucleic acids are NOT DETECTED.  The SARS-CoV-2 RNA is generally detectable in upper respiratory specimens during the acute phase of infection. The lowest concentration of SARS-CoV-2 viral copies this assay can detect is 138 copies/mL. A negative result does not preclude SARS-Cov-2 infection and should not be used as the sole basis for treatment or other patient management decisions. A negative result [place] occur with  improper specimen collection/handling, submission of specimen other than nasopharyngeal swab, presence of viral mutation(s) within the areas targeted by this assay, and inadequate number of viral copies(<138 copies/mL).  A negative result must be combined with clinical observations, patient history, and epidemiological information. The expected result is Negative.  Fact Sheet for Patients:  EntrepreneurPulse.com.au  Fact Sheet for Healthcare Providers:  IncredibleEmployment.be  This test is no t yet approved or cleared by the [place] FDA and  has been authorized for detection and/or diagnosis of SARS-CoV-2 by FDA under an Emergency Use Authorization (EUA). This EUA will remain  in effect (meaning this test can be used) for the duration of the COVID-19 declaration under Section 564(b)(1) of the Act, 21 U.S.C.section 360bbb-3(b)(1), unless the authorization is terminated  or revoked sooner.       Influenza A by PCR NEGATIVE NEGATIVE Final   Influenza B by PCR NEGATIVE NEGATIVE Final    Comment: (NOTE) The Xpert Xpress SARS-CoV-2/FLU/RSV plus assay is intended as an aid in the diagnosis of influenza from Nasopharyngeal swab specimens and should not be used as a sole basis for treatment. Nasal washings and aspirates are unacceptable for Xpert Xpress SARS-CoV-2/FLU/RSV testing.  Fact Sheet for Patients: EntrepreneurPulse.com.au  Fact Sheet for Healthcare Providers: IncredibleEmployment.be  This test is not yet approved or cleared by the [place] FDA and has been authorized for detection and/or diagnosis of SARS-CoV-2 by FDA under an Emergency Use Authorization (EUA). This EUA will remain in effect (meaning this test can be used) for the duration of the COVID-19 declaration under Section 564(b)(1) of the Act, 21 U.S.C. section 360bbb-3(b)(1), unless the authorization is terminated or revoked.  Performed at [facility], [address] [place], [place] [address]     RADIOLOGY:  CT ABDOMEN PELVIS WO CONTRAST  Result Date: 10/29/2020 CLINICAL DATA:  Abdominal pain with distention, has nausea, no  vomiting or diarrhea. No fever or chills. EXAM: CT ABDOMEN AND PELVIS WITHOUT CONTRAST TECHNIQUE: Multidetector CT imaging of the abdomen and pelvis was performed following the standard protocol without IV contrast. COMPARISON:  None. FINDINGS: Lower chest: Bilateral lower lobe subsegmental atelectasis. Diffuse bronchial wall thickening. Coronary artery calcifications. Trace pericardial effusion. Tiny hiatal hernia. Hepatobiliary: No focal liver abnormality. No gallstones, gallbladder wall thickening, or pericholecystic fluid. No biliary dilatation. Pancreas: Annular pancreas. Diffusely atrophic. No focal lesion. Otherwise normal pancreatic contour. No surrounding inflammatory changes. No main pancreatic ductal dilatation. Spleen: Normal in size without focal abnormality. Adrenals/Urinary Tract: No adrenal nodule bilaterally. Interval development of a left parapelvic fluid density lesion measuring up to 2.3 cm. No nephrolithiasis, no hydronephrosis, and no contour-deforming renal mass. No ureterolithiasis or hydroureter. The urinary bladder is unremarkable. Stomach/Bowel: Stomach is within normal limits. No  evidence of bowel wall thickening or dilatation. Scattered colonic diverticulosis. Appendix appears normal. Vascular/Lymphatic: No abdominal aorta or iliac aneurysm. Severe atherosclerotic plaque of the aorta and its branches. No abdominal, pelvic, or inguinal lymphadenopathy. Reproductive: Status post hysterectomy. No adnexal masses. Other: No intraperitoneal free fluid. No intraperitoneal free gas. No organized fluid collection. Musculoskeletal: No abdominal wall hernia or abnormality. No suspicious lytic or blastic osseous lesions. Old left L3 transverse process fracture. Question acute L3 right transverse process fracture. Multilevel degenerative changes of the spine. IMPRESSION: 1. Annular pancreas. No CT findings of bowel obstruction or acute pancreatitis. 2. Question acute L3 right transverse process  fracture. Correlate with point tenderness to palpation. 3. Tiny hiatal hernia. 4. Scattered colonic diverticulosis with no acute diverticulitis. 5. Interval development of a left parapelvic fluid density lesion measuring up to 2.3 cm. Statistically finding likely represents a simple renal cyst. 6.  Aortic Atherosclerosis (ICD10-I70.0). Electronically Signed   By: Iven Finn M.D.   On: 10/29/2020 04:47   DG Chest Portable 1 View  Result Date: 10/28/2020 CLINICAL DATA:  87 year old female with chest pain, back pain and shortness of breath. EXAM: PORTABLE CHEST 1 VIEW COMPARISON:  Chest radiographs 10/24/2020 and earlier. FINDINGS: Portable AP upright view at 0835 hours. Stable lung volumes and mediastinal contours. Streaky left lung base peribronchial opacity has mildly regressed. No pneumothorax, pulmonary edema, pleural effusion or other confluent opacity. Visualized tracheal air column is within normal limits. No acute osseous abnormality identified. IMPRESSION: Streaky left lung base opacity has mildly regressed since 10/24/2020. This is nonspecific but might reflect regression of a bronchopneumonia. Query signs/symptoms of infection. Electronically Signed   By: Genevie Ann M.D.   On: 10/28/2020 09:14     CODE STATUS:     Code Status Orders  (From admission, onward)         Start     Ordered   10/28/20 1917  Do not attempt resuscitation (DNR)  Continuous        10/28/20 1916        Code Status History    Date Active Date Inactive Code Status Order ID Comments User Context   10/28/2020 1732 10/28/2020 1916 Full Code [id number]  Ivor Costa, MD Inpatient   Advance Care Planning Activity       TOTAL TIME TAKING CARE OF THIS PATIENT: *35* minutes.    Fritzi Mandes M.D  [facility]    CC: Primary care physician; [place]

## 2020-10-29 NOTE — Discharge Instructions (Signed)
Patient and patient's son to keep log of blood pressure readings at home for PCP to review to make changes in medication

## 2020-10-29 NOTE — Care Management CC44 (Signed)
Condition Code 44 Documentation Completed  Patient Details  Name: Jordan Gordon MRN: [id number] Date of Birth: November 19, 1933   Condition Code 44 given:  Yes Patient signature on Condition Code 44 notice:  Yes Documentation of 2 MD's agreement:  Yes Code 44 added to claim:  Yes    Kristen Bushway A Klyde Banka, LCSW 10/29/2020, 10:30 AM

## 2020-11-07 ENCOUNTER — Ambulatory Visit: Payer: PPO | Admitting: Family Medicine

## 2020-11-11 ENCOUNTER — Other Ambulatory Visit: Payer: Self-pay

## 2020-11-11 ENCOUNTER — Emergency Department
Admission: EM | Admit: 2020-11-11 | Discharge: 2020-11-11 | Disposition: A | Discharge status: Home / Self Care | Payer: PPO | Attending: Emergency Medicine | Admitting: Emergency Medicine

## 2020-11-11 ENCOUNTER — Other Ambulatory Visit: Payer: Self-pay | Admitting: Family Medicine

## 2020-11-11 ENCOUNTER — Emergency Department: Payer: PPO

## 2020-11-11 DIAGNOSIS — R0602 Shortness of breath: Secondary | ICD-10-CM | POA: Insufficient documentation

## 2020-11-11 DIAGNOSIS — K219 Gastro-esophageal reflux disease without esophagitis: Secondary | ICD-10-CM | POA: Diagnosis not present

## 2020-11-11 DIAGNOSIS — R1010 Upper abdominal pain, unspecified: Secondary | ICD-10-CM | POA: Diagnosis not present

## 2020-11-11 DIAGNOSIS — E876 Hypokalemia: Secondary | ICD-10-CM | POA: Insufficient documentation

## 2020-11-11 DIAGNOSIS — R0902 Hypoxemia: Secondary | ICD-10-CM | POA: Diagnosis not present

## 2020-11-11 DIAGNOSIS — Z7982 Long term (current) use of aspirin: Secondary | ICD-10-CM | POA: Insufficient documentation

## 2020-11-11 DIAGNOSIS — F0390 Unspecified dementia without behavioral disturbance: Secondary | ICD-10-CM | POA: Insufficient documentation

## 2020-11-11 DIAGNOSIS — Z96652 Presence of left artificial knee joint: Secondary | ICD-10-CM | POA: Insufficient documentation

## 2020-11-11 DIAGNOSIS — I2510 Atherosclerotic heart disease of native coronary artery without angina pectoris: Secondary | ICD-10-CM | POA: Diagnosis not present

## 2020-11-11 DIAGNOSIS — R0789 Other chest pain: Secondary | ICD-10-CM | POA: Diagnosis not present

## 2020-11-11 DIAGNOSIS — Z79899 Other long term (current) drug therapy: Secondary | ICD-10-CM | POA: Insufficient documentation

## 2020-11-11 DIAGNOSIS — G4700 Insomnia, unspecified: Secondary | ICD-10-CM

## 2020-11-11 DIAGNOSIS — R079 Chest pain, unspecified: Secondary | ICD-10-CM

## 2020-11-11 DIAGNOSIS — I10 Essential (primary) hypertension: Secondary | ICD-10-CM | POA: Diagnosis not present

## 2020-11-11 LAB — CBC
HCT: 39.2 % (ref 36.0–46.0)
Hemoglobin: 13.9 g/dL (ref 12.0–15.0)
MCH: 30.5 pg (ref 26.0–34.0)
MCHC: 35.5 g/dL (ref 30.0–36.0)
MCV: 86 fL (ref 80.0–100.0)
Platelets: 197 10*3/uL (ref 150–400)
RBC: 4.56 MIL/uL (ref 3.87–5.11)
RDW: 12.6 % (ref 11.5–15.5)
WBC: 8.3 10*3/uL (ref 4.0–10.5)
nRBC: 0 % (ref 0.0–0.2)

## 2020-11-11 LAB — BASIC METABOLIC PANEL
Anion gap: 11 (ref 5–15)
BUN: 21 mg/dL (ref 8–23)
CO2: 24 mmol/L (ref 22–32)
Calcium: 8.8 mg/dL — ABNORMAL LOW (ref 8.9–10.3)
Chloride: 107 mmol/L (ref 98–111)
Creatinine, Ser: 0.91 mg/dL (ref 0.44–1.00)
GFR, Estimated: >60 mL/min (ref >60)
Glucose, Bld: 150 mg/dL — ABNORMAL HIGH (ref 70–99)
Potassium: 2.5 mmol/L — CL (ref 3.5–5.1)
Sodium: 142 mmol/L (ref 135–145)

## 2020-11-11 LAB — URINALYSIS, COMPLETE (UACMP) WITH MICROSCOPIC
Bilirubin Urine: NEGATIVE
Glucose, UA: NEGATIVE mg/dL
Hgb urine dipstick: NEGATIVE
Ketones, ur: NEGATIVE mg/dL
Leukocytes,Ua: NEGATIVE
Nitrite: NEGATIVE
Protein, ur: NEGATIVE mg/dL
Specific Gravity, Urine: 1.01 (ref 1.005–1.030)
Squamous Epithelial / HPF: NONE SEEN (ref 0–5)
pH: 8 (ref 5.0–8.0)

## 2020-11-11 LAB — HEPATIC FUNCTION PANEL
ALT: 15 U/L (ref 0–44)
AST: 22 U/L (ref 15–41)
Albumin: 3.9 g/dL (ref 3.5–5.0)
Alkaline Phosphatase: 60 U/L (ref 38–126)
Bilirubin, Direct: 0.1 mg/dL (ref 0.0–0.2)
Indirect Bilirubin: 1 mg/dL — ABNORMAL HIGH (ref 0.3–0.9)
Total Bilirubin: 1.1 mg/dL (ref 0.3–1.2)
Total Protein: 6.6 g/dL (ref 6.5–8.1)

## 2020-11-11 LAB — TROPONIN I (HIGH SENSITIVITY)
Troponin I (High Sensitivity): 13 ng/L (ref <18)
Troponin I (High Sensitivity): 12 ng/L (ref <18)

## 2020-11-11 LAB — LIPASE, BLOOD: Lipase: 30 U/L (ref 11–51)

## 2020-11-11 LAB — MAGNESIUM: Magnesium: 2.1 mg/dL (ref 1.7–2.4)

## 2020-11-11 MED ORDER — POTASSIUM CHLORIDE CRYS ER 20 MEQ PO TBCR
40.0000 meq | EXTENDED_RELEASE_TABLET | Freq: Once | ORAL | Status: AC
  Administered 2020-11-11: 40 meq via ORAL
  Filled 2020-11-11: qty 2

## 2020-11-11 MED ORDER — POTASSIUM CHLORIDE CRYS ER 10 MEQ PO TBCR: 10.0000 meq | EXTENDED_RELEASE_TABLET | Freq: Two times a day (BID) | ORAL | 0 refills | Status: DC

## 2020-11-11 MED ORDER — POTASSIUM CHLORIDE CRYS ER 10 MEQ PO TBCR: 10.0000 meq | EXTENDED_RELEASE_TABLET | Freq: Every day | ORAL | 0 refills | Status: DC

## 2020-11-11 MED ORDER — POTASSIUM CHLORIDE 10 MEQ/100ML IV SOLN
10.0000 meq | INTRAVENOUS | Status: AC
  Administered 2020-11-11 (×2): 10 meq via INTRAVENOUS
  Filled 2020-11-11 (×2): qty 100

## 2020-11-11 NOTE — ED Provider Notes (Signed)
[facility] Emergency Department Provider Note   ____________________________________________   Event Date/Time   First MD Initiated Contact with Patient 11/11/20 [id number]     (approximate)  I have reviewed the triage vital signs and the nursing notes.   HISTORY  Chief Complaint Chest Pain    HPI Jordan Gordon is a 87 y.o. female with past medical history of hypertension, hyperlipidemia, CAD, GERD, depression, and dementia who presents to the ED for chest pain.  Patient states that she developed tightness in her chest earlier this morning around 3 AM.  She describes associated discomfort in her upper abdomen along with difficulty catching her breath.  She now states that pain in her chest has improved and her shortness of breath has resolved, abdominal pain is currently mild.  She states "this is nothing new" and that she has been dealing with this for at least the past month.  She does admit to significant stress lately as she is due to attended a funeral for a family member later today.  Blood pressure noted to be elevated with EMS and patient was given nitro spray x1.  She states she has been compliant with her antihypertensives and took her morning medications already today.  She denies any fevers, cough, nausea, vomiting, diarrhea, dysuria, or hematuria.        Past Medical History:  Diagnosis Date  . Anxiety   . Coronary artery disease   . GERD (gastroesophageal reflux disease)   . HOH (hard of hearing)   . Hyperlipidemia   . Hypertension   . Major depressive disorder     Patient Active Problem List   Diagnosis Date Noted  . Hypertensive urgency 10/28/2020  . HTN (hypertension) 10/28/2020  . GERD (gastroesophageal reflux disease) 10/28/2020  . Depression with anxiety 10/28/2020  . Acute respiratory failure with hypoxia ([place]) 10/28/2020  . Hypokalemia 10/28/2020  . CAD (coronary artery disease) 10/28/2020  . Major depressive disorder, recurrent  episode, moderate ([place]) 01/13/2019  . Mechanical and motor problems with internal organs 04/18/2015  . Abnormal foot pulse 04/18/2015  . Clinical depression 04/18/2015  . Fatigue 04/18/2015  . Blood glucose elevated 04/18/2015  . Decreased potassium in the blood 04/18/2015  . Low back pain 04/18/2015  . Paresthesia of skin 04/18/2015  . Gastro-esophageal reflux disease with esophagitis 04/18/2015  . Anxiety 01/07/2015  . Chest pain 09/20/2014  . History of cardiac catheterization 03/08/2014  . Disorder resulting from impaired renal function 07/29/2009  . Muscle ache 03/15/2009  . SOB (shortness of breath) on exertion 09/26/2008  . Adaptation reaction 07/21/2006  . Osteoarthritis 07/21/2006  . 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 Essential (primary) hypertension 04/16/1998  . Allergic rhinitis 03/12/1998  . Insomnia 03/04/1998  . Menopausal and perimenopausal disorder 02/02/1997  . HLD (hyperlipidemia) 12/06/1991    Past Surgical History:  Procedure Laterality Date  . ABDOMINAL HYSTERECTOMY  1979  . BREAST EXCISIONAL BIOPSY Left 2005   benign  . CATARACT EXTRACTION W/PHACO Left 02/08/2018   Procedure: CATARACT EXTRACTION PHACO AND INTRAOCULAR LENS PLACEMENT (IOC);  Surgeon: Birder Robson, MD;  Location: [facility] ORS;  Service: Ophthalmology;  Laterality: Left;  [place] 00:33.4 AP% 13.9 CDE 4.66 Fluid Pack lot # [id number]  . COLONOSCOPY WITH PROPOFOL N/A 07/22/2015   Procedure: COLONOSCOPY WITH PROPOFOL;  Surgeon: Manya Silvas, MD;  Location: [facility] ENDOSCOPY;  Service: Endoscopy;  Laterality: N/A;  . COLONOSCOPY WITH PROPOFOL N/A 10/19/2016   Procedure: COLONOSCOPY WITH PROPOFOL;  Surgeon: Vira Agar,  Gavin Pound, MD;  Location: [facility] ENDOSCOPY;  Service: Endoscopy;  Laterality: N/A;  . COLONOSCOPY WITH PROPOFOL N/A 09/18/2020   Procedure: COLONOSCOPY WITH PROPOFOL;  Surgeon: Robert Bellow, MD;  Location: [facility] ENDOSCOPY;  Service: Endoscopy;  Laterality: N/A;  .  ESOPHAGOGASTRODUODENOSCOPY (EGD) WITH PROPOFOL N/A 09/18/2020   Procedure: ESOPHAGOGASTRODUODENOSCOPY (EGD) WITH PROPOFOL;  Surgeon: Robert Bellow, MD;  Location: [facility] ENDOSCOPY;  Service: Endoscopy;  Laterality: N/A;  . JOINT REPLACEMENT     TKR  . KNEE SURGERY Left 2011   2007  . TONSILLECTOMY  1940   ADENOIDS    Prior to Admission medications   Medication Sig Start Date End Date Taking? Authorizing Provider  ALPRAZolam Duanne Moron) 0.25 MG tablet TAKE ONE TABLET 3 TIMES DAILY AS NEEDED FOR ANXIETY 09/02/20   Mar Daring, PA-C  aspirin EC 81 MG EC tablet Take 1 tablet (81 mg total) by mouth daily. Swallow whole. 10/30/20   Fritzi Mandes, MD  brimonidine (ALPHAGAN) 0.2 % ophthalmic solution 1 drop 3 (three) times daily. 12/15/19   [provider]  dorzolamide-timolol (COSOPT) 22.3-6.8 MG/ML ophthalmic solution 1 drop 2 (two) times daily. 12/15/19   [provider]  losartan (COZAAR) 100 MG tablet Take 1 tablet (100 mg total) by mouth daily. 04/17/20   Mar Daring, PA-C  mupirocin cream (BACTROBAN) 2 % Apply 1 application topically 2 (two) times daily as needed. 04/17/20   Mar Daring, PA-C  pantoprazole (PROTONIX) 40 MG tablet Take 1 tablet by mouth 2 (two) times daily. Patient not taking: No sig reported 09/02/20   [provider]  potassium chloride (KLOR-CON) 10 MEQ tablet Take 1 tablet (10 mEq total) by mouth daily for 5 days. 11/11/20 11/16/20  Blake Divine, MD  sertraline (ZOLOFT) 50 MG tablet Take 3 tablets (150 mg total) by mouth at bedtime. 10/29/20   Fritzi Mandes, MD  traZODone (DESYREL) 100 MG tablet TAKE ONE TABLET BY MOUTH AT BEDTIME 10/17/20   Virginia Crews, MD  triamterene-hydrochlorothiazide [facility]) 37.5-25 MG tablet TAKE ONE TABLET BY MOUTH EVERY DAY 06/06/20   Mar Daring, PA-C    Allergies Codeine  Family History  Problem Relation Age of Onset  . Cancer Mother   . Heart disease Father   . Cancer Brother    . Breast cancer Neg Hx     Social History Social History   Tobacco Use  . Smoking status: Never Smoker  . Smokeless tobacco: Never Used  Vaping Use  . Vaping Use: Never used  Substance Use Topics  . Alcohol use: Yes    Alcohol/week: 0.0 standard drinks    Comment: wine once every 2 weeks  . Drug use: No    Review of Systems  Constitutional: No fever/chills Eyes: No visual changes. ENT: No sore throat. Cardiovascular: Positive for chest pain. Respiratory: Positive for shortness of breath. Gastrointestinal: Positive for abdominal pain.  No nausea, no vomiting.  No diarrhea.  No constipation. Genitourinary: Negative for dysuria. Musculoskeletal: Negative for back pain. Skin: Negative for rash. Neurological: Negative for headaches, focal weakness or numbness.  ____________________________________________   PHYSICAL EXAM:  VITAL SIGNS: ED Triage Vitals  Enc Vitals Group     BP 11/11/20 0755 (!) 215/89     Pulse Rate 11/11/20 0755 86     Resp 11/11/20 0755 18     Temp 11/11/20 0755 98.1 F (36.7 C)     Temp src --      SpO2 11/11/20 0755 96 %  Weight 11/11/20 0747 163 lb 2.3 oz (74 kg)     Height 11/11/20 0747 5\' 5"  (1.651 m)     Head Circumference --      Peak Flow --      Pain Score 11/11/20 0747 5     Pain Loc --      Pain Edu? --      Excl. in [place]? --     Constitutional: Alert and oriented. Eyes: Conjunctivae are normal. Head: Atraumatic. Nose: No congestion/rhinnorhea. Mouth/Throat: Mucous membranes are moist. Neck: Normal ROM Cardiovascular: Normal rate, regular rhythm. Grossly normal heart sounds.  2+ radial pulses bilaterally. Respiratory: Normal respiratory effort.  No retractions. Lungs CTAB.  No chest wall tenderness to palpation. Gastrointestinal: Soft and nontender. No distention. Genitourinary: deferred Musculoskeletal: No lower extremity tenderness nor edema. Neurologic:  Normal speech and language. No gross focal neurologic deficits  are appreciated. Skin:  Skin is warm, dry and intact. No rash noted. Psychiatric: Mood and affect are normal. Speech and behavior are normal.  ____________________________________________   LABS (all labs ordered are listed, but only abnormal results are displayed)  Labs Reviewed  BASIC METABOLIC PANEL - Abnormal; Notable for the following components:      Result Value   Potassium 2.5 (*)    Glucose, Bld 150 (*)    Calcium 8.8 (*)    All other components within normal limits  URINALYSIS, COMPLETE (UACMP) WITH MICROSCOPIC - Abnormal; Notable for the following components:   Color, Urine STRAW (*)    APPearance HAZY (*)    Bacteria, UA RARE (*)    All other components within normal limits  HEPATIC FUNCTION PANEL - Abnormal; Notable for the following components:   Indirect Bilirubin 1.0 (*)    All other components within normal limits  CBC  LIPASE, BLOOD  MAGNESIUM  TROPONIN I (HIGH SENSITIVITY)  TROPONIN I (HIGH SENSITIVITY)   ____________________________________________  EKG  ED ECG REPORT I, Blake Divine, the attending physician, personally viewed and interpreted this ECG.   Date: 11/11/2020  EKG Time: 7:50  Rate: 85  Rhythm: normal sinus rhythm  Axis: Normal  Intervals:none  ST&T Change: None    PROCEDURES  Procedure(s) performed (including Critical Care):  Procedures   ____________________________________________   INITIAL IMPRESSION / ASSESSMENT AND PLAN / ED COURSE       87 year old female with past medical history of hypertension, hyperlipidemia, CAD, GERD, depression, and dementia who presents to the ED for chest tightness, shortness of breath, abdominal pain starting earlier today.  Some of these complaints do appear to be chronic and patient admits they have been going on for some time.  She was seen in the hospital for similar complaints last month, had a CT scan of her abdomen/pelvis that was performed and negative for acute process.  She was  admitted at that time due to concern for pneumonia and hypertensive urgency, was discovered that she was not taking her medications correctly and BP improved during admission with regular medication administration.  Her complaints appear similar today, EKG shows no evidence of arrhythmia or ischemia and symptoms sound atypical for ACS.  We will check labs including troponin, LFTs, and lipase.  I do not feel repeat CT of her abdomen is indicated given she has no tenderness on exam.  If work-up is unremarkable, I suspect there is a component of anxiety to patient's symptoms, especially given her described stressors.  Labs are remarkable for hypokalemia, but otherwise reassuring.  I doubt hypokalemia is contributing to  her symptoms and 2 sets of troponin are negative, low suspicion for ACS at this time.  Son is now at bedside and does admit patient has had difficulty administering her medications, family is now in process of assisting with medication administration.  Patient was given potassium supplementation here in the ED and is now asymptomatic, appropriate for discharge home with PCP follow-up.  Son was counseled to have patient's potassium rechecked at PCPs office and will prescribe short course of low-dose potassium supplementation.  Patient and son counseled to return to the ED for new worsening symptoms, patient agrees with plan.      ____________________________________________   FINAL CLINICAL IMPRESSION(S) / ED DIAGNOSES  Final diagnoses:  Nonspecific chest pain  Primary hypertension  Hypokalemia     ED Discharge Orders         Ordered    potassium chloride (KLOR-CON) 10 MEQ tablet  2 times daily,   Status:  Discontinued        11/11/20 1309    potassium chloride (KLOR-CON) 10 MEQ tablet  Daily        11/11/20 1309           Note:  This document was prepared using Dragon voice recognition software and [place] include unintentional dictation errors.   Blake Divine,  MD 11/11/20 1311

## 2020-11-11 NOTE — ED Notes (Signed)
MD Charna Archer informed of pt's K+ lab value at this time.

## 2020-11-11 NOTE — ED Notes (Signed)
Pt taken to Xray.

## 2020-11-11 NOTE — Telephone Encounter (Signed)
Requested Prescriptions  Pending Prescriptions Disp Refills  . traZODone (DESYREL) 100 MG tablet [Pharmacy Med Name: TRAZODONE HCL 100 MG TAB] 30 tablet 0    Sig: TAKE ONE TABLET (100 MG) BY MOUTH AT BEDTIME     Psychiatry: Antidepressants - Serotonin Modulator Passed - 11/11/2020  3:06 PM      Passed - Completed PHQ-2 or PHQ-9 in the last 360 days      Passed - Valid encounter within last 6 months    Recent Outpatient Visits          2 months ago Chronic idiopathic constipation   [place], Clearnce Sorrel, PA-C   8 months ago Influenza vaccination declined   [facility], [place], [place]   9 months ago Essential hypertension   [place], [place], [place]   1 year ago Depression, major, single episode, mild [facility])   [place], [place], PA-C   1 year ago Depression, recurrent [facility])   [place], Clearnce Sorrel, PA-C      Future Appointments            In 3 days Chrismon, Vickki Muff, PA-C [employer], [place]   In 2 weeks Bacigalupo, Dionne Bucy, MD [facility] [place]

## 2020-11-11 NOTE — ED Triage Notes (Addendum)
Pt comes via EMS from home with c/o CP and HTN. Pt states this just started this am around 3. EMS gave 1 nitro spray at 0719.   BP-203/112 after nitro 162/96  Pt states she has been under a lot of stress recently. Pt states this has been ongoing. Pt states some SOB. Pt states tightness in chest.

## 2020-11-14 ENCOUNTER — Encounter: Payer: Self-pay | Admitting: Family Medicine

## 2020-11-14 ENCOUNTER — Ambulatory Visit (INDEPENDENT_AMBULATORY_CARE_PROVIDER_SITE_OTHER): Payer: PPO | Admitting: Family Medicine

## 2020-11-14 ENCOUNTER — Other Ambulatory Visit: Payer: Self-pay

## 2020-11-14 VITALS — BP 196/93 | HR 95 | Temp 98.8°F | Resp 16 | Wt 155.0 lb

## 2020-11-14 DIAGNOSIS — I160 Hypertensive urgency: Secondary | ICD-10-CM | POA: Diagnosis not present

## 2020-11-14 DIAGNOSIS — R413 Other amnesia: Secondary | ICD-10-CM | POA: Diagnosis not present

## 2020-11-14 DIAGNOSIS — E876 Hypokalemia: Secondary | ICD-10-CM

## 2020-11-14 MED ORDER — AMLODIPINE BESYLATE 2.5 MG PO TABS: 2.5000 mg | ORAL_TABLET | Freq: Every day | ORAL | 0 refills | Status: DC

## 2020-11-14 NOTE — Progress Notes (Signed)
Established patient visit   Patient: Jordan Gordon   DOB: August 29, 1933   87 y.o. Female  MRN: [id number] Visit Date: 11/14/2020  Today's healthcare provider: Vernie Murders, PA-C   Chief Complaint  Patient presents with   Hospitalization Follow-up   Subjective    HPI  Follow up Hospitalization  Patient here today with her son.  Patient's son is also requesting to have FL2 form filled out. He reports patient is having memory problems and is always wanting to go to the hospital. He also reports patient has been confused and has been "making up problems regarding family members." Patient was admitted to [facility] on 10/28/20 and discharged on 10/29/20. She was treated for hypertensive urgency, acute respiratory failure with hypoxia, pneumonia. Treatment for this included labs. She reports satisfactory compliance with treatment. She reports this condition is stayed the same.  Depression screen [facility] 2/9 11/14/2020 02/19/2020 08/17/2019 11/28/2018 05/16/2018  Decreased Interest 3 0 3 3 0  Down, Depressed, Hopeless 3 0 3 1 0  PHQ - 2 Score 6 0 6 4 0  Altered sleeping 3 0 0 0 1  Tired, decreased energy 3 0 3 3 2   Change in appetite 3 0 0 1 0  Feeling bad or failure about yourself  3 0 0 2 0  Trouble concentrating 2 0 0 0 1  Moving slowly or fidgety/restless 3 0 0 1 1  Suicidal thoughts 3 0 0 0 2  PHQ-9 Score 26 0 9 11 7   Difficult doing work/chores Extremely dIfficult Not difficult at all Somewhat difficult Somewhat difficult Not difficult at all  Some recent data might be hidden   GAD 7 : Generalized Anxiety Score 11/14/2020 05/16/2018 04/15/2018  Nervous, Anxious, on Edge 3 1 1   Control/stop worrying 3 1 1   Worry too much - different things 3 1 1   Trouble relaxing 3 2 1   Restless 3 0 0  Easily annoyed or irritable 3 0 1  Afraid - awful might happen 0 1 0  Total GAD 7 Score 18 6 5   Anxiety Difficulty Extremely difficult Somewhat difficult Somewhat difficult    6CIT Screen 11/14/2020  04/13/2017 07/09/2016  What Year? 0 points 0 points 0 points  What month? 0 points 0 points 0 points  What time? 0 points 0 points 0 points  Count back from 20 0 points 0 points 0 points  Months in reverse 4 points 0 points 2 points  Repeat phrase 6 points 6 points 6 points  Total Score 10 6 8      -----------------------------------------------------------------------------------------   Patient Active Problem List   Diagnosis Date Noted   Hypertensive urgency 10/28/2020   HTN (hypertension) 10/28/2020   GERD (gastroesophageal reflux disease) 10/28/2020   Depression with anxiety 10/28/2020   Acute respiratory failure with hypoxia ([place]) 10/28/2020   Hypokalemia 10/28/2020   CAD (coronary artery disease) 10/28/2020   Major depressive disorder, recurrent episode, moderate ([place]) 01/13/2019   Mechanical and motor problems with internal organs 04/18/2015   Abnormal foot pulse 04/18/2015   Clinical depression 04/18/2015   Fatigue 04/18/2015   Blood glucose elevated 04/18/2015   Decreased potassium in the blood 04/18/2015   Low back pain 04/18/2015   Paresthesia of skin 04/18/2015   Gastro-esophageal reflux disease with esophagitis 04/18/2015   Anxiety 01/07/2015   Chest pain 09/20/2014   History of cardiac catheterization 03/08/2014   Disorder resulting from impaired renal function 07/29/2009   Muscle ache 03/15/2009   SOB (shortness  of breath) on exertion 09/26/2008   Adaptation reaction 07/21/2006   Osteoarthritis 07/21/2006   Benign neoplasm of large bowel 03/08/2000   Essential (primary) hypertension 04/16/1998   Allergic rhinitis 03/12/1998   Insomnia 03/04/1998   Menopausal and perimenopausal disorder 02/02/1997   HLD (hyperlipidemia) 12/06/1991   Social History Tobacco Use   Smoking status: Never   Smokeless tobacco: Never  Vaping Use   Vaping Use: Never used  Substance Use Topics   Alcohol use: Yes    Alcohol/week: 0.0 standard drinks    Comment: wine once every  2 weeks   Drug use: No   AllergiesAllergen Reactions   Codeine     nausea       Medications: Outpatient Medications Prior to Visit  Medication Sig   ALPRAZolam (XANAX) 0.25 MG tablet TAKE ONE TABLET 3 TIMES DAILY AS NEEDED FOR ANXIETY   aspirin EC 81 MG EC tablet Take 1 tablet (81 mg total) by mouth daily. Swallow whole.   brimonidine (ALPHAGAN) 0.2 % ophthalmic solution 1 drop 3 (three) times daily.   dorzolamide-timolol (COSOPT) 22.3-6.8 MG/ML ophthalmic solution 1 drop 2 (two) times daily.   losartan (COZAAR) 100 MG tablet Take 1 tablet (100 mg total) by mouth daily.   potassium chloride (KLOR-CON) 10 MEQ tablet Take 1 tablet (10 mEq total) by mouth daily for 5 days.   sertraline (ZOLOFT) 50 MG tablet Take 3 tablets (150 mg total) by mouth at bedtime.   traZODone (DESYREL) 100 MG tablet TAKE ONE TABLET (100 MG) BY MOUTH AT BEDTIME   mupirocin cream (BACTROBAN) 2 % Apply 1 application topically 2 (two) times daily as needed. (Patient not taking: Reported on 11/14/2020)   triamterene-hydrochlorothiazide (MAXZIDE-25) 37.5-25 MG tablet TAKE ONE TABLET BY MOUTH EVERY DAY (Patient not taking: Reported on 11/14/2020)   [DISCONTINUED] pantoprazole (PROTONIX) 40 MG tablet Take 1 tablet by mouth 2 (two) times daily. (Patient not taking: No sig reported)   No facility-administered medications prior to visit.    Review of Systems  Constitutional:  Positive for activity change and fatigue.  Respiratory:  Negative for cough and shortness of breath.   Cardiovascular:  Negative for chest pain, palpitations and leg swelling.  Gastrointestinal:  Positive for nausea. Negative for abdominal pain and vomiting.  Psychiatric/Behavioral:  Positive for agitation, behavioral problems, confusion and decreased concentration. The patient is nervous/anxious.    Last CBC Lab Results  Component Value Date   WBC 8.3 11/11/2020   HGB 13.9 11/11/2020   HCT 39.2 11/11/2020   MCV 86.0 11/11/2020   MCH 30.5  11/11/2020   RDW 12.6 11/11/2020   PLT 197 50/53/9767   Last metabolic panel Lab Results  Component Value Date   GLUCOSE 150 (H) 11/11/2020   NA 142 11/11/2020   K 2.5 (LL) 11/11/2020   CL 107 11/11/2020   CO2 24 11/11/2020   BUN 21 11/11/2020   CREATININE 0.91 11/11/2020   GFRNONAA >60 11/11/2020   GFRAA 57 (L) 04/17/2020   CALCIUM 8.8 (L) 11/11/2020   PROT 6.6 11/11/2020   ALBUMIN 3.9 11/11/2020   LABGLOB 2.4 08/26/2020   AGRATIO 1.9 08/26/2020   BILITOT 1.1 11/11/2020   ALKPHOS 60 11/11/2020   AST 22 11/11/2020   ALT 15 11/11/2020   ANIONGAP 11 11/11/2020       Objective    BP (!) 196/93 (BP Location: Right Arm, Patient Position: Sitting, Cuff Size: Normal) Comment: patient very anxious  Pulse 95   Temp 98.8 F (37.1 C) (Temporal)  Resp 16   Wt 155 lb (70.3 kg)   SpO2 98%   BMI 25.79 kg/m  BP Readings from Last 3 Encounters:  11/14/20 (!) 196/93  11/11/20 (!) 209/95  10/29/20 (!) 171/84   Wt Readings from Last 3 Encounters:  11/14/20 155 lb (70.3 kg)  11/11/20 163 lb 2.3 oz (74 kg)  10/29/20 164 lb 0.4 oz (74.4 kg)       Physical Exam Constitutional:      General: She is not in acute distress.    Appearance: She is well-developed.  HENT:     Head: Normocephalic and atraumatic.     Right Ear: Hearing and tympanic membrane normal.     Left Ear: Hearing and tympanic membrane normal.     Nose: Nose normal.     Mouth/Throat:     Pharynx: Oropharynx is clear.  Eyes:     General: Lids are normal. No scleral icterus.       Right eye: No discharge.        Left eye: No discharge.     Conjunctiva/sclera: Conjunctivae normal.  Cardiovascular:     Rate and Rhythm: Normal rate and regular rhythm.     Pulses: Normal pulses.     Heart sounds: Normal heart sounds.  Pulmonary:     Effort: Pulmonary effort is normal. No respiratory distress.     Breath sounds: Normal breath sounds.  Abdominal:     General: Bowel sounds are normal.     Palpations: Abdomen  is soft.  Musculoskeletal:        General: Normal range of motion.     Cervical back: Normal range of motion and neck supple.  Skin:    Findings: No lesion or rash.  Neurological:     Mental Status: She is alert.     Cranial Nerves: No cranial nerve deficit.     Deep Tendon Reflexes: Reflexes normal.  Psychiatric:        Mood and Affect: Mood is anxious and depressed. Affect is angry.        Speech: Speech normal.        Behavior: Behavior normal.        Thought Content: Thought content normal.        Cognition and Memory: Cognition is impaired. Memory is impaired.      No results found for any visits on 11/14/20.  Assessment & Plan     1. Hypertensive urgency Presently on Losartan 100 mg qd with Maxzide-25 mg qd. Add small dose of Norvasc and check labs. Recent hospitalizations with hypertensive episodes with hypokalemia and normal EKG. - CBC with Differential/Platelet - Comprehensive metabolic panel - amLODipine (NORVASC) 2.5 MG tablet; Take 1 tablet (2.5 mg total) by mouth daily. Add if BP over 180/100 each day.  Dispense: 30 tablet; Refill: 0  2. Hypokalemia Needs follow up labs. Potassium was 2.5 on 11-11-20 and treated with IV then KCL 10 meq qd. - Comprehensive metabolic panel  3. Impaired memory Impaired memory and a little combative at times per family. Suspect dementia. - Ambulatory referral to Neurology   No follow-ups on file.      I, Jaquel Glassburn, PA-C, have reviewed all documentation for this visit. The documentation on 11/14/20 for the exam, diagnosis, procedures, and orders are all accurate and complete.    Vernie Murders, PA-C  [employer] [phone] (phone) [phone] (fax)  [place]

## 2020-11-17 IMAGING — CR CHEST - 2 VIEW
1 series · 2 of 2 positions shown · non-contrast
Comparison: June 02, 2014

CLINICAL DATA: Chest pain.

EXAM:
CHEST - 2 VIEW

[Series 1: w chest pa · 0.14mm/px · 2 of 2 slices shown]
[im 1/2]
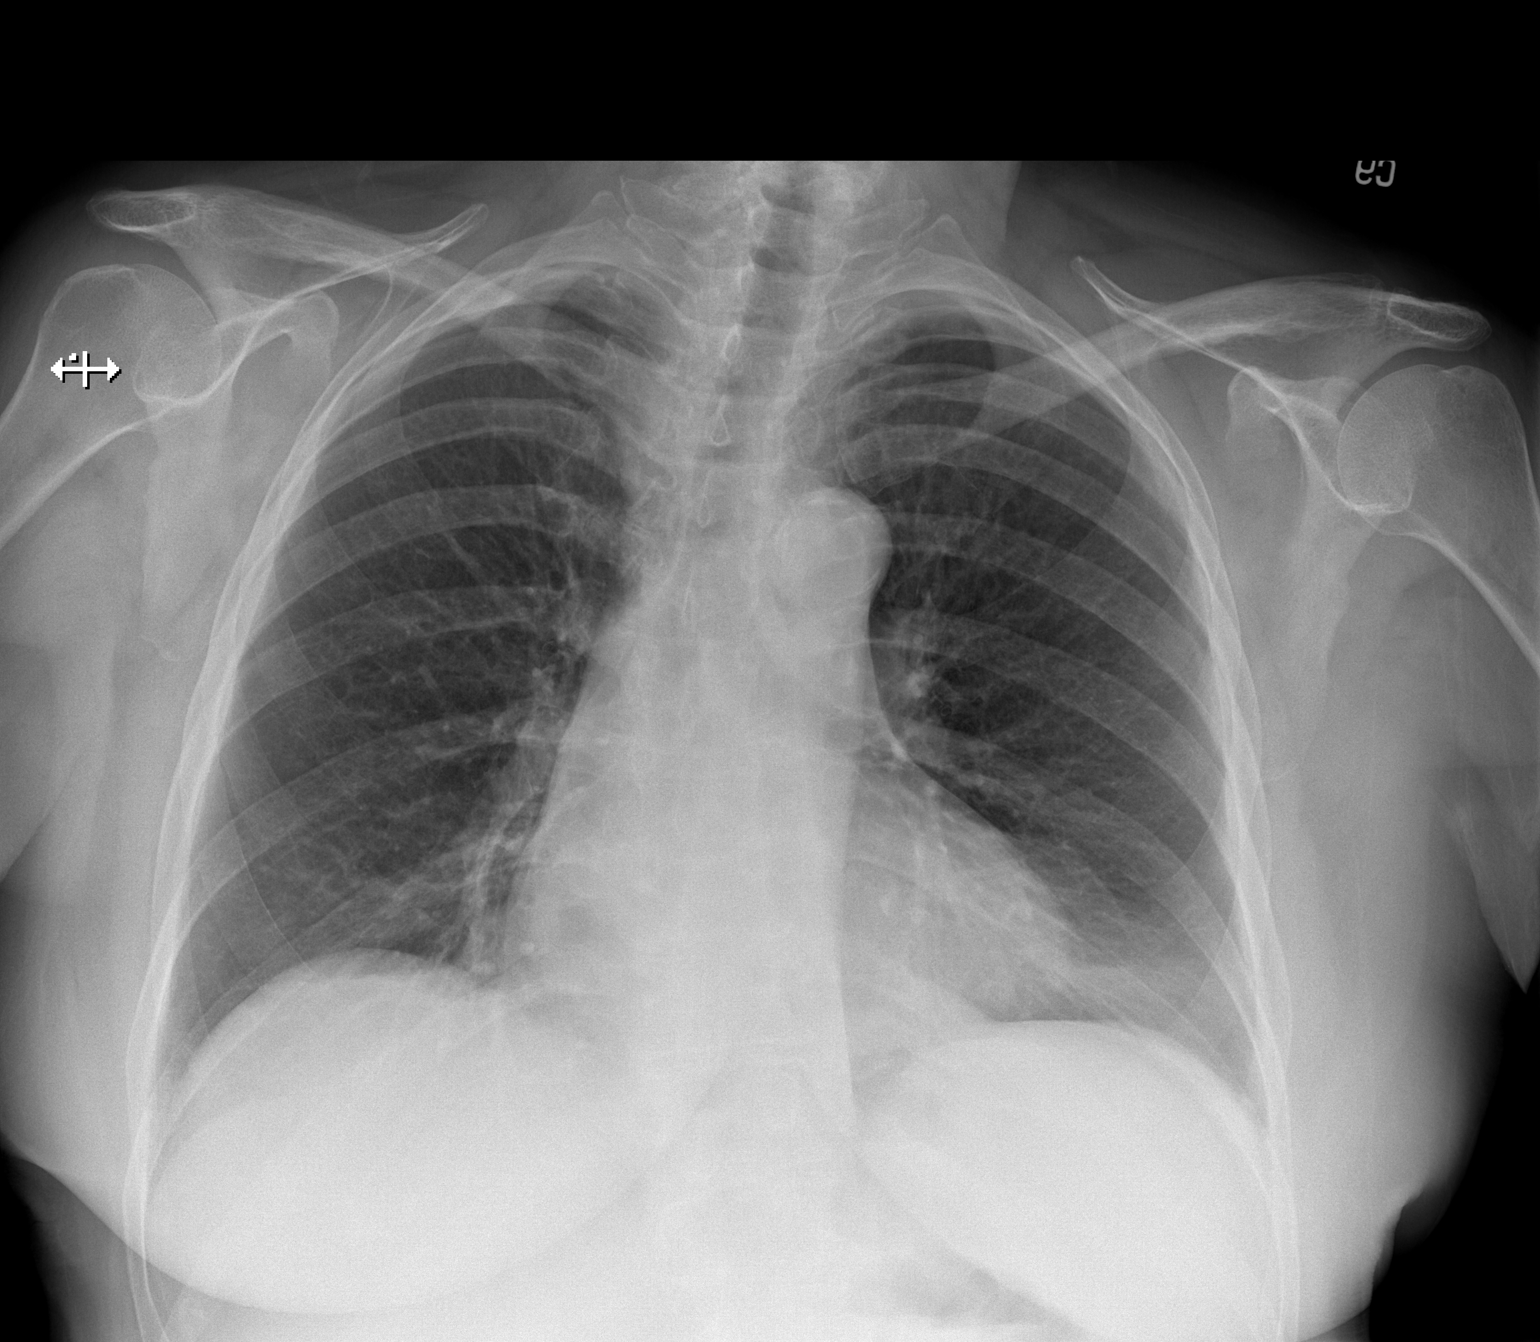
[im 2/2]
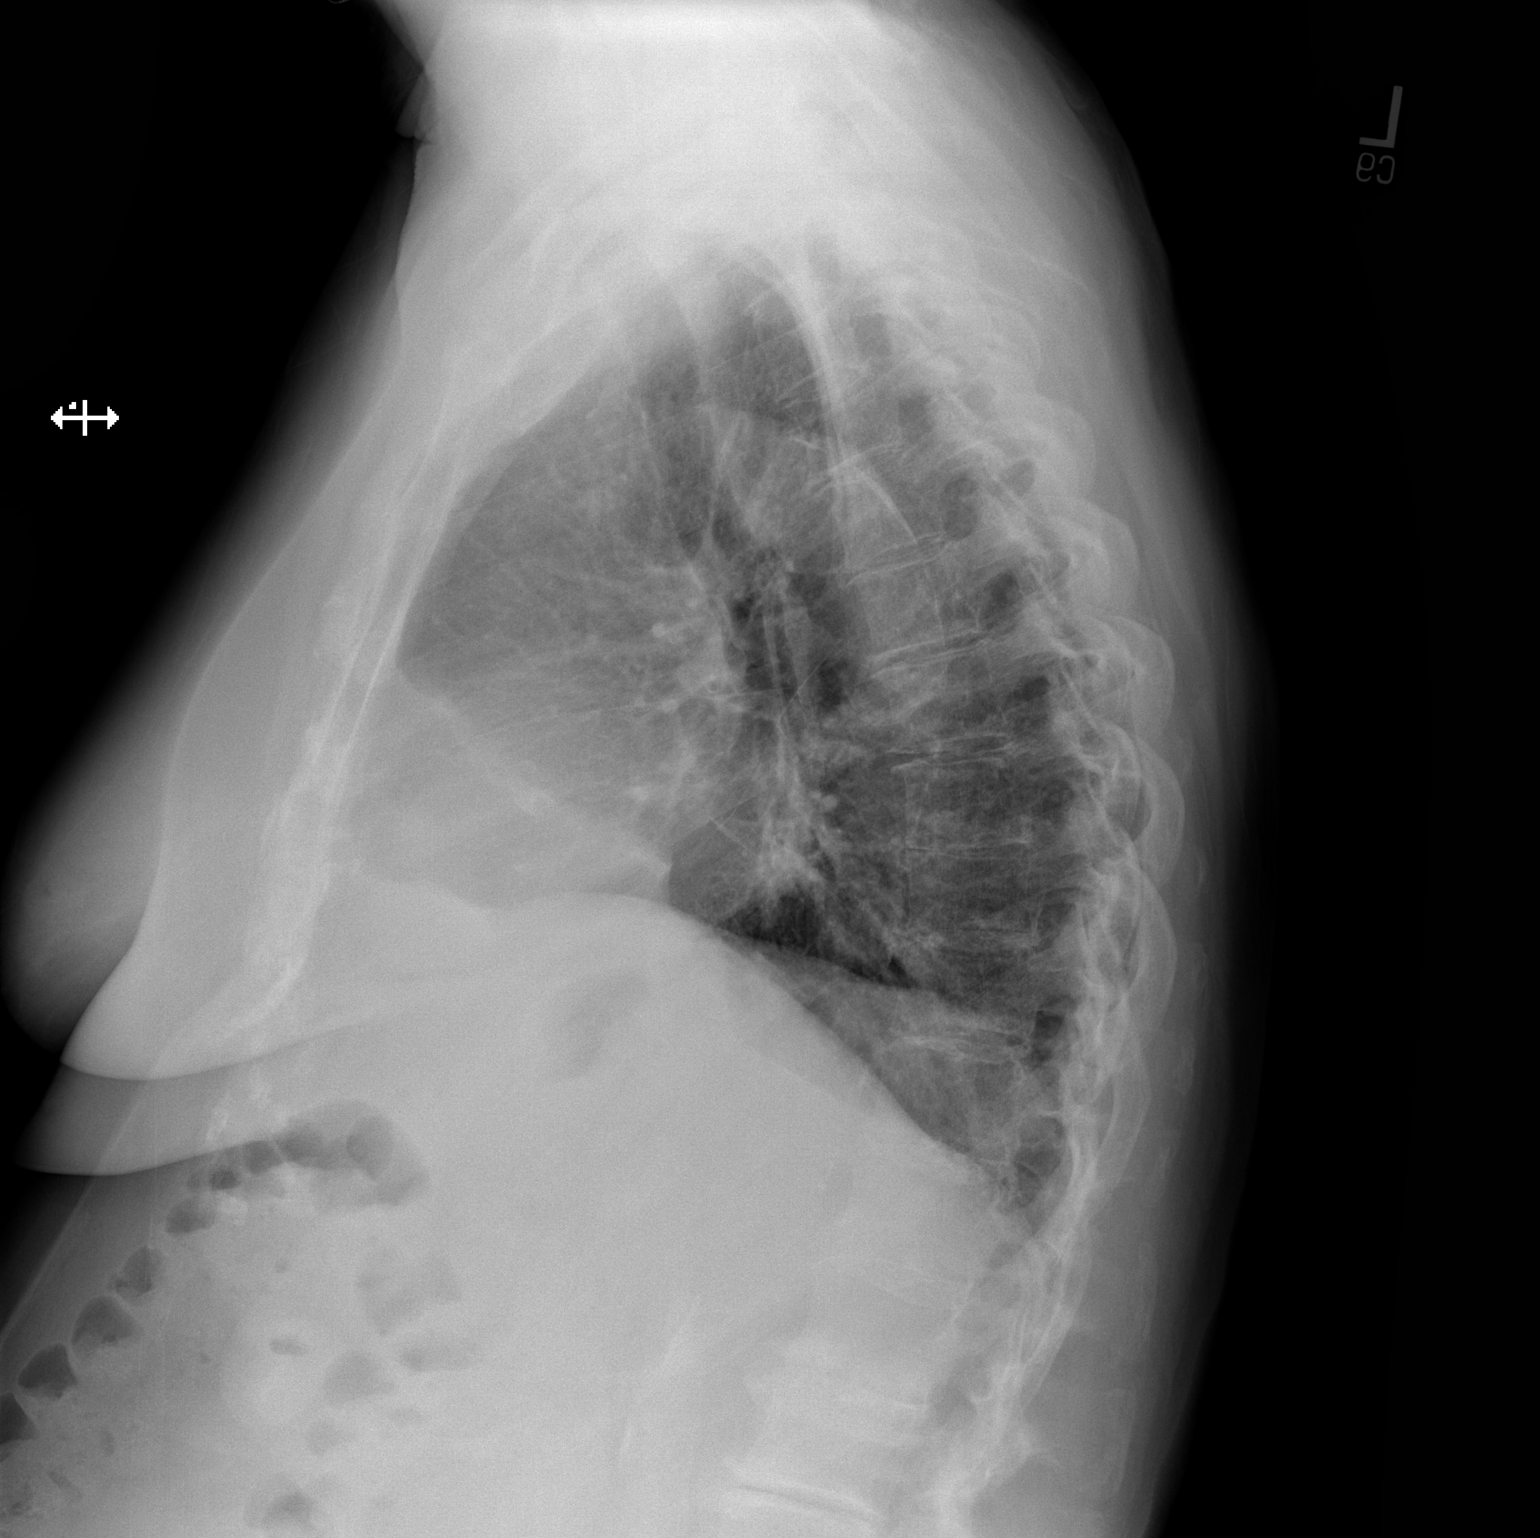

[2 of 2 positions shown; findings below may reference images not displayed]

FINDINGS: Mild atelectasis in the bases. The heart, hila, mediastinum, lungs,
and pleura are otherwise unremarkable.
IMPRESSION: Mild bibasilar atelectasis.  No other acute abnormalities.

## 2020-11-21 ENCOUNTER — Encounter: Payer: Self-pay | Admitting: Family Medicine

## 2020-11-26 ENCOUNTER — Ambulatory Visit (INDEPENDENT_AMBULATORY_CARE_PROVIDER_SITE_OTHER): Payer: PPO | Admitting: Family Medicine

## 2020-11-26 ENCOUNTER — Other Ambulatory Visit: Payer: Self-pay

## 2020-11-26 ENCOUNTER — Encounter: Payer: Self-pay | Admitting: Family Medicine

## 2020-11-26 VITALS — BP 193/95 | HR 90 | Temp 98.6°F | Resp 15 | Wt 156.7 lb

## 2020-11-26 DIAGNOSIS — Z9114 Patient's other noncompliance with medication regimen: Secondary | ICD-10-CM | POA: Diagnosis not present

## 2020-11-26 DIAGNOSIS — E876 Hypokalemia: Secondary | ICD-10-CM

## 2020-11-26 DIAGNOSIS — F331 Major depressive disorder, recurrent, moderate: Secondary | ICD-10-CM | POA: Diagnosis not present

## 2020-11-26 DIAGNOSIS — I10 Essential (primary) hypertension: Secondary | ICD-10-CM

## 2020-11-26 DIAGNOSIS — G4700 Insomnia, unspecified: Secondary | ICD-10-CM

## 2020-11-26 DIAGNOSIS — Z91148 Patient's other noncompliance with medication regimen for other reason: Secondary | ICD-10-CM | POA: Insufficient documentation

## 2020-11-26 MED ORDER — SERTRALINE HCL 100 MG PO TABS: 100.0000 mg | ORAL_TABLET | Freq: Every day | ORAL | 1 refills | Status: DC

## 2020-11-26 MED ORDER — LOSARTAN POTASSIUM 100 MG PO TABS: 100.0000 mg | ORAL_TABLET | Freq: Every day | ORAL | 1 refills | Status: DC

## 2020-11-26 MED ORDER — TRAZODONE HCL 150 MG PO TABS: 150.0000 mg | ORAL_TABLET | Freq: Every evening | ORAL | 1 refills | Status: DC | PRN

## 2020-11-26 MED ORDER — TRIAMTERENE-HCTZ 37.5-25 MG PO TABS: 1.0000 | ORAL_TABLET | Freq: Every day | ORAL | 1 refills | Status: DC

## 2020-11-26 NOTE — Progress Notes (Signed)
Established patient visit   Patient: Jordan Gordon   DOB: 28-May-1934   87 y.o. Female  MRN: [id number] Visit Date: 11/26/2020  Today's healthcare provider: [name], MD   Chief Complaint  Patient presents with   Hypertension   Depression   Anxiety     Subjective    Hypertension Associated symptoms include anxiety. Pertinent negatives include no chest pain, headaches, neck pain, palpitations or shortness of breath.  Depression        Associated symptoms include no fatigue, no myalgias and no headaches.  Past medical history includes anxiety.   Anxiety Patient reports no chest pain, dizziness, nausea, palpitations or shortness of breath.    She is accompanied by her son [name]. He is requesting for a referral for neurology for his mother.   She denies taking potassium. When she was at the ED they provided her potassium and she was sent home with potassium pills. She is taking 100 mg trazodone fot her depression and as a sleeping aide but she reports it's not working. She reports sleeping intermittently throughout the night.   Hypertension, follow-up  BP Readings from Last 3 Encounters:  11/14/20 (!) 196/93  11/11/20 (!) 209/95  10/29/20 (!) 171/84   Wt Readings from Last 3 Encounters:  11/14/20 155 lb (70.3 kg)  11/11/20 163 lb 2.3 oz (74 kg)  10/29/20 164 lb 0.4 oz (74.4 kg)     She was last seen for hypertension 9 months ago.  BP at that visit was 178/85. Management since that visit includes increase Losartan 50 to 100mg , continue Maxzine 37.5-25mg . Her son states that patient takes Amlidipine PRN  She reports excellent compliance with treatment. She is not having side effects.  She is following a Regular diet. She is exercising. She does not smoke. 3 wks ago her son reports she was self-medicating due to her loss in vision. She was unaware of what she was taking. Although in the last 2 wks he has seen improvements.   Use of agents associated with  hypertension: NSAIDS.   Outside blood pressures are patient has not kept record of readings. Symptoms: No chest pain No chest pressure  Yes palpitations No syncope  Yes dyspnea No orthopnea  No paroxysmal nocturnal dyspnea No lower extremity edema   Pertinent labs: Lab Results  Component Value Date   CHOL 191 10/29/2020   HDL 48 10/29/2020   LDLCALC 116 (H) 10/29/2020   TRIG 137 10/29/2020   CHOLHDL 4.0 10/29/2020   Lab Results  Component Value Date   NA 142 11/11/2020   K 2.5 (LL) 11/11/2020   CREATININE 0.91 11/11/2020   GFRNONAA >60 11/11/2020   GFRAA 57 (L) 04/17/2020   GLUCOSE 150 (H) 11/11/2020     The ASCVD Risk score (Goff DC Jr., et al., 2013) failed to calculate for the following reasons:   The 2013 ASCVD risk score is only valid for ages 75 to 65   ---------------------------------------------------------------------------------------------------  Anxiety, Follow-up  She was last seen for anxiety 9 months ago. Changes made at last visit include none continue Wellbutrin XL 150mg  ,Sertraline 50mg  Trazodone 100mg  qhs and Alprazolam 0.25mg  PRN.   She reports excellent compliance with treatment. She reports good tolerance of treatment. She is not having side effects.   She feels her anxiety is mild and Worse since last visit.  Symptoms: No chest pain No difficulty concentrating  No dizziness No fatigue  No feelings of losing control Yes insomnia  Yes irritable  No palpitations  No panic attacks No racing thoughts  No shortness of breath No sweating  No tremors/shakes    GAD-7 Results GAD-7 Generalized Anxiety Disorder Screening Tool 11/14/2020 05/16/2018 04/15/2018  1. Feeling Nervous, Anxious, or on Edge 3 1 1   2. Not Being Able to Stop or Control Worrying 3 1 1   3. Worrying Too Much About Different Things 3 1 1   4. Trouble Relaxing 3 2 1   5. Being So Restless it's Hard To Sit Still 3 0 0  6. Becoming Easily Annoyed or Irritable 3 0 1  7. Feeling Afraid As  If Something Awful Might Happen 0 1 0  Total GAD-7 Score 18 6 5   Difficulty At Work, Home, or Getting  Along With Others? Extremely difficult Somewhat difficult Somewhat difficult    PHQ-9 Scores PHQ9 SCORE ONLY 11/14/2020 02/19/2020 08/17/2019  PHQ-9 Total Score 26 0 9    ---------------------------------------------------------------------------------------------------  Depression, Follow-up  She  was last seen for this 9 months ago. Changes made at last visit include none continue Wellbutrin XL 150mg  ,Sertraline 50mg  Trazodone 100mg  qhs and Alprazolam 0.25mg  PRN.   She reports good compliance with treatment. Patients son states that patient is not on Wellbutrin, he states that he would like to discuss decreasing dose of Sertraline. Patients son states that patient is taking numerous medications and states that she can not see. She is not having side effects.   She reports good tolerance of treatment. Current symptoms include: depressed mood, fatigue, feelings of worthlessness/guilt, impaired memory, and insomnia She feels she is Unchanged since last visit.  Depression screen [facility] 2/9 11/14/2020 02/19/2020 08/17/2019  Decreased Interest 3 0 3  Down, Depressed, Hopeless 3 0 3  PHQ - 2 Score 6 0 6  Altered sleeping 3 0 0  Tired, decreased energy 3 0 3  Change in appetite 3 0 0  Feeling bad or failure about yourself  3 0 0  Trouble concentrating 2 0 0  Moving slowly or fidgety/restless 3 0 0  Suicidal thoughts 3 0 0  PHQ-9 Score 26 0 9  Difficult doing work/chores Extremely dIfficult Not difficult at all Somewhat difficult  Some recent data might be hidden    -----------------------------------------------------------------------------------------   Patient Active Problem List   Diagnosis Date Noted   Hypertensive urgency 10/28/2020   HTN (hypertension) 10/28/2020   GERD (gastroesophageal reflux disease) 10/28/2020   Depression with anxiety 10/28/2020   Acute respiratory failure  with hypoxia ([place]) 10/28/2020   Hypokalemia 10/28/2020   CAD (coronary artery disease) 10/28/2020   Major depressive disorder, recurrent episode, moderate ([place]) 01/13/2019   Mechanical and motor problems with internal organs 04/18/2015   Abnormal foot pulse 04/18/2015   Clinical depression 04/18/2015   Fatigue 04/18/2015   Blood glucose elevated 04/18/2015   Decreased potassium in the blood 04/18/2015   Low back pain 04/18/2015   Paresthesia of skin 04/18/2015   Gastro-esophageal reflux disease with esophagitis 04/18/2015   Anxiety 01/07/2015   Chest pain 09/20/2014   History of cardiac catheterization 03/08/2014   Disorder resulting from impaired renal function 07/29/2009   Muscle ache 03/15/2009   SOB (shortness of breath) on exertion 09/26/2008   Adaptation reaction 07/21/2006   Osteoarthritis 07/21/2006   Benign neoplasm of large bowel 03/08/2000   Essential (primary) hypertension 04/16/1998   Allergic rhinitis 03/12/1998   Insomnia 03/04/1998   Menopausal and perimenopausal disorder 02/02/1997   HLD (hyperlipidemia) 12/06/1991   Past Medical History:  Diagnosis Date   Anxiety  Coronary artery disease    GERD (gastroesophageal reflux disease)    HOH (hard of hearing)    Hyperlipidemia    Hypertension    Major depressive disorder    Allergies  Allergen Reactions   Codeine     nausea       Medications: Outpatient Medications Prior to Visit  Medication Sig   ALPRAZolam (XANAX) 0.25 MG tablet TAKE ONE TABLET 3 TIMES DAILY AS NEEDED FOR ANXIETY   amLODipine (NORVASC) 2.5 MG tablet Take 1 tablet (2.5 mg total) by mouth daily. Add if BP over 180/100 each day.   aspirin EC 81 MG EC tablet Take 1 tablet (81 mg total) by mouth daily. Swallow whole.   brimonidine (ALPHAGAN) 0.2 % ophthalmic solution 1 drop 3 (three) times daily.   dorzolamide-timolol (COSOPT) 22.3-6.8 MG/ML ophthalmic solution 1 drop 2 (two) times daily.   losartan (COZAAR) 100 MG tablet Take 1 tablet  (100 mg total) by mouth daily.   mupirocin cream (BACTROBAN) 2 % Apply 1 application topically 2 (two) times daily as needed. (Patient not taking: Reported on 11/14/2020)   potassium chloride (KLOR-CON) 10 MEQ tablet Take 1 tablet (10 mEq total) by mouth daily for 5 days.   sertraline (ZOLOFT) 50 MG tablet Take 3 tablets (150 mg total) by mouth at bedtime.   traZODone (DESYREL) 100 MG tablet TAKE ONE TABLET (100 MG) BY MOUTH AT BEDTIME   triamterene-hydrochlorothiazide (MAXZIDE-25) 37.5-25 MG tablet TAKE ONE TABLET BY MOUTH EVERY DAY (Patient not taking: Reported on 11/14/2020)   No facility-administered medications prior to visit.    Review of Systems  Constitutional:  Negative for chills, fatigue and fever.  HENT:  Negative for ear pain, nosebleeds, rhinorrhea, sinus pressure, sinus pain and sore throat.   Eyes:  Negative for pain.  Respiratory:  Negative for apnea, cough, chest tightness, shortness of breath and wheezing.   Cardiovascular:  Negative for chest pain, palpitations and leg swelling.  Gastrointestinal:  Negative for abdominal pain, blood in stool, constipation, diarrhea, nausea and vomiting.  Genitourinary:  Negative for dysuria, flank pain, frequency, pelvic pain and urgency.  Musculoskeletal:  Negative for back pain, myalgias and neck pain.  Neurological:  Negative for dizziness, seizures, weakness, light-headedness, numbness and headaches.  Psychiatric/Behavioral:  Positive for depression.       Objective    Blood pressure (!) 193/95, pulse 90, temperature 98.6 F (37 C), temperature source Oral, resp. rate 15, weight 156 lb 11.2 oz (71.1 kg), SpO2 94 %.     Physical Exam Vitals reviewed.  Constitutional:      General: She is not in acute distress.    Appearance: Normal appearance. She is well-developed. She is not diaphoretic.  HENT:     Head: Normocephalic and atraumatic.  Eyes:     General: No scleral icterus.    Conjunctiva/sclera: Conjunctivae normal.  Neck:      Thyroid: No thyromegaly.  Cardiovascular:     Rate and Rhythm: Normal rate and regular rhythm.     Pulses: Normal pulses.     Heart sounds: Normal heart sounds. No murmur heard. Pulmonary:     Effort: Pulmonary effort is normal. No respiratory distress.     Breath sounds: Normal breath sounds. No wheezing, rhonchi or rales.  Musculoskeletal:     Cervical back: Neck supple.     Right lower leg: No edema.     Left lower leg: No edema.  Lymphadenopathy:     Cervical: No cervical adenopathy.  Skin:  General: Skin is warm and dry.     Findings: No rash.  Neurological:     Mental Status: She is alert and oriented to person, place, and time. Mental status is at baseline.  Psychiatric:        Mood and Affect: Mood normal.        Behavior: Behavior normal.      No results found for any visits on 11/26/20.  Assessment & Plan      Problem List Items Addressed This Visit       Cardiovascular and Mediastinum   Essential (primary) hypertension - Primary    Uncontrolled Patient is not currently taking her meds as Rx'd - will resume maxzide and increase losartan to 100mg  daily as Rx'd Hold amlodipine pending change in BP with these changes Recheck metabolic panel F/u in 2-4 weeks Monitor home BPs       Relevant Medications   losartan (COZAAR) 100 MG tablet   triamterene-hydrochlorothiazide (MAXZIDE-25) 37.5-25 MG tablet   Other Relevant Orders   AMB Referral to [facility]   Basic Metabolic Panel (BMET)     Other   Insomnia    Increase trazodoen to 150mg  daily       Relevant Medications   traZODone (DESYREL) 150 MG tablet   Major depressive disorder, recurrent episode, moderate (HCC)    Chronic and well controlled Recommend change in zoloft dose to minimize pill burden (100mg  daily)       Relevant Medications   sertraline (ZOLOFT) 100 MG tablet   traZODone (DESYREL) 150 MG tablet   Hypokalemia    Not currently taking K supplement Recheck and  consider supplement       Relevant Orders   Basic Metabolic Panel (BMET)   Medication non-compliance due to excessive pill burden    Will attempt to minimize pill burden CCM referral       Relevant Orders   AMB Referral to [facility]      Return in about 4 weeks (around 12/24/2020) for chronic disease f/u.       I,Essence Turner,acting as a Education administrator for [name], MD.,have documented all relevant documentation on the behalf of [name], MD,as directed by  [name], MD while in the presence of [name], MD.   I, [name], MD, have reviewed all documentation for this visit. The documentation on 11/26/20 for the exam, diagnosis, procedures, and orders are all accurate and complete.   Owain Eckerman, Dionne Bucy, MD, MPH [facility]

## 2020-11-26 NOTE — Assessment & Plan Note (Signed)
Uncontrolled Patient is not currently taking her meds as Rx'd - will resume maxzide and increase losartan to 100mg  daily as Rx'd Hold amlodipine pending change in BP with these changes Recheck metabolic panel F/u in 2-4 weeks Monitor home BPs

## 2020-11-26 NOTE — Assessment & Plan Note (Signed)
Will attempt to minimize pill burden CCM referral

## 2020-11-26 NOTE — Patient Instructions (Addendum)
Neurology [phone]  Take Zoloft 100mg  daily (1 pill) Take losartan 100mg  daily and Maxzide 1 pill daily  Managing Your Hypertension Hypertension, also called high blood pressure, is when the force of the blood pressing against the walls of the arteries is too strong. Arteries are blood vessels that carry blood from your heart throughout your body. Hypertension forces the heart to work harder to pump blood and [place] cause the arteries tobecome narrow or stiff. Understanding blood pressure readings Your personal target blood pressure Loken vary depending on your medical conditions, your age, and other factors. A blood pressure reading includes a higher number over a lower number. Ideally, your blood pressure should be below 120/80. You should know that: The first, or top, number is called the systolic pressure. It is a measure of the pressure in your arteries as your heart beats. The second, or bottom number, is called the diastolic pressure. It is a measure of the pressure in your arteries as the heart relaxes. Blood pressure is classified into four stages. Based on your blood pressure reading, your health care provider Wardle use the following stages to determine what type of treatment you need, if any. Systolic pressure and diastolicpressure are measured in a unit called mmHg. Normal Systolic pressure: below 149. Diastolic pressure: below 80. Elevated Systolic pressure: 702-637. Diastolic pressure: below 80. Hypertension stage 1 Systolic pressure: 858-850. Diastolic pressure: 27-74. Hypertension stage 2 Systolic pressure: 128 or above. Diastolic pressure: 90 or above. How can this condition affect me? Managing your hypertension is an important responsibility. Over time, hypertension can damage the arteries and decrease blood flow to important parts of the body, including the brain, heart, and kidneys. Having untreated or uncontrolled hypertension can lead to: A heart attack. A stroke. A  weakened blood vessel (aneurysm). Heart failure. Kidney damage. Eye damage. Metabolic syndrome. Memory and concentration problems. Vascular dementia. What actions can I take to manage this condition? Hypertension can be managed by making lifestyle changes and possibly by taking medicines. Your health care provider will help you make a plan to bring yourblood pressure within a normal range. Nutrition  Eat a diet that is high in fiber and potassium, and low in salt (sodium), added sugar, and fat. An example eating plan is called the Dietary Approaches to Stop Hypertension (DASH) diet. To eat this way: Eat plenty of fresh fruits and vegetables. Try to fill one-half of your plate at each meal with fruits and vegetables. Eat whole grains, such as whole-wheat pasta, brown rice, or whole-grain bread. Fill about one-fourth of your plate with whole grains. Eat low-fat dairy products. Avoid fatty cuts of meat, processed or cured meats, and poultry with skin. Fill about one-fourth of your plate with lean proteins such as fish, chicken without skin, beans, eggs, and tofu. Avoid pre-made and processed foods. These tend to be higher in sodium, added sugar, and fat. Reduce your daily sodium intake. Most people with hypertension should eat less than 1,500 mg of sodium a day.  Lifestyle  Work with your health care provider to maintain a healthy body weight or to lose weight. Ask what an ideal weight is for you. Get at least 30 minutes of exercise that causes your heart to beat faster (aerobic exercise) most days of the week. Activities Verrill include walking, swimming, or biking. Include exercise to strengthen your muscles (resistance exercise), such as weight lifting, as part of your weekly exercise routine. Try to do these types of exercises for 30 minutes at least  3 days a week. Do not use any products that contain nicotine or tobacco, such as cigarettes, e-cigarettes, and chewing tobacco. If you need help  quitting, ask your health care provider. Control any long-term (chronic) conditions you have, such as high cholesterol or diabetes. Identify your sources of stress and find ways to manage stress. This Mikkelsen include meditation, deep breathing, or making time for fun activities.  Alcohol use Do not drink alcohol if: Your health care provider tells you not to drink. You are pregnant, Berling be pregnant, or are planning to become pregnant. If you drink alcohol: Limit how much you use to: 0-1 drink a day for women. 0-2 drinks a day for men. Be aware of how much alcohol is in your drink. In the [place] one drink equals one 12 oz bottle of beer (355 mL), one 5 oz glass of wine (148 mL), or one 1 oz glass of hard liquor (44 mL). Medicines Your health care provider Stowell prescribe medicine if lifestyle changes are not enough to get your blood pressure under control and if: Your systolic blood pressure is 130 or higher. Your diastolic blood pressure is 80 or higher. Take medicines only as told by your health care provider. Follow the directions carefully. Blood pressure medicines must be taken as told by your health care provider. The medicine does not work as well when you skip doses. Skippingdoses also puts you at risk for problems. Monitoring Before you monitor your blood pressure: Do not smoke, drink caffeinated beverages, or exercise within 30 minutes before taking a measurement. Use the bathroom and empty your bladder (urinate). Sit quietly for at least 5 minutes before taking measurements. Monitor your blood pressure at home as told by your health care provider. To do this: Sit with your back straight and supported. Place your feet flat on the floor. Do not cross your legs. Support your arm on a flat surface, such as a table. Make sure your upper arm is at heart level. Each time you measure, take two or three readings one minute apart and record the results. You Dubie also need to have your blood  pressure checked regularly by your healthcare provider. General information Talk with your health care provider about your diet, exercise habits, and other lifestyle factors that Javed be contributing to hypertension. Review all the medicines you take with your health care provider because there Scheibe be side effects or interactions. Keep all visits as told by your health care provider. Your health care provider can help you create and adjust your plan for managing your high blood pressure. Where to find more information National Heart, Lung, and Blood Institute: https://wilson-eaton.com/ American Heart Association: www.heart.org Contact a health care provider if: You think you are having a reaction to medicines you have taken. You have repeated (recurrent) headaches. You feel dizzy. You have swelling in your ankles. You have trouble with your vision. Get help right away if: You develop a severe headache or confusion. You have unusual weakness or numbness, or you feel faint. You have severe pain in your chest or abdomen. You vomit repeatedly. You have trouble breathing. These symptoms Dohner represent a serious problem that is an emergency. Do not wait to see if the symptoms will go away. Get medical help right away. Call your local emergency services (911 in the [place] Do not drive yourself to the hospital. Summary Hypertension is when the force of blood pumping through your arteries is too strong. If this condition is not controlled, it  Breed put you at risk for serious complications. Your personal target blood pressure Dreibelbis vary depending on your medical conditions, your age, and other factors. For most people, a normal blood pressure is less than 120/80. Hypertension is managed by lifestyle changes, medicines, or both. Lifestyle changes to help manage hypertension include losing weight, eating a healthy, low-sodium diet, exercising more, stopping smoking, and limiting alcohol. This information is not  intended to replace advice given to you by your health care provider. Make sure you discuss any questions you have with your healthcare provider. Document Revised: 06/30/2019 Document Reviewed: 04/25/2019 Elsevier Patient Education  2022 [place], Adult After being diagnosed with an anxiety disorder, you Callender be relieved to know why you have felt or behaved a certain way. You Kleine also feel overwhelmed about the treatment ahead and what it will mean for your life. With care and support, youcan manage this condition and recover from it. How to manage lifestyle changes Managing stress and anxiety  Stress is your body's reaction to life changes and events, both good and bad. Most stress will last just a few hours, but stress can be ongoing and can lead to more than just stress. Although stress can play a major role in anxiety, it is not the same as anxiety. Stress is usually caused by something external, such as a deadline, test, or competition. Stress normally passes after thetriggering event has ended.  Anxiety is caused by something internal, such as imagining a terrible outcome or worrying that something will go wrong that will devastate you. Anxiety often does not go away even after the triggering event is over, and it can become long-term (chronic) worry. It is important to understand the differences between stress and anxiety and to manage your stress effectively so that it does not lead to ananxious response. Talk with your health care provider or a counselor to learn more about reducing anxiety and stress. He or she Willig suggest tension reduction techniques, such as: Music therapy. This can include creating or listening to music that you enjoy and that inspires you. Mindfulness-based meditation. This involves being aware of your normal breaths while not trying to control your breathing. It can be done while sitting or walking. Centering prayer. This involves focusing on a  word, phrase, or sacred image that means something to you and brings you peace. Deep breathing. To do this, expand your stomach and inhale slowly through your nose. Hold your breath for 3-5 seconds. Then exhale slowly, letting your stomach muscles relax. Self-talk. This involves identifying thought patterns that lead to anxiety reactions and changing those patterns. Muscle relaxation. This involves tensing muscles and then relaxing them. Choose a tension reduction technique that suits your lifestyle and personality. These techniques take time and practice. Set aside 5-15 minutes a day to do them. Therapists can offer counseling and training in these techniques. The training to help with anxiety Jobe be covered by some insurance plans. Other things you can do to manage stress and anxiety include: Keeping a stress/anxiety diary. This can help you learn what triggers your reaction and then learn ways to manage your response. Thinking about how you react to certain situations. You Fan not be able to control everything, but you can control your response. Making time for activities that help you relax and not feeling guilty about spending your time in this way. Visual imagery and yoga can help you stay calm and relax.  Medicines Medicines can help ease symptoms.  Medicines for anxiety include: Anti-anxiety drugs. Antidepressants. Medicines are often used as a primary treatment for anxiety disorder. Medicines will be prescribed by a health care provider. When used together, medicines, psychotherapy, and tension reduction techniques Bunting be the most effectivetreatment. Relationships Relationships can play a big part in helping you recover. Try to spend more time connecting with trusted friends and family members. Consider going to couples counseling, taking family education classes, or going to familytherapy. Therapy can help you and others better understand your condition. How to recognize changes in your  anxiety Everyone responds differently to treatment for anxiety. Recovery from anxiety happens when symptoms decrease and stop interfering with your daily activities at home or work. This Splinter mean that you will start to: Have better concentration and focus. Worry will interfere less in your daily thinking. Sleep better. Be less irritable. Have more energy. Have improved memory. It is important to recognize when your condition is getting worse. Contact your health care provider if your symptoms interfere with home or work and you feellike your condition is not improving. Follow these instructions at home: Activity Exercise. Most adults should do the following: Exercise for at least 150 minutes each week. The exercise should increase your heart rate and make you sweat (moderate-intensity exercise). Strengthening exercises at least twice a week. Get the right amount and quality of sleep. Most adults need 7-9 hours of sleep each night. Lifestyle  Eat a healthy diet that includes plenty of vegetables, fruits, whole grains, low-fat dairy products, and lean protein. Do not eat a lot of foods that are high in solid fats, added sugars, or salt. Make choices that simplify your life. Do not use any products that contain nicotine or tobacco, such as cigarettes, e-cigarettes, and chewing tobacco. If you need help quitting, ask your health care provider. Avoid caffeine, alcohol, and certain over-the-counter cold medicines. These Mick make you feel worse. Ask your pharmacist which medicines to avoid.  General instructions Take over-the-counter and prescription medicines only as told by your health care provider. Keep all follow-up visits as told by your health care provider. This is important. Where to find support You can get help and support from these sources: Self-help groups. Online and [employer]. A trusted spiritual leader. Couples counseling. Family education classes. Family  therapy. Where to find more information You Ritter find that joining a support group helps you deal with your anxiety. The following sources can help you locate counselors or support groups near you: [place]: www.mentalhealthamerica.net Anxiety and Depression Association of [place] (ADAA): https://www.clark.net/ National Alliance on Mental Illness (NAMI): www.nami.org Contact a health care provider if you: Have a hard time staying focused or finishing daily tasks. Spend many hours a day feeling worried about everyday life. Become exhausted by worry. Start to have headaches, feel tense, or have nausea. Urinate more than normal. Have diarrhea. Get help right away if you have: A racing heart and shortness of breath. Thoughts of hurting yourself or others. If you ever feel like you Harrower hurt yourself or others, or have thoughts about taking your own life, get help right away. You can go to your nearest emergency department or call: Your local emergency services (911 in the [place] A suicide crisis helpline, such as the [place] at 469 802 8520. This is open 24 hours a day. Summary Taking steps to learn and use tension reduction techniques can help calm you and help prevent triggering an anxiety reaction. When used together, medicines, psychotherapy, and tension  reduction techniques Lengyel be the most effective treatment. Family, friends, and partners can play a big part in helping you recover from an anxiety disorder. This information is not intended to replace advice given to you by your health care provider. Make sure you discuss any questions you have with your healthcare provider. Document Revised: 10/25/2018 Document Reviewed: 10/25/2018 Elsevier Patient Education  [place].  http://APA.org/depression-guideline"> https://clinicalkey.com"> http://point-of-care.elsevierperformancemanager.com/skills/"> http://point-of-care.elsevierperformancemanager.com">     Managing Depression, Adult Depression is a mental health condition that affects your thoughts, feelings, and actions. Being diagnosed with depression can bring you relief if you did not know why you have felt or behaved a certain way. It could also leave you feeling overwhelmed with uncertainty about your future. Preparing yourself tomanage your symptoms can help you feel more positive about your future. How to manage lifestyle changes Managing stress  Stress is your body's reaction to life changes and events, both good and bad. Stress can add to your feelings of depression. Learning to manage your stresscan help lessen your feelings of depression. Try some of the following approaches to reducing your stress (stress reduction techniques): Listen to music that you enjoy and that inspires you. Try using a meditation app or take a meditation class. Develop a practice that helps you connect with your spiritual self. Walk in nature, pray, or go to a place of worship. Do some deep breathing. To do this, inhale slowly through your nose. Pause at the top of your inhale for a few seconds and then exhale slowly, letting your muscles relax. Practice yoga to help relax and work your muscles. Choose a stress reduction technique that suits your lifestyle and personality. These techniques take time and practice to develop. Set aside 5-15 minutes a day to do them. Therapists can offer training in these techniques. Other things you can do to manage stress include: Keeping a stress diary. Knowing your limits and saying no when you think something is too much. Paying attention to how you react to certain situations. You Bedoy not be able to control everything, but you can change your reaction. Adding humor to your life by watching funny films or TV shows. Making time for activities that you enjoy and that relax you.  Medicines Medicines, such as antidepressants, are often a part of treatment for  depression. Talk with your pharmacist or health care provider about all the medicines, supplements, and herbal products that you take, their possible side effects, and what medicines and other products are safe to take together. Make sure to report any side effects you Herbold have to your health care provider. Relationships Your health care provider Allers suggest family therapy, couples therapy, orindividual therapy as part of your treatment. How to recognize changes Everyone responds differently to treatment for depression. As you recover from depression, you Coker start to: Have more interest in doing activities. Feel less hopeless. Have more energy. Overeat less often, or have a better appetite. Have better mental focus. It is important to recognize if your depression is not getting better or is getting worse. The symptoms you had in the beginning Gearin return, such as: Tiredness (fatigue) or low energy. Eating too much or too little. Sleeping too much or too little. Feeling restless, agitated, or hopeless. Trouble focusing or making decisions. Unexplained physical complaints. Feeling irritable, angry, or aggressive. If you or your family members notice these symptoms coming back, let yourhealth care provider know right away. Follow these instructions at home: Activity  Try to get some form  of exercise each day, such as walking, biking, swimming, or lifting weights. Practice stress reduction techniques. Engage your mind by taking a class or doing some volunteer work.  Lifestyle Get the right amount and quality of sleep. Cut down on using caffeine, tobacco, alcohol, and other potentially harmful substances. Eat a healthy diet that includes plenty of vegetables, fruits, whole grains, low-fat dairy products, and lean protein. Do not eat a lot of foods that are high in solid fats, added sugars, or salt (sodium). General instructions Take over-the-counter and prescription medicines only as told  by your health care provider. Keep all follow-up visits as told by your health care provider. This is important. Where to find support Talking to others  Friends and family members can be sources of support and guidance. Talk to trusted friends or family members about your condition. Explain your symptoms to them, and let them know that you are working with a health care provider to treat your depression. Tell friends and family members how they also can behelpful. Finances Find appropriate mental health providers that fit with your financial situation. Talk with your health care provider about options to get reduced prices on your medicines. Where to find more information You can find support in your area from: Anxiety and Depression Association of America (ADAA): www.adaa.org Mental Health America: www.mentalhealthamerica.net [employer] on Mental Illness: www.nami.org Contact a health care provider if: You stop taking your antidepressant medicines, and you have any of these symptoms: Nausea. Headache. Light-headedness. Chills and body aches. Not being able to sleep (insomnia). You or your friends and family think your depression is getting worse. Get help right away if: You have thoughts of hurting yourself or others. If you ever feel like you Doberstein hurt yourself or others, or have thoughts about taking your own life, get help right away. Go to your nearest emergency department or: Call your local emergency services (911 in the [place] Call a suicide crisis helpline, such as the [facility] at 252-448-9592. This is open 24 hours a day in the [place] Text the Crisis Text Line at (747)559-1886 (in the [place].). Summary If you are diagnosed with depression, preparing yourself to manage your symptoms is a good way to feel positive about your future. Work with your health care provider on a management plan that includes stress reduction techniques, medicines (if  applicable), therapy, and healthy lifestyle habits. Keep talking with your health care provider about how your treatment is working. If you have thoughts about taking your own life, call a suicide crisis helpline or text a crisis text line. This information is not intended to replace advice given to you by your health care provider. Make sure you discuss any questions you have with your healthcare provider. Document Revised: 04/05/2019 Document Reviewed: 04/05/2019 Elsevier Patient Education  2022 [employer].

## 2020-11-26 NOTE — Assessment & Plan Note (Signed)
Chronic and well controlled Recommend change in zoloft dose to minimize pill burden (100mg  daily)

## 2020-11-26 NOTE — Assessment & Plan Note (Signed)
Not currently taking K supplement Recheck and consider supplement

## 2020-11-26 NOTE — Assessment & Plan Note (Signed)
Increase trazodoen to 150mg  daily

## 2020-11-27 LAB — BASIC METABOLIC PANEL
BUN/Creatinine Ratio: 18 (ref 12–28)
BUN: 19 mg/dL (ref 8–27)
CO2: 27 mmol/L (ref 20–29)
Calcium: 9.3 mg/dL (ref 8.7–10.3)
Chloride: 104 mmol/L (ref 96–106)
Creatinine, Ser: 1.05 mg/dL — ABNORMAL HIGH (ref 0.57–1.00)
Glucose: 82 mg/dL (ref 65–99)
Potassium: 3.4 mmol/L — ABNORMAL LOW (ref 3.5–5.2)
Sodium: 146 mmol/L — ABNORMAL HIGH (ref 134–144)
eGFR: 52 mL/min/{1.73_m2} — ABNORMAL LOW (ref >59)

## 2020-11-28 ENCOUNTER — Other Ambulatory Visit: Payer: Self-pay

## 2020-11-28 DIAGNOSIS — E876 Hypokalemia: Secondary | ICD-10-CM

## 2020-11-28 MED ORDER — POTASSIUM CHLORIDE CRYS ER 10 MEQ PO TBCR: 10.0000 meq | EXTENDED_RELEASE_TABLET | Freq: Every day | ORAL | 0 refills | Status: DC

## 2020-11-28 NOTE — Telephone Encounter (Signed)
-----   Message from Virginia Crews, MD sent at 11/28/2020  8:11 AM EDT ----- Potassium is improving. Recommend KDur 10 meQ daily and recheck in 2 weeks. Ok to send in Rx for potassium.

## 2020-11-29 ENCOUNTER — Telehealth: Payer: Self-pay | Admitting: *Deleted

## 2020-11-29 NOTE — Chronic Care Management (AMB) (Signed)
  Chronic Care Management   Outreach Note  11/29/2020 Name: Jordan Gordon MRN: [id number] DOB: 10-28-1933  Jordan Gordon is a 87 y.o. year old female who is a primary care patient of Brita Romp, Dionne Bucy, MD. I reached out to Jordan Gordon by phone today in response to a referral sent by Jordan Gordon, Dionne Bucy, MD     An unsuccessful telephone outreach was attempted today. The patient was referred to the case management team for assistance with care management and care coordination.   Follow Up Plan: The care management team will reach out to the patient again over the next 7 days.  If patient returns call to provider office, please advise to call Jordan Gordon at [phone].  [facility]

## 2020-12-02 NOTE — Chronic Care Management (AMB) (Signed)
  Chronic Care Management   Note  12/02/2020 Name: Jordan Gordon MRN: [id number] DOB: 1933/08/08  Jordan Gordon is a 87 y.o. year old female who is a primary care patient of Brita Romp, Dionne Bucy, MD. I reached out to Jordan Gordon by phone today in response to a referral sent by Ms. Jordan Gordon's PCP, Brita Romp Dionne Bucy, MD.      Jordan Gordon was given information about Chronic Care Management services today including:  CCM service includes personalized support from designated clinical staff supervised by her physician, including individualized plan of care and coordination with other care providers 24/7 contact phone numbers for assistance for urgent and routine care needs. Service will only be billed when office clinical staff spend 20 minutes or more in a month to coordinate care. Only one practitioner Lathon furnish and bill the service in a calendar month. The patient Jordan Gordon CCM services at any time (effective at the end of the month) by phone call to the office staff. The patient will be responsible for cost sharing (co-pay) of up to 20% of the service fee (after annual deductible is met).  Patient agreed to services and verbal consent obtained.   Follow up plan: Telephone appointment with care management team member scheduled for:  RNCM on 12/06/20 and PharmD on 12/20/2020  [facility]

## 2020-12-06 ENCOUNTER — Ambulatory Visit (INDEPENDENT_AMBULATORY_CARE_PROVIDER_SITE_OTHER): Payer: PPO

## 2020-12-06 DIAGNOSIS — I10 Essential (primary) hypertension: Secondary | ICD-10-CM | POA: Diagnosis not present

## 2020-12-09 NOTE — Chronic Care Management (AMB) (Signed)
  Care Management   Follow Up Note    Name: Jordan Gordon MRN: [id number] DOB: 1933/12/14   Primary Care Provider: Virginia Crews, MD Reason for referral : Chronic Care Management   Mrs. Doescher was referred to the case management team for assistance with care management and care coordination. She was scheduled for initial outreach today. Reports doing well but prefers to complete outreach next week. Denies urgent needs. Agreed to call with questions if needed prior to the rescheduled outreach.    Follow Up Plan:  Will follow up as requested on 12/13/20.    C[facility] Care Management [facility] [phone]

## 2020-12-13 ENCOUNTER — Telehealth: Payer: Self-pay

## 2020-12-13 DIAGNOSIS — H401133 Primary open-angle glaucoma, bilateral, severe stage: Secondary | ICD-10-CM | POA: Diagnosis not present

## 2020-12-16 ENCOUNTER — Telehealth: Payer: Self-pay

## 2020-12-16 NOTE — Chronic Care Management (AMB) (Signed)
Chronic Care Management Pharmacy Assistant   Name: Jordan Gordon  MRN: [id number] DOB: 1933-11-09   Conditions to be addressed/monitored: CAD, HTN, HLD, Anxiety, Depression, and Hypertensive urgency, GERD, Osteoarthritis, Insomnia,  Low Back Pain, Hypokalemia,   Primary concerns for visit include: Patient states she can't think of any primary concerns she has at the moment   Recent office visits:  11/26/2020 [name], MD (PCP Office Visit)- for Hypertension, Depression and Anxiety- Per note   Increase trazodoen to 150mg  daily, Patient is not currently taking her meds as Rx'd - will resume maxzide and increase losartan to 100mg  daily as Rx'd Hold amlodipine pending change in BP with these changes F/U in 2-4 weeks. Referral to [facility] Coordination Decreased Sertraline to 100 mg Daily from 150 mg. Stopped Potassium 10 MEG, and Mupirocin Calcium 2% patient reported not taking.    11/14/2020 Vernie Murders, PA-C (PCP Office Visit) for Hospitalization f/u- Started Amlodipine Besylate 2.5 mg if BP over 180/100. Discontinued Pantoprazole Sodium 40 mg due to patient not taking. Referral to Neurology placed   08/26/2020 Fenton Malling, PA-C (PCP Visit) for Constipation- Patient referred to [facility], No medication changes other than Patient reported she purchased OTC Miralax and had not yet started the medication .  08/05/2020 Fenton Malling, PA-C (Telephone Message to PCP)- Patient reported that her Zoloft 100 mg was not working at all and wanted to increase medication. Provider agreed to increase medication to Zolfot 150 mg daily.  Recent consult visits:  10/16/2020 Max T. [place], [place] (Podiatry) for Debridement- Lantanprost 0.005% medication discontinued  09/02/2020 Houston Siren, MD (Gastroenterology)- Patient reported taking Miralax every other day provider increased to daily prn. Endoscopy and Colonoscopy ordered.   Hospital visits:  Medication Reconciliation  was completed by comparing discharge summary, patient's EMR and Pharmacy list, and upon discussion with patient.  Admitted to the hospital on 11/11/2020 due to Chest Pain. Discharge date was 11/11/2020. Discharged from [facility] [place]?Medications Started at [facility] Discharge:?? Started: Klor-CON 10 MEQ daily   Medication Changes at Hospital Discharge: None ID  Medications Discontinued at Hospital Discharge: None ID  Medications that remain the same after Hospital Discharge:??  -All other medications will remain the same.    Medication Reconciliation was completed by comparing discharge summary, patient's EMR and Pharmacy list, and upon discussion with patient.  Admitted to the hospital on 10/28/2020 due to Hypertensive Urgency  Discharge date was 05/242022. Discharged from [place]?Medications Started at [facility] Discharge:?? Aspirin 81 MG EC table take 1 tab daily  Medication Changes at Hospital Discharge: None ID  Medications Discontinued at Hospital Discharge: Ondansetron 4 mg, Potassium Chloride 10 MEQ tablet, [place] 0.2-0.005%  Medications that remain the same after Hospital Discharge:??  -All other medications will remain the same.    Medication Reconciliation was completed by comparing discharge summary, patient's EMR and Pharmacy list, and upon discussion with patient.  Admitted to the hospital on 10/24/2020 due to Abdominal Pain, and Chest Pain. Discharge date was 10/24/2020. Discharged from [facility] [place]?Medications Started at [facility] Discharge:?? None ID  Medication Changes at Hospital Discharge: None ID  Medications Discontinued at Hospital Discharge: None ID  Medications that remain the same after Hospital Discharge:??  -All other medications will remain the same.    Medication Reconciliation was  completed by comparing discharge summary, patient's EMR and Pharmacy list, and upon  discussion with patient.  Admitted to the hospital on 09/18/2020 due to Colonoscopy and Upper Endoscopy. Discharge date was 09/18/2020. Discharged from [place]?Medications Started at [facility] Discharge:?? None ID  Medication Changes at Hospital Discharge: None ID  Medications Discontinued at Hospital Discharge: None ID  Medications that remain the same after Hospital Discharge:??  -All other medications will remain the same.    Medications: Outpatient Encounter Medications as of 12/16/2020  Medication Sig Note   ALPRAZolam (XANAX) 0.25 MG tablet TAKE ONE TABLET 3 TIMES DAILY AS NEEDED FOR ANXIETY    amLODipine (NORVASC) 2.5 MG tablet Take 1 tablet (2.5 mg total) by mouth daily. Add if BP over 180/100 each day. (Patient not taking: Reported on 11/26/2020) 11/26/2020: Patient reports PRN   aspirin EC 81 MG EC tablet Take 1 tablet (81 mg total) by mouth daily. Swallow whole.    brimonidine (ALPHAGAN) 0.2 % ophthalmic solution 1 drop 3 (three) times daily.    dorzolamide-timolol (COSOPT) 22.3-6.8 MG/ML ophthalmic solution 1 drop 2 (two) times daily.    losartan (COZAAR) 100 MG tablet Take 1 tablet (100 mg total) by mouth daily.    potassium chloride (KLOR-CON) 10 MEQ tablet Take 1 tablet (10 mEq total) by mouth daily.    sertraline (ZOLOFT) 100 MG tablet Take 1 tablet (100 mg total) by mouth daily.    traZODone (DESYREL) 150 MG tablet Take 1 tablet (150 mg total) by mouth at bedtime as needed for sleep.    triamterene-hydrochlorothiazide (MAXZIDE-25) 37.5-25 MG tablet Take 1 tablet by mouth daily.    No facility-administered encounter medications on file as of 12/16/2020.   Care Gaps:  Zoster Vaccines- Shingrix, COVID-19 Vaccine   Star Rating Drugs: Losartan 100 mg last filled for a 90-Day supply on 04/17/2020, but Losartan 50 mg tablet was filled for a 90-Day  supply on 10/25/2020  at [place]  Have you seen any other providers since your last visit? **no  Any changes in your medications or health? No about the same no change  Any side effects from any medications? no  Do you have an symptoms or problems not managed by your medications? no  Any concerns about your health right now? no  Has your provider asked that you check blood pressure, blood sugar, or follow special diet at home? Yes patient reports she has a machine and she does check her BP  Do you get any type of exercise on a regular basis? Yes patient reports that she walks everyday   Can you think of a goal you would like to reach for your health? No goals patient states she's just old  Do you have any problems getting your medications? no  Is there anything that you would like to discuss during the appointment? Nothing she can think of.   Please bring medications and supplements to appointment  Lynann Bologna, [place] Pharmacist Assistant Phone: [phone]

## 2020-12-20 ENCOUNTER — Ambulatory Visit: Payer: PPO

## 2020-12-20 NOTE — Progress Notes (Unsigned)
Chronic Care Management Pharmacy Note  12/20/2020 Name:  Jordan Gordon MRN:  [id number] DOB:  [birth year]  Summary: ***  Recommendations/Changes made from today's visit: ***  Plan: ***   Subjective: Jordan Gordon is an 87 y.o. year old female who is a primary patient of Bacigalupo, Jordan Bucy, MD.  The CCM team was consulted for assistance with disease management and care coordination needs.    Engaged with patient by telephone for initial visit in response to provider referral for pharmacy case management and/or care coordination services.   Consent to Services:  The patient was given the following information about Chronic Care Management services today, agreed to services, and gave verbal consent: 1. CCM service includes personalized support from designated clinical staff supervised by the primary care provider, including individualized plan of care and coordination with other care providers 2. 24/7 contact phone numbers for assistance for urgent and routine care needs. 3. Service will only be billed when office clinical staff spend 20 minutes or more in a month to coordinate care. 4. Only one practitioner Rossa furnish and bill the service in a calendar month. 5.The patient Jordan Gordon stop CCM services at any time (effective at the end of the month) by phone call to the office staff. 6. The patient will be responsible for cost sharing (co-pay) of up to 20% of the service fee (after annual deductible is met). Patient agreed to services and consent obtained.  Patient Care Team: Jordan Crews, MD as PCP - General (Family Medicine) Jordan Robson, MD as Referring Physician (Ophthalmology) Jordan Gordon, Jordan Gordon (Pharmacist) Jordan Labella, RN as Case Manager  Recent office visits: 11/26/2020 Jordan Paganini, MD (PCP Office Visit)- for Hypertension, Depression and Anxiety-   11/14/2020 Jordan Murders, PA-C (PCP Office Visit) for Hospitalization f/u- Started Amlodipine Besylate 2.5 mg  if BP over 180/100. Discontinued Pantoprazole Sodium 40 mg due to patient not taking. Referral to Neurology placed  08/26/2020 Jordan Malling, PA-C (PCP Visit) for Constipation- Patient referred to Jordan Gordon, No medication changes other than Patient reported she purchased OTC Miralax and had not yet started the medication .  08/05/2020 Jordan Malling, PA-C (Telephone Message to PCP)- Patient reported that her Zoloft 100 mg was not working at all and wanted to increase medication. Provider agreed to increase medication to Zolfot 150 mg daily.   Recent consult visits: 10/16/2020 Jordan Gordon, [place] (Podiatry) for Debridement- Lantanprost 0.005% medication discontinued   09/02/2020 Jordan Siren, MD (Gastroenterology)- Patient reported taking Miralax every other day provider increased to daily prn. Endoscopy and Colonoscopy ordered.   [facility] visits: Admitted to the [facility] on 11/11/2020 due to Chest Pain. Discharge date was 11/11/2020. Discharged from Jordan Gordon?Medications Started at [facility] Discharge:?? Started: Klor-CON 10 MEQ daily    Medication Changes at [facility] Discharge: None ID   Medications Discontinued at [facility] Discharge: None ID   Medications that remain the same after [facility] Discharge:?? -All other medications will remain the same.     Medication Reconciliation was completed by comparing discharge summary, patient's EMR and Pharmacy list, and upon discussion with patient.   Admitted to the [facility] on 10/28/2020 due to Hypertensive Urgency  Discharge date was 05/242022. Discharged from Jordan Gordon?Medications Started at Jordan Gordon Discharge:?? Aspirin 81 MG EC table take 1 tab daily   Medication Changes at [facility] Discharge: None ID   Medications Discontinued at [facility] Discharge: Ondansetron 4  mg, Potassium Chloride 10 MEQ tablet, Jordan Gordon  0.2-0.005%   Medications that remain the same after Gordon Discharge:?? -All other medications will remain the same.   Objective:  Lab Results  Component Value Date   CREATININE 1.05 (H) 11/26/2020   BUN 19 11/26/2020   GFRNONAA >60 11/11/2020   GFRAA 57 (L) 04/17/2020   NA 146 (H) 11/26/2020   K 3.4 (L) 11/26/2020   CALCIUM 9.3 11/26/2020   CO2 27 11/26/2020   GLUCOSE 82 11/26/2020    Lab Results  Component Value Date/Time   HGBA1C 5.2 10/29/2020 05:01 AM   HGBA1C 5.3 02/21/2019 04:03 PM    Last diabetic Eye exam: No results found for: HMDIABEYEEXA  Last diabetic Foot exam: No results found for: HMDIABFOOTEX   Lab Results  Component Value Date   CHOL 191 10/29/2020   HDL 48 10/29/2020   LDLCALC 116 (H) 10/29/2020   TRIG 137 10/29/2020   CHOLHDL 4.0 10/29/2020    Hepatic Function Latest Ref Rng & Units 11/11/2020 10/28/2020 08/26/2020  Total Protein 6.5 - 8.1 g/dL 6.6 6.8 7.0  Albumin 3.5 - 5.0 g/dL 3.9 3.9 4.6  AST 15 - 41 U/L 22 20 23   ALT 0 - 44 U/L 15 17 23   Alk Phosphatase 38 - 126 U/L 60 64 85  Total Bilirubin 0.3 - 1.2 mg/dL 1.1 0.9 0.4  Bilirubin, Direct 0.0 - 0.2 mg/dL 0.1 - -    Lab Results  Component Value Date/Time   TSH 3.479 12/26/2018 10:58 AM   TSH 2.23 05/05/2017 10:12 AM   TSH 2.880 04/19/2015 08:06 AM    CBC Latest Ref Rng & Units 11/11/2020 10/28/2020 10/24/2020  WBC 4.0 - 10.5 K/uL 8.3 5.1 5.4  Hemoglobin 12.0 - 15.0 g/dL 13.9 13.2 13.9  Hematocrit 36.0 - 46.0 % 39.2 37.0 39.0  Platelets 150 - 400 K/uL 197 175 168    No results found for: VD25OH  Clinical ASCVD: {YES/NO:21197} The ASCVD Risk score Mikey Bussing DC Jr., et al., 2013) failed to calculate for the following reasons:   The 2013 ASCVD risk score is only valid for ages 30 to 56    Depression screen PHQ 2/9 11/26/2020 11/14/2020 02/19/2020  Decreased Interest 2 3 0  Down, Depressed, Hopeless 3 3 0  PHQ - 2 Score 5 6 0  Altered sleeping 2 3 0  Tired, decreased energy 2 3 0  Change  in appetite 0 3 0  Feeling bad or failure about yourself  1 3 0  Trouble concentrating 2 2 0  Moving slowly or fidgety/restless 1 3 0  Suicidal thoughts 0 3 0  PHQ-9 Score 13 26 0  Difficult doing work/chores Extremely dIfficult Extremely dIfficult Not difficult at all  Some recent data might be hidden     ***Other: (CHADS2VASc if Afib, MMRC or CAT for COPD, ACT, DEXA)  Social History   Tobacco Use  Smoking Status Never  Smokeless Tobacco Never   BP Readings from Last 3 Encounters:  11/26/20 (!) 193/95  11/14/20 (!) 196/93  11/11/20 (!) 209/95   Pulse Readings from Last 3 Encounters:  11/26/20 90  11/14/20 95  11/11/20 82   Wt Readings from Last 3 Encounters:  11/26/20 156 lb 11.2 oz (71.1 kg)  11/14/20 155 lb (70.3 kg)  11/11/20 163 lb 2.3 oz (74 kg)   BMI Readings from Last 3 Encounters:  11/26/20 26.08 kg/m  11/14/20 25.79 kg/m  11/11/20 27.15 kg/m    Assessment/Interventions: Review of patient past medical  history, allergies, medications, health status, including review of consultants reports, laboratory and other test data, was performed as part of comprehensive evaluation and provision of chronic care management services.   SDOH:  (Social Determinants of Health) assessments and interventions performed: {yes/no:20286}  SDOH Screenings   Alcohol Screen: Low Risk    Last Alcohol Screening Score (AUDIT): 2  Depression (PHQ2-9): Medium Risk   PHQ-2 Score: 13  Financial Resource Strain: Low Risk    Difficulty of Paying Living Expenses: Not hard at all  Food Insecurity: No Food Insecurity   Worried About Charity fundraiser in the Last Year: Never true   Ran Out of Food in the Last Year: Never true  Housing: Low Risk    Last Housing Risk Score: 0  Physical Activity: Inactive   Days of Exercise per Week: 0 days   Minutes of Exercise per Session: 0 min  Social Connections: Moderately Integrated   Frequency of Communication with Friends and Family: More than  three times a week   Frequency of Social Gatherings with Friends and Family: More than three times a week   Attends Religious Services: More than 4 times per year   Active Member of [employer] or Organizations: No   Attends Archivist Meetings: Never   Marital Status: Married  Stress: No Stress Concern Present   Feeling of Stress : Only a little  Tobacco Use: Low Risk    Smoking Tobacco Use: Never   Smokeless Tobacco Use: Never  Transportation Needs: No Transportation Needs   Lack of Transportation (Medical): No   Lack of Transportation (Non-Medical): No    CCM Care Plan  Allergies  Allergen Reactions   Codeine     nausea    Medications Reviewed Today     Reviewed by Jordan Crews, MD (Physician) on 11/26/20 at [place] List Status: <None>   Medication Order Taking? Sig Documenting Provider Last Dose Status Informant  ALPRAZolam (XANAX) 0.25 MG tablet [id number] Yes TAKE ONE TABLET 3 TIMES DAILY AS NEEDED FOR ANXIETY Mar Daring, PA-C Taking Active Self  amLODipine (NORVASC) 2.5 MG tablet [id number] No Take 1 tablet (2.5 mg total) by mouth daily. Add if BP over 180/100 each day.  Patient not taking: Reported on 11/26/2020   Margo Common, PA-C Not Taking Active            Med Note Minette Headland   Tue Nov 26, 2020  3:25 PM) Patient reports PRN  aspirin EC 81 MG EC tablet [id number] Yes Take 1 tablet (81 mg total) by mouth daily. Swallow whole. Fritzi Mandes, MD Taking Active   brimonidine [facility]) 0.2 % ophthalmic solution [id number] Yes 1 drop 3 (three) times daily. [provider] Taking Active Self  dorzolamide-timolol (COSOPT) 22.3-6.8 MG/ML ophthalmic solution [id number] Yes 1 drop 2 (two) times daily. [provider] Taking Active Self  losartan (COZAAR) 100 MG tablet [id number]  Take 1 tablet (100 mg total) by mouth daily. Jordan Crews, MD  Active   Patient not taking:  Discontinued 11/26/20 1539     Discontinued  11/26/20 1526 (Error)   sertraline (ZOLOFT) 100 MG tablet [id number] Yes Take 1 tablet (100 mg total) by mouth daily. Jordan Crews, MD  Active   Discontinued 11/26/20 1539   traZODone (DESYREL) 150 MG tablet [id number]  Take 1 tablet (150 mg total) by mouth at bedtime as needed for sleep. Jordan Crews, MD  Active   triamterene-hydrochlorothiazide (MAXZIDE-25) 37.5-25 MG  tablet [id number]  Take 1 tablet by mouth daily. Jordan Crews, MD  Active             Patient Active Problem List   Diagnosis Date Noted   Medication non-compliance due to excessive pill burden 11/26/2020   Hypertensive urgency 10/28/2020   GERD (gastroesophageal reflux disease) 10/28/2020   Depression with anxiety 10/28/2020   Acute respiratory failure with hypoxia ([place]) 10/28/2020   Hypokalemia 10/28/2020   CAD (coronary artery disease) 10/28/2020   Major depressive disorder, recurrent episode, moderate ([place]) 01/13/2019   Mechanical and motor problems with internal organs 04/18/2015   Abnormal foot pulse 04/18/2015   Clinical depression 04/18/2015   Fatigue 04/18/2015   Blood glucose elevated 04/18/2015   Decreased potassium in the blood 04/18/2015   Low back pain 04/18/2015   Paresthesia of skin 04/18/2015   Gastro-esophageal reflux disease with esophagitis 04/18/2015   Anxiety 01/07/2015   Chest pain 09/20/2014   History of cardiac catheterization 03/08/2014   Disorder resulting from impaired renal function 07/29/2009   Muscle ache 03/15/2009   SOB (shortness of breath) on exertion 09/26/2008   Adaptation reaction 07/21/2006   Osteoarthritis 07/21/2006   Benign neoplasm of large bowel 03/08/2000   Essential (primary) hypertension 04/16/1998   Allergic rhinitis 03/12/1998   Insomnia 03/04/1998   Menopausal and perimenopausal disorder 02/02/1997   HLD (hyperlipidemia) 12/06/1991    Immunization History  Administered Date(s) Administered   Fluad Quad(high Dose 65+) 02/21/2019    Influenza Split 04/08/2005, 02/20/2013   Influenza, High Dose Seasonal PF 02/27/2014, 03/04/2015, 04/13/2017, 05/16/2018   Influenza-Unspecified 03/18/2016, 03/09/2020   PFIZER(Purple Top)SARS-COV-2 Vaccination 04/17/2020   Pneumococcal Conjugate-13 01/24/2014   Pneumococcal Polysaccharide-23 04/18/2015   Td 03/07/1999   Tdap 03/30/2018   Zoster, Live 03/24/2012    Conditions to be addressed/monitored:  Hypertension, Hyperlipidemia, Coronary Artery Disease, GERD, Depression, Anxiety, Allergic Rhinitis, and Insomnia  There are no care plans that you recently modified to display for this patient.    Medication Assistance: {MEDASSISTANCEINFO:25044}  Compliance/Adherence/Medication fill history: Care Gaps: ***  Star-Rating Drugs: ***  Patient's preferred pharmacy is:  [facility], [place] [address] Phone: [phone] Fax: [phone]  Uses pill box? {Yes or If no, why not?:20788} Pt endorses ***% compliance  We discussed: {Pharmacy options:24294} Patient decided to: {US Pharmacy Plan:23885}  Care Plan and Follow Up Patient Decision:  {FOLLOWUP:24991}  Plan: {CM FOLLOW UP PTWS:56812}  ***  Current Barriers:  {pharmacybarriers:24917}  Pharmacist Clinical Goal(s):  Patient will {PHARMACYGOALCHOICES:24921} through collaboration with PharmD and provider.   Interventions: 1:1 collaboration with Jordan Crews, MD regarding development and update of comprehensive plan of care as evidenced by provider attestation and co-signature Inter-disciplinary care team collaboration (see longitudinal plan of care) Comprehensive medication review performed; medication list updated in electronic medical record  Hypertension (BP goal {CHL HP UPSTREAM Pharmacist BP ranges:[phone]}) -{US controlled/uncontrolled:25276} -Current treatment: Amlodipine 2.5 mg daily as needed  Losartan 100 mg daily  Triamterene-HCTZ  37.5-25 mg daily  -Medications previously tried: ***  -Current home readings: *** -Current dietary habits: *** -Current exercise habits: *** -{ACTIONS;DENIES/REPORTS:21021675} hypotensive/hypertensive symptoms -Educated on {CCM BP Counseling:25124} -Counseled to monitor BP at home ***, document, and provide log at future appointments -{CCMPHARMDINTERVENTION:25122}  Hyperlipidemia: (LDL goal < ***) -{US controlled/uncontrolled:25276} -Current treatment: Aspirin 81 mg daily  -Medications previously tried: ***  -Current dietary patterns: *** -Current exercise habits: *** -Educated on {CCM HLD Counseling:25126} -{CCMPHARMDINTERVENTION:25122}  Depression/Anxiety (  Goal: ***) -{US controlled/uncontrolled:25276} -Current treatment: Alprazolam 0.25 mg three times daily  Sertraline 100 mg daily  Trazodone 150 mg nightly as needed  -Medications previously tried/failed: *** -PHQ9: *** -GAD7: *** -Connected with *** for mental health support -Educated on {CCM mental health counseling:25127} -{CCMPHARMDINTERVENTION:25122}  Patient Goals/Self-Care Activities Patient will:  - {pharmacypatientgoals:24919}  Follow Up Plan: {CM FOLLOW UP WTGR:03014}

## 2020-12-25 ENCOUNTER — Telehealth: Payer: Self-pay | Admitting: *Deleted

## 2020-12-25 NOTE — Chronic Care Management (AMB) (Signed)
  Care Management   Note  12/25/2020 Name: [name] MRN: [id number] DOB: 08-Dec-1933  Jordan Gordon is a 87 y.o. year old female who is a primary care patient of Virginia Crews, MD and is actively engaged with the care management team. I reached out to Gerldine W Detamore by phone today to assist with scheduling an initial visit with the Pharmacist  Follow up plan: Unsuccessful telephone outreach attempt made. A HIPAA compliant phone message was left for the patient providing contact information and requesting a return call.  The care management team will reach out to the patient again over the next 7 days.  If patient returns call to provider office, please advise to call [place] at [phone].  [place] Management  Direct Dial: [phone]

## 2020-12-26 ENCOUNTER — Ambulatory Visit: Payer: Self-pay

## 2020-12-26 DIAGNOSIS — I10 Essential (primary) hypertension: Secondary | ICD-10-CM

## 2020-12-26 NOTE — Chronic Care Management (AMB) (Signed)
  Chronic Care Management   CCM RN Visit Note  12/26/2020 Name: Rosell Khouri Adelson MRN: [id number] DOB: 19-Jul-1933  Subjective: Jordan Gordon is a 87 y.o. year old female who is a primary care patient of Bacigalupo, Dionne Bucy, MD. The care management team was consulted for assistance with disease management and care coordination needs.    Engaged with patient by telephone for initial visit in response to provider referral for case management and/or care coordination services.   Mrs. Fidalgo indicated that she preferred not to complete the telephonic outreach today. Requesting outreach at a later time. Anticipate outreach within the next two weeks if she is available.    PLAN: A member of the care management team will follow up with Mrs. Sikorski within the next two weeks.   [facility] Care Management [facility] [phone]

## 2020-12-26 NOTE — Chronic Care Management (AMB) (Signed)
Error Please Disregard

## 2020-12-31 NOTE — Chronic Care Management (AMB) (Signed)
  Care Management   Note  12/31/2020 Name: Arlene Slutsky Lowdermilk MRN: [id number] DOB: [birth year]  Candy Sledge Marcano is a 87 y.o. year old female who is a primary care patient of Virginia Crews, MD and is actively engaged with the care management team. I reached out to Hadleigh W Quadros by phone today to assist with re-scheduling an initial visit with the Pharmacist  Follow up plan: Unsuccessful telephone outreach attempt made. A HIPAA compliant phone message was left for the patient providing contact information and requesting a return call.  The care management team will reach out to the patient again over the next 7 days.  If patient returns call to provider office, please advise to call [place]  at [phone].  [place] Management  Direct Dial: [phone]

## 2021-01-07 ENCOUNTER — Ambulatory Visit: Payer: PPO | Admitting: Neurology

## 2021-01-07 NOTE — Chronic Care Management (AMB) (Signed)
  Care Management   Note  01/07/2021 Name: Jordan Gordon MRN: [id number] DOB: 1933-07-11  Jordan Gordon is a 87 y.o. year old female who is a primary care patient of Virginia Crews, MD and is actively engaged with the care management team. I reached out to Baker W Dommer by phone today to assist with re-scheduling an initial visit with the Pharmacist  Follow up plan: A third unsuccessful telephone outreach attempt made. A HIPAA compliant phone message was left for the patient providing contact information and requesting a return call. We have been unable to make contact with the patient for follow up. The care management team is available to follow up with the patient after provider conversation with the patient regarding recommendation for care management engagement and subsequent re-referral to the care management team. If patient returns call to provider office, please advise to call [place] at [phone].  [place] Management  Direct Dial: [phone]

## 2021-01-10 ENCOUNTER — Telehealth: Payer: Self-pay

## 2021-01-10 ENCOUNTER — Telehealth: Payer: Self-pay | Admitting: *Deleted

## 2021-01-10 NOTE — Chronic Care Management (AMB) (Signed)
  Chronic Care Management   Outreach Note  01/10/2021 Name: Jordan Gordon MRN: [id number] DOB: 1934/02/24  Jordan Gordon is a 87 y.o. year old female who is a primary care patient of Brita Romp, Dionne Bucy, MD. I reached out to [name] by phone today in response to a referral sent by Ms. Jordan Sledge Brass's PCP, Brita Romp Dionne Bucy, MD      An unsuccessful telephone outreach was attempted today. The patient was referred to the case management team for assistance with care management and care coordination.   Follow Up Plan: A HIPAA compliant phone message was left for the patient providing contact information and requesting a return call.  The care management team will reach out to the patient again over the next 7-10 days.  If patient returns call to provider office, please advise to call [place]* at [place] Management  Direct Dial: [phone]

## 2021-01-10 NOTE — Chronic Care Management (AMB) (Signed)
  Care Management   Note  01/10/2021 Name: Jordan Gordon MRN: [id number] DOB: 07/30/1933  Jordan Gordon is a 87 y.o. year old female who is a primary care patient of Virginia Crews, MD and is actively engaged with the care management team. I reached out to Teka W Lun by phone today to assist with scheduling an initial visit with the Pharmacist  Follow up plan: Patient son Harrie Jeans Scavone declines engagement by the care management team Pharmacist. Appropriate care team members and provider have been notified via electronic communication. The patient has been provided with contact information for the care management team and has been advised to call with any health related questions or concerns.   [place] Management  Direct Dial: [phone]

## 2021-01-10 NOTE — Telephone Encounter (Signed)
  Care Management   Follow Up Note   01/10/2021 Name: Jordan Gordon MRN: [id number] DOB: 05-26-1934   Primary Care Provider: Virginia Crews, MD Reason for referral : Chronic Care Management   An unsuccessful telephone outreach was attempted today. The patient was referred to the case management team for assistance with care management and care coordination.    Follow Up Plan:  A member of the care management team will attempt to reach [name] within the next two weeks.   [facility] Care [facility] [phone]

## 2021-01-21 ENCOUNTER — Ambulatory Visit (INDEPENDENT_AMBULATORY_CARE_PROVIDER_SITE_OTHER): Payer: PPO

## 2021-01-21 DIAGNOSIS — I10 Essential (primary) hypertension: Secondary | ICD-10-CM

## 2021-01-21 NOTE — Chronic Care Management (AMB) (Signed)
  Chronic Care Management   CCM RN Visit Note  01/21/2021 Name: Jordan Gordon MRN: [id number] DOB: 1933/10/01  Subjective: Jordan Gordon is a 87 y.o. year old female who is a primary care patient of Bacigalupo, Dionne Bucy, MD. The care management team was consulted for assistance with disease management and care coordination needs.    Her primary care provider will be notified of our unsuccessful attempts to establish and maintain contact. The care management team will gladly outreach at any time in the future if she is interested in receiving assistance.   PLAN The care management team will gladly follow up with Jordan Gordon after the primary care provider has a conversation with her regarding recommendation for care management engagement and subsequent re-referral for care management services.    [facility] Care Management [facility] [phone]

## 2021-01-28 ENCOUNTER — Other Ambulatory Visit: Payer: Self-pay | Admitting: Physician Assistant

## 2021-01-28 DIAGNOSIS — I10 Essential (primary) hypertension: Secondary | ICD-10-CM

## 2021-02-05 ENCOUNTER — Ambulatory Visit: Payer: PPO | Admitting: Neurology

## 2021-02-05 ENCOUNTER — Encounter: Payer: Self-pay | Admitting: Neurology

## 2021-02-05 ENCOUNTER — Telehealth: Payer: Self-pay | Admitting: Neurology

## 2021-02-05 VITALS — BP 188/81 | HR 81 | Ht 65.0 in | Wt 154.5 lb

## 2021-02-05 DIAGNOSIS — G309 Alzheimer's disease, unspecified: Secondary | ICD-10-CM | POA: Diagnosis not present

## 2021-02-05 DIAGNOSIS — G3184 Mild cognitive impairment, so stated: Secondary | ICD-10-CM

## 2021-02-05 NOTE — Telephone Encounter (Signed)
health team order sent to GI. NPR they will reach out to the patient to schedule.

## 2021-02-05 NOTE — Progress Notes (Signed)
[facility]  PATIENT: Jordan Gordon DOB: Sep 07, 1933  REFERRING CLINICIAN: Chrismon, Vickki Muff, PA-C HISTORY FROM: Patient and son [name] REASON FOR VISIT: Memory problems    HISTORICAL  CHIEF COMPLAINT:  Chief Complaint  Patient presents with   Memory Loss    New patient: internal referral:  memory loss, moodiness Room 38, son [name] in room    HISTORY OF PRESENT ILLNESS:  This is a 87 year old woman with past medical history of hypertension, anxiety, coronary artery disease, insomnia who is presenting for memory problem.  Per son about 2 to 65-month ago patient had multiple complaints, including complaining of abdominal pain, stating that she was "in her deathbed" and per son she was self-medicating.  At that time she was noted that she had memory problem and also had mood swing, she was very irritable.  They noted that she was taking more of her Xanax and they take away all the prescription pills.  Now she has a pillbox and family has been giving patient her pills in the morning and also in the evening.  Since removing the pills they noted that her mood has much improved.  She states she still have issues with memory, sometimes having issue remembering the date or day of the week, sometime have an issue recalling conversation and son also noted that she repeat herself, tend to say the same story like she is saying it the first time.  Currently patient lives alone, does not drive, she prepares her meals herself (microwave) and take a few walk during the day, she denies been lost during these walks.  She said one of her sons live across the street, she does play cards on the Internet and goes out to eat a lot.  She also goes to church.  Son stated patient does not have any advanced directive, or power of attorney or will, but family is working towards getting one.   OTHER MEDICAL CONDITIONS: Anxiety depression, hypertension, hyperlipidemia, coronary artery disease, GERD,  insomnia   REVIEW OF SYSTEMS: Full 14 system review of systems performed and negative with exception of:  as noted in the HPI.   ALLERGIES: Allergies  Allergen Reactions   Codeine     nausea    HOME MEDICATIONS: Outpatient Medications Prior to Visit  Medication Sig Dispense Refill   ALPRAZolam (XANAX) 0.25 MG tablet TAKE ONE TABLET 3 TIMES DAILY AS NEEDED FOR ANXIETY 90 tablet 5   aspirin EC 81 MG EC tablet Take 1 tablet (81 mg total) by mouth daily. Swallow whole. 30 tablet 11   brimonidine (ALPHAGAN) 0.2 % ophthalmic solution 1 drop 3 (three) times daily.     dorzolamide-timolol (COSOPT) 22.3-6.8 MG/ML ophthalmic solution 1 drop 2 (two) times daily.     losartan (COZAAR) 100 MG tablet Take 1 tablet (100 mg total) by mouth daily. 90 tablet 1   sertraline (ZOLOFT) 100 MG tablet Take 1 tablet (100 mg total) by mouth daily. 90 tablet 1   traZODone (DESYREL) 150 MG tablet Take 1 tablet (150 mg total) by mouth at bedtime as needed for sleep. 90 tablet 1   triamterene-hydrochlorothiazide (MAXZIDE-25) 37.5-25 MG tablet Take 1 tablet by mouth daily. 90 tablet 1   amLODipine (NORVASC) 2.5 MG tablet Take 1 tablet (2.5 mg total) by mouth daily. Add if BP over 180/100 each day. (Patient not taking: Reported on 11/26/2020) 30 tablet 0   potassium chloride (KLOR-CON) 10 MEQ tablet Take 1 tablet (10 mEq total) by mouth daily. 3[place]  tablet 0   No facility-administered medications prior to visit.    PAST MEDICAL HISTORY: Past Medical History:  Diagnosis Date   Anxiety    Coronary artery disease    GERD (gastroesophageal reflux disease)    HOH (hard of hearing)    Hyperlipidemia    Hypertension    Major depressive disorder     PAST SURGICAL HISTORY: Past Surgical History:  Procedure Laterality Date   ABDOMINAL HYSTERECTOMY  1979   BREAST EXCISIONAL BIOPSY Left 2005   benign   CATARACT EXTRACTION W/PHACO Left 02/08/2018   Procedure: CATARACT EXTRACTION PHACO AND INTRAOCULAR LENS PLACEMENT  ([place]);  Surgeon: Birder Robson, MD;  Location: [facility] ORS;  Service: Ophthalmology;  Laterality: Left;  [place] 00:33.4 AP% 13.9 CDE 4.66 Fluid Pack lot # [id number]   COLONOSCOPY WITH PROPOFOL N/A 07/22/2015   Procedure: COLONOSCOPY WITH PROPOFOL;  Surgeon: Manya Silvas, MD;  Location: [facility] ENDOSCOPY;  Service: Endoscopy;  Laterality: N/A;   COLONOSCOPY WITH PROPOFOL N/A 10/19/2016   Procedure: COLONOSCOPY WITH PROPOFOL;  Surgeon: Manya Silvas, MD;  Location: [facility] ENDOSCOPY;  Service: Endoscopy;  Laterality: N/A;   COLONOSCOPY WITH PROPOFOL N/A 09/18/2020   Procedure: COLONOSCOPY WITH PROPOFOL;  Surgeon: Robert Bellow, MD;  Location: [facility] ENDOSCOPY;  Service: Endoscopy;  Laterality: N/A;   ESOPHAGOGASTRODUODENOSCOPY (EGD) WITH PROPOFOL N/A 09/18/2020   Procedure: ESOPHAGOGASTRODUODENOSCOPY (EGD) WITH PROPOFOL;  Surgeon: Robert Bellow, MD;  Location: [facility] ENDOSCOPY;  Service: Endoscopy;  Laterality: N/A;   JOINT REPLACEMENT     TKR   KNEE SURGERY Left 2011   2007   TONSILLECTOMY  1940   ADENOIDS    FAMILY HISTORY: Family History  Problem Relation Age of Onset   Cancer Mother    Heart disease Father    Cancer Brother    Breast cancer Neg Hx     SOCIAL HISTORY: Social History   Socioeconomic History   Marital status: Widowed    Spouse name: Not on file   Number of children: 2   Years of education: Not on file   Highest education level: High school graduate  Occupational History   Occupation: retired  Tobacco Use   Smoking status: Never   Smokeless tobacco: Never  Vaping Use   Vaping Use: Never used  Substance and Sexual Activity   Alcohol use: Yes    Alcohol/week: 0.0 standard drinks    Comment: wine once every 2 weeks   Drug use: No   Sexual activity: Not on file  Other Topics Concern   Not on file  Social History Narrative   Lives alone   Right handed   Drinks 1 cup caffeine daily   Social Determinants of Health   Financial Resource Strain: Low  Risk    Difficulty of Paying Living Expenses: Not hard at all  Food Insecurity: No Food Insecurity   Worried About Charity fundraiser in the Last Year: Never true   Ran Out of Food in the Last Year: Never true  Transportation Needs: No Transportation Needs   Lack of Transportation (Medical): No   Lack of Transportation (Non-Medical): No  Physical Activity: Inactive   Days of Exercise per Week: 0 days   Minutes of Exercise per Session: 0 min  Stress: No Stress Concern Present   Feeling of Stress : Only a little  Social Connections: Moderately Integrated   Frequency of Communication with Friends and Family: More than three times a week   Frequency of Social Gatherings with Friends and  Family: More than three times a week   Attends Religious Services: More than 4 times per year   Active Member of Clubs or Organizations: No   Attends Archivist Meetings: Never   Marital Status: Married  Human resources officer Violence: Not At Risk   Fear of Current or Ex-Partner: No   Emotionally Abused: No   Physically Abused: No   Sexually Abused: No     PHYSICAL EXAM  GENERAL EXAM/CONSTITUTIONAL: Vitals:  Vitals:   02/05/21 1259  BP: (!) 188/81  Pulse: 81  Weight: 154 lb 8 oz (70.1 kg)  Height: '5\' 5"'$  (1.651 m)   Body mass index is 25.71 kg/m. Wt Readings from Last 3 Encounters:  02/05/21 154 lb 8 oz (70.1 kg)  11/26/20 156 lb 11.2 oz (71.1 kg)  11/14/20 155 lb (70.3 kg)   Patient is in no distress; well developed, nourished and groomed; neck is supple  CARDIOVASCULAR: Examination of carotid arteries is normal; no carotid bruits Regular rate and rhythm, no murmurs Examination of peripheral vascular system by observation and palpation is normal  EYES: Pupils round and reactive to light, Visual fields full to confrontation, Extraocular movements intacts,   MUSCULOSKELETAL: Gait, strength, tone, movements noted in Neurologic exam below  NEUROLOGIC: MENTAL STATUS:  MMSE -  [place] Exam 02/05/2021 07/12/2017  Orientation to time 1 5  Orientation to Place 5 5  Registration 3 3  Attention/ Calculation 4 4  Recall 2 3  Language- name 2 objects 2 2  Language- repeat 1 0  Language- follow 3 step command 3 3  Language- read & follow direction 1 1  Write a sentence 1 1  Copy design 0 0  Total score 23 27   awake, alert, oriented to person, place and time language fluent, comprehension intact, naming intact fund of knowledge appropriate  CRANIAL NERVE:  2nd - no papilledema or hemorrhages on fundoscopic exam 2nd, 3rd, 4th, 6th - pupils equal and reactive to light, visual fields full to confrontation, extraocular muscles intact, no nystagmus 5th - facial sensation symmetric 7th - facial strength symmetric 8th - hearing intact 9th - palate elevates symmetrically, uvula midline 11th - shoulder shrug symmetric 12th - tongue protrusion midline  MOTOR:  normal bulk and tone, full strength in the BUE, BLE  SENSORY:  normal and symmetric to light touch, pinprick, temperature, vibration  COORDINATION:  finger-nose-finger, fine finger movements normal  REFLEXES:  deep tendon reflexes present and symmetric  GAIT/STATION:  normal    DIAGNOSTIC DATA (LABS, IMAGING, TESTING) - I reviewed patient records, labs, notes, testing and imaging myself where available.  Lab Results  Component Value Date   WBC 8.3 11/11/2020   HGB 13.9 11/11/2020   HCT 39.2 11/11/2020   MCV 86.0 11/11/2020   PLT 197 11/11/2020      Component Value Date/Time   NA 146 (H) 11/26/2020 1600   NA 142 06/02/2014 1953   K 3.4 (L) 11/26/2020 1600   K 2.8 (L) 06/02/2014 1953   CL 104 11/26/2020 1600   CL 105 06/02/2014 1953   CO2 27 11/26/2020 1600   CO2 29 06/02/2014 1953   GLUCOSE 82 11/26/2020 1600   GLUCOSE 150 (H) 11/11/2020 0752   GLUCOSE 133 (H) 06/02/2014 1953   BUN 19 11/26/2020 1600   BUN 20 (H) 06/02/2014 1953   CREATININE 1.05 (H) 11/26/2020 1600    CREATININE 0.98 (H) 05/05/2017 1012   CALCIUM 9.3 11/26/2020 1600   CALCIUM 8.4 (L) 06/02/2014  1953   PROT 6.6 11/11/2020 0752   PROT 7.0 08/26/2020 1513   PROT 7.2 01/12/2013 0838   ALBUMIN 3.9 11/11/2020 0752   ALBUMIN 4.6 08/26/2020 1513   ALBUMIN 3.7 01/12/2013 0838   AST 22 11/11/2020 0752   AST 29 01/12/2013 0838   ALT 15 11/11/2020 0752   ALT 35 01/12/2013 0838   ALKPHOS 60 11/11/2020 0752   ALKPHOS 79 01/12/2013 0838   BILITOT 1.1 11/11/2020 0752   BILITOT 0.4 08/26/2020 1513   BILITOT 0.4 01/12/2013 0838   GFRNONAA >60 11/11/2020 0752   GFRNONAA 54 (L) 05/05/2017 1012   GFRAA 57 (L) 04/17/2020 1123   GFRAA 63 05/05/2017 1012   Lab Results  Component Value Date   CHOL 191 10/29/2020   HDL 48 10/29/2020   LDLCALC 116 (H) 10/29/2020   TRIG 137 10/29/2020   CHOLHDL 4.0 10/29/2020   Lab Results  Component Value Date   HGBA1C 5.2 10/29/2020   No results found for: [id number] Lab Results  Component Value Date   TSH 3.479 12/26/2018    No brain images available for review.    ASSESSMENT AND PLAN  87 y.o. year old female with anxiety depression, hypertension, hyperlipidemia, CAD, GERD who is presenting for memory problem.  Per son patient was having trouble with memory and changes in her mood a couple months ago.  They noted at that point she was very irritable and also that she was taking more than her usual dose of medication including Xanax.  Since taking away the medications and giving patient her medication, son reported mood has improved.  When it comes to memory, she does have issues with remembering the dates, sometimes repeating the same stories over and over but otherwise she is able to function independently.  He lives alone, cook and clean by herself and son is handling the finances.  On exam today her Mini-Mental status was 23 out of 30 when 3 years ago it was 27 out of 30.  Informed patient and son that this is consistent with mild cognitive impairment.   We discussed about obtaining a brain MRI and starting Aricept.  They are comfortable with the brain MRI but would like to defer the Aricept until completion of the MRI brain.  At that time we can re-discussed starting Aricept.  I will follow-up with the patient in 52-month   1. Mild cognitive impairment   2. Alzheimer's disease, unspecified (CODE) ([place]     PLAN: MRI Brain without contrast  Return in 6 months   Orders Placed This Encounter  Procedures   MR BRAIN WO CONTRAST    No orders of the defined types were placed in this encounter.   Return in about 6 months (around 08/05/2021).    AAlric Ran MD 02/05/2021, 1:47 PM  [facility] [address] [place] [phone]

## 2021-02-05 NOTE — Patient Instructions (Signed)
MRI Brain without contrast  Return in 6 months

## 2021-02-17 ENCOUNTER — Other Ambulatory Visit: Payer: Self-pay

## 2021-02-17 ENCOUNTER — Ambulatory Visit
Admission: RE | Admit: 2021-02-17 | Discharge: 2021-02-17 | Disposition: A | Discharge status: Home / Self Care | Payer: PPO | Source: Ambulatory Visit | Attending: Neurology | Admitting: Neurology

## 2021-02-17 DIAGNOSIS — G309 Alzheimer's disease, unspecified: Secondary | ICD-10-CM

## 2021-02-19 NOTE — Telephone Encounter (Signed)
Pt son [name] called asking about his mother MRI results. Requesting a call back.

## 2021-02-20 NOTE — Telephone Encounter (Signed)
Attempted to call son, LVM for son to let us what questions he has for MD

## 2021-02-20 NOTE — Telephone Encounter (Signed)
I spoke to the patient's son. States he talked w/ Dr. April Manson yesterday and his questions have been answered.

## 2021-03-03 ENCOUNTER — Other Ambulatory Visit: Payer: Self-pay | Admitting: Family Medicine

## 2021-03-03 DIAGNOSIS — G4700 Insomnia, unspecified: Secondary | ICD-10-CM

## 2021-03-11 ENCOUNTER — Other Ambulatory Visit: Payer: Self-pay | Admitting: Physician Assistant

## 2021-03-11 DIAGNOSIS — F419 Anxiety disorder, unspecified: Secondary | ICD-10-CM

## 2021-03-12 NOTE — Telephone Encounter (Signed)
Requested medication (s) are due for refill today: Yes  Requested medication (s) are on the active medication list: Yes  Last refill:  09/02/20  Future visit scheduled: No  Notes to clinic:  Unable to refill per protocol, cannot delegate.      Requested Prescriptions  Pending Prescriptions Disp Refills   ALPRAZolam (XANAX) 0.25 MG tablet [Pharmacy Med Name: ALPRAZOLAM 0.25 MG TAB] 90 tablet     Sig: TAKE ONE TABLET BY MOUTH 3 TIMES DAILY AS NEEDED FOR ANXIETY     Not Delegated - Psychiatry:  Anxiolytics/Hypnotics Failed - 03/12/2021  4:23 PM      Failed - This refill cannot be delegated      Failed - Urine Drug Screen completed in last 360 days      Passed - Valid encounter within last 6 months    Recent Outpatient Visits           3 months ago Essential (primary) hypertension   [facility], Dionne Bucy, MD   3 months ago Hypertensive urgency   [place], PA-C   6 months ago Chronic idiopathic constipation   [facility] [place], Clearnce Sorrel, [place]   1 year ago Influenza vaccination declined   [facility], Clearnce Sorrel, [place]   1 year ago Essential hypertension   [place], [place], [place]

## 2021-03-12 NOTE — Telephone Encounter (Signed)
Medication Refill - Medication: ALPRAZolam (XANAX) 0.25 MG tablet  Has the patient contacted their pharmacy? Yes.   (Agent: If no, request that the patient contact the pharmacy for the refill.) (Agent: If yes, when and what did the pharmacy advise?)request sent to jennifer burnette / no response   Preferred Pharmacy (with phone number or street name): [facility]  Has the patient been seen for an appointment in the last year OR does the patient have an upcoming appointment? Yes.    Agent: Please be advised that RX refills [place] take up to 3 business days. We ask that you follow-up with your pharmacy.

## 2021-04-04 ENCOUNTER — Telehealth: Payer: Self-pay

## 2021-04-04 NOTE — Telephone Encounter (Signed)
Copied from [place] [id number]. Topic: Appointment Scheduling - Scheduling Inquiry for Clinic >> Apr 03, 2021  4:40 PM [place] wrote: Reason for CRM: Pt son, Jordan Gordon has called and his mother whose pt needs a med refill tappt to continue meds. Jordan Gordon is needing one of the 2 new providers to see if they have a sooner appt. Pls to advise Jordan Gordon, [phone]

## 2021-04-21 ENCOUNTER — Encounter: Payer: Self-pay | Admitting: Family Medicine

## 2021-04-21 ENCOUNTER — Ambulatory Visit (INDEPENDENT_AMBULATORY_CARE_PROVIDER_SITE_OTHER): Payer: PPO | Admitting: Family Medicine

## 2021-04-21 ENCOUNTER — Other Ambulatory Visit: Payer: Self-pay

## 2021-04-21 VITALS — BP 207/100 | HR 95 | Resp 16 | Wt 148.8 lb

## 2021-04-21 DIAGNOSIS — I160 Hypertensive urgency: Secondary | ICD-10-CM | POA: Diagnosis not present

## 2021-04-21 DIAGNOSIS — I10 Essential (primary) hypertension: Secondary | ICD-10-CM

## 2021-04-21 DIAGNOSIS — F419 Anxiety disorder, unspecified: Secondary | ICD-10-CM | POA: Diagnosis not present

## 2021-04-21 DIAGNOSIS — F331 Major depressive disorder, recurrent, moderate: Secondary | ICD-10-CM | POA: Diagnosis not present

## 2021-04-21 DIAGNOSIS — Z23 Encounter for immunization: Secondary | ICD-10-CM | POA: Diagnosis not present

## 2021-04-21 DIAGNOSIS — R630 Anorexia: Secondary | ICD-10-CM | POA: Insufficient documentation

## 2021-04-21 DIAGNOSIS — F5104 Psychophysiologic insomnia: Secondary | ICD-10-CM

## 2021-04-21 MED ORDER — SERTRALINE HCL 100 MG PO TABS: 100.0000 mg | ORAL_TABLET | Freq: Every day | ORAL | 3 refills | Status: DC

## 2021-04-21 MED ORDER — ALPRAZOLAM 0.25 MG PO TABS: ORAL_TABLET | ORAL | 0 refills | Status: DC

## 2021-04-21 MED ORDER — LOSARTAN POTASSIUM 100 MG PO TABS: 100.0000 mg | ORAL_TABLET | Freq: Every day | ORAL | 3 refills | Status: DC

## 2021-04-21 MED ORDER — TRIAMTERENE-HCTZ 37.5-25 MG PO TABS: 1.0000 | ORAL_TABLET | Freq: Every day | ORAL | 1 refills | Status: DC

## 2021-04-21 MED ORDER — TRAZODONE HCL 150 MG PO TABS: 150.0000 mg | ORAL_TABLET | Freq: Every day | ORAL | 3 refills | Status: DC

## 2021-04-21 NOTE — Assessment & Plan Note (Signed)
Pulled for medication refill 

## 2021-04-21 NOTE — Assessment & Plan Note (Signed)
Chronic, uncontrolled Not checking BP at home Presents with granddaughter- granddaughter will advise her parents to assist with home BP setup and ensure second medication is picked up

## 2021-04-21 NOTE — Progress Notes (Signed)
Established patient visit   Patient: Jordan Gordon   DOB: 01/04/1934   87 y.o. Female  MRN: [id number] Visit Date: 04/21/2021  Today's healthcare provider: Gwyneth Sprout, FNP   Chief Complaint  Patient presents with   Hypertension   Subjective    HPI  Hypertension, follow-up  BP Readings from Last 3 Encounters:  04/21/21 (!) 207/100  02/05/21 (!) 188/81  11/26/20 (!) 193/95   Wt Readings from Last 3 Encounters:  04/21/21 148 lb 12.8 oz (67.5 kg)  02/05/21 154 lb 8 oz (70.1 kg)  11/26/20 156 lb 11.2 oz (71.1 kg)     She was last seen for hypertension 5 months ago.  BP at that visit was 193/95. Management since that visit includes will resume maxzide and increase losartan to 153m daily as Rx'd Hold amlodipine pending change in BP with these changes. Patient reports today that she is not taking Maxzide.  She reports fair compliance with treatment. She is not having side effects.  She is following a Regular diet. She is exercising. She does not smoke.  Use of agents associated with hypertension: none.   Outside blood pressures are not being checked. Symptoms: No chest pain No chest pressure  No palpitations No syncope  No dyspnea No orthopnea  No paroxysmal nocturnal dyspnea No lower extremity edema   Pertinent labs: Lab Results  Component Value Date   CHOL 191 10/29/2020   HDL 48 10/29/2020   LDLCALC 116 (H) 10/29/2020   TRIG 137 10/29/2020   CHOLHDL 4.0 10/29/2020   Lab Results  Component Value Date   NA 146 (H) 11/26/2020   K 3.4 (L) 11/26/2020   CREATININE 1.05 (H) 11/26/2020   EGFR 52 (L) 11/26/2020   GLUCOSE 82 11/26/2020   TSH 3.479 12/26/2018     The ASCVD Risk score (Arnett DK, et al., 2019) failed to calculate for the following reasons:   The 2019 ASCVD risk score is only valid for ages 480to 784  ---------------------------------------------------------------------------------------------------  Follow up for Insomnia  The patient was  last seen for this 5 months ago. Changes made at last visit include increase Trazadone to 1515m  She reports excellent compliance with treatment. She feels that condition is Unchanged. She is not having side effects.   -----------------------------------------------------------------------------------------  Depression, Follow-up  She  was last seen for this 5 months ago. Changes made at last visit include Chronic and well controlled Recommend change in zoloft dose to minimize pill burden (10068maily).   She reports excellent compliance with treatment. She is not having side effects.   She reports good tolerance of treatment. Current symptoms include: depressed mood, impaired memory, insomnia, and psychomotor agitation She feels she is Unchanged since last visit.  [facility] 04/21/2021 11/26/2020 11/14/2020  Decreased Interest _0 Down, Depressed, Hopeless 0 3 3  PHQ - 2 Score _1 Altered sleeping _2 Tired, decreased energy 0 2 3  Change in appetite 0 0 3  Feeling bad or failure about yourself  _3 Trouble concentrating 0 2 2  Moving slowly or fidgety/restless 0 1 3  Suicidal thoughts 0 0 3  PHQ-9 Score _4 Difficult doing work/chores Not difficult at all Extremely dIfficult Extremely dIfficult  Some recent data might be hidden    -----------------------------------------------------------------------------------------   Medications: Outpatient Medications Prior to Visit  Medication Sig   [DISCONTINUED] ALPRAZolam (XANAX) 0.25 MG  tablet TAKE ONE TABLET BY MOUTH 3 TIMES DAILY AS NEEDED FOR ANXIETY   [DISCONTINUED] losartan (COZAAR) 100 MG tablet Take 1 tablet (100 mg total) by mouth daily.   [DISCONTINUED] sertraline (ZOLOFT) 100 MG tablet Take 1 tablet (100 mg total) by mouth daily.   [DISCONTINUED] traZODone (DESYREL) 150 MG tablet TAKE ONE TABLET (150 MG) BY MOUTH AT BEDTIME AS NEEDED FOR SLEEP (INCREASE INDOSE)   dorzolamide-timolol  (COSOPT) 22.3-6.8 MG/ML ophthalmic solution 1 drop 2 (two) times daily.   [DISCONTINUED] aspirin EC 81 MG EC tablet Take 1 tablet (81 mg total) by mouth daily. Swallow whole. (Patient not taking: Reported on 04/21/2021)   [DISCONTINUED] brimonidine (ALPHAGAN) 0.2 % ophthalmic solution 1 drop 3 (three) times daily. (Patient not taking: Reported on 04/21/2021)   [DISCONTINUED] triamterene-hydrochlorothiazide (MAXZIDE-25) 37.5-25 MG tablet Take 1 tablet by mouth daily. (Patient not taking: Reported on 04/21/2021)   No facility-administered medications prior to visit.    Review of Systems     Objective    BP (!) 207/100   Pulse 95   Resp 16   Wt 148 lb 12.8 oz (67.5 kg)   BMI 24.76 kg/m    Physical Exam Vitals and nursing note reviewed.  Constitutional:      General: She is not in acute distress.    Appearance: Normal appearance. She is normal weight. She is not ill-appearing, toxic-appearing or diaphoretic.  HENT:     Head: Normocephalic and atraumatic.     Right Ear: Decreased hearing noted.     Left Ear: Decreased hearing noted.     Ears:     Comments: Use of bilateral hearing aids  Cardiovascular:     Rate and Rhythm: Normal rate and regular rhythm.     Pulses: Normal pulses.     Heart sounds: Normal heart sounds. No murmur heard.   No friction rub. No gallop.  Pulmonary:     Effort: Pulmonary effort is normal. No respiratory distress.     Breath sounds: Normal breath sounds. No stridor. No wheezing, rhonchi or rales.  Chest:     Chest wall: No tenderness.  Abdominal:     General: Bowel sounds are normal.     Palpations: Abdomen is soft.  Musculoskeletal:        General: No swelling, tenderness, deformity or signs of injury. Normal range of motion.     Right lower leg: No edema.     Left lower leg: No edema.  Skin:    General: Skin is warm and dry.     Capillary Refill: Capillary refill takes less than 2 seconds.     Coloration: Skin is not jaundiced or pale.      Findings: No bruising, erythema, lesion or rash.  Neurological:     General: No focal deficit present.     Mental Status: She is alert and oriented to person, place, and time. Mental status is at baseline.     Cranial Nerves: No cranial nerve deficit.     Sensory: No sensory deficit.     Motor: No weakness.     Coordination: Coordination normal.  Psychiatric:        Mood and Affect: Mood normal.        Behavior: Behavior normal.        Thought Content: Thought content normal.        Judgment: Judgment normal.       No results found for any visits on 04/21/21.  Assessment & Plan  Problem List Items Addressed This Visit       Cardiovascular and Mediastinum   Essential (primary) hypertension    Chronic, uncontrolled Not checking BP at home Presents with granddaughter- granddaughter will advise her parents to assist with home BP setup and ensure second medication is picked up      Relevant Medications   losartan (COZAAR) 100 MG tablet   triamterene-hydrochlorothiazide (MAXZIDE-25) 37.5-25 MG tablet   Hypertensive urgency    Missing one Rx medication Re-ordered Denies complaints extending beyond depression      Relevant Medications   losartan (COZAAR) 100 MG tablet   triamterene-hydrochlorothiazide (MAXZIDE-25) 37.5-25 MG tablet     Other   Anxiety    Pulled for medication refill      Relevant Medications   ALPRAZolam (XANAX) 0.25 MG tablet   sertraline (ZOLOFT) 100 MG tablet   traZODone (DESYREL) 150 MG tablet   Insomnia    Pulled for medication refill      Relevant Medications   traZODone (DESYREL) 150 MG tablet   Major depressive disorder, recurrent episode, moderate (HCC)    Report of uncontrolled depression Denies SI/HI Wants additional medication Agreed to continue current plan and focus on BP at this time and ensure proper diet given weight loss      Relevant Medications   ALPRAZolam (XANAX) 0.25 MG tablet   sertraline (ZOLOFT) 100 MG tablet    traZODone (DESYREL) 150 MG tablet   Need for influenza vaccination - Primary    provided      Relevant Orders   Flu Vaccine QUAD High Dose(Fluad) (Completed)   Loss of appetite    Recent oral surgery, resulting in loss of appetite Samples of shakes provided         Return for blood pressure check, HTN management, nurse follow up.     Vonna Kotyk, FNP, have reviewed all documentation for this visit. The documentation on 04/21/21 for the exam, diagnosis, procedures, and orders are all accurate and complete.    Gwyneth Sprout, [place] [phone] (phone) [phone] (fax)  [place]

## 2021-04-21 NOTE — Assessment & Plan Note (Signed)
Recent oral surgery, resulting in loss of appetite Samples of shakes provided

## 2021-04-21 NOTE — Assessment & Plan Note (Signed)
Report of uncontrolled depression Denies SI/HI Wants additional medication Agreed to continue current plan and focus on BP at this time and ensure proper diet given weight loss

## 2021-04-21 NOTE — Assessment & Plan Note (Signed)
Missing one Rx medication Re-ordered Denies complaints extending beyond depression

## 2021-04-21 NOTE — Assessment & Plan Note (Signed)
provided ?

## 2021-04-29 ENCOUNTER — Ambulatory Visit (INDEPENDENT_AMBULATORY_CARE_PROVIDER_SITE_OTHER): Payer: PPO | Admitting: Family Medicine

## 2021-04-29 ENCOUNTER — Encounter: Payer: Self-pay | Admitting: Family Medicine

## 2021-04-29 ENCOUNTER — Other Ambulatory Visit: Payer: Self-pay

## 2021-04-29 VITALS — BP 200/84 | HR 90

## 2021-04-29 DIAGNOSIS — R6889 Other general symptoms and signs: Secondary | ICD-10-CM | POA: Diagnosis not present

## 2021-04-29 DIAGNOSIS — Z9114 Patient's other noncompliance with medication regimen: Secondary | ICD-10-CM

## 2021-04-29 DIAGNOSIS — I160 Hypertensive urgency: Secondary | ICD-10-CM | POA: Diagnosis not present

## 2021-04-29 MED ORDER — AMLODIPINE BESYLATE 10 MG PO TABS: 10.0000 mg | ORAL_TABLET | Freq: Every day | ORAL | 0 refills | Status: DC

## 2021-04-29 NOTE — Progress Notes (Deleted)
Pt came in for HTN nurse check; again, in a state of hypertensive urgency.  Add, Calcium Channel blocker.   Ensure son fills medication box with correct medications.  Referral placed back to Cardiology to re-establish and check for any further blockages.

## 2021-04-29 NOTE — Progress Notes (Signed)
    Established patient visit   Patient: Jordan Gordon   DOB: 11/28/1933   87 y.o. Female  MRN: [id number] Visit Date: 04/29/2021  Today's healthcare provider:  T , FNP   Chief Complaint  Patient presents with   Hypertension   Subjective    HPI  Hypertension, follow-up  BP Readings from Last 3 Encounters:  04/29/21 (!) 200/84  04/29/21 (!) 200/84  04/21/21 (!) 207/100   Wt Readings from Last 3 Encounters:  04/21/21 148 lb 12.8 oz (67.5 kg)  02/05/21 154 lb 8 oz (70.1 kg)  11/26/20 156 lb 11.2 oz (71.1 kg)     She was last seen for hypertension 2 weeks ago.  BP at that visit was 207/100. Management since that visit includes continue Losartan and Triamterene-HCTZ .  She reports fair compliance with treatment. She is not having side effects.  She is following a Regular diet. She is not exercising. She does not smoke.  Use of agents associated with hypertension: none.   Outside blood pressures are being checked but patient reports elevated.   Symptoms: No chest pain No chest pressure  No palpitations No syncope  No dyspnea No orthopnea  No paroxysmal nocturnal dyspnea No lower extremity edema   Pertinent labs: Lab Results  Component Value Date   CHOL 191 10/29/2020   HDL 48 10/29/2020   LDLCALC 116 (H) 10/29/2020   TRIG 137 10/29/2020   CHOLHDL 4.0 10/29/2020   Lab Results  Component Value Date   NA 146 (H) 11/26/2020   K 3.4 (L) 11/26/2020   CREATININE 1.05 (H) 11/26/2020   EGFR 52 (L) 11/26/2020   GLUCOSE 82 11/26/2020   TSH 3.479 12/26/2018     The ASCVD Risk score (Arnett DK, et al., 2019) failed to calculate for the following reasons:   The 2019 ASCVD risk score is only valid for ages 40 to 79   ---------------------------------------------------------------------------------------------------   Medications: Outpatient Medications Prior to Visit  Medication Sig   ALPRAZolam (XANAX) 0.25 MG tablet TAKE ONE TABLET BY MOUTH 3 TIMES  DAILY AS NEEDED FOR ANXIETY   dorzolamide-timolol (COSOPT) 22.3-6.8 MG/ML ophthalmic solution 1 drop 2 (two) times daily.   losartan (COZAAR) 100 MG tablet Take 1 tablet (100 mg total) by mouth daily.   sertraline (ZOLOFT) 100 MG tablet Take 1 tablet (100 mg total) by mouth daily.   traZODone (DESYREL) 150 MG tablet Take 1 tablet (150 mg total) by mouth at bedtime.   triamterene-hydrochlorothiazide (MAXZIDE-25) 37.5-25 MG tablet Take 1 tablet by mouth daily.   No facility-administered medications prior to visit.    Review of Systems     Objective    BP (!) 200/84 (BP Location: Left Arm, Patient Position: Sitting, Cuff Size: Normal)   Pulse 90    Physical Exam Vitals and nursing note reviewed.  Constitutional:      General: She is not in acute distress.    Appearance: Normal appearance. She is normal weight. She is not ill-appearing, toxic-appearing or diaphoretic.  HENT:     Head: Normocephalic and atraumatic.  Cardiovascular:     Rate and Rhythm: Normal rate and regular rhythm.     Pulses: Normal pulses.     Heart sounds: Normal heart sounds. No murmur heard.   No friction rub. No gallop.  Pulmonary:     Effort: Pulmonary effort is normal. No respiratory distress.     Breath sounds: Normal breath sounds. No stridor. No wheezing, rhonchi or rales.  Chest:       Chest wall: No tenderness.  Abdominal:     General: Bowel sounds are normal.     Palpations: Abdomen is soft.  Musculoskeletal:        General: No swelling, tenderness, deformity or signs of injury. Normal range of motion.     Right lower leg: No edema.     Left lower leg: No edema.  Skin:    General: Skin is warm and dry.     Capillary Refill: Capillary refill takes less than 2 seconds.     Coloration: Skin is not jaundiced or pale.     Findings: No bruising, erythema, lesion or rash.  Neurological:     General: No focal deficit present.     Mental Status: She is alert and oriented to person, place, and time.  Mental status is at baseline.     Cranial Nerves: No cranial nerve deficit.     Sensory: No sensory deficit.     Motor: No weakness.     Coordination: Coordination normal.  Psychiatric:        Mood and Affect: Mood normal.        Behavior: Behavior normal.        Thought Content: Thought content normal.        Judgment: Judgment normal.     No results found for any visits on 04/29/21.  Assessment & Plan     Problem List Items Addressed This Visit       Cardiovascular and Mediastinum   Hypertensive urgency - Primary    BP remains elevated Son is in charge of her pills       Relevant Medications   amLODipine (NORVASC) 10 MG tablet   Other Relevant Orders   Ambulatory referral to Cardiology     Other   Medication non-compliance due to excessive pill burden    Son is in charge of pills; brought to office by granddaughter      Forgetfulness    Does not know what pills she is taking or if her BP is being checked at home        Return if symptoms worsen or fail to improve.      Vonna Kotyk, FNP, have reviewed all documentation for this visit. The documentation on 04/29/21 for the exam, diagnosis, procedures, and orders are all accurate and complete.    Gwyneth Sprout, [place] [phone] (phone) [phone] (fax)  [place]

## 2021-04-29 NOTE — Assessment & Plan Note (Signed)
Does not know what pills she is taking or if her BP is being checked at home

## 2021-04-29 NOTE — Assessment & Plan Note (Signed)
Son is in charge of pills; brought to office by granddaughter

## 2021-04-29 NOTE — Assessment & Plan Note (Signed)
BP remains elevated Son is in charge of her pills

## 2021-06-25 DIAGNOSIS — M2391 Unspecified internal derangement of right knee: Secondary | ICD-10-CM | POA: Diagnosis not present

## 2021-06-25 DIAGNOSIS — M25561 Pain in right knee: Secondary | ICD-10-CM | POA: Diagnosis not present

## 2021-07-21 ENCOUNTER — Other Ambulatory Visit: Payer: Self-pay | Admitting: Family Medicine

## 2021-07-21 DIAGNOSIS — I160 Hypertensive urgency: Secondary | ICD-10-CM

## 2021-07-22 NOTE — Telephone Encounter (Signed)
Requested Prescriptions  Pending Prescriptions Disp Refills   amLODipine (NORVASC) 10 MG tablet [Pharmacy Med Name: AMLODIPINE BESYLATE 10 MG TAB] 90 tablet 0    Sig: TAKE 1 TABLET BY MOUTH DAILY.     Cardiovascular: Calcium Channel Blockers 2 Failed - 07/21/2021  4:23 PM      Failed - Last BP in normal range    BP Readings from Last 1 Encounters:  04/29/21 (!) 200/84         Passed - Last Heart Rate in normal range    Pulse Readings from Last 1 Encounters:  04/29/21 90         Passed - Valid encounter within last 6 months    Recent Outpatient Visits          2 months ago Hypertensive urgency   [facility] Gwyneth Sprout, FNP   3 months ago Need for influenza vaccination   [facility] Gwyneth Sprout, FNP   7 months ago Essential (primary) hypertension   [facility], Dionne Bucy, [place]   8 months ago Hypertensive urgency   [place], PA-C   11 months ago Chronic idiopathic constipation   [facility], [place], [place]

## 2021-07-30 ENCOUNTER — Other Ambulatory Visit: Payer: Self-pay | Admitting: Family Medicine

## 2021-07-30 DIAGNOSIS — I160 Hypertensive urgency: Secondary | ICD-10-CM

## 2021-07-31 NOTE — Telephone Encounter (Signed)
Requested Prescriptions  Pending Prescriptions Disp Refills   amLODipine (NORVASC) 10 MG tablet [Pharmacy Med Name: AMLODIPINE BESYLATE 10 MG TAB] 90 tablet 0    Sig: TAKE 1 TABLET BY MOUTH DAILY.     Cardiovascular: Calcium Channel Blockers 2 Failed - 07/30/2021  4:13 PM      Failed - Last BP in normal range    BP Readings from Last 1 Encounters:  04/29/21 (!) 200/84         Passed - Last Heart Rate in normal range    Pulse Readings from Last 1 Encounters:  04/29/21 90         Passed - Valid encounter within last 6 months    Recent Outpatient Visits          3 months ago Hypertensive urgency   [facility] Gwyneth Sprout, FNP   3 months ago Need for influenza vaccination   [facility] Gwyneth Sprout, FNP   8 months ago Essential (primary) hypertension   [facility], Dionne Bucy, [place]   8 months ago Hypertensive urgency   [place], PA-C   11 months ago Chronic idiopathic constipation   [facility], [place], [place]

## 2021-08-05 ENCOUNTER — Encounter: Payer: Self-pay | Admitting: Neurology

## 2021-08-05 ENCOUNTER — Ambulatory Visit: Payer: PPO | Admitting: Neurology

## 2021-08-05 VITALS — BP 172/80 | HR 80

## 2021-08-05 DIAGNOSIS — G301 Alzheimer's disease with late onset: Secondary | ICD-10-CM | POA: Diagnosis not present

## 2021-08-05 DIAGNOSIS — F02A0 Dementia in other diseases classified elsewhere, mild, without behavioral disturbance, psychotic disturbance, mood disturbance, and anxiety: Secondary | ICD-10-CM | POA: Diagnosis not present

## 2021-08-05 MED ORDER — DONEPEZIL HCL 10 MG PO TABS: 10.0000 mg | ORAL_TABLET | Freq: Every day | ORAL | 11 refills | Status: DC

## 2021-08-05 NOTE — Progress Notes (Signed)
[facility]  PATIENT: Jordan Gordon DOB: 03/24/1934  REFERRING CLINICIAN: Virginia Crews, MD HISTORY FROM: Patient and son [name] REASON FOR VISIT: Memory problems    HISTORICAL  CHIEF COMPLAINT:  Chief Complaint  Patient presents with   Follow-up    Rm 12, with granddaughter, reports no changes with memory     INTERVAL HISTORY 08/05/2021:  Patient presents today for follow-up, she is accompanied by her granddaughter.  Reported that her memory is the same, she still have trouble with short-term memory.  She said her biggest issue is her hearing loss, she does have hearing loss but her son does not want to get her hearing aid claiming that she would lose them.  She said it is aggravating to her when going to places and being able to hear well.  Her MRI brain was completed showing moderate generalized cortical atrophy that is more pronounced in the temporal lobe than elsewhere.  Other than that she is still independent, lives alone and family is helping with cooking cleaning and groceries.  Denies any recent falls.   HISTORY OF PRESENT ILLNESS:  This is a 88 year old woman with past medical history of hypertension, anxiety, coronary artery disease, insomnia who is presenting for memory problem.  Per son about 2 to 86-months ago patient had multiple complaints, including complaining of abdominal pain, stating that she was "in her deathbed" and per son she was self-medicating.  At that time she was noted that she had memory problem and also had mood swing, she was very irritable.  They noted that she was taking more of her Xanax and they take away all the prescription pills.  Now she has a pillbox and family has been giving patient her pills in the morning and also in the evening.  Since removing the pills they noted that her mood has much improved.  She states she still have issues with memory, sometimes having issue remembering the date or day of the week, sometime have  an issue recalling conversation and son also noted that she repeat herself, tend to say the same story like she is saying it the first time.  Currently patient lives alone, does not drive, she prepares her meals herself (microwave) and take a few walk during the day, she denies been lost during these walks.  She said one of her sons live across the street, she does play cards on the Internet and goes out to eat a lot.  She also goes to church.  Son stated patient does not have any advanced directive, or power of attorney or will, but family is working towards getting one.   OTHER MEDICAL CONDITIONS: Anxiety depression, hypertension, hyperlipidemia, coronary artery disease, GERD, insomnia   REVIEW OF SYSTEMS: Full 14 system review of systems performed and negative with exception of:  as noted in the HPI.   ALLERGIES: Allergies  Allergen Reactions   Codeine     nausea    HOME MEDICATIONS: Outpatient Medications Prior to Visit  Medication Sig Dispense Refill   ALPRAZolam (XANAX) 0.25 MG tablet TAKE ONE TABLET BY MOUTH 3 TIMES DAILY AS NEEDED FOR ANXIETY 90 tablet 0   amLODipine (NORVASC) 10 MG tablet TAKE 1 TABLET BY MOUTH DAILY. 90 tablet 0   dorzolamide-timolol (COSOPT) 22.3-6.8 MG/ML ophthalmic solution 1 drop 2 (two) times daily.     losartan (COZAAR) 100 MG tablet Take 1 tablet (100 mg total) by mouth daily. 90 tablet 3   sertraline (ZOLOFT) 100 MG tablet  Take 1 tablet (100 mg total) by mouth daily. 90 tablet 3   traZODone (DESYREL) 150 MG tablet Take 1 tablet (150 mg total) by mouth at bedtime. 90 tablet 3   triamterene-hydrochlorothiazide (MAXZIDE-25) 37.5-25 MG tablet Take 1 tablet by mouth daily. 90 tablet 1   No facility-administered medications prior to visit.    PAST MEDICAL HISTORY: Past Medical History:  Diagnosis Date   Anxiety    Coronary artery disease    GERD (gastroesophageal reflux disease)    HOH (hard of hearing)    Hyperlipidemia    Hypertension    Major  depressive disorder     PAST SURGICAL HISTORY: Past Surgical History:  Procedure Laterality Date   ABDOMINAL HYSTERECTOMY  1979   BREAST EXCISIONAL BIOPSY Left 2005   benign   CATARACT EXTRACTION W/PHACO Left 02/08/2018   Procedure: CATARACT EXTRACTION PHACO AND INTRAOCULAR LENS PLACEMENT ([place]);  Surgeon: Birder Robson, MD;  Location: [facility] ORS;  Service: Ophthalmology;  Laterality: Left;  [place] 00:33.4 AP% 13.9 CDE 4.66 Fluid Pack lot # [id number]   COLONOSCOPY WITH PROPOFOL N/A 07/22/2015   Procedure: COLONOSCOPY WITH PROPOFOL;  Surgeon: Manya Silvas, MD;  Location: [facility] ENDOSCOPY;  Service: Endoscopy;  Laterality: N/A;   COLONOSCOPY WITH PROPOFOL N/A 10/19/2016   Procedure: COLONOSCOPY WITH PROPOFOL;  Surgeon: Manya Silvas, MD;  Location: [facility] ENDOSCOPY;  Service: Endoscopy;  Laterality: N/A;   COLONOSCOPY WITH PROPOFOL N/A 09/18/2020   Procedure: COLONOSCOPY WITH PROPOFOL;  Surgeon: Robert Bellow, MD;  Location: [facility] ENDOSCOPY;  Service: Endoscopy;  Laterality: N/A;   ESOPHAGOGASTRODUODENOSCOPY (EGD) WITH PROPOFOL N/A 09/18/2020   Procedure: ESOPHAGOGASTRODUODENOSCOPY (EGD) WITH PROPOFOL;  Surgeon: Robert Bellow, MD;  Location: [facility] ENDOSCOPY;  Service: Endoscopy;  Laterality: N/A;   JOINT REPLACEMENT     TKR   KNEE SURGERY Left 2011   2007   TONSILLECTOMY  1940   ADENOIDS    FAMILY HISTORY: Family History  Problem Relation Age of Onset   Cancer Mother    Heart disease Father    Cancer Brother    Breast cancer Neg Hx     SOCIAL HISTORY: Social History   Socioeconomic History   Marital status: Widowed    Spouse name: Not on file   Number of children: 2   Years of education: Not on file   Highest education level: High school graduate  Occupational History   Occupation: retired  Tobacco Use   Smoking status: Never   Smokeless tobacco: Never  Vaping Use   Vaping Use: Never used  Substance and Sexual Activity   Alcohol use: Yes    Alcohol/week: 0.0  standard drinks    Comment: wine once every 2 weeks   Drug use: No   Sexual activity: Not on file  Other Topics Concern   Not on file  Social History Narrative   Lives alone   Right handed   Drinks 1 cup caffeine daily   Social Determinants of Health   Financial Resource Strain: Not on file  Food Insecurity: Not on file  Transportation Needs: Not on file  Physical Activity: Not on file  Stress: Not on file  Social Connections: Not on file  Intimate Partner Violence: Not on file     PHYSICAL EXAM  GENERAL EXAM/CONSTITUTIONAL: Vitals:  Vitals:   08/05/21 1251  BP: (!) 172/80  Pulse: 80    There is no height or weight on file to calculate BMI. Wt Readings from Last 3 Encounters:  04/21/21 148 lb  12.8 oz (67.5 kg)  02/05/21 154 lb 8 oz (70.1 kg)  11/26/20 156 lb 11.2 oz (71.1 kg)   Patient is in no distress; well developed, nourished and groomed; neck is supple  CARDIOVASCULAR: Examination of carotid arteries is normal; no carotid bruits Regular rate and rhythm, no murmurs Examination of peripheral vascular system by observation and palpation is normal  EYES: Pupils round and reactive to light, Visual fields full to confrontation, Extraocular movements intacts,   MUSCULOSKELETAL: Gait, strength, tone, movements noted in Neurologic exam below  NEUROLOGIC: MENTAL STATUS:  MMSE - [place] Exam 02/05/2021 07/12/2017  Orientation to time 1 5  Orientation to Place 5 5  Registration 3 3  Attention/ Calculation 4 4  Recall 2 3  Language- name 2 objects 2 2  Language- repeat 1 0  Language- follow 3 step command 3 3  Language- read & follow direction 1 1  Write a sentence 1 1  Copy design 0 0  Total score 23 27      Montreal Cognitive Assessment  08/05/2021  Visuospatial/ Executive (0/5) 1  Naming (0/3) 0  Attention: Read list of digits (0/2) 0  Attention: Read list of letters (0/1) 1  Attention: Serial 7 subtraction starting at 100 (0/3) 0   Language: Repeat phrase (0/2) 1  Language : Fluency (0/1) 1  Abstraction (0/2) 2  Delayed Recall (0/5) 0  Orientation (0/6) 6  Total 12     CRANIAL NERVE:  2nd, 3rd, 4th, 6th - pupils equal and reactive to light, visual fields full to confrontation, extraocular muscles intact, no nystagmus 5th - facial sensation symmetric 7th - facial strength symmetric 8th - hearing intact 9th - palate elevates symmetrically, uvula midline 11th - shoulder shrug symmetric 12th - tongue protrusion midline  MOTOR:  normal bulk and tone, full strength in the BUE, BLE  SENSORY:  normal and symmetric to light touch, pinprick, temperature, vibration  COORDINATION:  finger-nose-finger, fine finger movements normal  REFLEXES:  deep tendon reflexes present and symmetric  GAIT/STATION:  normal    DIAGNOSTIC DATA (LABS, IMAGING, TESTING) - I reviewed patient records, labs, notes, testing and imaging myself where available.  Lab Results  Component Value Date   WBC 8.3 11/11/2020   HGB 13.9 11/11/2020   HCT 39.2 11/11/2020   MCV 86.0 11/11/2020   PLT 197 11/11/2020      Component Value Date/Time   NA 146 (H) 11/26/2020 1600   NA 142 06/02/2014 1953   K 3.4 (L) 11/26/2020 1600   K 2.8 (L) 06/02/2014 1953   CL 104 11/26/2020 1600   CL 105 06/02/2014 1953   CO2 27 11/26/2020 1600   CO2 29 06/02/2014 1953   GLUCOSE 82 11/26/2020 1600   GLUCOSE 150 (H) 11/11/2020 0752   GLUCOSE 133 (H) 06/02/2014 1953   BUN 19 11/26/2020 1600   BUN 20 (H) 06/02/2014 1953   CREATININE 1.05 (H) 11/26/2020 1600   CREATININE 0.98 (H) 05/05/2017 1012   CALCIUM 9.3 11/26/2020 1600   CALCIUM 8.4 (L) 06/02/2014 1953   PROT 6.6 11/11/2020 0752   PROT 7.0 08/26/2020 1513   PROT 7.2 01/12/2013 0838   ALBUMIN 3.9 11/11/2020 0752   ALBUMIN 4.6 08/26/2020 1513   ALBUMIN 3.7 01/12/2013 0838   AST 22 11/11/2020 0752   AST 29 01/12/2013 0838   ALT 15 11/11/2020 0752   ALT 35 01/12/2013 0838   ALKPHOS 60  11/11/2020 0752   ALKPHOS 79 01/12/2013 0838   BILITOT  1.1 11/11/2020 0752   BILITOT 0.4 08/26/2020 1513   BILITOT 0.4 01/12/2013 0838   GFRNONAA >60 11/11/2020 0752   GFRNONAA 54 (L) 05/05/2017 1012   GFRAA 57 (L) 04/17/2020 1123   GFRAA 63 05/05/2017 1012   Lab Results  Component Value Date   CHOL 191 10/29/2020   HDL 48 10/29/2020   LDLCALC 116 (H) 10/29/2020   TRIG 137 10/29/2020   CHOLHDL 4.0 10/29/2020   Lab Results  Component Value Date   HGBA1C 5.2 10/29/2020   No results found for: [id number] Lab Results  Component Value Date   TSH 3.479 12/26/2018    MRI Brain 02/17/2021 1.  Moderate generalized cortical atrophy, a little more than expected for age.  The atrophy is a little more pronounced in the temporal lobes than elsewhere. 2.  Some scattered T2/FLAIR hyperintense foci in the hemispheres and pons consistent with mild chronic microvascular ischemic change, typical for age. 3.  No acute findings.    ASSESSMENT AND PLAN  88 y.o. year old female with anxiety depression, hypertension, hyperlipidemia, CAD, GERD who is presenting for follow-up for mild Alzheimer disease.  At last visit plan was to obtain brain MRI which showed moderate generalized atrophy more pronounced in the temporal lobes, she did not want to start medication at that time.  She presents today, reported her memory is about the same, she still having issues with short-term memory.  Her biggest complaint is hearing loss and son not wanting to get her hearing aids.  On today's exam, her MoCA score was 12 out of 30 showing moderate impairment.  I have explained to the patient that she has mild Alzheimer disease and I think that we should start medication, Aricept.  She is comfortable with plan.  We will start Aricept 10 mg nightly, start with half tab nightly for 2 weeks then increase to full tab.  Discussed side effect of the medication and advised patient to stop the medication and call me if she  experiences any of the side effects including diarrhea, dizziness, vivid dreams.  I will see her in 1 year for follow-up.   1. Mild late onset Alzheimer's dementia without behavioral disturbance, psychotic disturbance, mood disturbance, or anxiety ([place])     Patient Instructions  Start Aricept 10 mg nightly, take 1/2 tab nightly for 2 weeks then increase to full tab. If you experience any side effects include diarrhea, dizziness, please stop the medications and contact me.  Continue your other medications  Recommend getting hearing aid  Return in 1 year    No orders of the defined types were placed in this encounter.   Meds ordered this encounter  Medications   donepezil (ARICEPT) 10 MG tablet    Sig: Take 1 tablet (10 mg total) by mouth at bedtime.    Dispense:  30 tablet    Refill:  11     Return in about 1 year (around 08/05/2022).  I have spent a total of 40 minutes dedicated to this patient today, preparing to see patient, performing a medically appropriate examination and evaluation, ordering tests and/or medications and procedures, and counseling and educating the patient/family/caregiver; independently interpreting result and communicating results to the family/patient/caregiver; and documenting clinical information in the electronic medical record.   Alric Ran, MD 08/05/2021, 1:26 PM  [facility] [address], [place], [place] [address] [phone]

## 2021-08-05 NOTE — Patient Instructions (Signed)
Start Aricept 10 mg nightly, take 1/2 tab nightly for 2 weeks then increase to full tab. If you experience any side effects include diarrhea, dizziness, please stop the medications and contact me.  Continue your other medications  Recommend getting hearing aid  Return in 1 year

## 2021-08-12 ENCOUNTER — Telehealth: Payer: Self-pay | Admitting: Family Medicine

## 2021-08-12 NOTE — Telephone Encounter (Signed)
Copied from [place] [id number]. Topic: Medicare AWV ?>> Aug 12, 2021  2:14 PM Cher Nakai R wrote: ?Reason for CRM:  ?Left message for patient to call back and schedule Medicare Annual Wellness Visit (AWV) in office.  ? ?If not able to come in office, please offer to do virtually or by telephone.  ? ?Last AWV:   03/06/2020 ? ? ?Please schedule at anytime with [facility] Health Advisor. ? ?If any questions, please contact me at [phone] ?

## 2021-08-20 ENCOUNTER — Ambulatory Visit: Payer: PPO

## 2021-08-20 VITALS — Wt 148.0 lb

## 2021-08-21 ENCOUNTER — Telehealth: Payer: Self-pay

## 2021-08-21 NOTE — Progress Notes (Signed)
This encounter was created in error - please disregard.

## 2021-08-21 NOTE — Addendum Note (Signed)
Addended by: Dionisio David on: 08/21/2021 08:30 AM ? ? Modules accepted: Orders, Level of Service ? ?

## 2021-08-21 NOTE — Telephone Encounter (Signed)
Copied from [place] [phone]. Topic: Appointment Scheduling - Scheduling Inquiry for Clinic >> Aug 21, 2021  8:35 AM Greggory Keen D wrote: Reason for CRM: Pts son called saying his mother missed her appt yesterday for the Medicare wellness visit .  He said he thought it was suppose to be a phone call .  He would like to have this rescheduled  CB#  [phone]

## 2021-08-27 ENCOUNTER — Ambulatory Visit (INDEPENDENT_AMBULATORY_CARE_PROVIDER_SITE_OTHER): Payer: PPO

## 2021-08-27 VITALS — Wt 148.0 lb

## 2021-08-27 DIAGNOSIS — Z0000 Encounter for general adult medical examination without abnormal findings: Secondary | ICD-10-CM

## 2021-08-27 NOTE — Patient Instructions (Addendum)
Jordan Gordon , ?Thank you for taking time to come for your Medicare Wellness Visit. I appreciate your ongoing commitment to your health goals. Please review the following plan we discussed and let me know if I can assist you in the future.  ? ?Screening recommendations/referrals: ?Colonoscopy: aged out ?Mammogram: aged out ?Bone Density: aged out ?Recommended yearly ophthalmology/optometry visit for glaucoma screening and checkup ?Recommended yearly dental visit for hygiene and checkup ? ?Vaccinations: ?Influenza vaccine: 04/21/21 ?Pneumococcal vaccine: 04/18/15 ?Tdap vaccine: 03/30/18 ?Shingles vaccine: Zostavax 03/24/12   ?Covid-19:04/17/20 ? ?Advanced directives: no ? ?Conditions/risks identified: none ? ?Next appointment: Follow up in one year for your annual wellness visit - 09/01/22 @ 11am by phone ? ? ?Preventive Care 44 Years and Older, Female ?Preventive care refers to lifestyle choices and visits with your health care provider that can promote health and wellness. ?What does preventive care include? ?A yearly physical exam. This is also called an annual well check. ?Dental exams once or twice a year. ?Routine eye exams. Ask your health care provider how often you should have your eyes checked. ?Personal lifestyle choices, including: ?Daily care of your teeth and gums. ?Regular physical activity. ?Eating a healthy diet. ?Avoiding tobacco and drug use. ?Limiting alcohol use. ?Practicing safe sex. ?Taking low-dose aspirin every day. ?Taking vitamin and mineral supplements as recommended by your health care provider. ?What happens during an annual well check? ?The services and screenings done by your health care provider during your annual well check will depend on your age, overall health, lifestyle risk factors, and family history of disease. ?Counseling  ?Your health care provider Maricle ask you questions about your: ?Alcohol use. ?Tobacco use. ?Drug use. ?Emotional well-being. ?Home and relationship  well-being. ?Sexual activity. ?Eating habits. ?History of falls. ?Memory and ability to understand (cognition). ?Work and work Statistician. ?Reproductive health. ?Screening  ?You Phung have the following tests or measurements: ?Height, weight, and BMI. ?Blood pressure. ?Lipid and cholesterol levels. These Duer be checked every 5 years, or more frequently if you are over 75 years old. ?Skin check. ?Lung cancer screening. You Bajorek have this screening every year starting at age 34 if you have a 30-pack-year history of smoking and currently smoke or have quit within the past 15 years. ?Fecal occult blood test (FOBT) of the stool. You Gongora have this test every year starting at age 28. ?Flexible sigmoidoscopy or colonoscopy. You Begnaud have a sigmoidoscopy every 5 years or a colonoscopy every 10 years starting at age 32. ?Hepatitis C blood test. ?Hepatitis B blood test. ?Sexually transmitted disease (STD) testing. ?Diabetes screening. This is done by checking your blood sugar (glucose) after you have not eaten for a while (fasting). You Nair have this done every 1-3 years. ?Bone density scan. This is done to screen for osteoporosis. You Coffelt have this done starting at age 61. ?Mammogram. This Bunt be done every 1-2 years. Talk to your health care provider about how often you should have regular mammograms. ?Talk with your health care provider about your test results, treatment options, and if necessary, the need for more tests. ?Vaccines  ?Your health care provider Calderone recommend certain vaccines, such as: ?Influenza vaccine. This is recommended every year. ?Tetanus, diphtheria, and acellular pertussis (Tdap, Td) vaccine. You Kau need a Td booster every 10 years. ?Zoster vaccine. You [place]berry need this after age 26. ?Pneumococcal 13-valent conjugate (PCV13) vaccine. One dose is recommended after age 78. ?Pneumococcal polysaccharide (PPSV23) vaccine. One dose is recommended after age 65. ?Talk to your  health care provider about which  screenings and vaccines you need and how often you need them. ?This information is not intended to replace advice given to you by your health care provider. Make sure you discuss any questions you have with your health care provider. ?Document Released: 06/21/2015 Document Revised: 02/12/2016 Document Reviewed: 03/26/2015 ?Elsevier Interactive Patient Education ? 2017 [place]. ? ?Fall Prevention in the Home ?Falls can cause injuries. They can happen to people of all ages. There are many things you can do to make your home safe and to help prevent falls. ?What can I do on the outside of my home? ?Regularly fix the edges of walkways and driveways and fix any cracks. ?Remove anything that might make you trip as you walk through a door, such as a raised step or threshold. ?Trim any bushes or trees on the path to your home. ?Use bright outdoor lighting. ?Clear any walking paths of anything that might make someone trip, such as rocks or tools. ?Regularly check to see if handrails are loose or broken. Make sure that both sides of any steps have handrails. ?Any raised decks and porches should have guardrails on the edges. ?Have any leaves, snow, or ice cleared regularly. ?Use sand or salt on walking paths during winter. ?Clean up any spills in your garage right away. This includes oil or grease spills. ?What can I do in the bathroom? ?Use night lights. ?Install grab bars by the toilet and in the tub and shower. Do not use towel bars as grab bars. ?Use non-skid mats or decals in the tub or shower. ?If you need to sit down in the shower, use a plastic, non-slip stool. ?Keep the floor dry. Clean up any water that spills on the floor as soon as it happens. ?Remove soap buildup in the tub or shower regularly. ?Attach bath mats securely with double-sided non-slip rug tape. ?Do not have throw rugs and other things on the floor that can make you trip. ?What can I do in the bedroom? ?Use night lights. ?Make sure that you have a  light by your bed that is easy to reach. ?Do not use any sheets or blankets that are too big for your bed. They should not hang down onto the floor. ?Have a firm chair that has side arms. You can use this for support while you get dressed. ?Do not have throw rugs and other things on the floor that can make you trip. ?What can I do in the kitchen? ?Clean up any spills right away. ?Avoid walking on wet floors. ?Keep items that you use a lot in easy-to-reach places. ?If you need to reach something above you, use a strong step stool that has a grab bar. ?Keep electrical cords out of the way. ?Do not use floor polish or wax that makes floors slippery. If you must use wax, use non-skid floor wax. ?Do not have throw rugs and other things on the floor that can make you trip. ?What can I do with my stairs? ?Do not leave any items on the stairs. ?Make sure that there are handrails on both sides of the stairs and use them. Fix handrails that are broken or loose. Make sure that handrails are as long as the stairways. ?Check any carpeting to make sure that it is firmly attached to the stairs. Fix any carpet that is loose or worn. ?Avoid having throw rugs at the top or bottom of the stairs. If you do have throw rugs, attach them  to the floor with carpet tape. ?Make sure that you have a light switch at the top of the stairs and the bottom of the stairs. If you do not have them, ask someone to add them for you. ?What else can I do to help prevent falls? ?Wear shoes that: ?Do not have high heels. ?Have rubber bottoms. ?Are comfortable and fit you well. ?Are closed at the toe. Do not wear sandals. ?If you use a stepladder: ?Make sure that it is fully opened. Do not climb a closed stepladder. ?Make sure that both sides of the stepladder are locked into place. ?Ask someone to hold it for you, if possible. ?Clearly mark and make sure that you can see: ?Any grab bars or handrails. ?First and last steps. ?Where the edge of each step  is. ?Use tools that help you move around (mobility aids) if they are needed. These include: ?Canes. ?Walkers. ?Scooters. ?Crutches. ?Turn on the lights when you go into a dark area. Replace any light bulbs as soon as they bu

## 2021-08-27 NOTE — Progress Notes (Signed)
?Virtual Visit via Telephone Note ? ?I connected with  Jordan Gordon on 08/27/21 at  1:40 PM EDT by telephone and verified that I am speaking with the correct person using two identifiers. ? ?Location: ?Patient: home ?Provider: [facility] ?Persons participating in the virtual visit: patient/Nurse Health Advisor ?  ?I discussed the limitations, risks, security and privacy concerns of performing an evaluation and management service by telephone and the availability of in person appointments. The patient expressed understanding and agreed to proceed. ? ?Interactive audio and video telecommunications were attempted between this nurse and patient, however failed, due to patient having technical difficulties OR patient did not have access to video capability.  We continued and completed visit with audio only. ? ?Some vital signs [place] be absent or patient reported.  ? ?Dionisio David, LPN ? ?Subjective:  ? Jordan Gordon is a 88 y.o. female who presents for Medicare Annual (Subsequent) preventive examination. ? ?Review of Systems    ? ?  ? ?   ?Objective:  ?  ?There were no vitals filed for this visit. ?There is no height or weight on file to calculate BMI. ? ? ?  11/11/2020  ?  7:48 AM 10/28/2020  ?  8:35 AM 10/24/2020  ?  8:10 AM 09/18/2020  ?  6:48 AM 03/06/2020  ?  1:28 PM 01/13/2019  ?  3:37 PM 12/26/2018  ? 10:56 AM  ?Advanced Directives  ?Does Patient Have a Medical Advance Directive? No No No No Yes No No  ?Type of Production manager of [place];Living will    ?Does patient want to make changes to medical advance directive?  No - Patient declined No - Patient declined      ?Copy of [place] in Chart?     No - copy requested    ?Would patient like information on creating a medical advance directive?      No - Patient declined   ? ? ?Current Medications (verified) ?Outpatient Encounter Medications as of 08/27/2021  ?Medication Sig  ? ALPRAZolam (XANAX) 0.25 MG tablet TAKE ONE TABLET BY MOUTH 3  TIMES DAILY AS NEEDED FOR ANXIETY  ? amLODipine (NORVASC) 10 MG tablet TAKE 1 TABLET BY MOUTH DAILY.  ? amLODipine (NORVASC) 2.5 MG tablet   ? donepezil (ARICEPT) 10 MG tablet Take 1 tablet (10 mg total) by mouth at bedtime.  ? dorzolamide-timolol (COSOPT) 22.3-6.8 MG/ML ophthalmic solution 1 drop 2 (two) times daily.  ? losartan (COZAAR) 100 MG tablet Take 1 tablet (100 mg total) by mouth daily.  ? Netarsudil-Latanoprost (ROCKLATAN) 0.02-0.005 % SOLN   ? sertraline (ZOLOFT) 100 MG tablet Take 1 tablet (100 mg total) by mouth daily.  ? traZODone (DESYREL) 100 MG tablet Take 1 tablet by mouth at bedtime.  ? traZODone (DESYREL) 150 MG tablet Take 1 tablet (150 mg total) by mouth at bedtime.  ? triamterene-hydrochlorothiazide (MAXZIDE-25) 37.5-25 MG tablet Take 1 tablet by mouth daily.  ? ?No facility-administered encounter medications on file as of 08/27/2021.  ? ? ?Allergies (verified) ?Codeine  ? ?History: ?Past Medical History:  ?Diagnosis Date  ? Anxiety   ? Coronary artery disease   ? GERD (gastroesophageal reflux disease)   ? HOH (hard of hearing)   ? Hyperlipidemia   ? Hypertension   ? Major depressive disorder   ? ?Past Surgical History:  ?Procedure Laterality Date  ? ABDOMINAL HYSTERECTOMY  1979  ? BREAST EXCISIONAL BIOPSY Left 2005  ? benign  ?  CATARACT EXTRACTION W/PHACO Left 02/08/2018  ? Procedure: CATARACT EXTRACTION PHACO AND INTRAOCULAR LENS PLACEMENT (IOC);  Surgeon: Birder Robson, MD;  Location: [facility] ORS;  Service: Ophthalmology;  Laterality: Left;  [place] 00:33.4 ?AP% 13.9 ?CDE 4.66 ?Fluid Pack lot # [id number]  ? COLONOSCOPY WITH PROPOFOL N/A 07/22/2015  ? Procedure: COLONOSCOPY WITH PROPOFOL;  Surgeon: Manya Silvas, MD;  Location: [facility] ENDOSCOPY;  Service: Endoscopy;  Laterality: N/A;  ? COLONOSCOPY WITH PROPOFOL N/A 10/19/2016  ? Procedure: COLONOSCOPY WITH PROPOFOL;  Surgeon: Manya Silvas, MD;  Location: [facility] ENDOSCOPY;  Service: Endoscopy;  Laterality: N/A;  ? COLONOSCOPY WITH PROPOFOL N/A  09/18/2020  ? Procedure: COLONOSCOPY WITH PROPOFOL;  Surgeon: Robert Bellow, MD;  Location: [facility] ENDOSCOPY;  Service: Endoscopy;  Laterality: N/A;  ? ESOPHAGOGASTRODUODENOSCOPY (EGD) WITH PROPOFOL N/A 09/18/2020  ? Procedure: ESOPHAGOGASTRODUODENOSCOPY (EGD) WITH PROPOFOL;  Surgeon: Robert Bellow, MD;  Location: [facility] ENDOSCOPY;  Service: Endoscopy;  Laterality: N/A;  ? JOINT REPLACEMENT    ? TKR  ? KNEE SURGERY Left 2011  ? 2007  ? TONSILLECTOMY  1940  ? ADENOIDS  ? ?Family History  ?Problem Relation Age of Onset  ? Cancer Mother   ? Heart disease Father   ? Cancer Brother   ? Breast cancer Neg Hx   ? ?Social History  ? ?Socioeconomic History  ? Marital status: Widowed  ?  Spouse name: Not on file  ? Number of children: 2  ? Years of education: Not on file  ? Highest education level: High school graduate  ?Occupational History  ? Occupation: retired  ?Tobacco Use  ? Smoking status: Never  ? Smokeless tobacco: Never  ?Vaping Use  ? Vaping Use: Never used  ?Substance and Sexual Activity  ? Alcohol use: Yes  ?  Alcohol/week: 0.0 standard drinks  ?  Comment: wine once every 2 weeks  ? Drug use: No  ? Sexual activity: Not on file  ?Other Topics Concern  ? Not on file  ?Social History Narrative  ? Lives alone  ? Right handed  ? Drinks 1 cup caffeine daily  ? ?Social Determinants of Health  ? ?Financial Resource Strain: Not on file  ?Food Insecurity: Not on file  ?Transportation Needs: Not on file  ?Physical Activity: Not on file  ?Stress: Not on file  ?Social Connections: Not on file  ? ? ?Tobacco Counseling ?Counseling given: Not Answered ? ? ?Clinical Intake: ? ?Pre-visit preparation completed: Yes ? ?Pain : No/denies pain ? ?  ? ?Nutritional Risks: None ?Diabetes: No ? ?How often do you need to have someone help you when you read instructions, pamphlets, or other written materials from your doctor or pharmacy?: 1 - Never ? ?Diabetic?no ? ?Interpreter Needed?: No ? ?Information entered by :: Kirke Shaggy,  LPN ? ? ?Activities of Daily Living ? ?  11/14/2020  ?  3:05 PM 10/28/2020  ?  5:54 PM  ?In your present state of health, do you have any difficulty performing the following activities:  ?Hearing? 1   ?Vision? 1   ?Difficulty concentrating or making decisions? 1   ?Walking or climbing stairs? 1   ?Dressing or bathing? 0   ?Doing errands, shopping? 1 0  ? ? ?Patient Care Team: ?Virginia Crews, MD as PCP - General (Family Medicine) ?Birder Robson, MD as Referring Physician (Ophthalmology) ? ?Indicate any recent Medical Services you Pollino have received from other than [facility] providers in the past year (date Nicole be approximate). ? ?   ?Assessment:  ?  This is a routine wellness examination for Jordan Gordon. ? ?Hearing/Vision screen ?No results found. ? ?Dietary issues and exercise activities discussed: ?  ? ? Goals Addressed   ?None ?  ? ?Depression Screen ? ?  04/21/2021  ?  9:25 AM 11/26/2020  ?  3:33 PM 11/14/2020  ?  3:05 PM 02/19/2020  ?  2:13 PM 08/17/2019  ?  6:12 PM 11/28/2018  ? 10:49 AM 05/16/2018  ?  8:42 AM  ?PHQ 2/9 Scores  ?PHQ - 2 Score '1 5 6 '$ 0 6 4 0  ?PHQ- 9 Score '3 13 26 '$ 0 '9 11 7  '$ ?  ?Fall Risk ? ?  03/06/2020  ?  1:28 PM 02/19/2020  ?  2:15 PM 11/28/2018  ? 10:47 AM 01/20/2018  ?  1:25 PM 12/28/2017  ? 12:37 PM  ?Fall Risk   ?Falls in the past year? 1 0 1 No No  ?Comment   she fell 2 weeks ago. Patient was standing on a chair cleaning cabinets and chair moved and she fell.    ?Number falls in past yr: 0 0 1    ?Injury with Fall? 0 0 1    ?Comment   hurt the back of her head and reports that it was bleeding. She went to the ED    ?Follow up Falls prevention discussed Falls evaluation completed     ? ? ?FALL RISK PREVENTION PERTAINING TO THE HOME: ? ?Any stairs in or around the home? No  ?If so, are there any without handrails? No  ?Home free of loose throw rugs in walkways, pet beds, electrical cords, etc? Yes  ?Adequate lighting in your home to reduce risk of falls? Yes  ? ?ASSISTIVE DEVICES UTILIZED TO PREVENT  FALLS: ? ?Life alert? No  ?Use of a cane, walker or w/c? No  ?Grab bars in the bathroom? Yes  ?Shower chair or bench in shower? No  ?Elevated toilet seat or a handicapped toilet? Yes  ? ? ?Cognitive Function: ? ?  8/31

## 2021-10-14 ENCOUNTER — Other Ambulatory Visit: Payer: Self-pay | Admitting: Family Medicine

## 2021-10-14 DIAGNOSIS — F419 Anxiety disorder, unspecified: Secondary | ICD-10-CM

## 2021-10-14 NOTE — Telephone Encounter (Signed)
Medication Refill - Medication: ALPRAZolam (XANAX) 0.25 MG tablet ? ?Has the patient contacted their pharmacy? Yes.   ?(Agent: If no, request that the patient contact the pharmacy for the refill. If patient does not wish to contact the pharmacy document the reason why and proceed with request.) ?(Agent: If yes, when and what did the pharmacy advise?) ? ?Preferred Pharmacy (with phone number or street name):  ?[facility] - [place], [place] - [address]  ?[place] [address]  ?Phone: [phone] Fax: [phone]  ? ?Has the patient been seen for an appointment in the last year OR does the patient have an upcoming appointment? Yes.   ? ?Agent: Please be advised that RX refills [place] take up to 3 business days. We ask that you follow-up with your pharmacy. ? ?

## 2021-10-15 NOTE — Telephone Encounter (Signed)
Requested medication (s) are due for refill today: yes ? ?Requested medication (s) are on the active medication list: yes ? ?Last refill:  04/21/22 #90/0 ? ?Future visit scheduled: no ? ?Notes to clinic:  Unable to refill per protocol, cannot delegate. ? ?  ?Requested Prescriptions  ?Pending Prescriptions Disp Refills  ? ALPRAZolam (XANAX) 0.25 MG tablet 90 tablet 0  ?  Sig: TAKE ONE TABLET BY MOUTH 3 TIMES DAILY AS NEEDED FOR ANXIETY  ?  ? Not Delegated - Psychiatry: Anxiolytics/Hypnotics 2 Failed - 10/15/2021  1:06 PM  ?  ?  Failed - This refill cannot be delegated  ?  ?  Failed - Urine Drug Screen completed in last 360 days  ?  ?  Passed - Patient is not pregnant  ?  ?  Passed - Valid encounter within last 6 months  ?  Recent Outpatient Visits   ? ?      ? 5 months ago Hypertensive urgency  ? [facility] Gwyneth Sprout, FNP  ? 5 months ago Need for influenza vaccination  ? [facility] Gwyneth Sprout, FNP  ? 10 months ago Essential (primary) hypertension  ? [facility] [place], Dionne Bucy, MD  ? 11 months ago Hypertensive urgency  ? [place], PA-C  ? 1 year ago Chronic idiopathic constipation  ? [facility] [place], Anderson Malta M, [place]  ? ?  ?  ? ? ?  ?  ?  ? ?

## 2021-10-17 MED ORDER — ALPRAZOLAM 0.25 MG PO TABS: 0.2500 mg | ORAL_TABLET | ORAL | 0 refills | Status: DC | PRN

## 2022-01-30 ENCOUNTER — Other Ambulatory Visit: Payer: Self-pay | Admitting: Family Medicine

## 2022-01-30 DIAGNOSIS — I160 Hypertensive urgency: Secondary | ICD-10-CM

## 2022-01-30 NOTE — Telephone Encounter (Signed)
Requested Prescriptions  Pending Prescriptions Disp Refills  . amLODipine (NORVASC) 10 MG tablet [Pharmacy Med Name: AMLODIPINE BESYLATE 10 MG TAB] 90 tablet 0    Sig: TAKE 1 TABLET BY MOUTH DAILY.     Cardiovascular: Calcium Channel Blockers 2 Failed - 01/30/2022  1:28 PM      Failed - Last BP in normal range    BP Readings from Last 1 Encounters:  08/05/21 (!) 172/80         Passed - Last Heart Rate in normal range    Pulse Readings from Last 1 Encounters:  08/05/21 80         Passed - Valid encounter within last 6 months    Recent Outpatient Visits          9 months ago Hypertensive urgency   [facility] Gwyneth Sprout, FNP   9 months ago Need for influenza vaccination   [facility] Gwyneth Sprout, FNP   1 year ago Essential (primary) hypertension   [facility], Dionne [facility], MD   1 year ago Hypertensive urgency   [place], Vickki Muff, PA-C   1 year ago Chronic idiopathic constipation   [facility], [place], [place]

## 2022-02-02 ENCOUNTER — Other Ambulatory Visit: Payer: Self-pay | Admitting: Family Medicine

## 2022-02-19 ENCOUNTER — Other Ambulatory Visit: Payer: Self-pay | Admitting: Family Medicine

## 2022-02-19 DIAGNOSIS — F419 Anxiety disorder, unspecified: Secondary | ICD-10-CM

## 2022-04-06 ENCOUNTER — Other Ambulatory Visit: Payer: Self-pay | Admitting: Family Medicine

## 2022-04-06 DIAGNOSIS — F419 Anxiety disorder, unspecified: Secondary | ICD-10-CM

## 2022-04-07 NOTE — Telephone Encounter (Signed)
Requested medication (s) are due for refill today: yes  Requested medication (s) are on the active medication list: yes  Last refill:  02/20/22 #30 with 0 RF  Future visit scheduled: no, last seen 04/29/21  Notes to clinic:  This medication can not be delegated, please assess.        Requested Prescriptions  Pending Prescriptions Disp Refills   ALPRAZolam (XANAX) 0.25 MG tablet [Pharmacy Med Name: ALPRAZOLAM 0.25 MG TAB] 30 tablet     Sig: TAKE 1 TABLET BY MOUTH AS NEEDED FOR ANXIETY     There is no refill protocol information for this order

## 2022-04-09 ENCOUNTER — Other Ambulatory Visit: Payer: Self-pay | Admitting: Family Medicine

## 2022-04-09 DIAGNOSIS — F419 Anxiety disorder, unspecified: Secondary | ICD-10-CM

## 2022-04-09 MED ORDER — ALPRAZOLAM 0.25 MG PO TABS: 0.2500 mg | ORAL_TABLET | Freq: Every day | ORAL | 0 refills | Status: DC | PRN

## 2022-04-09 NOTE — Telephone Encounter (Signed)
Requested medication (s) are due for refill todayyes  Requested medication (s) are on the active medication list: yes  Last refill:  02/20/22  Future visit scheduled:yes  Notes to clinic:  Unable to refill per protocol, cannot delegate.      Requested Prescriptions  Pending Prescriptions Disp Refills   ALPRAZolam (XANAX) 0.25 MG tablet [Pharmacy Med Name: ALPRAZOLAM 0.25 MG TAB] 30 tablet     Sig: TAKE 1 TABLET BY MOUTH AS NEEDED FOR ANXIETY     There is no refill protocol information for this order

## 2022-04-09 NOTE — Telephone Encounter (Signed)
Requested medication (s) are due for refill today: no  Requested medication (s) are on the active medication list: yes    Last refill: 04/09/22  #30 0 refills  Future visit scheduled no  Notes to clinic:Refill of today. Cannot refuse non-delegated meds per protocol..  Requested Prescriptions  Pending Prescriptions Disp Refills   ALPRAZolam (XANAX) 0.25 MG tablet [Pharmacy Med Name: ALPRAZOLAM 0.25 MG TAB] 30 tablet     Sig: TAKE 1 TABLET BY MOUTH DAILY AS NEEDED FOR ANXIETY. COURTESY REFILL. DUE FOR INOFFICE / VIDEO FOLLOW UP     Not Delegated - Psychiatry: Anxiolytics/Hypnotics 2 Failed - 04/09/2022  5:37 PM      Failed - This refill cannot be delegated      Failed - Urine Drug Screen completed in last 360 days      Failed - Valid encounter within last 6 months    Recent Outpatient Visits           11 months ago Hypertensive urgency   [facility] Gwyneth Sprout, FNP   11 months ago Need for influenza vaccination   [facility] Gwyneth Sprout, FNP   1 year ago Essential (primary) hypertension   [facility], Dionne Bucy, MD   1 year ago Hypertensive urgency   [place], Vickki Muff, PA-C   1 year ago Chronic idiopathic constipation   [facility], [place], [place] - Patient is not pregnant

## 2022-05-07 ENCOUNTER — Other Ambulatory Visit: Payer: Self-pay | Admitting: Family Medicine

## 2022-05-07 DIAGNOSIS — I10 Essential (primary) hypertension: Secondary | ICD-10-CM

## 2022-05-07 DIAGNOSIS — I160 Hypertensive urgency: Secondary | ICD-10-CM

## 2022-05-07 NOTE — Telephone Encounter (Signed)
Courtesy refill given, appointment needed.   Requested Prescriptions  Pending Prescriptions Disp Refills   amLODipine (NORVASC) 10 MG tablet [Pharmacy Med Name: AMLODIPINE BESYLATE 10 MG TAB] 30 tablet 0    Sig: Take 1 tablet (10 mg total) by mouth daily. OFFICE VISIT NEEDED FOR ADDITIONAL REFILLS     Cardiovascular: Calcium Channel Blockers 2 Failed - 05/07/2022 12:44 PM      Failed - Last BP in normal range    BP Readings from Last 1 Encounters:  08/05/21 (!) 172/80         Failed - Valid encounter within last 6 months    Recent Outpatient Visits           1 year ago Hypertensive urgency   [facility] Gwyneth Sprout, FNP   1 year ago Need for influenza vaccination   [facility] Gwyneth Sprout, FNP   1 year ago Essential (primary) hypertension   [facility], Dionne Bucy, MD   1 year ago Hypertensive urgency   [place], Vickki Muff, PA-C   1 year ago Chronic idiopathic constipation   [place], [place]       Future Appointments             In 1 week Gwyneth Sprout, [place], [facility]            Passed - Last Heart Rate in normal range    Pulse Readings from Last 1 Encounters:  08/05/21 80

## 2022-05-07 NOTE — Telephone Encounter (Signed)
Patient called and advised she will need an appointment, last visit 04/29/21. She agreed and asked for 2 weeks out. CPE scheduled for 05/18/22.

## 2022-05-07 NOTE — Telephone Encounter (Signed)
Requested medication (s) are due for refill today: Yes  Requested medication (s) are on the active medication list: Yes  Last refill:  04/21/21  Future visit scheduled: Yes  Notes to clinic:  Unable to refill per protocol due to failed labs, no updated results.      Requested Prescriptions  Pending Prescriptions Disp Refills   sertraline (ZOLOFT) 100 MG tablet [Pharmacy Med Name: SERTRALINE HCL 100 MG TAB] 90 tablet 3    Sig: TAKE 1 TABLET BY MOUTH DAILY     Psychiatry:  Antidepressants - SSRI - sertraline Failed - 05/07/2022 12:26 PM      Failed - AST in normal range and within 360 days    AST  Date Value Ref Range Status  11/11/2020 22 15 - 41 U/L Final   SGOT(AST)  Date Value Ref Range Status  01/12/2013 29 15 - 37 Unit/L Final         Failed - ALT in normal range and within 360 days    ALT  Date Value Ref Range Status  11/11/2020 15 0 - 44 U/L Final   SGPT (ALT)  Date Value Ref Range Status  01/12/2013 35 12 - 78 U/L Final         Failed - Valid encounter within last 6 months    Recent Outpatient Visits           1 year ago Hypertensive urgency   [facility] Gwyneth Sprout, FNP   1 year ago Need for influenza vaccination   [facility] Gwyneth Sprout, FNP   1 year ago Essential (primary) hypertension   [facility] [place], Dionne Bucy, MD   1 year ago Hypertensive urgency   [place], Vickki Muff, PA-C   1 year ago Chronic idiopathic constipation   [place], [place]       Future Appointments             In 1 week Gwyneth Sprout, [place], [place] - Completed PHQ-2 or PHQ-9 in the last 360 days

## 2022-05-07 NOTE — Telephone Encounter (Signed)
Requested medication (s) are due for refill today: expired medication 04/21/22  Requested medication (s) are on the active medication list: yes   Last refill:  04/21/21 #90 3 refills  Future visit scheduled: yes in 1 week   Notes to clinic:  expired medication. Do you want to renew Rx? Last labs 11/26/20     Requested Prescriptions  Pending Prescriptions Disp Refills   losartan (COZAAR) 100 MG tablet [Pharmacy Med Name: LOSARTAN POTASSIUM 100 MG TAB] 90 tablet 3    Sig: TAKE 1 TABLET BY MOUTH DAILY     Cardiovascular:  Angiotensin Receptor Blockers Failed - 05/07/2022 12:07 PM      Failed - Cr in normal range and within 180 days    Creat  Date Value Ref Range Status  05/05/2017 0.98 (H) 0.60 - 0.88 mg/dL Final    Comment:    For patients >64 years of age, the reference limit for Creatinine is approximately 13% higher for people identified as African-American. .    Creatinine, Ser  Date Value Ref Range Status  11/26/2020 1.05 (H) 0.57 - 1.00 mg/dL Final         Failed - K in normal range and within 180 days    Potassium  Date Value Ref Range Status  11/26/2020 3.4 (L) 3.5 - 5.2 mmol/L Final  06/02/2014 2.8 (L) 3.5 - 5.1 mmol/L Final         Failed - Last BP in normal range    BP Readings from Last 1 Encounters:  08/05/21 (!) 172/80         Failed - Valid encounter within last 6 months    Recent Outpatient Visits           1 year ago Hypertensive urgency   [facility] Gwyneth Sprout, FNP   1 year ago Need for influenza vaccination   [facility] Gwyneth Sprout, FNP   1 year ago Essential (primary) hypertension   [facility] [place], Dionne Bucy, MD   1 year ago Hypertensive urgency   [place], Vickki Muff, PA-C   1 year ago Chronic idiopathic constipation   [place], [place], [place]       Future Appointments             In 1 week Gwyneth Sprout, [place], [place] - Patient is not pregnant

## 2022-05-11 ENCOUNTER — Other Ambulatory Visit: Payer: Self-pay | Admitting: Family Medicine

## 2022-05-11 DIAGNOSIS — F5104 Psychophysiologic insomnia: Secondary | ICD-10-CM

## 2022-05-12 NOTE — Telephone Encounter (Signed)
Requested medications are due for refill today.  yes  Requested medications are on the active medications list.  yes  Last refill. 04/21/2021 #90 3 rf  Future visit scheduled.   yes  Notes to clinic.  PT more than 3 months overdue for OV.    Requested Prescriptions  Pending Prescriptions Disp Refills   traZODone (DESYREL) 150 MG tablet [Pharmacy Med Name: TRAZODONE HCL 150 MG TAB] 90 tablet 3    Sig: TAKE ONE TABLET BY MOUTH AT BEDTIME     Psychiatry: Antidepressants - Serotonin Modulator Failed - 05/11/2022 11:20 AM      Failed - Valid encounter within last 6 months    Recent Outpatient Visits           1 year ago Hypertensive urgency   [facility] Gwyneth Sprout, FNP   1 year ago Need for influenza vaccination   [facility] Gwyneth Sprout, FNP   1 year ago Essential (primary) hypertension   [facility], Dionne Bucy, MD   1 year ago Hypertensive urgency   [place], Vickki Muff, PA-C   1 year ago Chronic idiopathic constipation   [place], [place]       Future Appointments             In 6 days Gwyneth Sprout, [place], [place] - Completed PHQ-2 or PHQ-9 in the last 360 days

## 2022-05-18 ENCOUNTER — Encounter: Payer: PPO | Admitting: Family Medicine

## 2022-05-21 ENCOUNTER — Encounter: Payer: PPO | Admitting: Family Medicine

## 2022-06-11 ENCOUNTER — Other Ambulatory Visit: Payer: Self-pay | Admitting: Family Medicine

## 2022-06-11 DIAGNOSIS — I160 Hypertensive urgency: Secondary | ICD-10-CM

## 2022-06-11 NOTE — Telephone Encounter (Signed)
Requested medication (s) are due for refill today:   Yes  Requested medication (s) are on the active medication list:   Yes  Future visit scheduled:   Yes 06/25/2022 with Jordan Gordon  (I notice she has cancelled her last several appts.   LOV 04/29/2021)   Last ordered: 05/07/2022 #30, 0 refills as a courtesy refill  Returned for provider to review for refills prior to upcoming appt.       Requested Prescriptions  Pending Prescriptions Disp Refills   amLODipine (NORVASC) 10 MG tablet [Pharmacy Med Name: AMLODIPINE BESYLATE 10 MG TAB] 30 tablet 0    Sig: TAKE 1 TABLET BY MOUTH DAILY.     Cardiovascular: Calcium Channel Blockers 2 Failed - 06/11/2022 12:44 PM      Failed - Last BP in normal range    BP Readings from Last 1 Encounters:  08/05/21 (!) 172/80         Failed - Valid encounter within last 6 months    Recent Outpatient Visits           1 year ago Hypertensive urgency   [facility] Gwyneth Sprout, FNP   1 year ago Need for influenza vaccination   [facility] Gwyneth Sprout, FNP   1 year ago Essential (primary) hypertension   [facility], Dionne Bucy, MD   1 year ago Hypertensive urgency   [place], Vickki Muff, PA-C   1 year ago Chronic idiopathic constipation   [place], [place]       Future Appointments             In 2 weeks Gwyneth Sprout, [place], [facility]            Passed - Last Heart Rate in normal range    Pulse Readings from Last 1 Encounters:  08/05/21 80

## 2022-06-17 ENCOUNTER — Telehealth: Payer: Self-pay | Admitting: Neurology

## 2022-06-17 NOTE — Telephone Encounter (Signed)
LVM on cell, unable to reach on home, sent mychart msg advising pt of r/s needed for 2/28 appt- MD out.

## 2022-06-24 ENCOUNTER — Other Ambulatory Visit: Payer: Self-pay | Admitting: Family Medicine

## 2022-06-24 DIAGNOSIS — I160 Hypertensive urgency: Secondary | ICD-10-CM

## 2022-06-24 DIAGNOSIS — F419 Anxiety disorder, unspecified: Secondary | ICD-10-CM

## 2022-06-24 NOTE — Telephone Encounter (Signed)
Requested medication (s) are due for refill today: Yes  Requested medication (s) are on the active medication list: Yes  Last refill:  05/07/22 courtesy refill  Future visit scheduled: Yes  Notes to clinic:  Unable to refill per protocol, cannot delegate, appointment needed, failed labs, no updated results. Son is requesting a short supply until appointment due to patient is out of meds.      Requested Prescriptions  Pending Prescriptions Disp Refills   ALPRAZolam (XANAX) 0.25 MG tablet 30 tablet 0    Sig: Take 1 tablet (0.25 mg total) by mouth daily as needed for anxiety. Courtesy refill; due for in office/video follow up     Not Delegated - Psychiatry: Anxiolytics/Hypnotics 2 Failed - 06/24/2022 10:44 AM      Failed - This refill cannot be delegated      Failed - Urine Drug Screen completed in last 360 days      Failed - Valid encounter within last 6 months    Recent Outpatient Visits           1 year ago Hypertensive urgency   [facility] Gwyneth Sprout, FNP   1 year ago Need for influenza vaccination   [facility] Gwyneth Sprout, FNP   1 year ago Essential (primary) hypertension   [facility], Dionne Bucy, MD   1 year ago Hypertensive urgency   [place], Vickki Muff, PA-C   1 year ago Chronic idiopathic constipation   [place], Clearnce Sorrel, [place]       Future Appointments             In 5 days Gwyneth Sprout, [place], [place] - Patient is not pregnant       amLODipine (NORVASC) 10 MG tablet 30 tablet 0    Sig: Take 1 tablet (10 mg total) by mouth daily. OFFICE VISIT NEEDED FOR ADDITIONAL REFILLS     Cardiovascular: Calcium Channel Blockers 2 Failed - 06/24/2022 10:44 AM      Failed - Last BP in normal range    BP Readings from Last 1 Encounters:  08/05/21 (!) 172/80         Failed - Valid encounter within last 6  months    Recent Outpatient Visits           1 year ago Hypertensive urgency   [facility] Gwyneth Sprout, FNP   1 year ago Need for influenza vaccination   [facility] Gwyneth Sprout, FNP   1 year ago Essential (primary) hypertension   [facility], Dionne Bucy, MD   1 year ago Hypertensive urgency   [place], Vickki Muff, PA-C   1 year ago Chronic idiopathic constipation   [place], [place]       Future Appointments             In 5 days Gwyneth Sprout, [place], [facility]            Passed - Last Heart Rate in normal range    Pulse Readings from Last 1 Encounters:  08/05/21 80          sertraline (ZOLOFT) 100 MG tablet 30 tablet 0    Sig: Take 1 tablet (100 mg total) by mouth daily.     Psychiatry:  Antidepressants -  SSRI - sertraline Failed - 06/24/2022 10:44 AM      Failed - AST in normal range and within 360 days    AST  Date Value Ref Range Status  11/11/2020 22 15 - 41 U/L Final   SGOT(AST)  Date Value Ref Range Status  01/12/2013 29 15 - 37 Unit/L Final         Failed - ALT in normal range and within 360 days    ALT  Date Value Ref Range Status  11/11/2020 15 0 - 44 U/L Final   SGPT (ALT)  Date Value Ref Range Status  01/12/2013 35 12 - 78 U/L Final         Failed - Valid encounter within last 6 months    Recent Outpatient Visits           1 year ago Hypertensive urgency   [facility] Gwyneth Sprout, FNP   1 year ago Need for influenza vaccination   [facility] Gwyneth Sprout, FNP   1 year ago Essential (primary) hypertension   [facility] [place], Dionne Bucy, MD   1 year ago Hypertensive urgency   [place], Vickki Muff, PA-C   1 year ago Chronic idiopathic constipation   [place], [place]       Future  Appointments             In 5 days Gwyneth Sprout, [place], [place] - Completed PHQ-2 or PHQ-9 in the last 360 days

## 2022-06-24 NOTE — Telephone Encounter (Signed)
Patients son [name] is wanting to know if he can get a short supply of medication for his mother until her appointment 06/29/22. Patient was originally scheduled for 06/25/22 but he received a call to reschedule due to PCP going to be be [place]. Per [name] patient is out of medication.    ALPRAZolam (XANAX) 0.25 MG tablet  amLODipine (NORVASC) 10 MG tablet  sertraline (ZOLOFT) 100 MG tablet     Patient referred to PCP from Pharmacy.  [place], [place] Phone: [phone]  Fax: [phone]     Patient has been seen within the last year with PCP.  Please advise.

## 2022-06-25 ENCOUNTER — Encounter: Payer: PPO | Admitting: Family Medicine

## 2022-06-29 ENCOUNTER — Ambulatory Visit (INDEPENDENT_AMBULATORY_CARE_PROVIDER_SITE_OTHER): Payer: PPO | Admitting: Family Medicine

## 2022-06-29 ENCOUNTER — Encounter: Payer: Self-pay | Admitting: Family Medicine

## 2022-06-29 VITALS — BP 171/86 | HR 75 | Temp 97.7°F | Wt 151.2 lb

## 2022-06-29 DIAGNOSIS — Z0000 Encounter for general adult medical examination without abnormal findings: Secondary | ICD-10-CM

## 2022-06-29 DIAGNOSIS — F5104 Psychophysiologic insomnia: Secondary | ICD-10-CM

## 2022-06-29 DIAGNOSIS — I10 Essential (primary) hypertension: Secondary | ICD-10-CM

## 2022-06-29 DIAGNOSIS — G301 Alzheimer's disease with late onset: Secondary | ICD-10-CM | POA: Insufficient documentation

## 2022-06-29 DIAGNOSIS — F02A0 Dementia in other diseases classified elsewhere, mild, without behavioral disturbance, psychotic disturbance, mood disturbance, and anxiety: Secondary | ICD-10-CM

## 2022-06-29 DIAGNOSIS — I160 Hypertensive urgency: Secondary | ICD-10-CM

## 2022-06-29 DIAGNOSIS — F419 Anxiety disorder, unspecified: Secondary | ICD-10-CM

## 2022-06-29 MED ORDER — LOSARTAN POTASSIUM 100 MG PO TABS: 100.0000 mg | ORAL_TABLET | Freq: Every day | ORAL | 3 refills | Status: DC

## 2022-06-29 MED ORDER — TRAZODONE HCL 150 MG PO TABS: 150.0000 mg | ORAL_TABLET | Freq: Every day | ORAL | 3 refills | Status: DC

## 2022-06-29 MED ORDER — DONEPEZIL HCL 10 MG PO TABS: 10.0000 mg | ORAL_TABLET | Freq: Every day | ORAL | 3 refills | Status: DC

## 2022-06-29 MED ORDER — AMLODIPINE BESYLATE 10 MG PO TABS: 10.0000 mg | ORAL_TABLET | Freq: Every day | ORAL | 3 refills | Status: DC

## 2022-06-29 MED ORDER — SERTRALINE HCL 100 MG PO TABS: 100.0000 mg | ORAL_TABLET | Freq: Every day | ORAL | 3 refills | Status: DC

## 2022-06-29 MED ORDER — ALPRAZOLAM 0.25 MG PO TABS: 0.2500 mg | ORAL_TABLET | Freq: Three times a day (TID) | ORAL | 1 refills | Status: DC | PRN

## 2022-06-29 NOTE — Assessment & Plan Note (Signed)
Chronic, elevated Patient has been without medication for 3 months Wishes to restart Norvasc 10 mg, Losartan 100 mg, Maxzide 37.5-25

## 2022-06-29 NOTE — Assessment & Plan Note (Signed)
Chronic, stable Patient's son reports "lack of motivation" not lack of ability Continue Aricept 10 mg qHS Previously followed by neurology

## 2022-06-29 NOTE — Assessment & Plan Note (Signed)
Chronic, exacerbated Refill of both Aricept 10 mg and Trazodone 100 mg

## 2022-06-29 NOTE — Progress Notes (Signed)
I,Connie R Striblin,acting as a Education administrator for Jordan Sprout, FNP.,have documented all relevant documentation on the behalf of Jordan Sprout, FNP,as directed by  Jordan Sprout, FNP while in the presence of Jordan Sprout, FNP.  Annual Wellness Visit  Patient: Jordan Gordon, Female    DOB: 1934-04-15, 89 y.o.   MRN: [id number] Visit Date: 06/29/2022  Today's Provider: Gwyneth Sprout, FNP  Re Introduced to nurse practitioner role and practice setting.  All questions answered.  Discussed provider/patient relationship and expectations.  Chief Complaint  Patient presents with   Annual Exam   Subjective    Jordan Gordon is a 89 y.o. female who presents today for her Annual Wellness Visit. She reports consuming a general diet. The patient does not participate in regular exercise at present. She generally feels well. She reports sleeping poorly. She does not have additional problems to discuss today.   HPI  Depression  Medications: Outpatient Medications Prior to Visit  Medication Sig   Netarsudil-Latanoprost (ROCKLATAN) 0.02-0.005 % SOLN    triamterene-hydrochlorothiazide (MAXZIDE-25) 37.5-25 MG tablet Take 1 tablet by mouth daily.   [DISCONTINUED] ALPRAZolam (XANAX) 0.25 MG tablet Take 1 tablet (0.25 mg total) by mouth daily as needed for anxiety. Courtesy refill; due for in office/video follow up   [DISCONTINUED] amLODipine (NORVASC) 10 MG tablet Take 1 tablet (10 mg total) by mouth daily. OFFICE VISIT NEEDED FOR ADDITIONAL REFILLS   [DISCONTINUED] losartan (COZAAR) 100 MG tablet TAKE 1 TABLET BY MOUTH DAILY   [DISCONTINUED] sertraline (ZOLOFT) 100 MG tablet TAKE 1 TABLET BY MOUTH DAILY   [DISCONTINUED] traZODone (DESYREL) 150 MG tablet TAKE ONE TABLET BY MOUTH AT BEDTIME   dorzolamide-timolol (COSOPT) 22.3-6.8 MG/ML ophthalmic solution 1 drop 2 (two) times daily. (Patient not taking: Reported on 06/29/2022)   [DISCONTINUED] amLODipine (NORVASC) 2.5 MG tablet  (Patient not taking: Reported on  06/29/2022)   [DISCONTINUED] donepezil (ARICEPT) 10 MG tablet Take 1 tablet (10 mg total) by mouth at bedtime.   [DISCONTINUED] traZODone (DESYREL) 100 MG tablet Take 1 tablet by mouth at bedtime. (Patient not taking: Reported on 06/29/2022)   No facility-administered medications prior to visit.    Allergies  Allergen Reactions   Codeine     nausea   Patient Care Team: Jordan Sprout, FNP as PCP - General (Family Medicine) Birder Robson, MD as Referring Physician (Ophthalmology)  Review of Systems  HENT:  Positive for dental problem.   Gastrointestinal:  Positive for constipation.  Musculoskeletal:  Positive for back pain.  Hematological:  Bruises/bleeds easily.  Psychiatric/Behavioral:  Positive for agitation and confusion.   All other systems reviewed and are negative.      Objective    Vitals: BP (!) 171/86 (BP Location: Left Arm, Patient Position: Sitting, Cuff Size: Normal)   Pulse 75   Temp 97.7 F (36.5 C) (Oral)   Wt 151 lb 3.2 oz (68.6 kg)   SpO2 100%   BMI 25.16 kg/m    Physical Exam Vitals and nursing note reviewed.  Constitutional:      General: She is not in acute distress.    Appearance: Normal appearance. She is overweight. She is not ill-appearing, toxic-appearing or diaphoretic.  HENT:     Head: Normocephalic and atraumatic.  Cardiovascular:     Rate and Rhythm: Normal rate and regular rhythm.     Pulses: Normal pulses.     Heart sounds: Normal heart sounds. No murmur heard.    No friction rub. No gallop.  Pulmonary:     Effort: Pulmonary effort is normal. No respiratory distress.     Breath sounds: Normal breath sounds. No stridor. No wheezing, rhonchi or rales.  Chest:     Chest wall: No tenderness.  Abdominal:     General: Bowel sounds are normal.     Palpations: Abdomen is soft.  Musculoskeletal:        General: No swelling, tenderness, deformity or signs of injury. Normal range of motion.     Right lower leg: No edema.     Left lower  leg: No edema.  Skin:    General: Skin is warm and dry.     Capillary Refill: Capillary refill takes less than 2 seconds.     Coloration: Skin is not jaundiced or pale.     Findings: No bruising, erythema, lesion or rash.  Neurological:     General: No focal deficit present.     Mental Status: She is alert and oriented to person, place, and time. Mental status is at baseline.     Cranial Nerves: No cranial nerve deficit.     Sensory: No sensory deficit.     Motor: No weakness.     Coordination: Coordination normal.  Psychiatric:        Mood and Affect: Mood normal.        Behavior: Behavior normal.        Thought Content: Thought content normal.        Judgment: Judgment normal.    Most recent functional status assessment:    08/27/2021    1:42 PM  In your present state of health, do you have any difficulty performing the following activities:  Hearing? 0  Vision? 0  Difficulty concentrating or making decisions? 0  Walking or climbing stairs? 0  Dressing or bathing? 0  Doing errands, shopping? 0  Preparing Food and eating ? N  Using the Toilet? N  In the past six months, have you accidently leaked urine? N  Do you have problems with loss of bowel control? N  Managing your Medications? N  Managing your Finances? N  Housekeeping or managing your Housekeeping? N   Most recent fall risk assessment:    06/29/2022    2:31 PM  Fall Risk   Falls in the past year? 0  Number falls in past yr: 0  Injury with Fall? 0    Most recent depression screenings:    06/29/2022    2:31 PM 08/27/2021    1:40 PM  PHQ 2/9 Scores  PHQ - 2 Score 2 0  PHQ- 9 Score 6    Most recent cognitive screening:    11/14/2020    3:08 PM  6CIT Screen  What Year? 0 points  What month? 0 points  What time? 0 points  Count back from 20 0 points  Months in reverse 4 points  Repeat phrase 6 points  Total Score 10 points   Most recent Audit-C alcohol use screening    06/29/2022    2:32 PM   Alcohol Use Disorder Test (AUDIT)  1. How often do you have a drink containing alcohol? 0  2. How many drinks containing alcohol do you have on a typical day when you are drinking? 0  3. How often do you have six or more drinks on one occasion? 0  AUDIT-C Score 0   A score of 3 or more in women, and 4 or more in men indicates increased risk for alcohol abuse, EXCEPT  if all of the points are from question 1   No results found for any visits on 06/29/22.  Assessment & Plan     Annual wellness visit done today including the all of the following: Reviewed patient's Family Medical History Reviewed and updated list of patient's medical providers Assessment of cognitive impairment was done Assessed patient's functional ability Established a written schedule for health screening [place] Completed and Reviewed  Exercise Activities and Dietary recommendations  Goals      DIET - EAT MORE FRUITS AND VEGETABLES     Prevent falls     Recommend to remove any items from the home that [place] cause slips or trips.        Immunization History  Administered Date(s) Administered   Fluad Quad(high Dose 65+) 02/21/2019, 04/21/2021, 05/14/2022   Influenza Split 04/08/2005, 02/20/2013   Influenza, High Dose Seasonal PF 02/27/2014, 03/04/2015, 04/13/2017, 05/16/2018   Influenza-Unspecified 03/18/2016, 03/09/2020   PFIZER(Purple Top)SARS-COV-2 Vaccination 04/17/2020   Pneumococcal Conjugate-13 01/24/2014   Pneumococcal Polysaccharide-23 04/18/2015   Td 03/07/1999   Tdap 03/30/2018   Zoster, Live 03/24/2012    Health Maintenance  Topic Date Due   COVID-19 Vaccine (2 - 2023-24 season) 07/15/2022 (Originally 02/06/2022)   Zoster Vaccines- Shingrix (1 of 2) 09/28/2022 (Originally 10/21/1985)   DEXA SCAN  06/08/2026 (Originally 10/21/2000)   Medicare Annual Wellness (AWV)  06/30/2023   COLONOSCOPY (Pts 45-43yr Insurance coverage will need to be confirmed)  09/19/2023    DTaP/Tdap/Td (3 - Td or Tdap) 03/30/2028   Pneumonia Vaccine 89 Years old  Completed   INFLUENZA VACCINE  Completed   HPV VACCINES  Aged Out     Discussed health benefits of physical activity, and encouraged her to engage in regular exercise appropriate for her age and condition.    Problem List Items Addressed This Visit       Cardiovascular and Mediastinum   Essential (primary) hypertension    Chronic, elevated Patient has been without medication for 3 months Wishes to restart Norvasc 10 mg, Losartan 100 mg, Maxzide 37.5-25      Relevant Medications   amLODipine (NORVASC) 10 MG tablet   losartan (COZAAR) 100 MG tablet   Other Relevant Orders   Comprehensive Metabolic Panel (CMET)   CBC   RESOLVED: Hypertensive urgency   Relevant Medications   amLODipine (NORVASC) 10 MG tablet   losartan (COZAAR) 100 MG tablet     Nervous and Auditory   Mild late onset Alzheimer's dementia without behavioral disturbance, psychotic disturbance, mood disturbance, or anxiety (HCC)    Chronic, stable Patient's son reports "lack of motivation" not lack of ability Continue Aricept 10 mg qHS Previously followed by neurology       Relevant Medications   sertraline (ZOLOFT) 100 MG tablet   ALPRAZolam (XANAX) 0.25 MG tablet   donepezil (ARICEPT) 10 MG tablet   traZODone (DESYREL) 150 MG tablet     Other   Annual physical exam    UTD on dental Hx of macular degeneration Denies falls       Anxiety   Relevant Medications   sertraline (ZOLOFT) 100 MG tablet   ALPRAZolam (XANAX) 0.25 MG tablet   traZODone (DESYREL) 150 MG tablet   Insomnia    Chronic, exacerbated Refill of both Aricept 10 mg and Trazodone 100 mg       Relevant Medications   traZODone (DESYREL) 150 MG tablet   Medicare annual wellness visit, subsequent - Primary    Things to do  to keep yourself healthy  - Exercise at least 30-45 minutes a day, 3-4 days a week.  - Eat a low-fat diet with lots of fruits and  vegetables, up to 7-9 servings per day.  - Seatbelts can save your life. Wear them always.  - Smoke detectors on every level of your home, check batteries every year.  - Eye Doctor - have an eye exam every 1-2 years  - Safe sex - if you [place] be exposed to STDs, use a condom.  - Alcohol -  If you drink, do it moderately, less than 2 drinks per day.  - [place]. Choose someone to speak for you if you are not able.  - Depression is common in our stressful world.If you're feeling down or losing interest in things you normally enjoy, please come in for a visit.  - Violence - If anyone is threatening or hurting you, please call immediately.  Son helps with meds, breakfast- checks in patient x3 per day      Return in about 4 weeks (around 07/27/2022) for HTN management.    Vonna Kotyk, FNP, have reviewed all documentation for this visit. The documentation on 06/29/22 for the exam, diagnosis, procedures, and orders are all accurate and complete.  Jordan Gordon, [place] [phone] (phone) [phone] (fax)  [place]

## 2022-06-29 NOTE — Assessment & Plan Note (Signed)
UTD on dental Hx of macular degeneration Denies falls

## 2022-06-29 NOTE — Assessment & Plan Note (Signed)
Things to do to keep yourself healthy  - Exercise at least 30-45 minutes a day, 3-4 days a week.  - Eat a low-fat diet with lots of fruits and vegetables, up to 7-9 servings per day.  - Seatbelts can save your life. Wear them always.  - Smoke detectors on every level of your home, check batteries every year.  - Eye Doctor - have an eye exam every 1-2 years  - Safe sex - if you [place] be exposed to STDs, use a condom.  - Alcohol -  If you drink, do it moderately, less than 2 drinks per day.  - [place]. Choose someone to speak for you if you are not able.  - Depression is common in our stressful world.If you're feeling down or losing interest in things you normally enjoy, please come in for a visit.  - Violence - If anyone is threatening or hurting you, please call immediately.  Son helps with meds, breakfast- checks in patient x3 per day

## 2022-06-30 LAB — COMPREHENSIVE METABOLIC PANEL
ALT: 11 IU/L (ref 0–32)
AST: 18 IU/L (ref 0–40)
Albumin/Globulin Ratio: 1.9 (ref 1.2–2.2)
Albumin: 4.3 g/dL (ref 3.7–4.7)
Alkaline Phosphatase: 93 IU/L (ref 44–121)
BUN/Creatinine Ratio: 11 — ABNORMAL LOW (ref 12–28)
BUN: 14 mg/dL (ref 8–27)
Bilirubin Total: 0.4 mg/dL (ref 0.0–1.2)
CO2: 24 mmol/L (ref 20–29)
Calcium: 9.2 mg/dL (ref 8.7–10.3)
Chloride: 104 mmol/L (ref 96–106)
Creatinine, Ser: 1.27 mg/dL — ABNORMAL HIGH (ref 0.57–1.00)
Globulin, Total: 2.3 g/dL (ref 1.5–4.5)
Glucose: 96 mg/dL (ref 70–99)
Potassium: 3.7 mmol/L (ref 3.5–5.2)
Sodium: 144 mmol/L (ref 134–144)
Total Protein: 6.6 g/dL (ref 6.0–8.5)
eGFR: 41 mL/min/{1.73_m2} — ABNORMAL LOW (ref >59)

## 2022-06-30 LAB — CBC
Hematocrit: 41.5 % (ref 34.0–46.6)
Hemoglobin: 14.5 g/dL (ref 11.1–15.9)
MCH: 30.2 pg (ref 26.6–33.0)
MCHC: 34.9 g/dL (ref 31.5–35.7)
MCV: 87 fL (ref 79–97)
Platelets: 200 10*3/uL (ref 150–450)
RBC: 4.8 x10E6/uL (ref 3.77–5.28)
RDW: 13.2 % (ref 11.7–15.4)
WBC: 5.3 10*3/uL (ref 3.4–10.8)

## 2022-06-30 NOTE — Progress Notes (Signed)
Creatinine has increased and eGFR has decreased showing worsening kidney function. Could be transient given untreated blood pressure. Recommend repeat in 1 month with BP follow up. If remains elevated, recommend consult with kidney specialist. Focus on 48 oz of water intake per day.

## 2022-08-05 ENCOUNTER — Ambulatory Visit: Payer: PPO | Admitting: Neurology

## 2022-09-16 IMAGING — CR DG CHEST 2V
2 series · 2 of 2 positions shown · non-contrast
Comparison: January 13, 2019

CLINICAL DATA: Chest pain

EXAM:
CHEST - 2 VIEW

[chest pa]
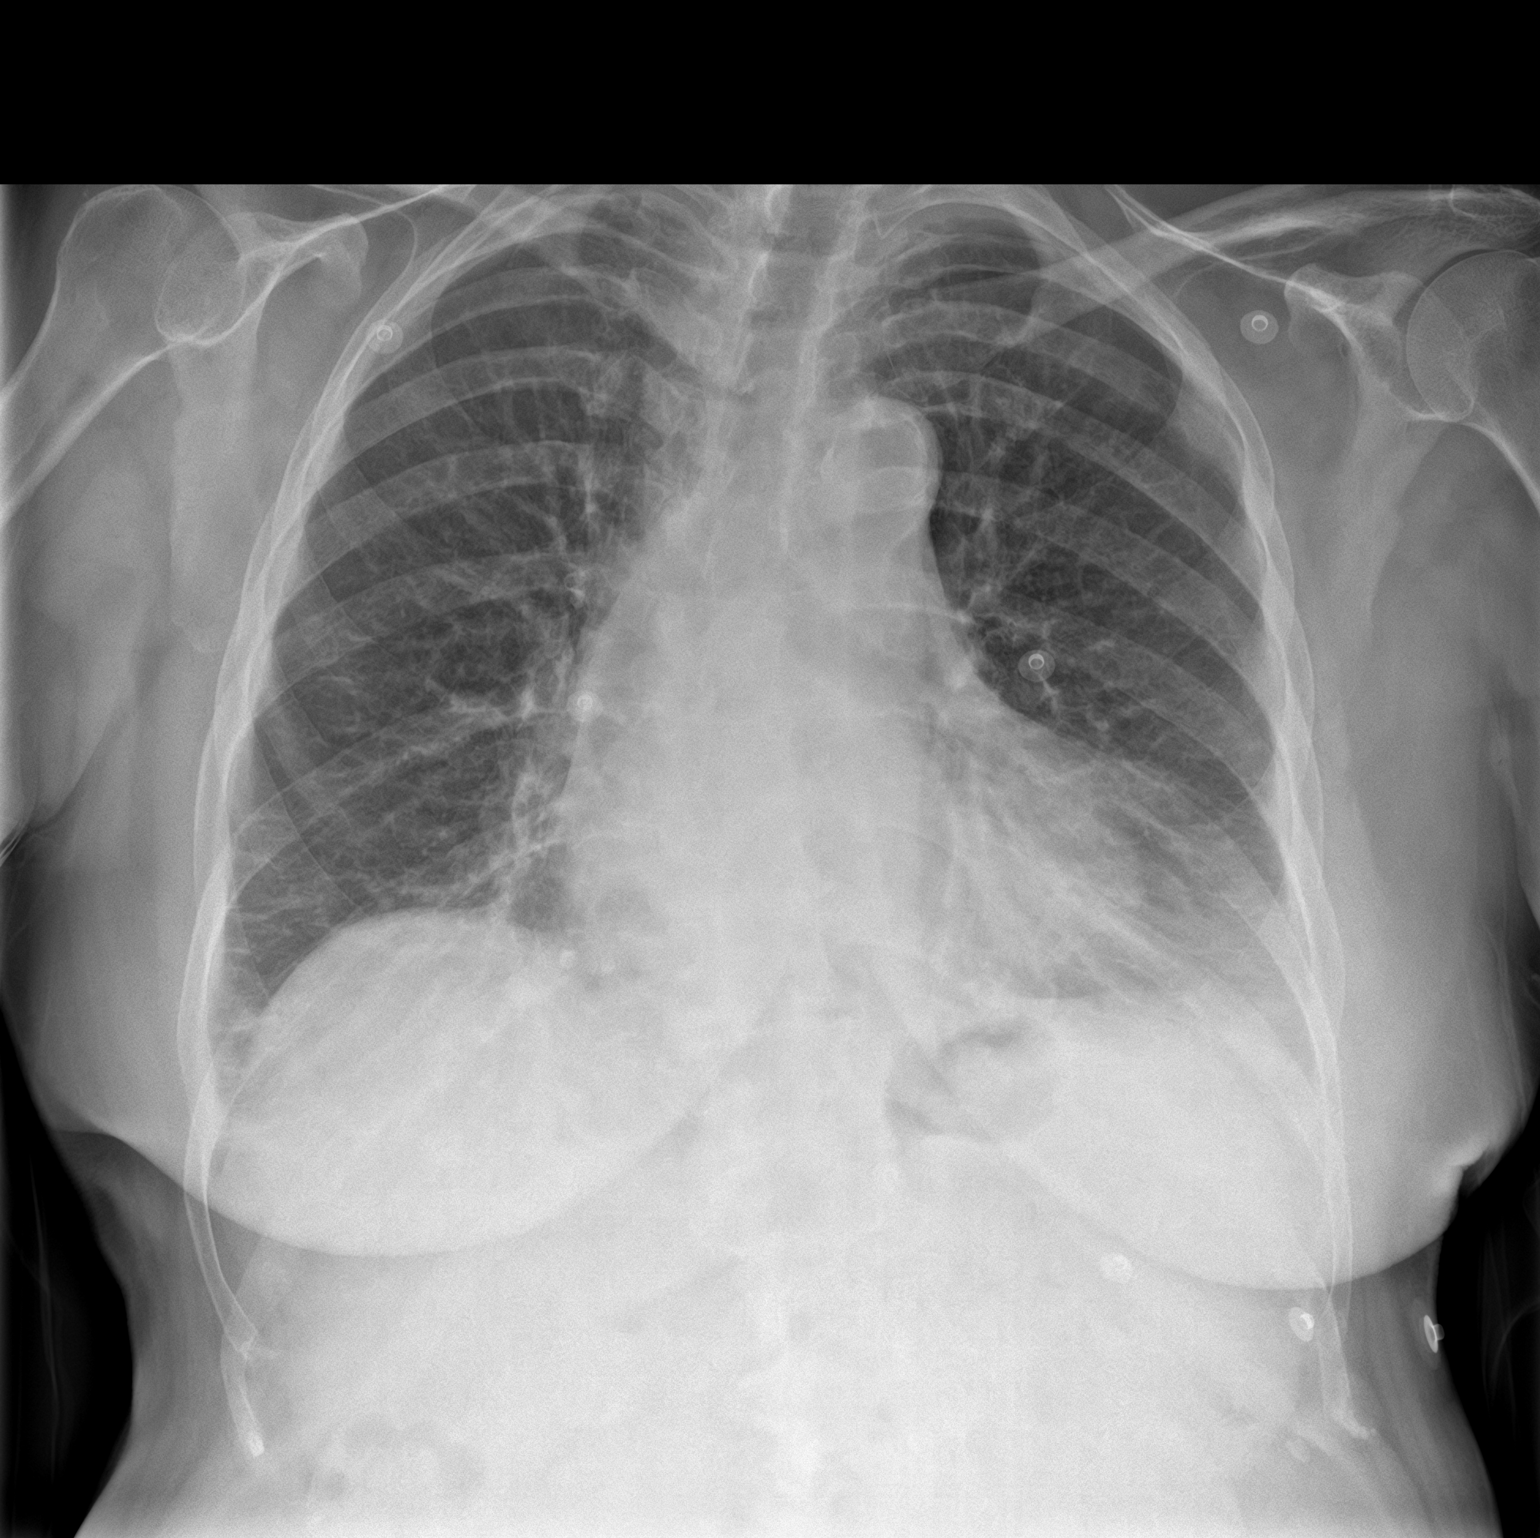

[chest lat]
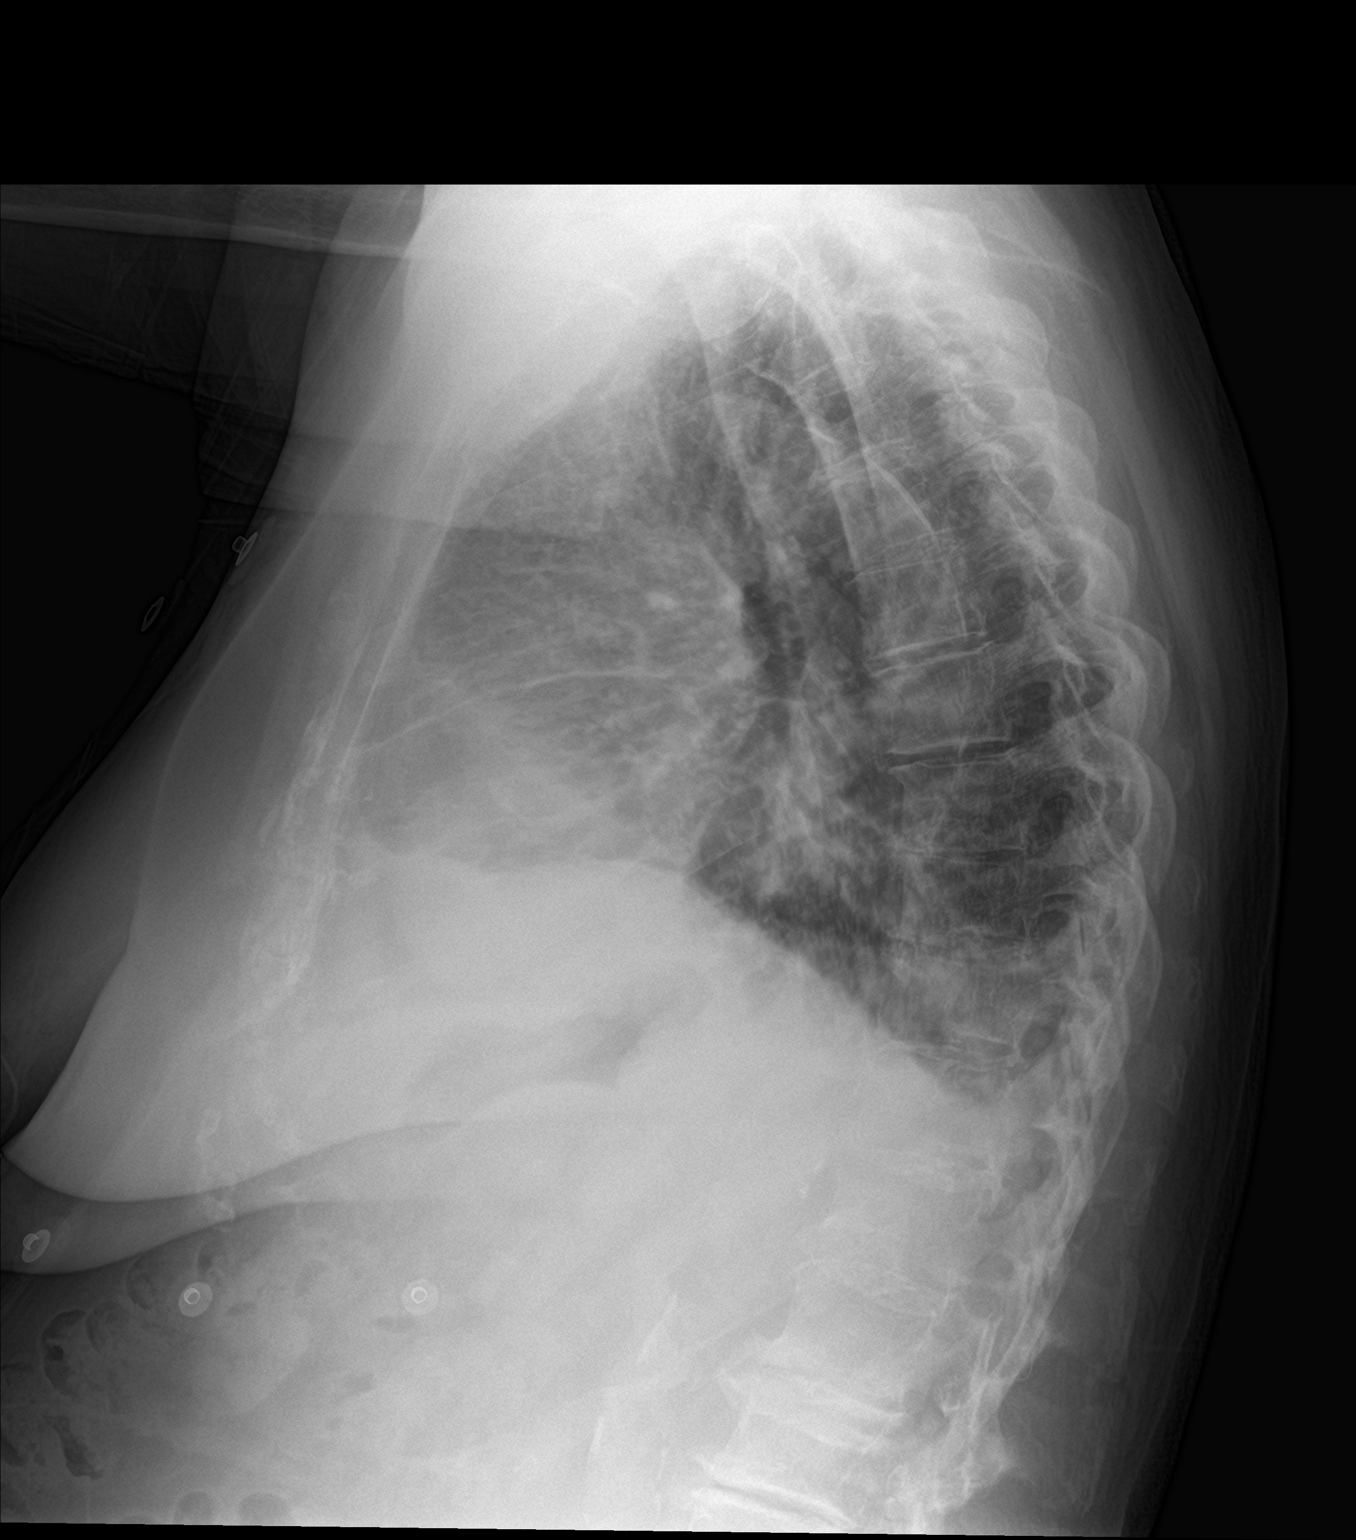

[2 of 2 positions shown; findings below may reference images not displayed]

FINDINGS: There is mild scarring in the lung bases. No edema or airspace
opacity. Heart is borderline enlarged with pulmonary vascularity
normal. No adenopathy. There is aortic atherosclerosis. There is
degenerative change in the thoracic spine.
IMPRESSION: Bibasilar scarring. No edema or airspace opacity. Heart borderline
enlarged. Aortic Atherosclerosis ([id number]).

## 2022-10-21 ENCOUNTER — Ambulatory Visit (INDEPENDENT_AMBULATORY_CARE_PROVIDER_SITE_OTHER): Payer: PPO

## 2022-10-21 VITALS — Ht 63.0 in | Wt 151.0 lb

## 2022-10-21 DIAGNOSIS — Z0000 Encounter for general adult medical examination without abnormal findings: Secondary | ICD-10-CM | POA: Diagnosis not present

## 2022-10-21 NOTE — Patient Instructions (Addendum)
Ms. Fuson , Thank you for taking time to come for your Medicare Wellness Visit. I appreciate your ongoing commitment to your health goals. Please review the following plan we discussed and let me know if I can assist you in the future.   These are the goals we discussed:  Goals      DIET - EAT MORE FRUITS AND VEGETABLES     Prevent falls     Recommend to remove any items from the home that Sossamon cause slips or trips.        This is a list of the screening recommended for you and due dates:  Health Maintenance  Topic Date Due   Zoster (Shingles) Vaccine (1 of 2) Never done   COVID-19 Vaccine (2 - 2023-24 season) 02/06/2022   DEXA scan (bone density measurement)  06/08/2026*   Flu Shot  01/07/2023   Colon Cancer Screening  09/19/2023   Medicare Annual Wellness Visit  10/21/2023   DTaP/Tdap/Td vaccine (3 - Td or Tdap) 03/30/2028   Pneumonia Vaccine  Completed   HPV Vaccine  Aged Out  *Topic was postponed. The date shown is not the original due date.    Advanced directives: yes  Conditions/risks identified: low falls risk  Next appointment: Follow up in one year for your annual wellness visit 10/26/2023 @ 11:15am telephone   Preventive Care 65 Years and Older, Female Preventive care refers to lifestyle choices and visits with your health care provider that can promote health and wellness. What does preventive care include? A yearly physical exam. This is also called an annual well check. Dental exams once or twice a year. Routine eye exams. Ask your health care provider how often you should have your eyes checked. Personal lifestyle choices, including: Daily care of your teeth and gums. Regular physical activity. Eating a healthy diet. Avoiding tobacco and drug use. Limiting alcohol use. Practicing safe sex. Taking low-dose aspirin every day. Taking vitamin and mineral supplements as recommended by your health care provider. What happens during an annual well check? The  services and screenings done by your health care provider during your annual well check will depend on your age, overall health, lifestyle risk factors, and family history of disease. Counseling  Your health care provider Girvan ask you questions about your: Alcohol use. Tobacco use. Drug use. Emotional well-being. Home and relationship well-being. Sexual activity. Eating habits. History of falls. Memory and ability to understand (cognition). Work and work Astronomer. Reproductive health. Screening  You Duma have the following tests or measurements: Height, weight, and BMI. Blood pressure. Lipid and cholesterol levels. These Kirtley be checked every 5 years, or more frequently if you are over 76 years old. Skin check. Lung cancer screening. You Polzin have this screening every year starting at age 75 if you have a 30-pack-year history of smoking and currently smoke or have quit within the past 15 years. Fecal occult blood test (FOBT) of the stool. You Litle have this test every year starting at age 29. Flexible sigmoidoscopy or colonoscopy. You Dockstader have a sigmoidoscopy every 5 years or a colonoscopy every 10 years starting at age 48. Hepatitis C blood test. Hepatitis B blood test. Sexually transmitted disease (STD) testing. Diabetes screening. This is done by checking your blood sugar (glucose) after you have not eaten for a while (fasting). You Butikofer have this done every 1-3 years. Bone density scan. This is done to screen for osteoporosis. You Selvidge have this done starting at age 55. Mammogram. This  Mudry be done every 1-2 years. Talk to your health care provider about how often you should have regular mammograms. Talk with your health care provider about your test results, treatment options, and if necessary, the need for more tests. Vaccines  Your health care provider Cromie recommend certain vaccines, such as: Influenza vaccine. This is recommended every year. Tetanus, diphtheria, and acellular  pertussis (Tdap, Td) vaccine. You Dutta need a Td booster every 10 years. Zoster vaccine. You Valletta need this after age 10. Pneumococcal 13-valent conjugate (PCV13) vaccine. One dose is recommended after age 25. Pneumococcal polysaccharide (PPSV23) vaccine. One dose is recommended after age 6. Talk to your health care provider about which screenings and vaccines you need and how often you need them. This information is not intended to replace advice given to you by your health care provider. Make sure you discuss any questions you have with your health care provider. Document Released: 06/21/2015 Document Revised: 02/12/2016 Document Reviewed: 03/26/2015 Elsevier Interactive Patient Education  2017 [place] Prevention in the Home Falls can cause injuries. They can happen to people of all ages. There are many things you can do to make your home safe and to help prevent falls. What can I do on the outside of my home? Regularly fix the edges of walkways and driveways and fix any cracks. Remove anything that might make you trip as you walk through a door, such as a raised step or threshold. Trim any bushes or trees on the path to your home. Use bright outdoor lighting. Clear any walking paths of anything that might make someone trip, such as rocks or tools. Regularly check to see if handrails are loose or broken. Make sure that both sides of any steps have handrails. Any raised decks and porches should have guardrails on the edges. Have any leaves, snow, or ice cleared regularly. Use sand or salt on walking paths during winter. Clean up any spills in your garage right away. This includes oil or grease spills. What can I do in the bathroom? Use night lights. Install grab bars by the toilet and in the tub and shower. Do not use towel bars as grab bars. Use non-skid mats or decals in the tub or shower. If you need to sit down in the shower, use a plastic, non-slip stool. Keep the floor  dry. Clean up any water that spills on the floor as soon as it happens. Remove soap buildup in the tub or shower regularly. Attach bath mats securely with double-sided non-slip rug tape. Do not have throw rugs and other things on the floor that can make you trip. What can I do in the bedroom? Use night lights. Make sure that you have a light by your bed that is easy to reach. Do not use any sheets or blankets that are too big for your bed. They should not hang down onto the floor. Have a firm chair that has side arms. You can use this for support while you get dressed. Do not have throw rugs and other things on the floor that can make you trip. What can I do in the kitchen? Clean up any spills right away. Avoid walking on wet floors. Keep items that you use a lot in easy-to-reach places. If you need to reach something above you, use a strong step stool that has a grab bar. Keep electrical cords out of the way. Do not use floor polish or wax that makes floors slippery. If you must  use wax, use non-skid floor wax. Do not have throw rugs and other things on the floor that can make you trip. What can I do with my stairs? Do not leave any items on the stairs. Make sure that there are handrails on both sides of the stairs and use them. Fix handrails that are broken or loose. Make sure that handrails are as long as the stairways. Check any carpeting to make sure that it is firmly attached to the stairs. Fix any carpet that is loose or worn. Avoid having throw rugs at the top or bottom of the stairs. If you do have throw rugs, attach them to the floor with carpet tape. Make sure that you have a light switch at the top of the stairs and the bottom of the stairs. If you do not have them, ask someone to add them for you. What else can I do to help prevent falls? Wear shoes that: Do not have high heels. Have rubber bottoms. Are comfortable and fit you well. Are closed at the toe. Do not wear  sandals. If you use a stepladder: Make sure that it is fully opened. Do not climb a closed stepladder. Make sure that both sides of the stepladder are locked into place. Ask someone to hold it for you, if possible. Clearly mark and make sure that you can see: Any grab bars or handrails. First and last steps. Where the edge of each step is. Use tools that help you move around (mobility aids) if they are needed. These include: Canes. Walkers. Scooters. Crutches. Turn on the lights when you go into a dark area. Replace any light bulbs as soon as they burn out. Set up your furniture so you have a clear path. Avoid moving your furniture around. If any of your floors are uneven, fix them. If there are any pets around you, be aware of where they are. Review your medicines with your doctor. Some medicines can make you feel dizzy. This can increase your chance of falling. Ask your doctor what other things that you can do to help prevent falls. This information is not intended to replace advice given to you by your health care provider. Make sure you discuss any questions you have with your health care provider. Document Released: 03/21/2009 Document Revised: 10/31/2015 Document Reviewed: 06/29/2014 Elsevier Interactive Patient Education  2017 [employer].

## 2022-10-21 NOTE — Progress Notes (Signed)
I connected with  Jordan Gordon on 10/21/22 by a audio enabled telemedicine application and verified that I am speaking with the correct person using two identifiers.  Patient Location: Home  Provider Location: Office/Clinic  I discussed the limitations of evaluation and management by telemedicine. The patient expressed understanding and agreed to proceed.  Subjective:   Jordan Gordon is a 89 y.o. female who presents for Medicare Annual (Subsequent) preventive examination.  Review of Systems    Cardiac Risk Factors include: advanced age (>56men, >67 women);hypertension     Objective:    Today's Vitals   10/21/22 1120  Weight: 151 lb (68.5 kg)  Height: 5\' 3"  (1.6 m)   Body mass index is 26.75 kg/m.     10/21/2022   11:32 AM 08/27/2021    1:41 PM 11/11/2020    7:48 AM 10/28/2020    8:35 AM 10/24/2020    8:10 AM 09/18/2020    6:48 AM 03/06/2020    1:28 PM  Advanced Directives  Does Patient Have a Medical Advance Directive? Yes No No No No No Yes  Type of Estate agent of [place];Living will      Healthcare Power of [place];Living will  Does patient want to make changes to medical advance directive?    No - Patient declined No - Patient declined    Copy of Healthcare Power of Attorney in Chart?       No - copy requested  Would patient like information on creating a medical advance directive?  No - Patient declined         Current Medications (verified) Outpatient Encounter Medications as of 10/21/2022  Medication Sig   ALPRAZolam (XANAX) 0.25 MG tablet Take 1 tablet (0.25 mg total) by mouth 3 (three) times daily as needed for anxiety.   amLODipine (NORVASC) 10 MG tablet Take 1 tablet (10 mg total) by mouth daily.   donepezil (ARICEPT) 10 MG tablet Take 1 tablet (10 mg total) by mouth at bedtime.   dorzolamide-timolol (COSOPT) 22.3-6.8 MG/ML ophthalmic solution 1 drop 2 (two) times daily. (Patient not taking: Reported on 06/29/2022)   losartan (COZAAR) 100 MG  tablet Take 1 tablet (100 mg total) by mouth daily.   Netarsudil-Latanoprost (ROCKLATAN) 0.02-0.005 % SOLN    sertraline (ZOLOFT) 100 MG tablet Take 1 tablet (100 mg total) by mouth daily.   traZODone (DESYREL) 150 MG tablet Take 1 tablet (150 mg total) by mouth at bedtime.   triamterene-hydrochlorothiazide (MAXZIDE-25) 37.5-25 MG tablet Take 1 tablet by mouth daily.   No facility-administered encounter medications on file as of 10/21/2022.    Allergies (verified) Codeine   History: Past Medical History:  Diagnosis Date   Anxiety    Coronary artery disease    GERD (gastroesophageal reflux disease)    HOH (hard of hearing)    Hyperlipidemia    Hypertension    Major depressive disorder    Past Surgical History:  Procedure Laterality Date   ABDOMINAL HYSTERECTOMY  1979   BREAST EXCISIONAL BIOPSY Left 2005   benign   CATARACT EXTRACTION W/PHACO Left 02/08/2018   Procedure: CATARACT EXTRACTION PHACO AND INTRAOCULAR LENS PLACEMENT (IOC);  Surgeon: Galen Manila, MD;  Location: [facility] ORS;  Service: Ophthalmology;  Laterality: Left;  [place] 00:33.4 AP% 13.9 CDE 4.66 Fluid Pack lot # [id number]   COLONOSCOPY WITH PROPOFOL N/A 07/22/2015   Procedure: COLONOSCOPY WITH PROPOFOL;  Surgeon: Scot Jun, MD;  Location: [facility] ENDOSCOPY;  Service: Endoscopy;  Laterality: N/A;   COLONOSCOPY  WITH PROPOFOL N/A 10/19/2016   Procedure: COLONOSCOPY WITH PROPOFOL;  Surgeon: Scot Jun, MD;  Location: [facility] ENDOSCOPY;  Service: Endoscopy;  Laterality: N/A;   COLONOSCOPY WITH PROPOFOL N/A 09/18/2020   Procedure: COLONOSCOPY WITH PROPOFOL;  Surgeon: Earline Mayotte, MD;  Location: [facility] ENDOSCOPY;  Service: Endoscopy;  Laterality: N/A;   ESOPHAGOGASTRODUODENOSCOPY (EGD) WITH PROPOFOL N/A 09/18/2020   Procedure: ESOPHAGOGASTRODUODENOSCOPY (EGD) WITH PROPOFOL;  Surgeon: Earline Mayotte, MD;  Location: [facility] ENDOSCOPY;  Service: Endoscopy;  Laterality: N/A;   JOINT REPLACEMENT     TKR   KNEE SURGERY  Left 2011   2007   TONSILLECTOMY  1940   ADENOIDS   Family History  Problem Relation Age of Onset   Cancer Mother    Heart disease Father    Cancer Brother    Breast cancer Neg Hx    Social History   Socioeconomic History   Marital status: Widowed    Spouse name: Not on file   Number of children: 2   Years of education: Not on file   Highest education level: High school graduate  Occupational History   Occupation: retired  Tobacco Use   Smoking status: Never   Smokeless tobacco: Never  Vaping Use   Vaping Use: Never used  Substance and Sexual Activity   Alcohol use: Yes    Alcohol/week: 0.0 standard drinks of alcohol    Comment: wine once every 2 weeks   Drug use: No   Sexual activity: Not on file  Other Topics Concern   Not on file  Social History Narrative   Lives alone   Right handed   Drinks 1 cup caffeine daily   Social Determinants of Health   Financial Resource Strain: Low Risk  (08/27/2021)   Overall Financial Resource Strain (CARDIA)    Difficulty of Paying Living Expenses: Not hard at all  Food Insecurity: No Food Insecurity (10/21/2022)   Hunger Vital Sign    Worried About Running Out of Food in the Last Year: Never true    Ran Out of Food in the Last Year: Never true  Transportation Needs: No Transportation Needs (10/21/2022)   PRAPARE - Administrator, Civil Service (Medical): No    Lack of Transportation (Non-Medical): No  Physical Activity: Insufficiently Active (10/21/2022)   Exercise Vital Sign    Days of Exercise per Week: 3 days    Minutes of Exercise per Session: 30 min  Stress: No Stress Concern Present (10/21/2022)   [employer] of Occupational Health - Occupational Stress Questionnaire    Feeling of Stress : Not at all  Social Connections: Moderately Isolated (10/21/2022)   Social Connection and Isolation Panel [NHANES]    Frequency of Communication with Friends and Family: More than three times a week    Frequency of  Social Gatherings with Friends and Family: Twice a week    Attends Religious Services: More than 4 times per year    Active Member of [employer] or Organizations: No    Attends Banker Meetings: Never    Marital Status: Widowed    Tobacco Counseling Counseling given: Not Answered   Clinical Intake:  Pre-visit preparation completed: Yes  Pain : No/denies pain   BMI - recorded: 26.75 Nutritional Status: BMI 25 -29 Overweight Nutritional Risks: None Diabetes: No  How often do you need to have someone help you when you read instructions, pamphlets, or other written materials from your doctor or pharmacy?: 1 - Never  Diabetic?no  Interpreter Needed?: No  Comments: lives alone Information entered by :: B.Alton Tremblay,LPN   Activities of Daily Living    10/21/2022   11:33 AM  In your present state of health, do you have any difficulty performing the following activities:  Hearing? 1  Vision? 0  Difficulty concentrating or making decisions? 1  Walking or climbing stairs? 0  Dressing or bathing? 0  Doing errands, shopping? 0  Preparing Food and eating ? N  Using the Toilet? N  In the past six months, have you accidently leaked urine? N  Do you have problems with loss of bowel control? N  Managing your Medications? N  Managing your Finances? N  Housekeeping or managing your Housekeeping? N    Patient Care Team: Jacky Kindle, FNP as PCP - General (Family Medicine) Galen Manila, MD as Referring Physician (Ophthalmology)  Indicate any recent Medical Services you Pagnotta have received from other than [facility] providers in the past year (date Wilbon be approximate).     Assessment:   This is a routine wellness examination for Jordan Gordon.  Hearing/Vision screen Hearing Screening - Comments:: Adequate hearing Vision Screening - Comments:: Adequate vision Dr Dellie Burns  Dietary issues and exercise activities discussed: Current Exercise Habits: Home exercise routine, Type  of exercise: walking, Time (Minutes): 30, Frequency (Times/Week): 3, Weekly Exercise (Minutes/Week): 90, Intensity: Mild, Exercise limited by: Other - see comments (age)   Goals Addressed   None    Depression Screen    10/21/2022   11:24 AM 06/29/2022    2:31 PM 08/27/2021    1:40 PM 04/21/2021    9:25 AM 11/26/2020    3:33 PM 11/14/2020    3:05 PM 02/19/2020    2:13 PM  PHQ 2/9 Scores  PHQ - 2 Score 2 2 0 1 5 6  0  PHQ- 9 Score 4 6  3 13 26  0    Fall Risk    10/21/2022   11:23 AM 06/29/2022    2:31 PM 08/27/2021    1:42 PM 03/06/2020    1:28 PM 02/19/2020    2:15 PM  Fall Risk   Falls in the past year? 0 0 0 1 0  Number falls in past yr: 0 0 0 0 0  Injury with Fall? 0 0 0 0 0  Risk for fall due to : No Fall Risks  No Fall Risks    Follow up Falls prevention discussed;Education provided  Falls evaluation completed Falls prevention discussed Falls evaluation completed    FALL RISK PREVENTION PERTAINING TO THE HOME:  Any stairs in or around the home? No  If so, are there any without handrails? No  Home free of loose throw rugs in walkways, pet beds, electrical cords, etc? Yes  Adequate lighting in your home to reduce risk of falls? Yes   ASSISTIVE DEVICES UTILIZED TO PREVENT FALLS:  Life alert? No  Use of a cane, walker or w/c? No  Grab bars in the bathroom? Yes  Shower chair or bench in shower? Yes  Elevated toilet seat or a handicapped toilet? Yes   Cognitive Function:    02/05/2021    1:02 PM 07/12/2017   11:29 AM  MMSE - Mini Mental State Exam  Orientation to time 1 5  Orientation to Place 5 5  Registration 3 3  Attention/ Calculation 4 4  Recall 2 3  Language- name 2 objects 2 2  Language- repeat 1 0  Language- follow 3 step command 3 3  Language- read &  follow direction 1 1  Write a sentence 1 1  Copy design 0 0  Total score 23 27      08/05/2021   12:57 PM  Montreal Cognitive Assessment   Visuospatial/ Executive (0/5) 1  Naming (0/3) 0  Attention: Read  list of digits (0/2) 0  Attention: Read list of letters (0/1) 1  Attention: Serial 7 subtraction starting at 100 (0/3) 0  Language: Repeat phrase (0/2) 1  Language : Fluency (0/1) 1  Abstraction (0/2) 2  Delayed Recall (0/5) 0  Orientation (0/6) 6  Total 12      10/21/2022   11:27 AM 11/14/2020    3:08 PM 04/13/2017    9:23 AM 07/09/2016    9:11 AM  6CIT Screen  What Year? 0 points 0 points 0 points 0 points  What month? 0 points 0 points 0 points 0 points  What time? 0 points 0 points 0 points 0 points  Count back from 20 0 points 0 points 0 points 0 points  Months in reverse 4 points 4 points 0 points 2 points  Repeat phrase 8 points 6 points 6 points 6 points  Total Score 12 points 10 points 6 points 8 points    Immunizations Immunization History  Administered Date(s) Administered   Fluad Quad(high Dose 65+) 02/21/2019, 04/21/2021, 05/14/2022   Influenza Split 04/08/2005, 02/20/2013   Influenza, High Dose Seasonal PF 02/27/2014, 03/04/2015, 04/13/2017, 05/16/2018   Influenza-Unspecified 03/18/2016, 03/09/2020   PFIZER(Purple Top)SARS-COV-2 Vaccination 04/17/2020   Pneumococcal Conjugate-13 01/24/2014   Pneumococcal Polysaccharide-23 04/18/2015   Td 03/07/1999   Tdap 03/30/2018   Zoster, Live 03/24/2012    TDAP status: Up to date  Flu Vaccine status: Declined, Education has been provided regarding the importance of this vaccine but patient still declined. Advised Rimmer receive this vaccine at local pharmacy or Health Dept. Aware to provide a copy of the vaccination record if obtained from local pharmacy or Health Dept. Verbalized acceptance and understanding.  Pneumococcal vaccine status: Up to date  Covid-19 vaccine status: Completed vaccines  Qualifies for Shingles Vaccine? Yes   Zostavax completed No   Shingrix Completed?: No.    Education has been provided regarding the importance of this vaccine. Patient has been advised to call insurance company to determine out of  pocket expense if they have not yet received this vaccine. Advised Casa also receive vaccine at local pharmacy or Health Dept. Verbalized acceptance and understanding.  Screening Tests Health Maintenance  Topic Date Due   Zoster Vaccines- Shingrix (1 of 2) Never done   COVID-19 Vaccine (2 - 2023-24 season) 02/06/2022   DEXA SCAN  06/08/2026 (Originally 10/21/2000)   INFLUENZA VACCINE  01/07/2023   COLONOSCOPY (Pts 45-55yrs Insurance coverage will need to be confirmed)  09/19/2023   Medicare Annual Wellness (AWV)  10/21/2023   DTaP/Tdap/Td (3 - Td or Tdap) 03/30/2028   Pneumonia Vaccine 51+ Years old  Completed   HPV VACCINES  Aged Out    Health Maintenance  Health Maintenance Due  Topic Date Due   Zoster Vaccines- Shingrix (1 of 2) Never done   COVID-19 Vaccine (2 - 2023-24 season) 02/06/2022    Colorectal cancer screening: No longer required.   Mammogram status: No longer required due to age.  Lung Cancer Screening: (Low Dose CT Chest recommended if Age 39-80 years, 30 pack-year currently smoking OR have quit w/in 15years.) does not qualify.   Lung Cancer Screening Referral: no  Additional Screening:  Hepatitis C Screening: does not  qualify; Completed yes  Vision Screening: Recommended annual ophthalmology exams for early detection of glaucoma and other disorders of the eye. Is the patient up to date with their annual eye exam?  Yes  Who is the provider or what is the name of the office in which the patient attends annual eye exams? Dr Dellie Burns If pt is not established with a provider, would they like to be referred to a provider to establish care? No .   Dental Screening: Recommended annual dental exams for proper oral hygiene  Community Resource Referral / Chronic Care Management: CRR required this visit?  No   CCM required this visit?  No    Plan:     I have personally reviewed and noted the following in the patient's chart:   Medical and social history Use  of alcohol, tobacco or illicit drugs  Current medications and supplements including opioid prescriptions. Patient is not currently taking opioid prescriptions. Functional ability and status Nutritional status Physical activity Advanced directives List of other physicians Hospitalizations, surgeries, and ER visits in previous 12 months Vitals Screenings to include cognitive, depression, and falls Referrals and appointments  In addition, I have reviewed and discussed with patient certain preventive protocols, quality metrics, and best practice recommendations. A written personalized care plan for preventive services as well as general preventive health recommendations were provided to patient.    Sue Lush, LPN   09/14/8117   Nurse Notes: Pt is HOH during visit, she states she wears hearing aides but still could not hear well . Pt states she is doing well and has no concerns or questions.

## 2022-10-26 ENCOUNTER — Other Ambulatory Visit: Payer: Self-pay | Admitting: Family Medicine

## 2022-10-26 ENCOUNTER — Telehealth: Payer: Self-pay | Admitting: Family Medicine

## 2022-10-26 DIAGNOSIS — F419 Anxiety disorder, unspecified: Secondary | ICD-10-CM

## 2022-10-26 NOTE — Telephone Encounter (Signed)
This is a duplicate request, already requested by pharmacy today.

## 2022-10-26 NOTE — Telephone Encounter (Signed)
Copied from CRM [id number]. Topic: General - Other >> Bouyer 20, 2024 12:45 PM Everette C wrote: Reason for CRM: Medication Refill - Medication: ALPRAZolam (XANAX) 0.25 MG tablet [[id number]]  Has the patient contacted their pharmacy? Yes.   (Agent: If no, request that the patient contact the pharmacy for the refill. If patient does not wish to contact the pharmacy document the reason why and proceed with request.) (Agent: If yes, when and what did the pharmacy advise?)  Preferred Pharmacy (with phone number or street name): [facility] - [place], [place] - [address] [place] [address] Phone: [phone] Fax: [phone] Hours: Not open 24 hours   Has the patient been seen for an appointment in the last year OR does the patient have an upcoming appointment? Yes.    Agent: Please be advised that RX refills [place] take up to 3 business days. We ask that you follow-up with your pharmacy.

## 2022-10-27 NOTE — Telephone Encounter (Signed)
Requested medication (s) are due for refill today - yes  Requested medication (s) are on the active medication list -yes  Future visit scheduled -no  Last refill: 06/29/22 #90 1RF  Notes to clinic: non delegated Rx  Requested Prescriptions  Pending Prescriptions Disp Refills   ALPRAZolam (XANAX) 0.25 MG tablet [Pharmacy Med Name: ALPRAZOLAM 0.25 MG TAB] 90 tablet     Sig: TAKE ONE TABLET BY MOUTH 3 TIMES DAILY AS NEEDED FOR ANXIETY     Not Delegated - Psychiatry: Anxiolytics/Hypnotics 2 Failed - 10/26/2022 10:17 AM      Failed - This refill cannot be delegated      Failed - Urine Drug Screen completed in last 360 days      Passed - Patient is not pregnant      Passed - Valid encounter within last 6 months    Recent Outpatient Visits           4 months ago Medicare annual wellness visit, subsequent   [place] [facility] Merita Norton T, FNP   1 year ago Hypertensive urgency   [place] [facility] Merita Norton T, FNP   1 year ago Need for influenza vaccination   [place] [facility] Jacky Kindle, FNP   1 year ago Essential (primary) hypertension   [place] [facility] [place], Marzella Schlein, MD   1 year ago Hypertensive urgency   [place] [facility] Chrismon, Jodell [place], [place]                 Requested Prescriptions  Pending Prescriptions Disp Refills   ALPRAZolam (XANAX) 0.25 MG tablet [Pharmacy Med Name: ALPRAZOLAM 0.25 MG TAB] 90 tablet     Sig: TAKE ONE TABLET BY MOUTH 3 TIMES DAILY AS NEEDED FOR ANXIETY     Not Delegated - Psychiatry: Anxiolytics/Hypnotics 2 Failed - 10/26/2022 10:17 AM      Failed - This refill cannot be delegated      Failed - Urine Drug Screen completed in last 360 days      Passed - Patient is not pregnant      Passed - Valid encounter within last 6 months    Recent Outpatient Visits           4 months ago Medicare annual wellness visit,  subsequent   [place] [facility] Merita Norton T, FNP   1 year ago Hypertensive urgency   [place] [facility] Jacky Kindle, FNP   1 year ago Need for influenza vaccination   [facility] Jacky Kindle, FNP   1 year ago Essential (primary) hypertension   [place] [facility] [place], Marzella Schlein, MD   1 year ago Hypertensive urgency   [place] [facility] Chrismon, Jodell [place], [place]

## 2023-01-20 ENCOUNTER — Other Ambulatory Visit: Payer: Self-pay | Admitting: Family Medicine

## 2023-01-20 DIAGNOSIS — F419 Anxiety disorder, unspecified: Secondary | ICD-10-CM

## 2023-01-20 NOTE — Telephone Encounter (Signed)
Medication Refill - Medication: ALPRAZolam (XANAX) 0.25 MG tablet   Has the patient contacted their pharmacy? No. Caller was advised by practice to request medication on my chart. Caller is unable to access patient my chart. Patient has 1 tablet   Preferred Pharmacy (with phone number or street name): [facility] - [place], [place] - [address] Phone: [phone]  Fax: [phone]     Has the patient been seen for an appointment in the last year OR does the patient have an upcoming appointment? Yes.    Agent: Please be advised that RX refills [place] take up to 3 business days. We ask that you follow-up with your pharmacy.

## 2023-01-21 MED ORDER — ALPRAZOLAM 0.25 MG PO TABS: 0.2500 mg | ORAL_TABLET | Freq: Every day | ORAL | 0 refills | Status: DC | PRN

## 2023-01-21 NOTE — Telephone Encounter (Signed)
Requested medication (s) are due for refill today - yes  Requested medication (s) are on the active medication list -yes  Future visit scheduled -no  Last refill: 10/27/22 #90   Notes to clinic: non delegated Rx  Requested Prescriptions  Pending Prescriptions Disp Refills   ALPRAZolam (XANAX) 0.25 MG tablet 90 tablet 0     Not Delegated - Psychiatry: Anxiolytics/Hypnotics 2 Failed - 01/20/2023  5:18 PM      Failed - This refill cannot be delegated      Failed - Urine Drug Screen completed in last 360 days      Passed - Patient is not pregnant      Passed - Valid encounter within last 6 months    Recent Outpatient Visits           6 months ago Medicare annual wellness visit, subsequent   [facility] Merita Norton T, FNP   1 year ago Hypertensive urgency   [place] [facility] Merita Norton T, FNP   1 year ago Need for influenza vaccination   [place] [facility] Jacky Kindle, FNP   2 years ago Essential (primary) hypertension   [place] [facility] [place], Marzella Schlein, MD   2 years ago Hypertensive urgency   [place] [facility] Chrismon, Jodell [place], [place]                 Requested Prescriptions  Pending Prescriptions Disp Refills   ALPRAZolam (XANAX) 0.25 MG tablet 90 tablet 0     Not Delegated - Psychiatry: Anxiolytics/Hypnotics 2 Failed - 01/20/2023  5:18 PM      Failed - This refill cannot be delegated      Failed - Urine Drug Screen completed in last 360 days      Passed - Patient is not pregnant      Passed - Valid encounter within last 6 months    Recent Outpatient Visits           6 months ago Medicare annual wellness visit, subsequent   [facility] Merita Norton T, FNP   1 year ago Hypertensive urgency   [place] [facility] Jacky Kindle, FNP   1 year ago Need for influenza vaccination   [facility] Jacky Kindle, FNP   2 years ago Essential (primary) hypertension   [place] [facility] [place], Marzella Schlein, MD   2 years ago Hypertensive urgency   [facility] Chrismon, Jodell [place], [place]

## 2023-01-26 ENCOUNTER — Encounter: Payer: Self-pay | Admitting: Family Medicine

## 2023-01-26 ENCOUNTER — Ambulatory Visit (INDEPENDENT_AMBULATORY_CARE_PROVIDER_SITE_OTHER): Payer: PPO | Admitting: Family Medicine

## 2023-01-26 VITALS — BP 162/62 | HR 88 | Wt 153.5 lb

## 2023-01-26 DIAGNOSIS — F02A0 Dementia in other diseases classified elsewhere, mild, without behavioral disturbance, psychotic disturbance, mood disturbance, and anxiety: Secondary | ICD-10-CM | POA: Diagnosis not present

## 2023-01-26 DIAGNOSIS — G301 Alzheimer's disease with late onset: Secondary | ICD-10-CM | POA: Diagnosis not present

## 2023-01-26 DIAGNOSIS — Z23 Encounter for immunization: Secondary | ICD-10-CM

## 2023-01-26 DIAGNOSIS — F419 Anxiety disorder, unspecified: Secondary | ICD-10-CM

## 2023-01-26 DIAGNOSIS — F5104 Psychophysiologic insomnia: Secondary | ICD-10-CM

## 2023-01-26 DIAGNOSIS — I10 Essential (primary) hypertension: Secondary | ICD-10-CM

## 2023-01-26 MED ORDER — DONEPEZIL HCL 10 MG PO TABS
10.0000 mg | ORAL_TABLET | Freq: Every day | ORAL | 3 refills | Status: DC
Start: 2023-01-26 — End: 2023-07-07

## 2023-01-26 MED ORDER — AMLODIPINE BESYLATE 10 MG PO TABS: 10.0000 mg | ORAL_TABLET | Freq: Every day | ORAL | 3 refills | Status: DC

## 2023-01-26 MED ORDER — LOSARTAN POTASSIUM 100 MG PO TABS
100.0000 mg | ORAL_TABLET | Freq: Every day | ORAL | 3 refills | Status: DC
Start: 2023-01-26 — End: 2023-07-07

## 2023-01-26 MED ORDER — SERTRALINE HCL 100 MG PO TABS
100.0000 mg | ORAL_TABLET | Freq: Every day | ORAL | 3 refills | Status: DC
Start: 2023-01-26 — End: 2023-07-07

## 2023-01-26 MED ORDER — ALPRAZOLAM 0.25 MG PO TABS: 0.2500 mg | ORAL_TABLET | Freq: Three times a day (TID) | ORAL | 5 refills | Status: DC | PRN

## 2023-01-26 MED ORDER — TRAZODONE HCL 150 MG PO TABS
150.0000 mg | ORAL_TABLET | Freq: Every day | ORAL | 3 refills | Status: DC
Start: 2023-01-26 — End: 2023-07-07

## 2023-01-26 NOTE — Assessment & Plan Note (Signed)
Chronic; stable Pt is aware of risks of psychoactive medication use to include increased sedation, respiratory suppression, falls, extrapyramidal movements,  dependence and cardiovascular events.  Pt would like to continue treatment as benefit determined to outweigh risk.   Patient and family wish to continue on low dose xanax TID PRN 0.25 mg dosing to assist with cognitive concerns and anxiety iso alzheimer's dementia

## 2023-01-26 NOTE — Assessment & Plan Note (Signed)
Chronic; stable Continues on zoloft 100 mg, low dose xanax 0.25 mg TID PRN and trazodone 150 mg at bedtime Also continues on aricept 10 mg daily; has assistance of family and an in home caregiver x4-5 hours/day to assist

## 2023-01-26 NOTE — Assessment & Plan Note (Signed)
Chronic, borderline Defer medication changes given c/f age related debility and r/f falls Continue losartan 100 and norvasc 10

## 2023-01-26 NOTE — Progress Notes (Signed)
Established patient visit   Patient: Jordan Gordon   DOB: [birth year]   89 y.o. Female  MRN: [id number] Visit Date: 01/26/2023  Today's healthcare provider: Jacky Kindle, FNP  Introduced to nurse practitioner role and practice setting.  All questions answered.  Discussed provider/patient relationship and expectations.  Subjective    Medication Refill    Request for chronic medication refills; has a helper 4-5 hours per day. Working on daily chores, walking, and getting out to Bible study.  Family with no complaints.  Medications: Outpatient Medications Prior to Visit  Medication Sig   [DISCONTINUED] ALPRAZolam (XANAX) 0.25 MG tablet Take 1 tablet (0.25 mg total) by mouth daily as needed for anxiety. Courtesy refill; due for an office appt. (Patient taking differently: Take 0.25 mg by mouth daily as needed for anxiety. Courtesy refill)   [DISCONTINUED] amLODipine (NORVASC) 10 MG tablet Take 1 tablet (10 mg total) by mouth daily.   [DISCONTINUED] donepezil (ARICEPT) 10 MG tablet Take 1 tablet (10 mg total) by mouth at bedtime.   [DISCONTINUED] dorzolamide-timolol (COSOPT) 22.3-6.8 MG/ML ophthalmic solution 1 drop 2 (two) times daily.   [DISCONTINUED] losartan (COZAAR) 100 MG tablet Take 1 tablet (100 mg total) by mouth daily.   [DISCONTINUED] Netarsudil-Latanoprost (ROCKLATAN) 0.02-0.005 % SOLN    [DISCONTINUED] sertraline (ZOLOFT) 100 MG tablet Take 1 tablet (100 mg total) by mouth daily.   [DISCONTINUED] traZODone (DESYREL) 150 MG tablet Take 1 tablet (150 mg total) by mouth at bedtime.   [DISCONTINUED] triamterene-hydrochlorothiazide (MAXZIDE-25) 37.5-25 MG tablet Take 1 tablet by mouth daily.   No facility-administered medications prior to visit.    Review of Systems    Objective    BP (!) 162/62   Pulse 88   Wt 153 lb 8 oz (69.6 kg)   SpO2 100%   BMI 27.19 kg/m   Physical Exam Vitals and nursing note reviewed.  Constitutional:      General: She is not in acute  distress.    Appearance: Normal appearance. She is normal weight. She is not ill-appearing, toxic-appearing or diaphoretic.  HENT:     Head: Normocephalic and atraumatic.     Ears:     Comments: Chronic decreased hearing; use of artifical hearing aides to assist  Cardiovascular:     Rate and Rhythm: Normal rate and regular rhythm.     Pulses: Normal pulses.     Heart sounds: Normal heart sounds. No murmur heard.    No friction rub. No gallop.  Pulmonary:     Effort: Pulmonary effort is normal. No respiratory distress.     Breath sounds: Normal breath sounds. No stridor. No wheezing, rhonchi or rales.  Chest:     Chest wall: No tenderness.  Musculoskeletal:        General: No swelling, tenderness, deformity or signs of injury. Normal range of motion.     Right lower leg: No edema.     Left lower leg: No edema.  Skin:    General: Skin is warm and dry.     Capillary Refill: Capillary refill takes less than 2 seconds.     Coloration: Skin is not jaundiced or pale.     Findings: No bruising, erythema, lesion or rash.  Neurological:     Mental Status: She is alert and oriented to person, place, and time. Mental status is at baseline.     Cranial Nerves: No cranial nerve deficit.     Sensory: No sensory deficit.     Motor: No  weakness.     Coordination: Coordination normal.     Comments: Some forgetfulness; stable per family report  Psychiatric:        Mood and Affect: Mood normal.        Behavior: Behavior normal.        Thought Content: Thought content normal.        Judgment: Judgment normal.     No results found for any visits on 01/26/23.  Assessment & Plan     Problem List Items Addressed This Visit       Cardiovascular and Mediastinum   Essential (primary) hypertension    Chronic, borderline Defer medication changes given c/f age related debility and r/f falls Continue losartan 100 and norvasc 10       Relevant Medications   amLODipine (NORVASC) 10 MG tablet    losartan (COZAAR) 100 MG tablet     Nervous and Auditory   Mild late onset Alzheimer's dementia without behavioral disturbance, psychotic disturbance, mood disturbance, or anxiety (HCC)    Chronic; stable Continues on zoloft 100 mg, low dose xanax 0.25 mg TID PRN and trazodone 150 mg at bedtime Also continues on aricept 10 mg daily; has assistance of family and an in home caregiver x4-5 hours/day to assist       Relevant Medications   ALPRAZolam (XANAX) 0.25 MG tablet   donepezil (ARICEPT) 10 MG tablet   sertraline (ZOLOFT) 100 MG tablet   traZODone (DESYREL) 150 MG tablet     Other   Anxiety    Chronic; stable Pt is aware of risks of psychoactive medication use to include increased sedation, respiratory suppression, falls, extrapyramidal movements,  dependence and cardiovascular events.  Pt would like to continue treatment as benefit determined to outweigh risk.   Patient and family wish to continue on low dose xanax TID PRN 0.25 mg dosing to assist with cognitive concerns and anxiety iso alzheimer's dementia       Relevant Medications   ALPRAZolam (XANAX) 0.25 MG tablet   sertraline (ZOLOFT) 100 MG tablet   traZODone (DESYREL) 150 MG tablet   Insomnia    Chronic; stable Continue on trazodone at 150 mg at bedtime Continue to monitor c/f excess sedation or r/f increase mobility issues      Relevant Medications   ALPRAZolam (XANAX) 0.25 MG tablet   traZODone (DESYREL) 150 MG tablet   Other Visit Diagnoses     Immunization due    -  Primary   Relevant Orders   Flu Vaccine QUAD High Dose(Fluad) (Completed)      Return in about 6 months (around 07/29/2023) for annual examination.     Leilani Merl, FNP, have reviewed all documentation for this visit. The documentation on 01/26/23 for the exam, diagnosis, procedures, and orders are all accurate and complete.  Jacky Kindle, FNP  [facility] [phone] (phone) [phone] (fax)  [facility]

## 2023-01-26 NOTE — Assessment & Plan Note (Signed)
Chronic; stable Continue on trazodone at 150 mg at bedtime Continue to monitor c/f excess sedation or r/f increase mobility issues

## 2023-06-29 ENCOUNTER — Telehealth: Payer: Self-pay | Admitting: Family Medicine

## 2023-06-29 NOTE — Telephone Encounter (Signed)
traZODone (DESYREL) 150 MG tablet 90 tablet 3 01/26/2023 --   Sig - Route: Take 1 tablet (150 mg total) by mouth at bedtime. - Oral   Sent to pharmacy as: traZODone (DESYREL) 150 MG tablet   E-Prescribing Status: Receipt confirmed by pharmacy (01/26/2023  2:09 PM EDT)    Yr supply given does not need refill until aug.

## 2023-06-29 NOTE — Telephone Encounter (Signed)
[facility] requesting refill traZODone (DESYREL) 150 MG tablet  Please advise

## 2023-07-07 ENCOUNTER — Ambulatory Visit (INDEPENDENT_AMBULATORY_CARE_PROVIDER_SITE_OTHER): Payer: PPO | Admitting: Family Medicine

## 2023-07-07 VITALS — BP 153/73 | HR 87 | Resp 16 | Ht 63.0 in | Wt 150.7 lb

## 2023-07-07 DIAGNOSIS — F02A0 Dementia in other diseases classified elsewhere, mild, without behavioral disturbance, psychotic disturbance, mood disturbance, and anxiety: Secondary | ICD-10-CM

## 2023-07-07 DIAGNOSIS — I10 Essential (primary) hypertension: Secondary | ICD-10-CM

## 2023-07-07 DIAGNOSIS — F5104 Psychophysiologic insomnia: Secondary | ICD-10-CM

## 2023-07-07 DIAGNOSIS — F419 Anxiety disorder, unspecified: Secondary | ICD-10-CM

## 2023-07-07 DIAGNOSIS — G301 Alzheimer's disease with late onset: Secondary | ICD-10-CM | POA: Diagnosis not present

## 2023-07-07 DIAGNOSIS — N3941 Urge incontinence: Secondary | ICD-10-CM

## 2023-07-07 DIAGNOSIS — Z09 Encounter for follow-up examination after completed treatment for conditions other than malignant neoplasm: Secondary | ICD-10-CM

## 2023-07-07 MED ORDER — DONEPEZIL HCL 10 MG PO TABS: 10.0000 mg | ORAL_TABLET | Freq: Every day | ORAL | 3 refills | Status: DC

## 2023-07-07 MED ORDER — AMLODIPINE BESYLATE 10 MG PO TABS
10.0000 mg | ORAL_TABLET | Freq: Every day | ORAL | 3 refills | Status: DC
Start: 2023-07-07 — End: 2023-08-09

## 2023-07-07 MED ORDER — ALPRAZOLAM 0.25 MG PO TABS
0.2500 mg | ORAL_TABLET | Freq: Three times a day (TID) | ORAL | 5 refills | Status: DC | PRN
Start: 2023-07-07 — End: 2024-01-05

## 2023-07-07 MED ORDER — LOSARTAN POTASSIUM 100 MG PO TABS
100.0000 mg | ORAL_TABLET | Freq: Every day | ORAL | 3 refills | Status: DC
Start: 2023-07-07 — End: 2024-03-27

## 2023-07-07 MED ORDER — SERTRALINE HCL 100 MG PO TABS: 100.0000 mg | ORAL_TABLET | Freq: Every day | ORAL | 3 refills | Status: DC

## 2023-07-07 MED ORDER — TRAZODONE HCL 150 MG PO TABS: 150.0000 mg | ORAL_TABLET | Freq: Every day | ORAL | 3 refills | Status: AC

## 2023-07-07 MED ORDER — OXYBUTYNIN CHLORIDE ER 5 MG PO TB24: 5.0000 mg | ORAL_TABLET | Freq: Every day | ORAL | 2 refills | Status: DC

## 2023-07-07 NOTE — Progress Notes (Signed)
Established patient visit   Patient: Jordan Gordon   DOB: 1934-05-05   89 y.o. Female  MRN: [id number] Visit Date: 07/07/2023  Today's healthcare provider: Sherlyn Hay, DO   Chief Complaint  Patient presents with   Hypertension   Subjective    HPI The patient is an 89 year old female with hypertension and dementia who presents with increased agitation and sleep disturbances. She is accompanied by her son, Virl Diamond, who is her primary caregiver.  She is experiencing increased agitation, which persists despite being on trazodone. She becomes easily agitated over minor issues, such as wearing mismatched shoes. Her history of independence and the struggle with its loss [place] contribute to her agitation. She is currently taking Xanax three times a day, with her son administering her medications to ensure proper dosing.  She has difficulty sleeping at night, potentially due to daytime napping. Her activity levels have decreased, leading to increased sedentary behavior. She has been out of trazodone for two to three days, which Buys have exacerbated her sleep issues and agitation. She has a Comptroller during the day for six to eight hours and lives alone at night, with her son residing nearby.  She experiences frequent urination, characterized by small amounts multiple times a day. She does not wear Depends at home but does when going out, which she finds embarrassing. She has not tried any medication for this issue before. No urinary incontinence with coughing is reported.  She eats well and maintains a good appetite. She participates in some activities, such as attending church, grocery shopping, and weekly hair appointments, but her overall activity level has decreased. She used to enjoy line dancing but no longer participates.     Medications: Outpatient Medications Prior to Visit  Medication Sig   aspirin EC 81 MG tablet Take 81 mg by mouth daily. Swallow whole.   [DISCONTINUED]  ALPRAZolam (XANAX) 0.25 MG tablet Take 1 tablet (0.25 mg total) by mouth 3 (three) times daily as needed for anxiety.   [DISCONTINUED] amLODipine (NORVASC) 10 MG tablet Take 1 tablet (10 mg total) by mouth daily.   [DISCONTINUED] donepezil (ARICEPT) 10 MG tablet Take 1 tablet (10 mg total) by mouth at bedtime.   [DISCONTINUED] losartan (COZAAR) 100 MG tablet Take 1 tablet (100 mg total) by mouth daily.   [DISCONTINUED] sertraline (ZOLOFT) 100 MG tablet Take 1 tablet (100 mg total) by mouth daily.   [DISCONTINUED] traZODone (DESYREL) 150 MG tablet Take 1 tablet (150 mg total) by mouth at bedtime.   No facility-administered medications prior to visit.        Objective    BP (!) 153/73   Pulse 87   Resp 16   Ht 5\' 3"  (1.6 m)   Wt 150 lb 11.2 oz (68.4 kg)   SpO2 99%   BMI 26.70 kg/m     Physical Exam Constitutional:      Appearance: Normal appearance.  HENT:     Head: Normocephalic and atraumatic.  Eyes:     General: No scleral icterus.    Extraocular Movements: Extraocular movements intact.     Conjunctiva/sclera: Conjunctivae normal.  Cardiovascular:     Rate and Rhythm: Normal rate and regular rhythm.     Pulses: Normal pulses.  Pulmonary:     Effort: Pulmonary effort is normal. No respiratory distress.     Breath sounds: Normal breath sounds.  Abdominal:     General: Bowel sounds are normal. There is no distension.  Palpations: Abdomen is soft. There is no mass.     Tenderness: There is no abdominal tenderness. There is no guarding.  Musculoskeletal:     Right lower leg: No edema.     Left lower leg: No edema.  Skin:    General: Skin is warm and dry.  Neurological:     Mental Status: She is alert and oriented to person, place, and time. Mental status is at baseline.  Psychiatric:        Mood and Affect: Mood normal.        Behavior: Behavior normal.      No results found for any visits on 07/07/23.  Assessment & Plan    Follow-up exam, 3-6 months since  previous exam  Essential (primary) hypertension Assessment & Plan: Elevated blood pressure. Decision to avoid increasing medications due to fall risk remains appropriate. Patient and caregiver agree with this plan. - Continue current antihypertensive regimen - Monitor blood pressure at home  Orders: -     Losartan Potassium; Take 1 tablet (100 mg total) by mouth daily.  Dispense: 90 tablet; Refill: 3 -     amLODIPine Besylate; Take 1 tablet (10 mg total) by mouth daily.  Dispense: 90 tablet; Refill: 3  Mild late onset Alzheimer's dementia without behavioral disturbance, psychotic disturbance, mood disturbance, or anxiety (HCC) Assessment & Plan: Increased agitation, possibly related to running out of trazodone. Persistent agitation even on trazodone. Potential dementia-related agitation discussed. Caregiver reports agitation over minor issues, possibly due to patient's history of independence. - Renew trazodone prescription - Consider non-pharmacological interventions for agitation  Orders: -     Sertraline HCl; Take 1 tablet (100 mg total) by mouth daily.  Dispense: 90 tablet; Refill: 3 -     Donepezil HCl; Take 1 tablet (10 mg total) by mouth at bedtime.  Dispense: 90 tablet; Refill: 3 -     ALPRAZolam; Take 1 tablet (0.25 mg total) by mouth 3 (three) times daily as needed for anxiety.  Dispense: 90 tablet; Refill: 5  Psychophysiological insomnia Assessment & Plan: Difficulty sleeping at night, possibly due to excessive daytime napping and decreased activity levels. Caregiver encourages more physical activity, but patient is resistant. Discussed importance of daytime activity to improve nighttime sleep. - Encourage increased daytime activity - Consider sleep hygiene education  Orders: -     traZODone HCl; Take 1 tablet (150 mg total) by mouth at bedtime.  Dispense: 90 tablet; Refill: 3 -     ALPRAZolam; Take 1 tablet (0.25 mg total) by mouth 3 (three) times daily as needed for  anxiety.  Dispense: 90 tablet; Refill: 5  Urge incontinence of urine Assessment & Plan: Frequent urination with small amounts. No incontinence with coughing. Discussed potential benefits of medication for urinary frequency. Patient feels embarrassed to wear Depends, affecting willingness to go out. - Prescribe medication for urinary frequency (once daily) - Follow up in one month if issues persist  Orders: -     oxyBUTYnin Chloride ER; Take 1 tablet (5 mg total) by mouth at bedtime.  Dispense: 30 tablet; Refill: 2  Anxiety Assessment & Plan: Stable. Refilled medications as noted below.  Orders: -     Sertraline HCl; Take 1 tablet (100 mg total) by mouth daily.  Dispense: 90 tablet; Refill: 3 -     ALPRAZolam; Take 1 tablet (0.25 mg total) by mouth 3 (three) times daily as needed for anxiety.  Dispense: 90 tablet; Refill: 5  General Health Maintenance Received flu shot in  August. Declined shingles and COVID vaccines. Caregiver requests renewal of all current medications for six months. - Renew all current medications for six months - Plan for six-month follow-up unless issues arise - Defer blood work to next visit  Follow-up - Follow up in six months if stable - Call and schedule appointment in one month if urinary frequency medication is not effective.   Return in about 6 months (around 01/04/2024), or if symptoms worsen or fail to improve, for chronic f/u.      I discussed the assessment and treatment plan with the patient  The patient was provided an opportunity to ask questions and all were answered. The patient agreed with the plan and demonstrated an understanding of the instructions.   The patient was advised to call back or seek an in-person evaluation if the symptoms worsen or if the condition fails to improve as anticipated.    Sherlyn Hay, DO  [facility] [phone] (phone) [phone] (fax)  [facility]

## 2023-07-11 ENCOUNTER — Encounter: Payer: Self-pay | Admitting: Family Medicine

## 2023-07-11 DIAGNOSIS — N3941 Urge incontinence: Secondary | ICD-10-CM | POA: Insufficient documentation

## 2023-07-11 NOTE — Assessment & Plan Note (Signed)
Increased agitation, possibly related to running out of trazodone. Persistent agitation even on trazodone. Potential dementia-related agitation discussed. Caregiver reports agitation over minor issues, possibly due to patient's history of independence. - Renew trazodone prescription - Consider non-pharmacological interventions for agitation

## 2023-07-11 NOTE — Assessment & Plan Note (Signed)
Difficulty sleeping at night, possibly due to excessive daytime napping and decreased activity levels. Caregiver encourages more physical activity, but patient is resistant. Discussed importance of daytime activity to improve nighttime sleep. - Encourage increased daytime activity - Consider sleep hygiene education

## 2023-07-11 NOTE — Assessment & Plan Note (Signed)
Stable. Refilled medications as noted below.

## 2023-07-11 NOTE — Assessment & Plan Note (Signed)
Elevated blood pressure. Decision to avoid increasing medications due to fall risk remains appropriate. Patient and caregiver agree with this plan. - Continue current antihypertensive regimen - Monitor blood pressure at home

## 2023-07-11 NOTE — Assessment & Plan Note (Signed)
Frequent urination with small amounts. No incontinence with coughing. Discussed potential benefits of medication for urinary frequency. Patient feels embarrassed to wear Depends, affecting willingness to go out. - Prescribe medication for urinary frequency (once daily) - Follow up in one month if issues persist

## 2023-08-02 ENCOUNTER — Encounter: Payer: Self-pay | Admitting: Internal Medicine

## 2023-08-02 ENCOUNTER — Other Ambulatory Visit: Payer: Self-pay

## 2023-08-02 ENCOUNTER — Inpatient Hospital Stay
Admission: EM | Admit: 2023-08-02 | Discharge: 2023-08-09 | DRG: 871 | Disposition: A | Discharge status: Home Health | Payer: PPO | Attending: Student | Admitting: Student

## 2023-08-02 ENCOUNTER — Emergency Department: Payer: PPO

## 2023-08-02 DIAGNOSIS — R0902 Hypoxemia: Secondary | ICD-10-CM | POA: Diagnosis not present

## 2023-08-02 DIAGNOSIS — R410 Disorientation, unspecified: Secondary | ICD-10-CM | POA: Diagnosis not present

## 2023-08-02 DIAGNOSIS — W19XXXA Unspecified fall, initial encounter: Principal | ICD-10-CM

## 2023-08-02 DIAGNOSIS — J111 Influenza due to unidentified influenza virus with other respiratory manifestations: Secondary | ICD-10-CM | POA: Diagnosis present

## 2023-08-02 DIAGNOSIS — R0689 Other abnormalities of breathing: Secondary | ICD-10-CM | POA: Diagnosis not present

## 2023-08-02 DIAGNOSIS — R051 Acute cough: Secondary | ICD-10-CM

## 2023-08-02 DIAGNOSIS — Z789 Other specified health status: Secondary | ICD-10-CM

## 2023-08-02 DIAGNOSIS — J101 Influenza due to other identified influenza virus with other respiratory manifestations: Secondary | ICD-10-CM | POA: Diagnosis not present

## 2023-08-02 DIAGNOSIS — R000 Tachycardia, unspecified: Secondary | ICD-10-CM | POA: Diagnosis not present

## 2023-08-02 DIAGNOSIS — K219 Gastro-esophageal reflux disease without esophagitis: Secondary | ICD-10-CM | POA: Diagnosis present

## 2023-08-02 DIAGNOSIS — E785 Hyperlipidemia, unspecified: Secondary | ICD-10-CM | POA: Diagnosis present

## 2023-08-02 DIAGNOSIS — F329 Major depressive disorder, single episode, unspecified: Secondary | ICD-10-CM | POA: Diagnosis present

## 2023-08-02 DIAGNOSIS — Z7982 Long term (current) use of aspirin: Secondary | ICD-10-CM

## 2023-08-02 DIAGNOSIS — I2510 Atherosclerotic heart disease of native coronary artery without angina pectoris: Secondary | ICD-10-CM | POA: Diagnosis present

## 2023-08-02 DIAGNOSIS — Z8249 Family history of ischemic heart disease and other diseases of the circulatory system: Secondary | ICD-10-CM

## 2023-08-02 DIAGNOSIS — Z90710 Acquired absence of both cervix and uterus: Secondary | ICD-10-CM

## 2023-08-02 DIAGNOSIS — I700 Atherosclerosis of aorta: Secondary | ICD-10-CM | POA: Diagnosis not present

## 2023-08-02 DIAGNOSIS — E876 Hypokalemia: Secondary | ICD-10-CM | POA: Diagnosis present

## 2023-08-02 DIAGNOSIS — F0390 Unspecified dementia without behavioral disturbance: Secondary | ICD-10-CM | POA: Diagnosis present

## 2023-08-02 DIAGNOSIS — J209 Acute bronchitis, unspecified: Secondary | ICD-10-CM | POA: Diagnosis present

## 2023-08-02 DIAGNOSIS — A4189 Other specified sepsis: Principal | ICD-10-CM | POA: Diagnosis present

## 2023-08-02 DIAGNOSIS — R0989 Other specified symptoms and signs involving the circulatory and respiratory systems: Secondary | ICD-10-CM | POA: Diagnosis not present

## 2023-08-02 DIAGNOSIS — Z66 Do not resuscitate: Secondary | ICD-10-CM | POA: Diagnosis present

## 2023-08-02 DIAGNOSIS — J440 Chronic obstructive pulmonary disease with acute lower respiratory infection: Secondary | ICD-10-CM | POA: Diagnosis present

## 2023-08-02 DIAGNOSIS — I10 Essential (primary) hypertension: Secondary | ICD-10-CM | POA: Diagnosis present

## 2023-08-02 DIAGNOSIS — Z79899 Other long term (current) drug therapy: Secondary | ICD-10-CM

## 2023-08-02 DIAGNOSIS — J9601 Acute respiratory failure with hypoxia: Secondary | ICD-10-CM | POA: Diagnosis present

## 2023-08-02 DIAGNOSIS — A419 Sepsis, unspecified organism: Secondary | ICD-10-CM | POA: Diagnosis not present

## 2023-08-02 DIAGNOSIS — S0990XA Unspecified injury of head, initial encounter: Secondary | ICD-10-CM | POA: Diagnosis not present

## 2023-08-02 DIAGNOSIS — F419 Anxiety disorder, unspecified: Secondary | ICD-10-CM | POA: Diagnosis present

## 2023-08-02 DIAGNOSIS — I6782 Cerebral ischemia: Secondary | ICD-10-CM | POA: Diagnosis not present

## 2023-08-02 DIAGNOSIS — J09X1 Influenza due to identified novel influenza A virus with pneumonia: Secondary | ICD-10-CM | POA: Insufficient documentation

## 2023-08-02 DIAGNOSIS — E538 Deficiency of other specified B group vitamins: Secondary | ICD-10-CM | POA: Diagnosis present

## 2023-08-02 LAB — COMPREHENSIVE METABOLIC PANEL
ALT: 26 U/L (ref 0–44)
AST: 47 U/L — ABNORMAL HIGH (ref 15–41)
Albumin: 3.8 g/dL (ref 3.5–5.0)
Alkaline Phosphatase: 80 U/L (ref 38–126)
Anion gap: 11 (ref 5–15)
BUN: 12 mg/dL (ref 8–23)
CO2: 23 mmol/L (ref 22–32)
Calcium: 8.6 mg/dL — ABNORMAL LOW (ref 8.9–10.3)
Chloride: 105 mmol/L (ref 98–111)
Creatinine, Ser: 0.92 mg/dL (ref 0.44–1.00)
GFR, Estimated: >60 mL/min (ref >60)
Glucose, Bld: 152 mg/dL — ABNORMAL HIGH (ref 70–99)
Potassium: 3.1 mmol/L — ABNORMAL LOW (ref 3.5–5.1)
Sodium: 139 mmol/L (ref 135–145)
Total Bilirubin: 0.8 mg/dL (ref 0.0–1.2)
Total Protein: 7.1 g/dL (ref 6.5–8.1)

## 2023-08-02 LAB — LACTIC ACID, PLASMA
Lactic Acid, Venous: 1.9 mmol/L (ref 0.5–1.9)
Lactic Acid, Venous: 2.3 mmol/L (ref 0.5–1.9)

## 2023-08-02 LAB — URINALYSIS, W/ REFLEX TO CULTURE (INFECTION SUSPECTED)
Bacteria, UA: NONE SEEN
Bilirubin Urine: NEGATIVE
Glucose, UA: NEGATIVE mg/dL
Ketones, ur: NEGATIVE mg/dL
Leukocytes,Ua: NEGATIVE
Nitrite: NEGATIVE
Protein, ur: 30 mg/dL — AB
RBC / HPF: 0 RBC/hpf (ref 0–5)
Specific Gravity, Urine: 1.013 (ref 1.005–1.030)
pH: 6 (ref 5.0–8.0)

## 2023-08-02 LAB — BLOOD GAS, VENOUS
Acid-Base Excess: 0.8 mmol/L (ref 0.0–2.0)
Bicarbonate: 26.6 mmol/L (ref 20.0–28.0)
O2 Saturation: 79.5 %
Patient temperature: 37
pCO2, Ven: 46 mm[Hg] (ref 44–60)
pH, Ven: 7.37 (ref 7.25–7.43)
pO2, Ven: 46 mm[Hg] — ABNORMAL HIGH (ref 32–45)

## 2023-08-02 LAB — CBC WITH DIFFERENTIAL/PLATELET
Abs Immature Granulocytes: 0.03 10*3/uL (ref 0.00–0.07)
Basophils Absolute: 0 10*3/uL (ref 0.0–0.1)
Basophils Relative: 0 %
Eosinophils Absolute: 0 10*3/uL (ref 0.0–0.5)
Eosinophils Relative: 0 %
HCT: 37.1 % (ref 36.0–46.0)
Hemoglobin: 12.9 g/dL (ref 12.0–15.0)
Immature Granulocytes: 1 %
Lymphocytes Relative: 17 %
Lymphs Abs: 0.9 10*3/uL (ref 0.7–4.0)
MCH: 29.3 pg (ref 26.0–34.0)
MCHC: 34.8 g/dL (ref 30.0–36.0)
MCV: 84.1 fL (ref 80.0–100.0)
Monocytes Absolute: 0.4 10*3/uL (ref 0.1–1.0)
Monocytes Relative: 8 %
Neutro Abs: 3.8 10*3/uL (ref 1.7–7.7)
Neutrophils Relative %: 74 %
Platelets: 138 10*3/uL — ABNORMAL LOW (ref 150–400)
RBC: 4.41 MIL/uL (ref 3.87–5.11)
RDW: 13 % (ref 11.5–15.5)
WBC: 5.2 10*3/uL (ref 4.0–10.5)
nRBC: 0 % (ref 0.0–0.2)

## 2023-08-02 LAB — PROTIME-INR
INR: 1.1 (ref 0.8–1.2)
Prothrombin Time: 14.3 s (ref 11.4–15.2)

## 2023-08-02 LAB — RESP PANEL BY RT-PCR (RSV, FLU A&B, COVID)  RVPGX2
Influenza A by PCR: POSITIVE — AB
Influenza B by PCR: NEGATIVE
Resp Syncytial Virus by PCR: NEGATIVE
SARS Coronavirus 2 by RT PCR: NEGATIVE

## 2023-08-02 LAB — TSH: TSH: 2.147 u[IU]/mL (ref 0.350–4.500)

## 2023-08-02 LAB — APTT: aPTT: 28 s (ref 24–36)

## 2023-08-02 LAB — BRAIN NATRIURETIC PEPTIDE: B Natriuretic Peptide: 394.6 pg/mL — ABNORMAL HIGH (ref 0.0–100.0)

## 2023-08-02 LAB — CK: Total CK: 987 U/L — ABNORMAL HIGH (ref 38–234)

## 2023-08-02 LAB — TROPONIN I (HIGH SENSITIVITY): Troponin I (High Sensitivity): 25 ng/L — ABNORMAL HIGH (ref <18)

## 2023-08-02 MED ORDER — ORAL CARE MOUTH RINSE: 15.0000 mL | OROMUCOSAL | Status: DC | PRN

## 2023-08-02 MED ORDER — MAGNESIUM SULFATE IN D5W 1-5 GM/100ML-% IV SOLN
1.0000 g | Freq: Once | INTRAVENOUS | Status: AC
  Administered 2023-08-02: 1 g via INTRAVENOUS
  Filled 2023-08-02: qty 100

## 2023-08-02 MED ORDER — IPRATROPIUM BROMIDE 0.02 % IN SOLN: 0.5000 mg | Freq: Four times a day (QID) | RESPIRATORY_TRACT | Status: DC | PRN

## 2023-08-02 MED ORDER — ONDANSETRON HCL 4 MG/2ML IJ SOLN: 4.0000 mg | Freq: Four times a day (QID) | INTRAMUSCULAR | Status: DC | PRN

## 2023-08-02 MED ORDER — POTASSIUM CHLORIDE CRYS ER 20 MEQ PO TBCR
40.0000 meq | EXTENDED_RELEASE_TABLET | Freq: Once | ORAL | Status: AC
  Administered 2023-08-02: 40 meq via ORAL
  Filled 2023-08-02: qty 2

## 2023-08-02 MED ORDER — SODIUM CHLORIDE 0.9 % IV SOLN
2.0000 g | Freq: Once | INTRAVENOUS | Status: AC
  Administered 2023-08-02: 2 g via INTRAVENOUS
  Filled 2023-08-02: qty 20

## 2023-08-02 MED ORDER — DONEPEZIL HCL 5 MG PO TABS
10.0000 mg | ORAL_TABLET | Freq: Every day | ORAL | Status: DC
Start: 2023-08-02 — End: 2023-08-09
  Administered 2023-08-02 – 2023-08-08 (×7): 10 mg via ORAL
  Filled 2023-08-02 (×7): qty 2

## 2023-08-02 MED ORDER — METHYLPREDNISOLONE SODIUM SUCC 125 MG IJ SOLR
125.0000 mg | Freq: Once | INTRAMUSCULAR | Status: AC
  Administered 2023-08-02: 125 mg via INTRAVENOUS
  Filled 2023-08-02: qty 2

## 2023-08-02 MED ORDER — PREDNISONE 20 MG PO TABS
20.0000 mg | ORAL_TABLET | Freq: Every day | ORAL | Status: DC
  Administered 2023-08-03: 20 mg via ORAL
  Filled 2023-08-02: qty 1

## 2023-08-02 MED ORDER — TRAZODONE HCL 50 MG PO TABS
150.0000 mg | ORAL_TABLET | Freq: Every day | ORAL | Status: DC
  Administered 2023-08-02 – 2023-08-08 (×7): 150 mg via ORAL
  Filled 2023-08-02 (×7): qty 1

## 2023-08-02 MED ORDER — ONDANSETRON HCL 4 MG PO TABS: 4.0000 mg | ORAL_TABLET | Freq: Four times a day (QID) | ORAL | Status: DC | PRN

## 2023-08-02 MED ORDER — OXYBUTYNIN CHLORIDE ER 5 MG PO TB24
5.0000 mg | ORAL_TABLET | Freq: Every day | ORAL | Status: DC
  Administered 2023-08-02 – 2023-08-08 (×7): 5 mg via ORAL
  Filled 2023-08-02 (×7): qty 1

## 2023-08-02 MED ORDER — ALPRAZOLAM 0.25 MG PO TABS
0.2500 mg | ORAL_TABLET | Freq: Three times a day (TID) | ORAL | Status: DC | PRN
  Administered 2023-08-04: 0.25 mg via ORAL
  Filled 2023-08-02: qty 1

## 2023-08-02 MED ORDER — LACTATED RINGERS IV BOLUS (SEPSIS)
1000.0000 mL | Freq: Once | INTRAVENOUS | Status: AC
  Administered 2023-08-02: 1000 mL via INTRAVENOUS

## 2023-08-02 MED ORDER — LOSARTAN POTASSIUM 50 MG PO TABS
100.0000 mg | ORAL_TABLET | Freq: Every day | ORAL | Status: DC
  Administered 2023-08-02 – 2023-08-09 (×8): 100 mg via ORAL
  Filled 2023-08-02 (×8): qty 2

## 2023-08-02 MED ORDER — IPRATROPIUM-ALBUTEROL 0.5-2.5 (3) MG/3ML IN SOLN
3.0000 mL | Freq: Four times a day (QID) | RESPIRATORY_TRACT | Status: DC
  Administered 2023-08-02 – 2023-08-04 (×8): 3 mL via RESPIRATORY_TRACT
  Filled 2023-08-02 (×8): qty 3

## 2023-08-02 MED ORDER — OSELTAMIVIR PHOSPHATE 75 MG PO CAPS
75.0000 mg | ORAL_CAPSULE | Freq: Once | ORAL | Status: AC
  Administered 2023-08-02: 75 mg via ORAL
  Filled 2023-08-02: qty 1

## 2023-08-02 MED ORDER — SERTRALINE HCL 50 MG PO TABS
100.0000 mg | ORAL_TABLET | Freq: Every day | ORAL | Status: DC
  Administered 2023-08-02 – 2023-08-09 (×8): 100 mg via ORAL
  Filled 2023-08-02 (×8): qty 2

## 2023-08-02 MED ORDER — BUDESONIDE 0.25 MG/2ML IN SUSP
0.2500 mg | Freq: Two times a day (BID) | RESPIRATORY_TRACT | Status: DC
  Administered 2023-08-02 – 2023-08-09 (×15): 0.25 mg via RESPIRATORY_TRACT
  Filled 2023-08-02 (×15): qty 2

## 2023-08-02 MED ORDER — OSELTAMIVIR PHOSPHATE 30 MG PO CAPS
30.0000 mg | ORAL_CAPSULE | Freq: Two times a day (BID) | ORAL | Status: AC
  Administered 2023-08-02 – 2023-08-07 (×10): 30 mg via ORAL
  Filled 2023-08-02 (×11): qty 1

## 2023-08-02 MED ORDER — AMLODIPINE BESYLATE 10 MG PO TABS
10.0000 mg | ORAL_TABLET | Freq: Every day | ORAL | Status: DC
  Administered 2023-08-02 – 2023-08-05 (×4): 10 mg via ORAL
  Filled 2023-08-02 (×2): qty 1
  Filled 2023-08-02: qty 2
  Filled 2023-08-02: qty 1

## 2023-08-02 MED ORDER — SODIUM CHLORIDE 0.9 % IV SOLN
500.0000 mg | Freq: Once | INTRAVENOUS | Status: AC
  Administered 2023-08-02: 500 mg via INTRAVENOUS
  Filled 2023-08-02: qty 5

## 2023-08-02 MED ORDER — LACTATED RINGERS IV SOLN: INTRAVENOUS | Status: DC

## 2023-08-02 MED ORDER — ENOXAPARIN SODIUM 40 MG/0.4ML IJ SOSY
40.0000 mg | PREFILLED_SYRINGE | INTRAMUSCULAR | Status: DC
  Administered 2023-08-02 – 2023-08-03 (×2): 40 mg via SUBCUTANEOUS
  Filled 2023-08-02 (×2): qty 0.4

## 2023-08-02 MED ORDER — ASPIRIN 81 MG PO TBEC
81.0000 mg | DELAYED_RELEASE_TABLET | Freq: Every day | ORAL | Status: DC
  Administered 2023-08-02 – 2023-08-09 (×8): 81 mg via ORAL
  Filled 2023-08-02 (×8): qty 1

## 2023-08-02 NOTE — ED Provider Notes (Addendum)
 [facility] Provider Note    Event Date/Time   First MD Initiated Contact with Patient 08/02/23 [phone]     (approximate)   History   Fall and Shortness of Breath   HPI  Jordan Gordon is a 89 y.o. female past medical history significant for hypertension, anxiety, who presents to the emergency department for a fall and shortness of breath.  Patient was called EMS for a fall that she had whenever trying to get up out of bed in between her bed and the wall.  Denies significant head injury or loss of consciousness.  States that she has not been feeling well for the past couple of days with cough and congestion.  Complaining of shortness of breath.  Denies any chest pain.  When EMS arrived patient was hypoxic in the low 80s and placed on a nonrebreather with improvement to 95%.  Was given a DuoNeb treatment prior to arrival.  Patient without any history of COPD or asthma.  Denies nausea or vomiting.  Denies dysuria, urinary urgency or frequency.     Physical Exam   Triage Vital Signs: ED Triage Vitals [08/02/23 0725]  Encounter Vitals Group     BP      Systolic BP Percentile      Diastolic BP Percentile      Pulse      Resp      Temp      Temp src      SpO2      Weight      Height      Head Circumference      Peak Flow      Pain Score 0     Pain Loc      Pain Education      Exclude from Growth Chart     Most recent vital signs: Vitals:   08/02/23 0800 08/02/23 0830  BP: (!) 160/64   Pulse: (!) 102 99  Resp: (!) 33 (!) 22  Temp:    SpO2: 94% 97%    Physical Exam Constitutional:      Appearance: She is well-developed.  HENT:     Head: Atraumatic.  Eyes:     Conjunctiva/sclera: Conjunctivae normal.  Cardiovascular:     Rate and Rhythm: Regular rhythm.  Pulmonary:     Effort: Tachypnea and respiratory distress present.     Breath sounds: Wheezing, rhonchi and rales present.     Comments: 3 L nasal cannula at 94% Abdominal:     General:  There is no distension.     Palpations: Abdomen is soft.     Tenderness: There is no abdominal tenderness.  Musculoskeletal:        General: Normal range of motion.     Cervical back: Normal range of motion.     Right lower leg: No edema.     Left lower leg: No edema.     Comments: +2 DP pulses that are equal bilaterally.  No midline cervical, thoracic or lumbar tenderness to palpation.  No tenderness to palpation to bilateral hips, upper or lower extremities.  Skin:    General: Skin is warm.     Capillary Refill: Capillary refill takes less than 2 seconds.  Neurological:     General: No focal deficit present.     Mental Status: She is alert. Mental status is at baseline.     IMPRESSION / MDM / ASSESSMENT AND PLAN / ED COURSE  I reviewed the triage vital  signs and the nursing notes.  On chart review patient does not have frequent hospital admissions, followed by [facility] family practice and was last evaluated for elevated blood pressure, patient is also in low dose of alprazolam.   Differential diagnosis including intracranial hemorrhage, infectious process including pneumonia, viral illness including COVID/influenza, ACS, hypertensive emergency, pulmonary edema  EKG  I, Corena Herter, the attending physician, personally viewed and interpreted this ECG.  Sinus tachycardia, significant artifact, normal intervals, no chamber enlargement.  No significant ST elevation or depression.  No signs of acute ischemia or dysrhythmia.  Sinus tachycardia while on cardiac telemetry.  RADIOLOGY I independently reviewed imaging, my interpretation of imaging: Chest x-ray no focal findings consistent with pneumonia no signs of pulmonary edema.  CT scan without signs of intracranial hemorrhage.  Read as no acute findings.  LABS (all labs ordered are listed, but only abnormal results are displayed) Labs interpreted as -    Labs Reviewed  RESP PANEL BY RT-PCR (RSV, FLU A&B, COVID)  RVPGX2 -  Abnormal; Notable for the following components:      Result Value   Influenza A by PCR POSITIVE (*)    All other components within normal limits  COMPREHENSIVE METABOLIC PANEL - Abnormal; Notable for the following components:   Potassium 3.1 (*)    Glucose, Bld 152 (*)    Calcium 8.6 (*)    AST 47 (*)    All other components within normal limits  CBC WITH DIFFERENTIAL/PLATELET - Abnormal; Notable for the following components:   Platelets 138 (*)    All other components within normal limits  BLOOD GAS, VENOUS - Abnormal; Notable for the following components:   pO2, Ven 46 (*)    All other components within normal limits  CK - Abnormal; Notable for the following components:   Total CK 987 (*)    All other components within normal limits  CULTURE, BLOOD (ROUTINE X 2)  CULTURE, BLOOD (ROUTINE X 2)  LACTIC ACID, PLASMA  PROTIME-INR  APTT  LACTIC ACID, PLASMA  BRAIN NATRIURETIC PEPTIDE  URINALYSIS, W/ REFLEX TO CULTURE (INFECTION SUSPECTED)     MDM  Blood cultures obtained.  Given concern for pneumonia with her tachycardia, tachypnea and acute hypoxia with cough and focal findings on exam we will treat the patient for community-acquired pneumonia with Rocephin and azithromycin.  Given 1 L of IV fluids and will reevaluate.  Lab work overall reassuring with normal lactic acid.  Mild hypokalemia at 3.1.  Influenza A positive.  Ordered a dose of Tamiflu.  Patient will need admitted for acute hypoxic respiratory failure in the setting of influenza.  Consulted hospitalist for admission.     PROCEDURES:  Critical Care performed: yes  .Critical Care  Performed by: Corena Herter, MD Authorized by: Corena Herter, MD   Critical care provider statement:    Critical care time (minutes):  30   Critical care time was exclusive of:  Separately billable procedures and treating other patients   Critical care was necessary to treat or prevent imminent or life-threatening deterioration of  the following conditions:  Sepsis   Critical care was time spent personally by me on the following activities:  Development of treatment plan with patient or surrogate, discussions with consultants, evaluation of patient's response to treatment, examination of patient, ordering and review of laboratory studies, ordering and review of radiographic studies, ordering and performing treatments and interventions, pulse oximetry, re-evaluation of patient's condition and review of old charts   Care discussed with:  admitting provider     Patient's presentation is most consistent with acute presentation with potential threat to life or bodily function.   MEDICATIONS ORDERED IN ED: Medications  lactated ringers infusion (has no administration in time range)  cefTRIAXone (ROCEPHIN) 2 g in sodium chloride 0.9 % 100 mL IVPB (has no administration in time range)  azithromycin (ZITHROMAX) 500 mg in sodium chloride 0.9 % 250 mL IVPB (500 mg Intravenous New Bag/Given 08/02/23 0850)  lactated ringers bolus 1,000 mL (1,000 mLs Intravenous New Bag/Given 08/02/23 0823)  oseltamivir (TAMIFLU) capsule 75 mg (has no administration in time range)    FINAL CLINICAL IMPRESSION(S) / ED DIAGNOSES   Final diagnoses:  Fall, initial encounter  Hypoxia  Acute cough  Influenza A     Rx / DC Orders   ED Discharge Orders     None        Note:  This document was prepared using Dragon voice recognition software and [employer] include unintentional dictation errors.   Corena Herter, MD 08/02/23 9147    Corena Herter, MD 08/02/23 [phone]

## 2023-08-02 NOTE — Evaluation (Signed)
 Physical Therapy Evaluation Patient Details Name: Jordan Gordon MRN: [id number] DOB: [birth year] Today's Date: 08/02/2023  History of Present Illness  Pt is an 89 y.o. female with medical history significant of advanced dementia, CAD, HTN, HLD, anxiety/depression, brought in by family member for increasing shortness of breath cough and fall, workup for fluA.   Clinical Impression  Pt alert, standing at EOB with family needing to go to the bathroom. PT provided BSC (pt urinated on floor) minA to step pivot to [facility]. Able to void and perform pericare without assistance. Sit <> stand and step pivot back to bed still required minA for steadying. At baseline per family/pt, no AD but pt does furniture walk. Pt did not recall previous fall that led to admission, has a caregiver/granddaughter 630-12 daily to assist with ADLs (meds, has meals on wheels, but pt endorsed standing and showering alone at baseline).  Overall the patient demonstrated deficits (see "PT Problem List") that impede the patient's functional abilities, safety, and mobility and would benefit from skilled PT intervention.   Pt SOB and wheezing with all exertion, Bowman in place throughout spO2 >90%.          If plan is discharge home, recommend the following: A little help with bathing/dressing/bathroom;Assistance with cooking/housework;Assist for transportation;Help with stairs or ramp for entrance;Direct supervision/assist for medications management   Can travel by private vehicle        Equipment Recommendations Rolling walker (2 wheels);BSC/3in1  Recommendations for Other Services       Functional Status Assessment Patient has had a recent decline in their functional status and demonstrates the ability to make significant improvements in function in a reasonable and predictable amount of time.     Precautions / Restrictions Precautions Precautions: Fall Recall of Precautions/Restrictions: Intact Restrictions Weight Bearing  Restrictions Per Provider Order: No      Mobility  Bed Mobility Overal bed mobility: Needs Assistance Bed Mobility: Sit to Supine       Sit to supine: Supervision        Transfers Overall transfer level: Needs assistance Equipment used: 1 person hand held assist Transfers: Sit to/from Stand, Bed to chair/wheelchair/BSC Sit to Stand: Min assist   Step pivot transfers: Min assist       General transfer comment: minA for steadying due to hurrying to [facility], and minA to stand back up due to lack of UE support    Ambulation/Gait               General Gait Details: deferred due to pt wheezing/SOB  Stairs            Wheelchair Mobility     Tilt Bed    Modified Rankin (Stroke Patients Only)       Balance Overall balance assessment: Needs assistance Sitting-balance support: Feet supported Sitting balance-Leahy Scale: Good Sitting balance - Comments: pericare in sitting without assistance   Standing balance support: Bilateral upper extremity supported Standing balance-Leahy Scale: Poor Standing balance comment: pt safer with BUE support                             Pertinent Vitals/Pain Pain Assessment Pain Assessment: No/denies pain    Home Living Family/patient expects to be discharged to:: Private residence Living Arrangements: Alone Available Help at Discharge: Personal care attendant;Family (granddaughter goes on wednesdays, both sons drop in, caregiver [place], TF 630-12) Type of Home: House Home Access: Level entry  Home Layout: One level Home Equipment: None      Prior Function Prior Level of Function : Independent/Modified Independent             Mobility Comments: pt and family reported pt does not use AD, furniture walks at home ADLs Comments: pt reports aide assist with setup for clothes, med management. has meals on wheels daily     Extremity/Trunk Assessment   Upper Extremity Assessment Upper Extremity  Assessment: Generalized weakness    Lower Extremity Assessment Lower Extremity Assessment: Generalized weakness       Communication        Cognition Arousal: Alert Behavior During Therapy: WFL for tasks assessed/performed   PT - Cognitive impairments: History of cognitive impairments                         Following commands: Intact       Cueing       General Comments      Exercises     Assessment/Plan    PT Assessment Patient needs continued PT services  PT Problem List Decreased strength;Decreased activity tolerance;Decreased balance;Decreased mobility;Decreased knowledge of use of DME       PT Treatment Interventions DME instruction;Balance training;Gait training;Neuromuscular re-education;Stair training;Functional mobility training;Patient/family education;Therapeutic activities;Therapeutic exercise    PT Goals (Current goals can be found in the Care Plan section)  Acute Rehab PT Goals Patient Stated Goal: to go home PT Goal Formulation: With patient Time For Goal Achievement: 08/16/23 Potential to Achieve Goals: Good    Frequency Min 1X/week     Co-evaluation               AM-PAC PT "6 Clicks" Mobility  Outcome Measure Help needed turning from your back to your side while in a flat bed without using bedrails?: A Little Help needed moving from lying on your back to sitting on the side of a flat bed without using bedrails?: A Little Help needed moving to and from a bed to a chair (including a wheelchair)?: A Little Help needed standing up from a chair using your arms (e.g., wheelchair or bedside chair)?: A Little Help needed to walk in hospital room?: A Little Help needed climbing 3-5 steps with a railing? : A Lot 6 Click Score: 17    End of Session   Activity Tolerance: Patient tolerated treatment well Patient left: in bed;with bed alarm set;with call bell/phone within reach;with family/visitor present Nurse Communication:  Mobility status PT Visit Diagnosis: Other abnormalities of gait and mobility (R26.89);Muscle weakness (generalized) (M62.81)    Time: 1610-9604 PT Time Calculation (min) (ACUTE ONLY): 15 min   Charges:   PT Evaluation $PT Eval Low Complexity: 1 Low PT Treatments $Therapeutic Activity: 8-22 mins PT General Charges $$ ACUTE PT VISIT: 1 Visit       Olga Coaster PT, DPT 2:49 PM,08/02/23

## 2023-08-02 NOTE — Sepsis Progress Note (Signed)
 Elink will follow per sepsis protocol.

## 2023-08-02 NOTE — ED Triage Notes (Signed)
 Pt to ED via [facility] from home for c/o fall and SOB. Pt fell between dresser and bed. Pt O2 87% on ra on EMS arrival, placed on 12L. Given albuterol and DuoNeb.  Temp 99 HR 100 BP 200/100

## 2023-08-02 NOTE — Consult Note (Signed)
 CODE SEPSIS - PHARMACY COMMUNICATION  **Broad Spectrum Antibiotics should be administered within 1 hour of Sepsis diagnosis**  Time Code Sepsis Called/Page Received: 0815  Antibiotics Ordered: ceftriaxone and azithromycin  Time of 1st antibiotic administration: 0850  Additional action taken by pharmacy: N/A  Barrie Folk ,PharmD Clinical Pharmacist  08/02/2023  8:16 AM

## 2023-08-02 NOTE — H&P (Signed)
 History and Physical    [name] XLK:[id number] DOB: 1933-10-26 DOA: 08/02/2023  PCP: Sherlyn Hay, DO (Confirm with patient/family/NH records and if not entered, this has to be entered at [facility] point of entry) Patient coming from: Home  I have personally briefly reviewed patient's old medical records in [facility]  Chief Complaint: Cough, wheezing, shortness of breath and fell  HPI: Jordan Gordon is a 89 y.o. female with medical history significant of advanced dementia, CAD, HTN, HLD, anxiety/depression, brought in by family member for increasing shortness of breath cough and fall.  Patient started to have wheezing and dry cough yesterday, and " very shaky" did not ambulate, and this morning, patient was found by caregiver on the floor.  Patient could not remember much what has happened.  Denied any headache chest pain, no diarrhea, denied any fever or chills.  Patient had flu vaccination this season.  EMS found patient hypoxic O2 saturation 87% on room air and placed on 2 L.  Blood pressure elevated 200/100. ED Course: Temperature 98.3, tachycardia, tachypneic blood pressure elevated 160/70, O2 saturation 97% on 2 L.  Chest x-ray negative for acute infiltrates.  Blood work showed WBC 5.2, K2.1, normal kidney function.  Respiratory panel positive for influenza A.  Patient was given Tamiflu in the ED.  Review of Systems: As per HPI otherwise 14 point review of systems negative.   Past Medical History:  Diagnosis Date   Anxiety    Coronary artery disease    GERD (gastroesophageal reflux disease)    HOH (hard of hearing)    Hyperlipidemia    Hypertension    Major depressive disorder     Past Surgical History:  Procedure Laterality Date   ABDOMINAL HYSTERECTOMY  1979   BREAST EXCISIONAL BIOPSY Left 2005   benign   CATARACT EXTRACTION W/PHACO Left 02/08/2018   Procedure: CATARACT EXTRACTION PHACO AND INTRAOCULAR LENS PLACEMENT (IOC);  Surgeon: Galen Manila, MD;  Location:  [facility] ORS;  Service: Ophthalmology;  Laterality: Left;  [place] 00:33.4 AP% 13.9 CDE 4.66 Fluid Pack lot # [id number]   COLONOSCOPY WITH PROPOFOL N/A 07/22/2015   Procedure: COLONOSCOPY WITH PROPOFOL;  Surgeon: Scot Jun, MD;  Location: [facility] ENDOSCOPY;  Service: Endoscopy;  Laterality: N/A;   COLONOSCOPY WITH PROPOFOL N/A 10/19/2016   Procedure: COLONOSCOPY WITH PROPOFOL;  Surgeon: Scot Jun, MD;  Location: [facility] ENDOSCOPY;  Service: Endoscopy;  Laterality: N/A;   COLONOSCOPY WITH PROPOFOL N/A 09/18/2020   Procedure: COLONOSCOPY WITH PROPOFOL;  Surgeon: Earline Mayotte, MD;  Location: [facility] ENDOSCOPY;  Service: Endoscopy;  Laterality: N/A;   ESOPHAGOGASTRODUODENOSCOPY (EGD) WITH PROPOFOL N/A 09/18/2020   Procedure: ESOPHAGOGASTRODUODENOSCOPY (EGD) WITH PROPOFOL;  Surgeon: Earline Mayotte, MD;  Location: [facility] ENDOSCOPY;  Service: Endoscopy;  Laterality: N/A;   JOINT REPLACEMENT     TKR   KNEE SURGERY Left 2011   2007   TONSILLECTOMY  1940   ADENOIDS     reports that she has never smoked. She has never used smokeless tobacco. She reports current alcohol use. She reports that she does not use drugs.  Allergies  Allergen Reactions   Codeine     nausea    Family History  Problem Relation Age of Onset   Cancer Mother    Heart disease Father    Cancer Brother    Breast cancer Neg Hx     Prior to Admission medications   Medication Sig Start Date End Date Taking? Authorizing Provider  ALPRAZolam Prudy Feeler) 0.25  MG tablet Take 1 tablet (0.25 mg total) by mouth 3 (three) times daily as needed for anxiety. 07/07/23   Sherlyn Hay, DO  amLODipine (NORVASC) 10 MG tablet Take 1 tablet (10 mg total) by mouth daily. 07/07/23   Sherlyn Hay, DO  aspirin EC 81 MG tablet Take 81 mg by mouth daily. Swallow whole.    [provider]  donepezil (ARICEPT) 10 MG tablet Take 1 tablet (10 mg total) by mouth at bedtime. 07/07/23 07/01/24  Sherlyn Hay, DO  losartan (COZAAR) 100 MG  tablet Take 1 tablet (100 mg total) by mouth daily. 07/07/23   Sherlyn Hay, DO  oxybutynin (DITROPAN-XL) 5 MG 24 hr tablet Take 1 tablet (5 mg total) by mouth at bedtime. 07/07/23   Sherlyn Hay, DO  sertraline (ZOLOFT) 100 MG tablet Take 1 tablet (100 mg total) by mouth daily. 07/07/23   Sherlyn Hay, DO  traZODone (DESYREL) 150 MG tablet Take 1 tablet (150 mg total) by mouth at bedtime. 07/07/23   Sherlyn Hay, DO    Physical Exam: Vitals:   08/02/23 0730 08/02/23 0800 08/02/23 0830 08/02/23 0900  BP: (!) 178/152 (!) 160/64  (!) 156/68  Pulse: (!) 108 (!) 102 99 100  Resp: (!) 31 (!) 33 (!) 22 (!) 25  Temp:      TempSrc:      SpO2: 96% 94% 97% 99%    Constitutional: NAD, calm, comfortable Vitals:   08/02/23 0730 08/02/23 0800 08/02/23 0830 08/02/23 0900  BP: (!) 178/152 (!) 160/64  (!) 156/68  Pulse: (!) 108 (!) 102 99 100  Resp: (!) 31 (!) 33 (!) 22 (!) 25  Temp:      TempSrc:      SpO2: 96% 94% 97% 99%   Eyes: PERRL, lids and conjunctivae normal ENMT: Mucous membranes are moist. Posterior pharynx clear of any exudate or lesions.Normal dentition.  Neck: normal, supple, no masses, no thyromegaly Respiratory: Diminished breathing sound bilateral, diffused wheezing, no crackles, increasing breathing effort,. No accessory muscle use.  Cardiovascular: Regular rate and rhythm, no murmurs / rubs / gallops. No extremity edema. 2+ pedal pulses. No carotid bruits.  Abdomen: no tenderness, no masses palpated. No hepatosplenomegaly. Bowel sounds positive.  Musculoskeletal: no clubbing / cyanosis. No joint deformity upper and lower extremities. Good ROM, no contractures. Normal muscle tone.  Skin: no rashes, lesions, ulcers. No induration Neurologic: CN 2-12 grossly intact. Sensation intact, DTR normal. Strength 5/5 in all 4.  Psychiatric: Normal judgment and insight. Alert and oriented x 3. Normal mood.     Labs on Admission: I have personally reviewed following labs and  imaging studies  CBC: Recent Labs  Lab 08/02/23 0733  WBC 5.2  NEUTROABS 3.8  HGB 12.9  HCT 37.1  MCV 84.1  PLT 138*   Basic Metabolic Panel: Recent Labs  Lab 08/02/23 0733  NA 139  K 3.1*  CL 105  CO2 23  GLUCOSE 152*  BUN 12  CREATININE 0.92  CALCIUM 8.6*   GFR: CrCl cannot be calculated (Unknown ideal weight.). Liver Function Tests: Recent Labs  Lab 08/02/23 0733  AST 47*  ALT 26  ALKPHOS 80  BILITOT 0.8  PROT 7.1  ALBUMIN 3.8   No results for input(s): "LIPASE", "AMYLASE" in the last 168 hours. No results for input(s): "AMMONIA" in the last 168 hours. Coagulation Profile: Recent Labs  Lab 08/02/23 0733  INR 1.1   Cardiac Enzymes: Recent Labs  Lab 08/02/23 [phone]  CKTOTAL 987*   BNP (last 3 results) No results for input(s): "PROBNP" in the last 8760 hours. HbA1C: No results for input(s): "HGBA1C" in the last 72 hours. CBG: No results for input(s): "GLUCAP" in the last 168 hours. Lipid Profile: No results for input(s): "CHOL", "HDL", "LDLCALC", "TRIG", "CHOLHDL", "LDLDIRECT" in the last 72 hours. Thyroid Function Tests: No results for input(s): "TSH", "T4TOTAL", "FREET4", "T3FREE", "THYROIDAB" in the last 72 hours. Anemia Panel: No results for input(s): "VITAMINB12", "FOLATE", "FERRITIN", "TIBC", "IRON", "RETICCTPCT" in the last 72 hours. Urine analysis:    Component Value Date/Time   COLORURINE YELLOW (A) 08/02/2023 0851   APPEARANCEUR CLEAR (A) 08/02/2023 0851   APPEARANCEUR Hazy 01/12/2013 0816   LABSPEC 1.013 08/02/2023 0851   LABSPEC 1.016 01/12/2013 0816   PHURINE 6.0 08/02/2023 0851   GLUCOSEU NEGATIVE 08/02/2023 0851   GLUCOSEU Negative 01/12/2013 0816   HGBUR MODERATE (A) 08/02/2023 0851   BILIRUBINUR NEGATIVE 08/02/2023 0851   BILIRUBINUR negative 08/27/2020 1054   BILIRUBINUR Negative 01/12/2013 0816   KETONESUR NEGATIVE 08/02/2023 0851   PROTEINUR 30 (A) 08/02/2023 0851   UROBILINOGEN 0.2 08/27/2020 1054   NITRITE NEGATIVE  08/02/2023 0851   LEUKOCYTESUR NEGATIVE 08/02/2023 0851   LEUKOCYTESUR 1+ 01/12/2013 0816    Radiological Exams on Admission: CT Head Wo Contrast Result Date: 08/02/2023 CLINICAL DATA:  Provided history: Mental status change, unknown cause. Additional history provided: Fall. EXAM: CT HEAD WITHOUT CONTRAST TECHNIQUE: Contiguous axial images were obtained from the base of the skull through the vertex without intravenous contrast. RADIATION DOSE REDUCTION: This exam was performed according to the departmental dose-optimization program which includes automated exposure control, adjustment of the mA and/or kV according to patient size and/or use of iterative reconstruction technique. COMPARISON:  Brain MRI 02/17/2021. Report from head CT 09/24/1995 (images unavailable). FINDINGS: Brain: Generalized cerebral atrophy. Patchy and ill-defined hypoattenuation within the cerebral white matter, nonspecific but compatible with mild chronic small vessel ischemic disease. There is no acute intracranial hemorrhage. No demarcated cortical infarct. No extra-axial fluid collection. No evidence of an intracranial mass. No midline shift. Vascular: No hyperdense vessel.  Atherosclerotic calcifications. Skull: No calvarial fracture or aggressive osseous lesion. Sinuses/Orbits: No mass or acute finding within the imaged orbits. Prior right ocular lens replacement. Minimal mucosal thickening within the bilateral ethmoid and right sphenoid sinuses. IMPRESSION: 1. No evidence of an acute intracranial abnormality. 2. Mild chronic small vessel ischemic changes within the cerebral white matter. 3. Cerebral atrophy. 4. Minor paranasal sinus mucosal thickening at the imaged levels. Electronically Signed   By: Jackey Loge D.O.   On: 08/02/2023 08:27   DG Chest Port 1 View Result Date: 08/02/2023 CLINICAL DATA:  Sepsis.  Evaluate for abnormality. EXAM: PORTABLE CHEST 1 VIEW COMPARISON:  11/11/2020 FINDINGS: Heart size and mediastinal  contours appear normal. Aortic atherosclerosis. Lungs are hypoinflated. Similar, mild bibasilar scarring. No pleural fluid, interstitial edema or airspace disease. Visualized osseous structures appear intact. IMPRESSION: Low lung volumes. No acute findings. Electronically Signed   By: Signa Kell M.D.   On: 08/02/2023 08:07    EKG: Independently reviewed. Sinus tachycardia, no acute ST changes  Assessment/Plan Principal Problem:   Influenza Active Problems:   Influenza A with pneumonia  (please populate well all problems here in Problem List. (For example, if patient is on BP meds at home and you resume or decide to hold them, it is a problem that needs to be her. Same for CAD, COPD, HLD and so on)  Acute hypoxic respiratory failure -  Secondary to influenza A pneumonia and acute bronchitis -Continue Tamiflu -1 dose of IV Solu-Medrol and IV magnesium -DuoNebs and as needed Atrovent -Incentive spirometry and flutter valve -Other DDx, patient appears to be euvolemic and chest x-ray showed no acute infiltrates, low suspicion for CHF.  Will check one-time troponin level.  Influenza A pneumonia -As above  Sepsis, without acute endorgan damage -Sepsis labs advanced tachycardia and tachypneic, and hypoxic, source of infection is influenza A pneumonia management as above.  Fall -Denied any muscle or joint pain, denied any LOC -CT head negative for acute findings -PT evaluation  HTN -BP elevated, resume home BP meds  Advanced dementia -Mentation at baseline, continue Aricept  DVT prophylaxis: Lovenox Code Status: DNR Family Communication: Son at bedside Disposition Plan: Expect less than 2 midnight hospital stay Consults called: None Admission status: Telemetry observation   Emeline General MD [facility] Pager [phone]  08/02/2023, 10:15 AM

## 2023-08-02 NOTE — Evaluation (Signed)
 Occupational Therapy Evaluation Patient Details Name: Jordan Gordon MRN: [id number] DOB: 1934-02-01 Today's Date: 08/02/2023   History of Present Illness   Pt is an 89 y.o. female with medical history significant of advanced dementia, CAD, HTN, HLD, anxiety/depression, brought in by family member for increasing shortness of breath cough and fall, workup for fluA.     Clinical Impressions Pt in bed upon OT arrival with son Thayer Ohm present.  Both agreeable to OT evaluation.  Pt presents with baseline cognitive impairment d/t hx of dementia; son helps to provide accurate portrayal of PLOF.  Pt does have private aid assist at baseline 6 hours daily, but is alone at night time.  Son reports they found pt on the floor this morning.  Pt presents with decreased activity tolerance and dyspnea, requiring 2L 02 to maintain sats in low 90s at rest and with activity.  See below for ADL details.  AE needs discussed with pt and son.  Son reports that family and caregivers would like for pt to increase frequency with her showers, but that she needs a shower chair.  OT advised on benefits of 3in1; this would be recommended to maximize indep and safety with bathing and toileting in the home.  Son in agreement with recommendation for 3in1.  Pt will continue to benefit from OT in the acute setting to maximize performance with ADLs and activity tolerance.      If plan is discharge home, recommend the following:   A little help with walking and/or transfers;Assistance with cooking/housework;Direct supervision/assist for medications management;Assist for transportation;Supervision due to cognitive status;A little help with bathing/dressing/bathroom;Help with stairs or ramp for entrance     Functional Status Assessment   Patient has had a recent decline in their functional status and demonstrates the ability to make significant improvements in function in a reasonable and predictable amount of time.     Equipment  Recommendations   BSC/3in1;Other (comment) (RW)     Recommendations for Other Services         Precautions/Restrictions   Precautions Precautions: Fall Recall of Precautions/Restrictions: Intact Restrictions Weight Bearing Restrictions Per Provider Order: No     Mobility Bed Mobility Overal bed mobility: Needs Assistance Bed Mobility: Sit to Supine, Supine to Sit     Supine to sit: Min assist Sit to supine: Supervision, HOB elevated     Patient Response: Cooperative  Transfers Overall transfer level: Needs assistance Equipment used: 1 person hand held assist Transfers: Sit to/from Stand, Bed to chair/wheelchair/BSC Sit to Stand: Contact guard assist     Step pivot transfers: Contact guard assist            Balance Overall balance assessment: Needs assistance Sitting-balance support: Feet supported Sitting balance-Leahy Scale: Good Sitting balance - Comments: able to reach feet while seated EOB and return to upright sitting   Standing balance support: Bilateral upper extremity supported Standing balance-Leahy Scale: Poor Standing balance comment: Pt holds to surrounding furniture/countertops with mobility at bedside                           ADL either performed or assessed with clinical judgement   ADL Overall ADL's : Needs assistance/impaired     Grooming: Wash/dry hands;Supervision/safety;Standing               Lower Body Dressing: Minimal assistance;Sitting/lateral leans   Toilet Transfer: Contact guard assist;BSC/3in1   Toileting- Clothing Manipulation and Hygiene: Supervision/safety;Contact guard assist;Sitting/lateral lean Toileting -  Clothing Manipulation Details (indicate cue type and reason): Pt with some urgency to transfer to 3in1; mild urinary incontinence/dribbling during transfer     Functional mobility during ADLs: Contact guard assist General ADL Comments: furniture walking at bedside to reach [facility]     Vision  Baseline Vision/History: 6 Macular Degeneration;3 Glaucoma Ability to See in Adequate Light: 3 Highly impaired       Perception         Praxis         Pertinent Vitals/Pain Pain Assessment Pain Assessment: No/denies pain     Extremity/Trunk Assessment Upper Extremity Assessment Upper Extremity Assessment: Generalized weakness   Lower Extremity Assessment Lower Extremity Assessment: Generalized weakness       Communication Communication Communication: No apparent difficulties Factors Affecting Communication: Hearing impaired   Cognition Arousal: Alert Behavior During Therapy: WFL for tasks assessed/performed Cognition: History of cognitive impairments             OT - Cognition Comments: hx of dementia                 Following commands: Intact       Cueing  General Comments   Cueing Techniques: Verbal cues      Exercises Other Exercises Other Exercises: Educ on OT role, goals, poc   Shoulder Instructions      Home Living Family/patient expects to be discharged to:: Private residence Living Arrangements: Alone Available Help at Discharge: Personal care attendant;Family;Available PRN/intermittently (lives lives across the street and pt has a private caregiver 6 hours every day) Type of Home: House Home Access: Level entry     Home Layout: One level     Bathroom Shower/Tub: Producer, television/film/video: Standard     Home Equipment: None          Prior Functioning/Environment Prior Level of Function : Independent/Modified Independent             Mobility Comments: pt and family reported pt does not use AD, furniture walks at home; son reports he thinks she needs a walker ADLs Comments: Caregiver assist 6 hours every day.  Aid assists with making lunch, helping pt to the bathroom, laundry.  Assist needed for bathing.  Pt has meal on wheels.    OT Problem List: Decreased activity tolerance;Decreased strength;Decreased  cognition;Impaired balance (sitting and/or standing);Decreased safety awareness;Decreased knowledge of precautions   OT Treatment/Interventions: Self-care/ADL training;Energy conservation;Balance training;Therapeutic exercise;DME and/or AE instruction;Therapeutic activities;Patient/family education;Neuromuscular education      OT Goals(Current goals can be found in the care plan section)   Acute Rehab OT Goals Patient Stated Goal: Son hopeful for d/c to rehab OT Goal Formulation: With patient/family Time For Goal Achievement: 08/16/23 Potential to Achieve Goals: Good ADL Goals Pt Will Perform Lower Body Dressing: with supervision;sit to/from stand Pt Will Transfer to Toilet: with supervision;ambulating;grab bars Pt Will Perform Toileting - Clothing Manipulation and hygiene: with supervision;sit to/from stand   OT Frequency:  Min 1X/week    Co-evaluation              AM-PAC OT "6 Clicks" Daily Activity     Outcome Measure Help from another person eating meals?: None Help from another person taking care of personal grooming?: A Little Help from another person toileting, which includes using toliet, bedpan, or urinal?: A Little Help from another person bathing (including washing, rinsing, drying)?: A Lot Help from another person to put on and taking off regular upper body clothing?: A Little Help from  another person to put on and taking off regular lower body clothing?: A Little 6 Click Score: 18   End of Session Equipment Utilized During Treatment: Oxygen  Activity Tolerance: Patient tolerated treatment well Patient left: in bed;with call bell/phone within reach;with bed alarm set;with family/visitor present  OT Visit Diagnosis: Unsteadiness on feet (R26.81);Muscle weakness (generalized) (M62.81);History of falling (Z91.81)                Time: 1453-1520 OT Time Calculation (min): 27 min Charges:  OT General Charges $OT Visit: 1 Visit OT Evaluation $OT Eval Moderate  Complexity: 1 Mod OT Treatments $Self Care/Home Management : 8-22 mins  Danelle Earthly, MS, OTR/L   Otis Dials 08/02/2023, 4:18 PM

## 2023-08-03 DIAGNOSIS — Z789 Other specified health status: Secondary | ICD-10-CM | POA: Diagnosis not present

## 2023-08-03 DIAGNOSIS — J9601 Acute respiratory failure with hypoxia: Secondary | ICD-10-CM | POA: Diagnosis present

## 2023-08-03 DIAGNOSIS — J209 Acute bronchitis, unspecified: Secondary | ICD-10-CM | POA: Diagnosis present

## 2023-08-03 DIAGNOSIS — Z66 Do not resuscitate: Secondary | ICD-10-CM | POA: Diagnosis present

## 2023-08-03 DIAGNOSIS — Z79899 Other long term (current) drug therapy: Secondary | ICD-10-CM | POA: Diagnosis not present

## 2023-08-03 DIAGNOSIS — J111 Influenza due to unidentified influenza virus with other respiratory manifestations: Secondary | ICD-10-CM | POA: Diagnosis not present

## 2023-08-03 DIAGNOSIS — I10 Essential (primary) hypertension: Secondary | ICD-10-CM | POA: Diagnosis present

## 2023-08-03 DIAGNOSIS — Z90710 Acquired absence of both cervix and uterus: Secondary | ICD-10-CM | POA: Diagnosis not present

## 2023-08-03 DIAGNOSIS — E538 Deficiency of other specified B group vitamins: Secondary | ICD-10-CM | POA: Diagnosis present

## 2023-08-03 DIAGNOSIS — I2510 Atherosclerotic heart disease of native coronary artery without angina pectoris: Secondary | ICD-10-CM | POA: Diagnosis present

## 2023-08-03 DIAGNOSIS — J101 Influenza due to other identified influenza virus with other respiratory manifestations: Secondary | ICD-10-CM | POA: Diagnosis present

## 2023-08-03 DIAGNOSIS — A4189 Other specified sepsis: Secondary | ICD-10-CM | POA: Diagnosis present

## 2023-08-03 DIAGNOSIS — F0390 Unspecified dementia without behavioral disturbance: Secondary | ICD-10-CM | POA: Diagnosis present

## 2023-08-03 DIAGNOSIS — E785 Hyperlipidemia, unspecified: Secondary | ICD-10-CM | POA: Diagnosis present

## 2023-08-03 DIAGNOSIS — Z8249 Family history of ischemic heart disease and other diseases of the circulatory system: Secondary | ICD-10-CM | POA: Diagnosis not present

## 2023-08-03 DIAGNOSIS — R531 Weakness: Secondary | ICD-10-CM | POA: Diagnosis not present

## 2023-08-03 DIAGNOSIS — F419 Anxiety disorder, unspecified: Secondary | ICD-10-CM | POA: Diagnosis present

## 2023-08-03 DIAGNOSIS — J440 Chronic obstructive pulmonary disease with acute lower respiratory infection: Secondary | ICD-10-CM | POA: Diagnosis present

## 2023-08-03 DIAGNOSIS — Z7982 Long term (current) use of aspirin: Secondary | ICD-10-CM | POA: Diagnosis not present

## 2023-08-03 DIAGNOSIS — E876 Hypokalemia: Secondary | ICD-10-CM | POA: Diagnosis present

## 2023-08-03 DIAGNOSIS — F329 Major depressive disorder, single episode, unspecified: Secondary | ICD-10-CM | POA: Diagnosis present

## 2023-08-03 DIAGNOSIS — K219 Gastro-esophageal reflux disease without esophagitis: Secondary | ICD-10-CM | POA: Diagnosis present

## 2023-08-03 LAB — BASIC METABOLIC PANEL
Anion gap: 6 (ref 5–15)
BUN: 15 mg/dL (ref 8–23)
CO2: 25 mmol/L (ref 22–32)
Calcium: 8.5 mg/dL — ABNORMAL LOW (ref 8.9–10.3)
Chloride: 109 mmol/L (ref 98–111)
Creatinine, Ser: 1.09 mg/dL — ABNORMAL HIGH (ref 0.44–1.00)
GFR, Estimated: 49 mL/min — ABNORMAL LOW (ref >60)
Glucose, Bld: 109 mg/dL — ABNORMAL HIGH (ref 70–99)
Potassium: 3.5 mmol/L (ref 3.5–5.1)
Sodium: 140 mmol/L (ref 135–145)

## 2023-08-03 LAB — CK: Total CK: 681 U/L — ABNORMAL HIGH (ref 38–234)

## 2023-08-03 MED ORDER — FUROSEMIDE 10 MG/ML IJ SOLN
40.0000 mg | Freq: Once | INTRAMUSCULAR | Status: AC
  Administered 2023-08-03: 40 mg via INTRAVENOUS
  Filled 2023-08-03: qty 4

## 2023-08-03 MED ORDER — ARFORMOTEROL TARTRATE 15 MCG/2ML IN NEBU
15.0000 ug | INHALATION_SOLUTION | Freq: Two times a day (BID) | RESPIRATORY_TRACT | Status: DC
Start: 2023-08-03 — End: 2023-08-09
  Administered 2023-08-03 – 2023-08-09 (×11): 15 ug via RESPIRATORY_TRACT
  Filled 2023-08-03 (×14): qty 2

## 2023-08-03 MED ORDER — METHYLPREDNISOLONE SODIUM SUCC 125 MG IJ SOLR
60.0000 mg | Freq: Every day | INTRAMUSCULAR | Status: DC
  Administered 2023-08-03 – 2023-08-04 (×2): 60 mg via INTRAVENOUS
  Filled 2023-08-03 (×3): qty 2

## 2023-08-03 NOTE — Progress Notes (Signed)
 PROGRESS NOTE    Jordan Gordon  NWG:[id number] DOB: 1933-08-02 DOA: 08/02/2023 PCP: Sherlyn Hay, DO    Brief Narrative:   89 y.o. female with medical history significant of advanced dementia, CAD, HTN, HLD, anxiety/depression, brought in by family member for increasing shortness of breath cough and fall.   Patient started to have wheezing and dry cough yesterday, and " very shaky" did not ambulate, and this morning, patient was found by caregiver on the floor.  Patient could not remember much what has happened.  Denied any headache chest pain, no diarrhea, denied any fever or chills.  Patient had flu vaccination this season.  EMS found patient hypoxic O2 saturation 87% on room air and placed on 2 L.  Blood pressure elevated 200/100.  ED Course: Temperature 98.3, tachycardia, tachypneic blood pressure elevated 160/70, O2 saturation 97% on 2 L.  Chest x-ray negative for acute infiltrates.  Blood work showed WBC 5.2, K2.1, normal kidney function.  Respiratory panel positive for influenza A.  Assessment & Plan:   Principal Problem:   Influenza Active Problems:   Influenza A with pneumonia  Acute hypoxic respiratory failure secondary to influenza A No evidence of pneumonia on x-ray.  No consolidation Patient continues to be mildly hypoxic requiring 2 L nasal cannula Plan: Continue p.o. Tamiflu IV Solu-Medrol today, consider transition to p.o. prednisone starting 2/26 Multimodal bronchodilators Incentive spirometry Avoid IV fluids Lasix 40 mg IV x 1 dose Respiratory isolation  Sepsis, without acute endorgan damage secondary to influenza A -Sepsis labs advanced tachycardia and tachypneic, and hypoxic, source of infection is influenza  Management as above   Fall No residual pain.  No evidence of loss of consciousness.  CT imaging survey reassuring. Therapy evaluations    HTN Resume home regimen   Advanced dementia Mentation at baseline.  Resume Aricept   DVT prophylaxis: SQ  Lovenox Code Status: DNR Family Communication: Son at bedside 2/25 Disposition Plan: Status is: Inpatient Remains inpatient appropriate because: Influenza A with weakness and hypoxia   Level of care: Med-Surg  Consultants:  None  Procedures:  None  Antimicrobials: Tamiflu    Subjective: Seen and examined.  Resting in bed.  Continues to endorse shortness of breath.  Son at bedside.  No pain complaints.  Objective: Vitals:   08/03/23 0259 08/03/23 0733 08/03/23 0802 08/03/23 1058  BP:   (!) 166/77 (!) 151/76  Pulse:   64 100  Resp:   (!) 22 18  Temp:   98.1 F (36.7 C) 98.9 F (37.2 C)  TempSrc:   Oral Oral  SpO2: 90% 91% 92% 94%  Weight:      Height:        Intake/Output Summary (Last 24 hours) at 08/03/2023 1306 Last data filed at 08/03/2023 1035 Gross per 24 hour  Intake 1110.16 ml  Output --  Net 1110.16 ml   Filed Weights   08/02/23 1200  Weight: 71.4 kg    Examination:  General exam: NAD Respiratory system: Scattered coarse crackles bilaterally.  End expiratory wheeze.  Normal work of breathing.  2 L Cardiovascular system: S1-S2, RRR, no murmurs, no pedal edema Gastrointestinal system: Soft, NT/ND, normal bowel sounds Central nervous system: Alert and oriented x 2. No focal neurological deficits. Extremities: Symmetric 5 x 5 power. Skin: No rashes, lesions or ulcers Psychiatry: Judgement and insight appear normal. Mood & affect appropriate.     Data Reviewed: I have personally reviewed following labs and imaging studies  CBC: Recent Labs  Lab  08/02/23 0733  WBC 5.2  NEUTROABS 3.8  HGB 12.9  HCT 37.1  MCV 84.1  PLT 138*   Basic Metabolic Panel: Recent Labs  Lab 08/02/23 0733 08/03/23 0258  NA 139 140  K 3.1* 3.5  CL 105 109  CO2 23 25  GLUCOSE 152* 109*  BUN 12 15  CREATININE 0.92 1.09*  CALCIUM 8.6* 8.5*   GFR: Estimated Creatinine Clearance: 34.4 mL/min (A) (by C-G formula based on SCr of 1.09 mg/dL (H)). Liver Function  Tests: Recent Labs  Lab 08/02/23 0733  AST 47*  ALT 26  ALKPHOS 80  BILITOT 0.8  PROT 7.1  ALBUMIN 3.8   No results for input(s): "LIPASE", "AMYLASE" in the last 168 hours. No results for input(s): "AMMONIA" in the last 168 hours. Coagulation Profile: Recent Labs  Lab 08/02/23 0733  INR 1.1   Cardiac Enzymes: Recent Labs  Lab 08/02/23 0733 08/03/23 0258  CKTOTAL 987* 681*   BNP (last 3 results) No results for input(s): "PROBNP" in the last 8760 hours. HbA1C: No results for input(s): "HGBA1C" in the last 72 hours. CBG: No results for input(s): "GLUCAP" in the last 168 hours. Lipid Profile: No results for input(s): "CHOL", "HDL", "LDLCALC", "TRIG", "CHOLHDL", "LDLDIRECT" in the last 72 hours. Thyroid Function Tests: Recent Labs    08/02/23 0733  TSH 2.147   Anemia Panel: No results for input(s): "VITAMINB12", "FOLATE", "FERRITIN", "TIBC", "IRON", "RETICCTPCT" in the last 72 hours. Sepsis Labs: Recent Labs  Lab 08/02/23 4098 08/02/23 0954  LATICACIDVEN 1.9 2.3*    Recent Results (from the past 240 hours)  Blood Culture (routine x 2)     Status: None (Preliminary result)   Collection Time: 08/02/23  7:33 AM   Specimen: BLOOD  Result Value Ref Range Status   Specimen Description BLOOD LEFT AC  Final   Special Requests   Final    BOTTLES DRAWN AEROBIC AND ANAEROBIC Blood Culture results Lyn not be optimal due to an inadequate volume of blood received in culture bottles   Culture   Final    NO GROWTH 1 DAY Performed at [facility], [address], [place], [place] [address]    Report Status PENDING  Incomplete  Resp panel by RT-PCR (RSV, Flu A&B, Covid) Anterior Nasal Swab     Status: Abnormal   Collection Time: 08/02/23  7:41 AM   Specimen: Anterior Nasal Swab  Result Value Ref Range Status   SARS Coronavirus 2 by RT PCR NEGATIVE NEGATIVE Final    Comment: (NOTE) SARS-CoV-2 target nucleic acids are NOT DETECTED.  The SARS-CoV-2 RNA is  generally detectable in upper respiratory specimens during the acute phase of infection. The lowest concentration of SARS-CoV-2 viral copies this assay can detect is 138 copies/mL. A negative result does not preclude SARS-Cov-2 infection and should not be used as the sole basis for treatment or other patient management decisions. A negative result Marin occur with  improper specimen collection/handling, submission of specimen other than nasopharyngeal swab, presence of viral mutation(s) within the areas targeted by this assay, and inadequate number of viral copies(<138 copies/mL). A negative result must be combined with clinical observations, patient history, and epidemiological information. The expected result is Negative.  Fact Sheet for Patients:  BloggerCourse.com  Fact Sheet for Healthcare Providers:  SeriousBroker.it  This test is no t yet approved or cleared by the [place] FDA and  has been authorized for detection and/or diagnosis of SARS-CoV-2 by FDA under an Emergency Use Authorization (EUA).  This EUA will remain  in effect (meaning this test can be used) for the duration of the COVID-19 declaration under Section 564(b)(1) of the Act, 21 U.S.C.section 360bbb-3(b)(1), unless the authorization is terminated  or revoked sooner.       Influenza A by PCR POSITIVE (A) NEGATIVE Final   Influenza B by PCR NEGATIVE NEGATIVE Final    Comment: (NOTE) The Xpert Xpress SARS-CoV-2/FLU/RSV plus assay is intended as an aid in the diagnosis of influenza from Nasopharyngeal swab specimens and should not be used as a sole basis for treatment. Nasal washings and aspirates are unacceptable for Xpert Xpress SARS-CoV-2/FLU/RSV testing.  Fact Sheet for Patients: BloggerCourse.com  Fact Sheet for Healthcare Providers: SeriousBroker.it  This test is not yet approved or cleared by the  [place] FDA and has been authorized for detection and/or diagnosis of SARS-CoV-2 by FDA under an Emergency Use Authorization (EUA). This EUA will remain in effect (meaning this test can be used) for the duration of the COVID-19 declaration under Section 564(b)(1) of the Act, 21 U.S.C. section 360bbb-3(b)(1), unless the authorization is terminated or revoked.     Resp Syncytial Virus by PCR NEGATIVE NEGATIVE Final    Comment: (NOTE) Fact Sheet for Patients: BloggerCourse.com  Fact Sheet for Healthcare Providers: SeriousBroker.it  This test is not yet approved or cleared by the [place] FDA and has been authorized for detection and/or diagnosis of SARS-CoV-2 by FDA under an Emergency Use Authorization (EUA). This EUA will remain in effect (meaning this test can be used) for the duration of the COVID-19 declaration under Section 564(b)(1) of the Act, 21 U.S.C. section 360bbb-3(b)(1), unless the authorization is terminated or revoked.  Performed at [facility], [address] [place], [place] [address]   Blood Culture (routine x 2)     Status: None (Preliminary result)   Collection Time: 08/02/23  8:33 AM   Specimen: BLOOD RIGHT ARM  Result Value Ref Range Status   Specimen Description BLOOD RIGHT ARM  Final   Special Requests   Final    BOTTLES DRAWN AEROBIC AND ANAEROBIC Blood Culture adequate volume   Culture   Final    NO GROWTH < 24 HOURS Performed at [facility], [address], [place], [place] [address]    Report Status PENDING  Incomplete         Radiology Studies: CT Head Wo Contrast Result Date: 08/02/2023 CLINICAL DATA:  Provided history: Mental status change, unknown cause. Additional history provided: Fall. EXAM: CT HEAD WITHOUT CONTRAST TECHNIQUE: Contiguous axial images were obtained from the base of the skull through the vertex without intravenous contrast. RADIATION DOSE  REDUCTION: This exam was performed according to the departmental dose-optimization program which includes automated exposure control, adjustment of the mA and/or kV according to patient size and/or use of iterative reconstruction technique. COMPARISON:  Brain MRI 02/17/2021. Report from head CT 09/24/1995 (images unavailable). FINDINGS: Brain: Generalized cerebral atrophy. Patchy and ill-defined hypoattenuation within the cerebral white matter, nonspecific but compatible with mild chronic small vessel ischemic disease. There is no acute intracranial hemorrhage. No demarcated cortical infarct. No extra-axial fluid collection. No evidence of an intracranial mass. No midline shift. Vascular: No hyperdense vessel.  Atherosclerotic calcifications. Skull: No calvarial fracture or aggressive osseous lesion. Sinuses/Orbits: No mass or acute finding within the imaged orbits. Prior right ocular lens replacement. Minimal mucosal thickening within the bilateral ethmoid and right sphenoid sinuses. IMPRESSION: 1. No evidence of an acute intracranial abnormality. 2. Mild chronic small vessel  ischemic changes within the cerebral white matter. 3. Cerebral atrophy. 4. Minor paranasal sinus mucosal thickening at the imaged levels. Electronically Signed   By: Jackey Loge D.O.   On: 08/02/2023 08:27   DG Chest Port 1 View Result Date: 08/02/2023 CLINICAL DATA:  Sepsis.  Evaluate for abnormality. EXAM: PORTABLE CHEST 1 VIEW COMPARISON:  11/11/2020 FINDINGS: Heart size and mediastinal contours appear normal. Aortic atherosclerosis. Lungs are hypoinflated. Similar, mild bibasilar scarring. No pleural fluid, interstitial edema or airspace disease. Visualized osseous structures appear intact. IMPRESSION: Low lung volumes. No acute findings. Electronically Signed   By: Signa Kell M.D.   On: 08/02/2023 08:07        Scheduled Meds:  amLODipine  10 mg Oral Daily   arformoterol  15 mcg Nebulization BID   aspirin EC  81 mg Oral  Daily   budesonide (PULMICORT) nebulizer solution  0.25 mg Nebulization BID   donepezil  10 mg Oral QHS   enoxaparin (LOVENOX) injection  40 mg Subcutaneous Q24H   ipratropium-albuterol  3 mL Nebulization Q6H   losartan  100 mg Oral Daily   methylPREDNISolone (SOLU-MEDROL) injection  60 mg Intravenous Daily   oseltamivir  30 mg Oral BID   oxybutynin  5 mg Oral QHS   sertraline  100 mg Oral Daily   traZODone  150 mg Oral QHS   Continuous Infusions:   LOS: 0 days     Tresa Moore, MD [facility]   If 7PM-7AM, please contact night-coverage  08/03/2023, 1:06 PM

## 2023-08-03 NOTE — Plan of Care (Signed)

## 2023-08-03 NOTE — Progress Notes (Signed)
 Occupational Therapy Treatment Patient Details Name: Jordan Gordon MRN: [id number] DOB: [birth year] Today's Date: 08/03/2023   History of present illness Pt is an 89 y.o. female with medical history significant of advanced dementia, CAD, HTN, HLD, anxiety/depression, brought in by family member for increasing shortness of breath cough and fall, workup for fluA.   OT comments  Pt seen for OT this date.  Though pt continues to require only min guard-min A for functional transfers, functional mobility and ADL efficiency is poor as noted by increased respirations at 32/min with basic ADL, including while seated, HR 120+, this with 2L 02.  Pt was able to maintain 02 sats mid-low 90s throughout session with supplemental 02.  Pt with episode of bowel incontinence while preparing for OOB activity, requiring total assist for thorough hygiene.  Pt able to participate in changing brief while seated on commode, but with increased dyspnea.  Pt observed to rush through all components of ADLs d/t fatigue/dyspnea, requiring constant cues for pacing/breathing.  Son Thayer Ohm spoke in depth with OT regarding family d/c goals with other son on phone during this discussion, both concerned with initial thought of HH therapy.  OT expressed recommendation for 24 hr supv at present with pt's limited activity tolerance with basic ADLs, decreased safety awareness, and need for ongoing cues for EC.  Brothers verbalize inability to provide that level of care, and reported that their hope with this admission was to have pt d/c to SNF and transition to LTC d/t pt's increasing level of care needed in re: to her dementia.  OT notified RN of son's request to speak with SW.  Will continue to follow in the acute setting to maximize safety and activity tolerance with ADLs and mobility.  Continue to recommend SNF upon d/c d/t cardio/pulmonary function currently limiting tolerance for safe ADL and mobility, unless 24 hr care can be achieved at home.       If plan is discharge home, recommend the following:  A little help with walking and/or transfers;Assistance with cooking/housework;Direct supervision/assist for medications management;Assist for transportation;Supervision due to cognitive status;Help with stairs or ramp for entrance;A lot of help with bathing/dressing/bathroom   Equipment Recommendations   (defer to next venue of care; if d/c home RW and 3in1 is recommended)    Recommendations for Other Services      Precautions / Restrictions Precautions Precautions: Fall Recall of Precautions/Restrictions: Intact Restrictions Weight Bearing Restrictions Per Provider Order: No Other Position/Activity Restrictions: watch 02 and respirations       Mobility Bed Mobility Overal bed mobility: Needs Assistance Bed Mobility: Supine to Sit, Sit to Supine     Supine to sit: Min assist, HOB elevated, Used rails Sit to supine: Supervision, HOB elevated     Patient Response: Cooperative  Transfers Overall transfer level: Needs assistance Equipment used: Rolling walker (2 wheels) Transfers: Sit to/from Stand Sit to Stand: Contact guard assist           General transfer comment: increase time and effort for sit to stand from bedside; cues for hand placement     Balance Overall balance assessment: Needs assistance Sitting-balance support: Feet supported Sitting balance-Leahy Scale: Good Sitting balance - Comments: able to participtae in threading legs through brief while seated on toilet   Standing balance support: Bilateral upper extremity supported Standing balance-Leahy Scale: Fair Standing balance comment: Initial posterior lean upon standing at bedside.  Able to briefly remove hands from supportive surface to help with hiking brief; wide BOS and CGA.  ADL either performed or assessed with clinical judgement   ADL Overall ADL's : Needs assistance/impaired     Grooming:  Wash/dry hands;Standing;Contact guard assist Grooming Details (indicate cue type and reason): Pt rushing d/t dyspnea; cues for pacing/breathing             Lower Body Dressing: Minimal assistance;Sit to/from stand Lower Body Dressing Details (indicate cue type and reason): brief change sitting on toilet Toilet Transfer: Contact guard assist;Rolling walker (2 wheels);Grab bars;BSC/3in1 Toilet Transfer Details (indicate cue type and reason): 3in1 placed over toilet in bathroom; cues for hand placement Toileting- Clothing Manipulation and Hygiene: Total assistance;Sit to/from stand Toileting - Clothing Manipulation Details (indicate cue type and reason): BM incontinence while preparing for OOB activity.  Total assist for thorough peri care.     Functional mobility during ADLs: Contact guard assist;Rolling walker (2 wheels) General ADL Comments: Utilized RW to amb to bathroom for toileting and brief change d/t BM incontinence.    Extremity/Trunk Assessment Upper Extremity Assessment Upper Extremity Assessment: Generalized weakness   Lower Extremity Assessment Lower Extremity Assessment: Generalized weakness        Vision Baseline Vision/History: 6 Macular Degeneration;3 Glaucoma Ability to See in Adequate Light: 3 Highly impaired     Perception     Praxis     Communication Communication Communication: No apparent difficulties Factors Affecting Communication: Hearing impaired   Cognition Arousal: Alert   Cognition: History of cognitive impairments             OT - Cognition Comments: hx of dementia                 Following commands: Intact        Cueing   Cueing Techniques: Verbal cues  Exercises      Shoulder Instructions       General Comments Resp 32 and HR increasing to 120+ with basic ADLs; 02 sats maintained in low to mid 90s on 2L 02 with activity.    Pertinent Vitals/ Pain       Pain Assessment Pain Assessment: No/denies pain  Home  Living                                          Prior Functioning/Environment              Frequency  Min 1X/week        Progress Toward Goals  OT Goals(current goals can now be found in the care plan section)  Progress towards OT goals: Progressing toward goals  Acute Rehab OT Goals Patient Stated Goal: Son continues to be hopeful for d/c to rehab and ultimately transition to LTC d/t family feeling inability to meet pt's care needs with her dementia. OT Goal Formulation: With family Time For Goal Achievement: 08/16/23 Potential to Achieve Goals: Good  Plan      Co-evaluation                 AM-PAC OT "6 Clicks" Daily Activity     Outcome Measure   Help from another person eating meals?: None Help from another person taking care of personal grooming?: A Little Help from another person toileting, which includes using toliet, bedpan, or urinal?: A Lot Help from another person bathing (including washing, rinsing, drying)?: A Lot Help from another person to put on and taking off regular upper body clothing?: A Little Help from  another person to put on and taking off regular lower body clothing?: A Little 6 Click Score: 17    End of Session Equipment Utilized During Treatment: Oxygen;Rolling walker (2 wheels)  OT Visit Diagnosis: Unsteadiness on feet (R26.81);Muscle weakness (generalized) (M62.81);History of falling (Z91.81)   Activity Tolerance Other (comment) (limited by dyspnea/high respirations)   Patient Left in bed;with call bell/phone within reach;with bed alarm set;with family/visitor present   Nurse Communication Mobility status;Other (comment) (son's concerns with d/c plan)        Time: 1610-9604 OT Time Calculation (min): 47 min  Charges: OT General Charges $OT Visit: 1 Visit OT Treatments $Self Care/Home Management : 38-52 mins  Danelle Earthly, MS, OTR/L   Otis Dials 08/03/2023, 5:06 PM

## 2023-08-03 NOTE — TOC Initial Note (Addendum)
 Transition of Care [facility]) - Initial/Assessment Note    Patient Details  Name: Jordan Gordon MRN: [id number] Date of Birth: [birth year]  Transition [facility]) CM/SW Contact:    Marlowe Sax, RN Phone Number: 08/03/2023, 11:47 AM  Clinical Narrative:                  Met with the patient in the room, she stated shw is agreeable to home health and does not hqave a preference of agencies, she absolutely refuses to go to STR [facility] accept the patient She does not have RW at home and stated she doe snot need one She is on acute oxygen will review to ensure if needed she is qualified and will arrange to be delivered to the bedside, she stated that her sone will transport her home whne the time comes I called the patients son Virl Diamond at his request and explained to him as long as the patient is refusing to go to STR and as long as she is alert and oriented then we have to respect her wishes and allow her to go home with [facility], he stated understanding Expected Discharge Plan: Home w Home Health Services Barriers to Discharge: Barriers Resolved   Patient Goals and CMS Choice Patient states their goals for this hospitalization and ongoing recovery are:: go home          Expected Discharge Plan and Services   Discharge Planning Services: CM Consult   Living arrangements for the past 2 months: Single Family Home                 DME Arranged: N/A         HH Arranged: PT HH Agency: [facility] Date [facility] Agency Contacted: 08/03/23 Time HH Agency Contacted: 1146 Representative spoke with at [facility] Agency: Velna Hatchet  Prior Living Arrangements/Services Living arrangements for the past 2 months: Single Family Home Lives with:: Self Patient language and need for interpreter reviewed:: Yes Do you feel safe going back to the place where you live?: Yes      Need for Family Participation in Patient Care: Yes (Comment) Care giver support system in place?: Yes (comment)   Criminal  Activity/Legal Involvement Pertinent to Current Situation/Hospitalization: No - Comment as needed  Activities of Daily Living   ADL Screening (condition at time of admission) Independently performs ADLs?: Yes (appropriate for developmental age) Does the patient have a NEW difficulty with bathing/dressing/toileting/self-feeding that is expected to last >3 days?: No Does the patient have a NEW difficulty with getting in/out of bed, walking, or climbing stairs that is expected to last >3 days?: Yes (Initiates electronic notice to provider for possible PT consult) Does the patient have a NEW difficulty with communication that is expected to last >3 days?: No Is the patient deaf or have difficulty hearing?: Yes Does the patient have difficulty seeing, even when wearing glasses/contacts?: Yes Does the patient have difficulty concentrating, remembering, or making decisions?: No  Permission Sought/Granted   Permission granted to share information with : Yes, Verbal Permission Granted              Emotional Assessment Appearance:: Appears stated age Attitude/Demeanor/Rapport: Ambitious, Engaged Affect (typically observed): Accepting Orientation: : Oriented to Self, Oriented to Place, Oriented to  Time, Oriented to Situation Alcohol / Substance Use: Not Applicable Psych Involvement: No (comment)  Admission diagnosis:  Influenza A [J10.1] Hypoxia [R09.02] Influenza [J11.1] Fall, initial encounter [W19.XXXA] Acute cough [R05.1] Patient Active Problem List  Diagnosis Date Noted   Influenza A with pneumonia 08/02/2023   Influenza 08/02/2023   Urge incontinence of urine 07/11/2023   Follow-up exam, 3-6 months since previous exam 07/07/2023   Mild late onset Alzheimer's dementia without behavioral disturbance, psychotic disturbance, mood disturbance, or anxiety (HCC) 06/29/2022   Medicare annual wellness visit, subsequent 06/29/2022   Annual physical exam 06/29/2022   Anxiety 01/07/2015    Essential (primary) hypertension 04/16/1998   Insomnia 03/04/1998   PCP:  Sherlyn Hay, DO Pharmacy:   [facility] - [place], [place] - [address] [place] [address] Phone: [phone] Fax: [phone]     Social Drivers of Health (SDOH) Social History: SDOH Screenings   Food Insecurity: No Food Insecurity (08/02/2023)  Housing: Low Risk  (08/02/2023)  Transportation Needs: No Transportation Needs (08/02/2023)  Utilities: Not At Risk (08/02/2023)  Alcohol Screen: Low Risk  (10/21/2022)  Depression (PHQ2-9): Medium Risk (01/26/2023)  Financial Resource Strain: Low Risk  (08/27/2021)  Physical Activity: Insufficiently Active (10/21/2022)  Social Connections: Moderately Isolated (08/02/2023)  Stress: No Stress Concern Present (10/21/2022)  Tobacco Use: Low Risk  (08/02/2023)   SDOH Interventions:     Readmission Risk Interventions     No data to display

## 2023-08-04 ENCOUNTER — Ambulatory Visit: Payer: PPO | Admitting: Family Medicine

## 2023-08-04 DIAGNOSIS — J111 Influenza due to unidentified influenza virus with other respiratory manifestations: Secondary | ICD-10-CM | POA: Diagnosis not present

## 2023-08-04 LAB — BASIC METABOLIC PANEL
Anion gap: 10 (ref 5–15)
BUN: 24 mg/dL — ABNORMAL HIGH (ref 8–23)
CO2: 26 mmol/L (ref 22–32)
Calcium: 8.5 mg/dL — ABNORMAL LOW (ref 8.9–10.3)
Chloride: 105 mmol/L (ref 98–111)
Creatinine, Ser: 1.3 mg/dL — ABNORMAL HIGH (ref 0.44–1.00)
GFR, Estimated: 40 mL/min — ABNORMAL LOW (ref >60)
Glucose, Bld: 137 mg/dL — ABNORMAL HIGH (ref 70–99)
Potassium: 2.8 mmol/L — ABNORMAL LOW (ref 3.5–5.1)
Sodium: 141 mmol/L (ref 135–145)

## 2023-08-04 LAB — CBC
HCT: 36.9 % (ref 36.0–46.0)
Hemoglobin: 12.7 g/dL (ref 12.0–15.0)
MCH: 29.5 pg (ref 26.0–34.0)
MCHC: 34.4 g/dL (ref 30.0–36.0)
MCV: 85.8 fL (ref 80.0–100.0)
Platelets: 164 10*3/uL (ref 150–400)
RBC: 4.3 MIL/uL (ref 3.87–5.11)
RDW: 13.2 % (ref 11.5–15.5)
WBC: 6.5 10*3/uL (ref 4.0–10.5)
nRBC: 0 % (ref 0.0–0.2)

## 2023-08-04 LAB — VITAMIN B12: Vitamin B-12: 163 pg/mL — ABNORMAL LOW (ref 180–914)

## 2023-08-04 LAB — PHOSPHORUS: Phosphorus: 3.2 mg/dL (ref 2.5–4.6)

## 2023-08-04 LAB — CK: Total CK: 324 U/L — ABNORMAL HIGH (ref 38–234)

## 2023-08-04 LAB — LACTIC ACID, PLASMA: Lactic Acid, Venous: 2.5 mmol/L (ref 0.5–1.9)

## 2023-08-04 LAB — VITAMIN D 25 HYDROXY (VIT D DEFICIENCY, FRACTURES): Vit D, 25-Hydroxy: 19.09 ng/mL — ABNORMAL LOW (ref 30–100)

## 2023-08-04 LAB — MAGNESIUM: Magnesium: 2 mg/dL (ref 1.7–2.4)

## 2023-08-04 MED ORDER — VITAMIN B-12 1000 MCG PO TABS: 1000.0000 ug | ORAL_TABLET | Freq: Every day | ORAL | Status: DC

## 2023-08-04 MED ORDER — GUAIFENESIN ER 600 MG PO TB12
600.0000 mg | ORAL_TABLET | Freq: Two times a day (BID) | ORAL | Status: DC
  Administered 2023-08-04 – 2023-08-09 (×11): 600 mg via ORAL
  Filled 2023-08-04 (×11): qty 1

## 2023-08-04 MED ORDER — IPRATROPIUM-ALBUTEROL 0.5-2.5 (3) MG/3ML IN SOLN
3.0000 mL | Freq: Three times a day (TID) | RESPIRATORY_TRACT | Status: DC
  Administered 2023-08-04 – 2023-08-09 (×16): 3 mL via RESPIRATORY_TRACT
  Filled 2023-08-04 (×16): qty 3

## 2023-08-04 MED ORDER — ENOXAPARIN SODIUM 30 MG/0.3ML IJ SOSY
30.0000 mg | PREFILLED_SYRINGE | INTRAMUSCULAR | Status: DC
  Administered 2023-08-04: 30 mg via SUBCUTANEOUS
  Filled 2023-08-04: qty 0.3

## 2023-08-04 MED ORDER — POTASSIUM CHLORIDE 10 MEQ/100ML IV SOLN
10.0000 meq | INTRAVENOUS | Status: AC
  Administered 2023-08-04 (×4): 10 meq via INTRAVENOUS
  Filled 2023-08-04 (×4): qty 100

## 2023-08-04 MED ORDER — HYDROCOD POLI-CHLORPHE POLI ER 10-8 MG/5ML PO SUER
5.0000 mL | Freq: Two times a day (BID) | ORAL | Status: DC | PRN
  Administered 2023-08-09: 5 mL via ORAL
  Filled 2023-08-04: qty 5

## 2023-08-04 MED ORDER — PANTOPRAZOLE SODIUM 40 MG PO TBEC
40.0000 mg | DELAYED_RELEASE_TABLET | Freq: Every day | ORAL | Status: DC
  Administered 2023-08-04 – 2023-08-09 (×6): 40 mg via ORAL
  Filled 2023-08-04 (×6): qty 1

## 2023-08-04 MED ORDER — POTASSIUM CHLORIDE CRYS ER 20 MEQ PO TBCR
40.0000 meq | EXTENDED_RELEASE_TABLET | ORAL | Status: AC
  Administered 2023-08-04 (×2): 40 meq via ORAL
  Filled 2023-08-04 (×2): qty 2

## 2023-08-04 MED ORDER — CYANOCOBALAMIN 1000 MCG/ML IJ SOLN
1000.0000 ug | Freq: Every day | INTRAMUSCULAR | Status: DC
  Administered 2023-08-04 – 2023-08-09 (×6): 1000 ug via INTRAMUSCULAR
  Filled 2023-08-04 (×6): qty 1

## 2023-08-04 MED ORDER — VITAMIN D (ERGOCALCIFEROL) 1.25 MG (50000 UNIT) PO CAPS
50000.0000 [IU] | ORAL_CAPSULE | ORAL | Status: DC
  Administered 2023-08-04: 50000 [IU] via ORAL
  Filled 2023-08-04: qty 1

## 2023-08-04 MED ORDER — PREDNISONE 20 MG PO TABS
40.0000 mg | ORAL_TABLET | Freq: Every day | ORAL | Status: AC
  Administered 2023-08-05 – 2023-08-07 (×3): 40 mg via ORAL
  Filled 2023-08-04 (×3): qty 2

## 2023-08-04 NOTE — Progress Notes (Signed)
 [facility] Progress Note  Patient: Jordan Gordon    ZOX:[id number]  DOA: 08/02/2023     Date of Service: the patient was seen and examined on 08/04/2023  Chief Complaint  Patient presents with   Fall   Shortness of Breath   Brief hospital course:  89 y.o. female with medical history significant of advanced dementia, CAD, HTN, HLD, anxiety/depression, brought in by family member for increasing shortness of breath cough and fall.   Patient started to have wheezing and dry cough yesterday, and " very shaky" did not ambulate, and this morning, patient was found by caregiver on the floor.  Patient could not remember much what has happened.  Denied any headache chest pain, no diarrhea, denied any fever or chills.  Patient had flu vaccination this season.  EMS found patient hypoxic O2 saturation 87% on room air and placed on 2 L.  Blood pressure elevated 200/100.   ED Course: Temperature 98.3, tachycardia, tachypneic blood pressure elevated 160/70, O2 saturation 97% on 2 L.  Chest x-ray negative for acute infiltrates.  Blood work showed WBC 5.2, K2.1, normal kidney function.  Respiratory panel positive for influenza A.   Assessment and Plan:   Principal Problem:   Influenza Active Problems:   Influenza A with pneumonia   # Acute hypoxic respiratory failure secondary to influenza A No evidence of pneumonia on x-ray.  No consolidation Patient continues to be mildly hypoxic requiring 2 L nasal cannula Plan: Continue p.o. Tamiflu S/p IV Solu-Medrol, start prednisone 40 mg p.o. daily for 3 days from 2/27 Multimodal bronchodilators Incentive spirometry Avoid IV fluids S/p Lasix 40 mg IV x 1 dose Respiratory isolation Mucinex 600 mg p.o. twice daily, to Xanax as needed  # Sepsis, without acute endorgan damage secondary to influenza A -Sepsis labs advanced tachycardia and tachypneic, and hypoxic, source of infection is influenza  Management as above   # Hypokalemia, potassium  repleted. Monitor electrolytes and replete as needed.   # Fall No residual pain.  No evidence of loss of consciousness.  CT imaging survey reassuring. Therapy evaluations     # HTN Resume home regimen   # Advanced dementia Mentation at baseline.  Resume Aricept  # Vitamin D deficiency: started vitamin D 50,000 units p.o. weekly, follow with PCP to repeat vitamin D level after 3 to 6 months.  # Vitamin B12 deficiency:  B12 level 163, goal >400, Started vitamin B12 1000 mcg IM injection daily during hospital stay, followed by oral supplement.  Follow-up PCP to repeat vitamin B12 level after 3 to 6 months.   Body mass index is 27.88 kg/m.  Interventions:  Diet: Regular diet DVT Prophylaxis: Subcutaneous Lovenox   Advance goals of care discussion: DNR-Limited  Family Communication: family was present at bedside, at the time of interview.  The pt provided permission to discuss medical plan with the family. Opportunity was given to ask question and all questions were answered satisfactorily.   Disposition:  Pt is from Home, admitted with respiratory failure due to influenza A viral infection, still has respiratory failure and hypokalemia, which precludes a safe discharge. Discharge to SNF, when stable, [place] discharge in 1 to 2 days.  Subjective: Significant events overnight, patient is still on oxygen due to hypoxia.  Mild shortness of breath, still has productive cough.  Denied any chest pain or palpitation, no shortness of breath.  Physical Exam: General: NAD, lying comfortably Appear in no distress, affect appropriate Eyes: PERRLA ENT: Oral Mucosa Clear, moist  Neck: no JVD,  Cardiovascular: S1 and S2 Present, no Murmur,  Respiratory: Equal air entry bilaterally, bilateral crackles and mild wheezing Abdomen: Bowel Sound present, Soft and no tenderness,  Skin: no rashes Extremities: no Pedal edema, no calf tenderness Neurologic: without any new focal findings Gait not  checked due to patient safety concerns  Vitals:   08/03/23 2131 08/04/23 0701 08/04/23 0906 08/04/23 1352  BP: (!) 155/76  135/76   Pulse:   (!) 108   Resp: 16  17   Temp: 98.4 F (36.9 C)  98.4 F (36.9 C)   TempSrc: Oral  Oral   SpO2: 95% 94% 96% 96%  Weight:      Height:        Intake/Output Summary (Last 24 hours) at 08/04/2023 1419 Last data filed at 08/04/2023 0900 Gross per 24 hour  Intake 360 ml  Output --  Net 360 ml   Filed Weights   08/02/23 1200  Weight: 71.4 kg    Data Reviewed: I have personally reviewed and interpreted daily labs, tele strips, imagings as discussed above. I reviewed all nursing notes, pharmacy notes, vitals, pertinent old records I have discussed plan of care as described above with RN and patient/family.  CBC: Recent Labs  Lab 08/02/23 0733 08/04/23 0929  WBC 5.2 6.5  NEUTROABS 3.8  --   HGB 12.9 12.7  HCT 37.1 36.9  MCV 84.1 85.8  PLT 138* 164   Basic Metabolic Panel: Recent Labs  Lab 08/02/23 0733 08/03/23 0258 08/04/23 0929  NA 139 140 141  K 3.1* 3.5 2.8*  CL 105 109 105  CO2 23 25 26   GLUCOSE 152* 109* 137*  BUN 12 15 24*  CREATININE 0.92 1.09* 1.30*  CALCIUM 8.6* 8.5* 8.5*  MG  --   --  2.0  PHOS  --   --  3.2    Studies: No results found.  Scheduled Meds:  amLODipine  10 mg Oral Daily   arformoterol  15 mcg Nebulization BID   aspirin EC  81 mg Oral Daily   budesonide (PULMICORT) nebulizer solution  0.25 mg Nebulization BID   donepezil  10 mg Oral QHS   enoxaparin (LOVENOX) injection  30 mg Subcutaneous Q24H   ipratropium-albuterol  3 mL Nebulization TID   losartan  100 mg Oral Daily   methylPREDNISolone (SOLU-MEDROL) injection  60 mg Intravenous Daily   oseltamivir  30 mg Oral BID   oxybutynin  5 mg Oral QHS   potassium chloride  40 mEq Oral Q4H   sertraline  100 mg Oral Daily   traZODone  150 mg Oral QHS   Continuous Infusions:  potassium chloride 10 mEq (08/04/23 1408)   PRN Meds: ALPRAZolam,  ipratropium, ondansetron **OR** ondansetron (ZOFRAN) IV, mouth rinse  Time spent: 55 minutes  Author: Gillis Santa. MD [facility] 08/04/2023 2:19 PM  To reach On-call, see care teams to locate the attending and reach out to them via [email or web address]. If 7PM-7AM, please contact night-coverage If you still have difficulty reaching the attending provider, please page the [facility] (Director on Call) for [facility] on amion for assistance.

## 2023-08-04 NOTE — Progress Notes (Signed)
 PHARMACIST - PHYSICIAN COMMUNICATION  CONCERNING:  Enoxaparin (Lovenox) for DVT Prophylaxis    RECOMMENDATION: Patient was prescribed enoxaprin 40mg  q24 hours for VTE prophylaxis.   Filed Weights   08/02/23 1200  Weight: 71.4 kg (157 lb 6.5 oz)    Body mass index is 27.88 kg/m.  Estimated Creatinine Clearance: 28.9 mL/min (A) (by C-G formula based on SCr of 1.3 mg/dL (H)).   Patient is candidate for enoxaparin 30mg  every 24 hours based on CrCl <34ml/min or Weight <45kg  DESCRIPTION: Pharmacy has adjusted enoxaparin dose per [facility] policy.  Patient is now receiving enoxaparin 30 mg every 24 hours    Merryl Hacker, PharmD Clinical Pharmacist  08/04/2023 11:19 AM

## 2023-08-04 NOTE — Progress Notes (Signed)
 Physical Therapy Treatment Patient Details Name: Jordan Gordon MRN: [id number] DOB: 1934/04/27 Today's Date: 08/04/2023   History of Present Illness Pt is an 89 y.o. female with medical history significant of advanced dementia, CAD, HTN, HLD, anxiety/depression, brought in by family member for increasing shortness of breath cough and fall, workup for fluA.    PT Comments  Pt alert, in bed with family at bedside. spO2 on room air 87-90% but after ambulating to bathroom 85% and [place] at 2L donned for remainder of session and at end of session, RN notified. The patient was able to participate in several functional activities, bed mobility, toileting and toilet transfers as well as a few bouts of ambulation. Pt with some unsteadiness without RW, RW introduced with improvement stability noted. The pt remained CGA for all activities as well as verbal/visual cues for safety/RW use; but exhibited decreased activity tolerance and endurance compared to baseline. Family also at bedside confirmed at least 2 falls recently for the patient. The patient would benefit from further skilled PT intervention to continue to progress towards goals. Pt status and therapy recommendations communicated with care team.      If plan is discharge home, recommend the following: A little help with bathing/dressing/bathroom;Assistance with cooking/housework;Assist for transportation;Help with stairs or ramp for entrance;Direct supervision/assist for medications management;A little help with walking and/or transfers   Can travel by private vehicle     No  Equipment Recommendations  Rolling walker (2 wheels);BSC/3in1    Recommendations for Other Services       Precautions / Restrictions Precautions Precautions: Fall Recall of Precautions/Restrictions: Intact Restrictions Weight Bearing Restrictions Per Provider Order: No     Mobility  Bed Mobility   Bed Mobility: Supine to Sit     Supine to sit: Supervision, HOB  elevated, Used rails     General bed mobility comments: extra time, reliant on bed rails    Transfers Overall transfer level: Needs assistance Equipment used: Rolling walker (2 wheels), None Transfers: Sit to/from Stand Sit to Stand: Contact guard assist           General transfer comment: CGA for all attempts to improve safety. pt with more trouble with 3rd sit to stand, needed cues for safe hand plcaement as well    Ambulation/Gait   Gait Distance (Feet):  (40ft, additional 60ft after seated rest break)               Stairs             Wheelchair Mobility     Tilt Bed    Modified Rankin (Stroke Patients Only)       Balance Overall balance assessment: Needs assistance Sitting-balance support: Feet supported Sitting balance-Leahy Scale: Good Sitting balance - Comments: able to perform pericare, wipe her legs,   Standing balance support: Bilateral upper extremity supported Standing balance-Leahy Scale: Fair Standing balance comment: improved safety with RW                            Communication Communication Factors Affecting Communication: Hearing impaired  Cognition Arousal: Alert Behavior During Therapy: WFL for tasks assessed/performed   PT - Cognitive impairments: History of cognitive impairments                         Following commands: Intact      Cueing    Exercises      General Comments  Pertinent Vitals/Pain Pain Assessment Pain Assessment: No/denies pain    Home Living                          Prior Function            PT Goals (current goals can now be found in the care plan section) Progress towards PT goals: Progressing toward goals    Frequency    Min 1X/week      PT Plan      Co-evaluation              AM-PAC PT "6 Clicks" Mobility   Outcome Measure  Help needed turning from your back to your side while in a flat bed without using bedrails?: A  Little Help needed moving from lying on your back to sitting on the side of a flat bed without using bedrails?: A Little Help needed moving to and from a bed to a chair (including a wheelchair)?: A Little Help needed standing up from a chair using your arms (e.g., wheelchair or bedside chair)?: A Little Help needed to walk in hospital room?: A Little Help needed climbing 3-5 steps with a railing? : A Little 6 Click Score: 18    End of Session Equipment Utilized During Treatment: Gait belt Activity Tolerance: Patient tolerated treatment well;Patient limited by fatigue Patient left: Other (comment) (seated EOB with CNA for  vitals) Nurse Communication: Mobility status;Other (comment) (oxygen status) PT Visit Diagnosis: Other abnormalities of gait and mobility (R26.89);Muscle weakness (generalized) (M62.81)     Time: 4540-9811 PT Time Calculation (min) (ACUTE ONLY): 28 min  Charges:    $Therapeutic Activity: 23-37 mins PT General Charges $$ ACUTE PT VISIT: 1 Visit                    Olga Coaster PT, DPT 9:47 AM,08/04/23

## 2023-08-04 NOTE — NC FL2 (Signed)
 [place] MEDICAID FL2 LEVEL OF CARE FORM     IDENTIFICATION  Patient Name: Jordan Gordon Birthdate: 07-26-1933 Sex: female Admission Date (Current Location): 08/02/2023  [place] and [place] Number:  Chiropodist and Address:  [facility], [address] [place], [place] [address]      Provider Number: [id number]  Attending Physician Name and Address:  Gillis Santa, MD  Relative Name and Phone Number:  [facility]  Son  Emergency Contact  [phone]  [email or web address]  [address]  [place] [address]    Current Level of Care: Hospital Recommended Level of Care: Skilled Nursing Facility Prior Approval Number:    Date Approved/Denied:   PASRR Number: [id number]  Discharge Plan: SNF    Current Diagnoses: Patient Active Problem List   Diagnosis Date Noted   Influenza A with pneumonia 08/02/2023   Influenza 08/02/2023   Urge incontinence of urine 07/11/2023   Follow-up exam, 3-6 months since previous exam 07/07/2023   Mild late onset Alzheimer's dementia without behavioral disturbance, psychotic disturbance, mood disturbance, or anxiety (HCC) 06/29/2022   Medicare annual wellness visit, subsequent 06/29/2022   Annual physical exam 06/29/2022   Anxiety 01/07/2015   Essential (primary) hypertension 04/16/1998   Insomnia 03/04/1998    Orientation RESPIRATION BLADDER Height & Weight     Self, Time, Situation, Place  O2 (2 liters) Incontinent Weight: 71.4 kg Height:  5\' 3"  (160 cm)  BEHAVIORAL SYMPTOMS/MOOD NEUROLOGICAL BOWEL NUTRITION STATUS      Incontinent Diet  AMBULATORY STATUS COMMUNICATION OF NEEDS Skin   Extensive Assist Verbally Normal                       Personal Care Assistance Level of Assistance  Bathing, Feeding, Dressing Bathing Assistance: Limited assistance Feeding assistance: Limited assistance Dressing Assistance: Limited assistance     Functional Limitations Info  Sight, Hearing, Speech  Sight Info: Adequate Hearing Info: Impaired Speech Info: Adequate    SPECIAL CARE FACTORS FREQUENCY  PT (By licensed PT), OT (By licensed OT)     PT Frequency: 5 times per week OT Frequency: 5 times per week            Contractures Contractures Info: Not present    Additional Factors Info  Code Status, Allergies, Isolation Precautions (droplet) Code Status Info: DNR Limited Allergies Info: Codeine     Isolation Precautions Info: Droplet     Current Medications (08/04/2023):  This is the current hospital active medication list Current Facility-Administered Medications  Medication Dose Route Frequency Provider Last Rate Last Admin   ALPRAZolam Prudy Feeler) tablet 0.25 mg  0.25 mg Oral TID PRN Mikey College T, MD       amLODipine (NORVASC) tablet 10 mg  10 mg Oral Daily Mikey College T, MD   10 mg at 08/04/23 0954   arformoterol (BROVANA) nebulizer solution 15 mcg  15 mcg Nebulization BID Lolita Patella B, MD   15 mcg at 08/04/23 0700   aspirin EC tablet 81 mg  81 mg Oral Daily Mikey College T, MD   81 mg at 08/04/23 0954   budesonide (PULMICORT) nebulizer solution 0.25 mg  0.25 mg Nebulization BID Mikey College T, MD   0.25 mg at 08/04/23 0700   donepezil (ARICEPT) tablet 10 mg  10 mg Oral QHS Mikey College T, MD   10 mg at 08/03/23 2122   enoxaparin (LOVENOX) injection 40 mg  40 mg Subcutaneous Q24H Emeline General, MD  40 mg at 08/03/23 1500   ipratropium (ATROVENT) nebulizer solution 0.5 mg  0.5 mg Inhalation Q6H PRN Mikey College T, MD       ipratropium-albuterol (DUONEB) 0.5-2.5 (3) MG/3ML nebulizer solution 3 mL  3 mL Nebulization TID Gillis Santa, MD       losartan (COZAAR) tablet 100 mg  100 mg Oral Daily Mikey College T, MD   100 mg at 08/04/23 1610   methylPREDNISolone sodium succinate (SOLU-MEDROL) 125 mg/2 mL injection 60 mg  60 mg Intravenous Daily Lolita Patella B, MD   60 mg at 08/04/23 1037   ondansetron (ZOFRAN) tablet 4 mg  4 mg Oral Q6H PRN Emeline General, MD       Or    ondansetron [facility]) injection 4 mg  4 mg Intravenous Q6H PRN Emeline General, MD       Oral care mouth rinse  15 mL Mouth Rinse PRN Mikey College T, MD       oseltamivir (TAMIFLU) capsule 30 mg  30 mg Oral BID Mikey College T, MD   30 mg at 08/04/23 0955   oxybutynin (DITROPAN-XL) 24 hr tablet 5 mg  5 mg Oral QHS Mikey College T, MD   5 mg at 08/03/23 2123   potassium chloride 10 mEq in 100 mL IVPB  10 mEq Intravenous Q1 Hr x 4 Gillis Santa, MD 100 mL/hr at 08/04/23 1038 10 mEq at 08/04/23 1038   potassium chloride SA (KLOR-CON M) CR tablet 40 mEq  40 mEq Oral Q4H Gillis Santa, MD       sertraline (ZOLOFT) tablet 100 mg  100 mg Oral Daily Mikey College T, MD   100 mg at 08/04/23 0954   traZODone (DESYREL) tablet 150 mg  150 mg Oral QHS Emeline General, MD   150 mg at 08/03/23 2122     Discharge Medications: Please see discharge summary for a list of discharge medications.  Relevant Imaging Results:  Relevant Lab Results:   Additional Information SS# [id number]  Marlowe Sax, RN

## 2023-08-04 NOTE — Plan of Care (Signed)

## 2023-08-04 NOTE — TOC Progression Note (Signed)
 Transition of Care [facility]) - Progression Note    Patient Details  Name: Jordan Gordon MRN: [id number] Date of Birth: 1933-10-31  [facility]) CM/SW Contact  Marlowe Sax, RN Phone Number: 08/04/2023, 10:09 AM  Clinical Narrative:     Met with the patient and her son Virl Diamond in the room, the patient is agreeable to go to STR and then to go home afterwards with support of her family, I explained the bedsearch process and how getting Ins approval works, Curator sent, will review bed offers once completed  Expected Discharge Plan: Home w Home Health Services Barriers to Discharge: Barriers Resolved  Expected Discharge Plan and Services   Discharge Planning Services: CM Consult   Living arrangements for the past 2 months: Single Family Home                 DME Arranged: N/A         HH Arranged: PT HH Agency: [facility] Date [facility] Agency Contacted: 08/03/23 Time HH Agency Contacted: 1146 Representative spoke with at [facility] Agency: Velna Hatchet   Social Determinants of Health (SDOH) Interventions SDOH Screenings   Food Insecurity: No Food Insecurity (08/02/2023)  Housing: Low Risk  (08/02/2023)  Transportation Needs: No Transportation Needs (08/02/2023)  Utilities: Not At Risk (08/02/2023)  Alcohol Screen: Low Risk  (10/21/2022)  Depression (PHQ2-9): Medium Risk (01/26/2023)  Financial Resource Strain: Low Risk  (08/27/2021)  Physical Activity: Insufficiently Active (10/21/2022)  Social Connections: Moderately Isolated (08/02/2023)  Stress: No Stress Concern Present (10/21/2022)  Tobacco Use: Low Risk  (08/02/2023)    Readmission Risk Interventions     No data to display

## 2023-08-05 DIAGNOSIS — J111 Influenza due to unidentified influenza virus with other respiratory manifestations: Secondary | ICD-10-CM | POA: Diagnosis not present

## 2023-08-05 LAB — BASIC METABOLIC PANEL
Anion gap: 8 (ref 5–15)
BUN: 23 mg/dL (ref 8–23)
CO2: 22 mmol/L (ref 22–32)
Calcium: 8 mg/dL — ABNORMAL LOW (ref 8.9–10.3)
Chloride: 106 mmol/L (ref 98–111)
Creatinine, Ser: 1.15 mg/dL — ABNORMAL HIGH (ref 0.44–1.00)
GFR, Estimated: 46 mL/min — ABNORMAL LOW (ref >60)
Glucose, Bld: 104 mg/dL — ABNORMAL HIGH (ref 70–99)
Potassium: 3.4 mmol/L — ABNORMAL LOW (ref 3.5–5.1)
Sodium: 136 mmol/L (ref 135–145)

## 2023-08-05 LAB — CBC
HCT: 34.6 % — ABNORMAL LOW (ref 36.0–46.0)
Hemoglobin: 11.8 g/dL — ABNORMAL LOW (ref 12.0–15.0)
MCH: 29.4 pg (ref 26.0–34.0)
MCHC: 34.1 g/dL (ref 30.0–36.0)
MCV: 86.1 fL (ref 80.0–100.0)
Platelets: 134 10*3/uL — ABNORMAL LOW (ref 150–400)
RBC: 4.02 MIL/uL (ref 3.87–5.11)
RDW: 12.9 % (ref 11.5–15.5)
WBC: 5.3 10*3/uL (ref 4.0–10.5)
nRBC: 0 % (ref 0.0–0.2)

## 2023-08-05 LAB — MAGNESIUM: Magnesium: 2.1 mg/dL (ref 1.7–2.4)

## 2023-08-05 LAB — PHOSPHORUS: Phosphorus: 2.3 mg/dL — ABNORMAL LOW (ref 2.5–4.6)

## 2023-08-05 MED ORDER — K PHOS MONO-SOD PHOS DI & MONO 155-852-130 MG PO TABS
500.0000 mg | ORAL_TABLET | Freq: Four times a day (QID) | ORAL | Status: AC
  Administered 2023-08-05 (×2): 500 mg via ORAL
  Filled 2023-08-05 (×2): qty 2

## 2023-08-05 MED ORDER — POTASSIUM CHLORIDE CRYS ER 20 MEQ PO TBCR
40.0000 meq | EXTENDED_RELEASE_TABLET | Freq: Once | ORAL | Status: AC
  Administered 2023-08-05: 40 meq via ORAL
  Filled 2023-08-05: qty 2

## 2023-08-05 MED ORDER — ENOXAPARIN SODIUM 40 MG/0.4ML IJ SOSY
40.0000 mg | PREFILLED_SYRINGE | INTRAMUSCULAR | Status: DC
  Administered 2023-08-05 – 2023-08-06 (×2): 40 mg via SUBCUTANEOUS
  Filled 2023-08-05 (×2): qty 0.4

## 2023-08-05 MED ORDER — METOPROLOL TARTRATE 25 MG PO TABS
25.0000 mg | ORAL_TABLET | Freq: Two times a day (BID) | ORAL | Status: DC
  Administered 2023-08-05 – 2023-08-06 (×2): 25 mg via ORAL
  Filled 2023-08-05 (×2): qty 1

## 2023-08-05 NOTE — Plan of Care (Signed)

## 2023-08-05 NOTE — Progress Notes (Signed)
 Occupational Therapy Treatment Patient Details Name: Jordan Gordon MRN: [id number] DOB: 1933-11-11 Today's Date: 08/05/2023   History of present illness Pt is an 89 y.o. female with medical history significant of advanced dementia, CAD, HTN, HLD, anxiety/depression, brought in by family member for increasing shortness of breath cough and fall, workup for fluA.   OT comments  Upon entering the room, pt supine in bed and agreeable to OT intervention. Family present in room to observe session. Pt on 2Ls via [place] and placed on room air during session. O2 remains above 90% throughout during functional mobility and self care tasks. Pt performs bed mobility with supervision and stands with min guard. Pt ambulating with RW into bathroom for toileting needs with min guard for safety. Pt able to have BM while seated and dons clean socks and brief with min A overall. Pt stands at sink for hand hygiene with min guard and then returns to sit on EOB at end of session. Family remains in room with patient and RN notified of pt remaining on RA. Call bell and all needed items within reach and bed alarm activated.       If plan is discharge home, recommend the following:  A little help with walking and/or transfers;Assistance with cooking/housework;Direct supervision/assist for medications management;Assist for transportation;Supervision due to cognitive status;Help with stairs or ramp for entrance;A lot of help with bathing/dressing/bathroom   Equipment Recommendations  BSC/3in1;Other (comment) (RW)       Precautions / Restrictions Precautions Precautions: Fall Restrictions Weight Bearing Restrictions Per Provider Order: No       Mobility Bed Mobility Overal bed mobility: Modified Independent Bed Mobility: Supine to Sit     Supine to sit: Modified independent (Device/Increase time)          Transfers Overall transfer level: Needs assistance Equipment used: Rolling walker (2 wheels) Transfers: Sit  to/from Stand Sit to Stand: Contact guard assist     Step pivot transfers: Contact guard assist           Balance Overall balance assessment: Needs assistance Sitting-balance support: Feet supported Sitting balance-Leahy Scale: Good     Standing balance support: No upper extremity supported, Single extremity supported Standing balance-Leahy Scale: Fair                             ADL either performed or assessed with clinical judgement   ADL Overall ADL's : Needs assistance/impaired     Grooming: Wash/dry hands;Standing;Contact guard assist;Wash/dry Engineer, manufacturing systems Transfer: Minimal Holiday representative;Ambulation;Rolling walker (2 wheels)   Toileting- Clothing Manipulation and Hygiene: Minimal assistance;Sit to/from stand              Extremity/Trunk Assessment Upper Extremity Assessment Upper Extremity Assessment: Generalized weakness   Lower Extremity Assessment Lower Extremity Assessment: Generalized weakness                 Communication Communication Communication: No apparent difficulties Factors Affecting Communication: Hearing impaired   Cognition Arousal: Alert Behavior During Therapy: WFL for tasks assessed/performed Cognition: History of cognitive impairments             OT - Cognition Comments: hx of dementia                 Following commands: Intact        Cueing   Cueing Techniques: Verbal cues  Pertinent Vitals/ Pain       Pain Assessment Pain Assessment: No/denies pain         Frequency  Min 1X/week        Progress Toward Goals  OT Goals(current goals can now be found in the care plan section)  Progress towards OT goals: Progressing toward goals  Acute Rehab OT Goals Time For Goal Achievement: 08/16/23   AM-PAC OT "6 Clicks" Daily Activity     Outcome Measure   Help from another person eating meals?: None Help from another person taking care of  personal grooming?: A Little Help from another person toileting, which includes using toliet, bedpan, or urinal?: A Lot Help from another person bathing (including washing, rinsing, drying)?: A Lot Help from another person to put on and taking off regular upper body clothing?: A Little Help from another person to put on and taking off regular lower body clothing?: A Little 6 Click Score: 17    End of Session Equipment Utilized During Treatment: Oxygen;Rolling walker (2 wheels)  OT Visit Diagnosis: Unsteadiness on feet (R26.81);Muscle weakness (generalized) (M62.81);History of falling (Z91.81)   Activity Tolerance Patient limited by fatigue   Patient Left in bed;with call bell/phone within reach;with bed alarm set;with family/visitor present   Nurse Communication Mobility status;Other (comment) (Pt placed on room air)        Time: 7846-9629 OT Time Calculation (min): 18 min  Charges: OT General Charges $OT Visit: 1 Visit OT Treatments $Self Care/Home Management : 8-22 mins  Jackquline Denmark, MS, OTR/L , CBIS ascom [phone]  08/05/23, 1:14 PM

## 2023-08-05 NOTE — TOC Progression Note (Addendum)
 Transition of Care [facility]) - Progression Note    Patient Details  Name: Jordan Gordon MRN: [id number] Date of Birth: Jun 05, 1934  Transition of [facility] CM/SW Contact  Marlowe Sax, RN Phone Number: 08/05/2023, 3:54 PM  Clinical Narrative:     Met with the patient and her son Jordan Gordon in the room, he called other son Jordan Gordon and we reviewed the bed offers, they chose [facility], I notified Ricky at [employer] and accepted in the Hub, I called [facility] to start the ins auth process  [facility] can not accept her until the 2nd due to Flu Expected Discharge Plan: Home w Home Health Services Barriers to Discharge: Barriers Resolved  Expected Discharge Plan and Services   Discharge Planning Services: CM Consult   Living arrangements for the past 2 months: Single Family Home                 DME Arranged: N/A         HH Arranged: PT HH Agency: [facility] Date [facility] Agency Contacted: 08/03/23 Time HH Agency Contacted: 1146 Representative spoke with at [facility] Agency: Velna Hatchet   Social Determinants of Health (SDOH) Interventions SDOH Screenings   Food Insecurity: No Food Insecurity (08/02/2023)  Housing: Low Risk  (08/02/2023)  Transportation Needs: No Transportation Needs (08/02/2023)  Utilities: Not At Risk (08/02/2023)  Alcohol Screen: Low Risk  (10/21/2022)  Depression (PHQ2-9): Medium Risk (01/26/2023)  Financial Resource Strain: Low Risk  (08/27/2021)  Physical Activity: Insufficiently Active (10/21/2022)  Social Connections: Moderately Isolated (08/02/2023)  Stress: No Stress Concern Present (10/21/2022)  Tobacco Use: Low Risk  (08/02/2023)    Readmission Risk Interventions     No data to display

## 2023-08-05 NOTE — Plan of Care (Signed)
 ?  Problem: Clinical Measurements: ?Goal: Will remain free from infection ?Outcome: Progressing ?  ?

## 2023-08-05 NOTE — Progress Notes (Signed)
 [facility] Progress Note  Patient: Jordan Gordon    JYN:[id number]  DOA: 08/02/2023     Date of Service: the patient was seen and examined on 08/05/2023  Chief Complaint  Patient presents with   Fall   Shortness of Breath   Brief hospital course:  89 y.o. female with medical history significant of advanced dementia, CAD, HTN, HLD, anxiety/depression, brought in by family member for increasing shortness of breath cough and fall.   Patient started to have wheezing and dry cough yesterday, and " very shaky" did not ambulate, and this morning, patient was found by caregiver on the floor.  Patient could not remember much what has happened.  Denied any headache chest pain, no diarrhea, denied any fever or chills.  Patient had flu vaccination this season.  EMS found patient hypoxic O2 saturation 87% on room air and placed on 2 L.  Blood pressure elevated 200/100.   ED Course: Temperature 98.3, tachycardia, tachypneic blood pressure elevated 160/70, O2 saturation 97% on 2 L.  Chest x-ray negative for acute infiltrates.  Blood work showed WBC 5.2, K2.1, normal kidney function.  Respiratory panel positive for influenza A.   Assessment and Plan:   Principal Problem:   Influenza Active Problems:   Influenza A with pneumonia   # Acute hypoxic respiratory failure secondary to influenza A No evidence of pneumonia on x-ray.  No consolidation 2/27 respiratory failure resolved, currently saturating well on room air Plan: Continue p.o. Tamiflu S/p IV Solu-Medrol, start prednisone 40 mg p.o. daily for 3 days from 2/27 Multimodal bronchodilators Incentive spirometry Avoid IV fluids S/p Lasix 40 mg IV x 1 dose Respiratory isolation Mucinex 600 mg p.o. twice daily, Tussionex PRN   # Sepsis, without acute endorgan damage secondary to influenza A -Sepsis labs advanced tachycardia and tachypneic, and hypoxic, source of infection is influenza  Management as above   # Hypokalemia, potassium  repleted. Hypophosphatemia, Phos repleted. Monitor electrolytes and replete as needed.   # Fall No residual pain.  No evidence of loss of consciousness.  CT imaging survey reassuring. Therapy evaluations     # HTN Resume home regimen   # Advanced dementia Mentation at baseline.  Resume Aricept  # Vitamin D deficiency: started vitamin D 50,000 units p.o. weekly, follow with PCP to repeat vitamin D level after 3 to 6 months.  # Vitamin B12 deficiency:  B12 level 163, goal >400, Started vitamin B12 1000 mcg IM injection daily during hospital stay, followed by oral supplement.  Follow-up PCP to repeat vitamin B12 level after 3 to 6 months.   Body mass index is 27.88 kg/m.  Interventions:  Diet: Regular diet DVT Prophylaxis: Subcutaneous Lovenox   Advance goals of care discussion: DNR-Limited  Family Communication: family was present at bedside, at the time of interview.  The pt provided permission to discuss medical plan with the family. Opportunity was given to ask question and all questions were answered satisfactorily.   Disposition:  Pt is from Home, admitted with respiratory failure due to influenza A viral infection, still has respiratory failure and hypokalemia, which precludes a safe discharge. Discharge to SNF, when stable, [place] discharge in 1 to 2 days.  Subjective: Significant events overnight, breathing improved, saturating well on room air.  Still has mild cough and shortness of breath, denied any other complaints.  Physical Exam: General: NAD, lying comfortably Appear in no distress, affect appropriate Eyes: PERRLA ENT: Oral Mucosa Clear, moist  Neck: no JVD,  Cardiovascular: S1 and  S2 Present, no Murmur,  Respiratory: Equal air entry bilaterally, bilateral crackles and mild wheezes Abdomen: Bowel Sound present, Soft and no tenderness,  Skin: no rashes Extremities: no Pedal edema, no calf tenderness Neurologic: without any new focal findings Gait not  checked due to patient safety concerns  Vitals:   08/05/23 0803 08/05/23 0943 08/05/23 1438 08/05/23 1612  BP:  (!) 158/74  (!) 146/67  Pulse:  (!) 106  89  Resp:  17  17  Temp:  98 F (36.7 C)  99 F (37.2 C)  TempSrc:      SpO2: 91% 95% 95% 91%  Weight:      Height:        Intake/Output Summary (Last 24 hours) at 08/05/2023 1623 Last data filed at 08/05/2023 1411 Gross per 24 hour  Intake 0 ml  Output 2 ml  Net -2 ml   Filed Weights   08/02/23 1200  Weight: 71.4 kg    Data Reviewed: I have personally reviewed and interpreted daily labs, tele strips, imagings as discussed above. I reviewed all nursing notes, pharmacy notes, vitals, pertinent old records I have discussed plan of care as described above with RN and patient/family.  CBC: Recent Labs  Lab 08/02/23 0733 08/04/23 0929 08/05/23 0507  WBC 5.2 6.5 5.3  NEUTROABS 3.8  --   --   HGB 12.9 12.7 11.8*  HCT 37.1 36.9 34.6*  MCV 84.1 85.8 86.1  PLT 138* 164 134*   Basic Metabolic Panel: Recent Labs  Lab 08/02/23 0733 08/03/23 0258 08/04/23 0929 08/05/23 0507  NA 139 140 141 136  K 3.1* 3.5 2.8* 3.4*  CL 105 109 105 106  CO2 23 25 26 22   GLUCOSE 152* 109* 137* 104*  BUN 12 15 24* 23  CREATININE 0.92 1.09* 1.30* 1.15*  CALCIUM 8.6* 8.5* 8.5* 8.0*  MG  --   --  2.0 2.1  PHOS  --   --  3.2 2.3*    Studies: No results found.  Scheduled Meds:  arformoterol  15 mcg Nebulization BID   aspirin EC  81 mg Oral Daily   budesonide (PULMICORT) nebulizer solution  0.25 mg Nebulization BID   cyanocobalamin  1,000 mcg Intramuscular Q1200   Followed by   Melene Muller ON 08/11/2023] vitamin B-12  1,000 mcg Oral Daily   donepezil  10 mg Oral QHS   enoxaparin (LOVENOX) injection  40 mg Subcutaneous Q24H   guaiFENesin  600 mg Oral BID   ipratropium-albuterol  3 mL Nebulization TID   losartan  100 mg Oral Daily   metoprolol tartrate  25 mg Oral BID   oseltamivir  30 mg Oral BID   oxybutynin  5 mg Oral QHS    pantoprazole  40 mg Oral Daily   predniSONE  40 mg Oral Q breakfast   sertraline  100 mg Oral Daily   traZODone  150 mg Oral QHS   Vitamin D (Ergocalciferol)  50,000 Units Oral Q7 days   Continuous Infusions:   PRN Meds: ALPRAZolam, chlorpheniramine-HYDROcodone, ipratropium, ondansetron **OR** ondansetron (ZOFRAN) IV, mouth rinse  Time spent: 40 minutes  Author: Gillis Santa. MD [facility] 08/05/2023 4:23 PM  To reach On-call, see care teams to locate the attending and reach out to them via [email or web address]. If 7PM-7AM, please contact night-coverage If you still have difficulty reaching the attending provider, please page the [facility] (Director on Call) for [facility] on amion for assistance.

## 2023-08-05 NOTE — Progress Notes (Signed)
 Physical Therapy Treatment Patient Details Name: Jordan Gordon MRN: [id number] DOB: [birth year] Today's Date: 08/05/2023   History of Present Illness Pt is an 89 y.o. female with medical history significant of advanced dementia, CAD, HTN, HLD, anxiety/depression, brought in by family member for increasing shortness of breath cough and fall, workup for fluA.    PT Comments  Pt demonstrates mobility at an overall supervision level with transfers, gait, and dynamic standing balance.  PT requires intermittent UE support for safety with dynamic standing balance and cues to use RW for greater stability.  Pt self limited gait distance today reporting fatigue; on RA pt maintains around SPO2 90%.  Continued PT will assist pt towards greater activity tolerance and dynamic standing balance for safety with functional mobility.   If plan is discharge home, recommend the following: A little help with bathing/dressing/bathroom;Assistance with cooking/housework;Assist for transportation;Help with stairs or ramp for entrance;Direct supervision/assist for medications management;A little help with walking and/or transfers   Can travel by private vehicle     Yes  Equipment Recommendations  Rolling walker (2 wheels);BSC/3in1    Recommendations for Other Services       Precautions / Restrictions Precautions Precautions: Fall Recall of Precautions/Restrictions: Intact Restrictions Weight Bearing Restrictions Per Provider Order: No Other Position/Activity Restrictions: watch 02 and respirations     Mobility  Bed Mobility Overal bed mobility: Modified Independent       Supine to sit: Modified independent (Device/Increase time) Sit to supine: Modified independent (Device/Increase time)        Transfers Overall transfer level: Needs assistance Equipment used: Rolling walker (2 wheels), None Transfers: Sit to/from Stand, Bed to chair/wheelchair/BSC Sit to Stand: Supervision   Step pivot transfers:  Supervision       General transfer comment: no LOB; increase time for pt to self correct for balance.    Ambulation/Gait Ambulation/Gait assistance: Supervision Gait Distance (Feet): 25 Feet Assistive device: Rolling walker (2 wheels) Gait Pattern/deviations: WFL(Within Functional Limits)       General Gait Details: declined further distance 2/2 fatigue.   Stairs             Wheelchair Mobility     Tilt Bed    Modified Rankin (Stroke Patients Only)       Balance Overall balance assessment: Needs assistance Sitting-balance support: Feet supported Sitting balance-Leahy Scale: Good     Standing balance support: No upper extremity supported, Single extremity supported Standing balance-Leahy Scale: Good Standing balance comment: intermittent UE support to dynamic functional standing balance                            Communication Communication Communication: No apparent difficulties  Cognition Arousal: Alert Behavior During Therapy: WFL for tasks assessed/performed   PT - Cognitive impairments: History of cognitive impairments                         Following commands: Intact      Cueing Cueing Techniques: Verbal cues  Exercises      General Comments        Pertinent Vitals/Pain Pain Assessment Pain Assessment: No/denies pain    Home Living                          Prior Function            PT Goals (current goals can now be found in the  care plan section) Acute Rehab PT Goals Patient Stated Goal: to go home PT Goal Formulation: With patient Time For Goal Achievement: 08/16/23 Potential to Achieve Goals: Good Progress towards PT goals: Progressing toward goals    Frequency    Min 1X/week      PT Plan      Co-evaluation              AM-PAC PT "6 Clicks" Mobility   Outcome Measure  Help needed turning from your back to your side while in a flat bed without using bedrails?: None Help  needed moving from lying on your back to sitting on the side of a flat bed without using bedrails?: None Help needed moving to and from a bed to a chair (including a wheelchair)?: A Little Help needed standing up from a chair using your arms (e.g., wheelchair or bedside chair)?: A Little Help needed to walk in hospital room?: A Little Help needed climbing 3-5 steps with a railing? : A Little 6 Click Score: 20    End of Session Equipment Utilized During Treatment: Gait belt Activity Tolerance: Patient tolerated treatment well;Patient limited by fatigue Patient left: in bed;with nursing/sitter in room;with family/visitor present Nurse Communication: Mobility status PT Visit Diagnosis: Other abnormalities of gait and mobility (R26.89);Muscle weakness (generalized) (M62.81)     Time: 1205-1220 PT Time Calculation (min) (ACUTE ONLY): 15 min  Charges:    $Therapeutic Activity: 8-22 mins PT General Charges $$ ACUTE PT VISIT: 1 Visit                     Hortencia Conradi, PTA  08/05/23, 12:38 PM

## 2023-08-06 DIAGNOSIS — J111 Influenza due to unidentified influenza virus with other respiratory manifestations: Secondary | ICD-10-CM | POA: Diagnosis not present

## 2023-08-06 MED ORDER — METOPROLOL TARTRATE 50 MG PO TABS
50.0000 mg | ORAL_TABLET | Freq: Two times a day (BID) | ORAL | Status: DC
  Administered 2023-08-06 – 2023-08-09 (×6): 50 mg via ORAL
  Filled 2023-08-06 (×6): qty 1

## 2023-08-06 MED ORDER — POTASSIUM CHLORIDE CRYS ER 20 MEQ PO TBCR
40.0000 meq | EXTENDED_RELEASE_TABLET | Freq: Once | ORAL | Status: AC
  Administered 2023-08-06: 40 meq via ORAL
  Filled 2023-08-06: qty 2

## 2023-08-06 MED ORDER — METOPROLOL TARTRATE 25 MG PO TABS
25.0000 mg | ORAL_TABLET | Freq: Once | ORAL | Status: AC
  Administered 2023-08-06: 25 mg via ORAL
  Filled 2023-08-06: qty 1

## 2023-08-06 MED ORDER — K PHOS MONO-SOD PHOS DI & MONO 155-852-130 MG PO TABS
500.0000 mg | ORAL_TABLET | Freq: Four times a day (QID) | ORAL | Status: AC
  Administered 2023-08-06 (×3): 500 mg via ORAL
  Filled 2023-08-06 (×3): qty 2

## 2023-08-06 NOTE — Progress Notes (Signed)
 [facility] Progress Note  Patient: Jordan Gordon    WUJ:[id number]  DOA: 08/02/2023     Date of Service: the patient was seen and examined on 08/06/2023  Chief Complaint  Patient presents with   Fall   Shortness of Breath   Brief hospital course:  89 y.o. female with medical history significant of advanced dementia, CAD, HTN, HLD, anxiety/depression, brought in by family member for increasing shortness of breath cough and fall.   Patient started to have wheezing and dry cough yesterday, and " very shaky" did not ambulate, and this morning, patient was found by caregiver on the floor.  Patient could not remember much what has happened.  Denied any headache chest pain, no diarrhea, denied any fever or chills.  Patient had flu vaccination this season.  EMS found patient hypoxic O2 saturation 87% on room air and placed on 2 L.  Blood pressure elevated 200/100.   ED Course: Temperature 98.3, tachycardia, tachypneic blood pressure elevated 160/70, O2 saturation 97% on 2 L.  Chest x-ray negative for acute infiltrates.  Blood work showed WBC 5.2, K2.1, normal kidney function.  Respiratory panel positive for influenza A.   Assessment and Plan:   Principal Problem:   Influenza Active Problems:   Influenza A with pneumonia   # Acute hypoxic respiratory failure secondary to influenza A No evidence of pneumonia on x-ray.  No consolidation 2/27 respiratory failure resolved, currently saturating well on room air Plan: Continue p.o. Tamiflu S/p IV Solu-Medrol, start prednisone 40 mg p.o. daily for 3 days from 2/27 Multimodal bronchodilators Incentive spirometry Avoid IV fluids S/p Lasix 40 mg IV x 1 dose Respiratory isolation Mucinex 600 mg p.o. twice daily, Tussionex PRN   # Sepsis, without acute endorgan damage secondary to influenza A -Sepsis labs advanced tachycardia and tachypneic, and hypoxic, source of infection is influenza  Management as above   # Hypokalemia, potassium  repleted. # Hypophosphatemia, Phos repleted. Monitor electrolytes and replete as needed.   # Fall No residual pain.  No evidence of loss of consciousness.  CT imaging survey reassuring. Therapy evaluations     # HTN Resume home regimen   # Advanced dementia Mentation at baseline.  Resume Aricept  # Vitamin D deficiency: started vitamin D 50,000 units p.o. weekly, follow with PCP to repeat vitamin D level after 3 to 6 months.  # Vitamin B12 deficiency:  B12 level 163, goal >400, Started vitamin B12 1000 mcg IM injection daily during hospital stay, followed by oral supplement.  Follow-up PCP to repeat vitamin B12 level after 3 to 6 months.   Body mass index is 27.88 kg/m.  Interventions:  Diet: Regular diet DVT Prophylaxis: Subcutaneous Lovenox   Advance goals of care discussion: DNR-Limited  Family Communication: family was present at bedside, at the time of interview.  The pt provided permission to discuss medical plan with the family. Opportunity was given to ask question and all questions were answered satisfactorily.   Disposition:  Pt is from Home, admitted with respiratory failure due to influenza A viral infection, still has respiratory failure and hypokalemia, which precludes a safe discharge. Discharge to SNF, when stable, [facility] discharge in 1 to 2 days.  Subjective: No significant events overnight, still has productive cough, shortness of breathing is improving.  No chest pain or palpitations, no any other complaints.  Physical Exam: General: NAD, lying comfortably Appear in no distress, affect appropriate Eyes: PERRLA ENT: Oral Mucosa Clear, moist  Neck: no JVD,  Cardiovascular: S1  and S2 Present, no Murmur,  Respiratory: Equal air entry bilaterally, mild  crackles and occasional wheezes Abdomen: Bowel Sound present, Soft and no tenderness,  Skin: no rashes Extremities: no Pedal edema, no calf tenderness Neurologic: without any new focal findings Gait not  checked due to patient safety concerns  Vitals:   08/06/23 0731 08/06/23 0800 08/06/23 1100 08/06/23 1349  BP:  (!) 180/77 (!) 163/85   Pulse:  99 84   Resp:  18 18   Temp:  98.5 F (36.9 C) 98 F (36.7 C)   TempSrc:  Oral Oral   SpO2: 96% 94% 95% 93%  Weight:      Height:        Intake/Output Summary (Last 24 hours) at 08/06/2023 1406 Last data filed at 08/05/2023 1411 Gross per 24 hour  Intake 0 ml  Output --  Net 0 ml   Filed Weights   08/02/23 1200  Weight: 71.4 kg    Data Reviewed: I have personally reviewed and interpreted daily labs, tele strips, imagings as discussed above. I reviewed all nursing notes, pharmacy notes, vitals, pertinent old records I have discussed plan of care as described above with RN and patient/family.  CBC: Recent Labs  Lab 08/02/23 0733 08/04/23 0929 08/05/23 0507  WBC 5.2 6.5 5.3  NEUTROABS 3.8  --   --   HGB 12.9 12.7 11.8*  HCT 37.1 36.9 34.6*  MCV 84.1 85.8 86.1  PLT 138* 164 134*   Basic Metabolic Panel: Recent Labs  Lab 08/02/23 0733 08/03/23 0258 08/04/23 0929 08/05/23 0507  NA 139 140 141 136  K 3.1* 3.5 2.8* 3.4*  CL 105 109 105 106  CO2 23 25 26 22   GLUCOSE 152* 109* 137* 104*  BUN 12 15 24* 23  CREATININE 0.92 1.09* 1.30* 1.15*  CALCIUM 8.6* 8.5* 8.5* 8.0*  MG  --   --  2.0 2.1  PHOS  --   --  3.2 2.3*    Studies: No results found.  Scheduled Meds:  arformoterol  15 mcg Nebulization BID   aspirin EC  81 mg Oral Daily   budesonide (PULMICORT) nebulizer solution  0.25 mg Nebulization BID   cyanocobalamin  1,000 mcg Intramuscular Q1200   Followed by   Melene Muller ON 08/11/2023] vitamin B-12  1,000 mcg Oral Daily   donepezil  10 mg Oral QHS   enoxaparin (LOVENOX) injection  40 mg Subcutaneous Q24H   guaiFENesin  600 mg Oral BID   ipratropium-albuterol  3 mL Nebulization TID   losartan  100 mg Oral Daily   metoprolol tartrate  50 mg Oral BID   oseltamivir  30 mg Oral BID   oxybutynin  5 mg Oral QHS    pantoprazole  40 mg Oral Daily   phosphorus  500 mg Oral QID   predniSONE  40 mg Oral Q breakfast   sertraline  100 mg Oral Daily   traZODone  150 mg Oral QHS   Vitamin D (Ergocalciferol)  50,000 Units Oral Q7 days   Continuous Infusions:   PRN Meds: ALPRAZolam, chlorpheniramine-HYDROcodone, ipratropium, ondansetron **OR** ondansetron (ZOFRAN) IV, mouth rinse  Time spent: 40 minutes  Author: Gillis Santa. MD [facility] 08/06/2023 2:06 PM  To reach On-call, see care teams to locate the attending and reach out to them via [email or web address]. If 7PM-7AM, please contact night-coverage If you still have difficulty reaching the attending provider, please page the [facility] (Director on Call) for [facility] on amion for assistance.

## 2023-08-06 NOTE — TOC Progression Note (Addendum)
 Transition of Care [facility]) - Progression Note    Patient Details  Name: Jordan Gordon MRN: [id number] Date of Birth: 08/26/1933  Transition of [facility]) CM/SW Contact  Marlowe Sax, RN Phone Number: 08/06/2023, 12:58 PM  Clinical Narrative:     Received a message from [employer] He stated that there is an open claim with [employer] I called son Jordan Gordon and provided him with the information asking him to have [employer] email a letter showing it is closed to [email or web address] Jordan Gordon stated he will call them Update I received notification from [place] stating that he obtained the letter showing the claim was closed and emailed it to the above email address Reached out to [place] to inquire if he received the letter via email They confirmed that they did get it and can accept her Monday   Expected Discharge Plan: Home w Home Health Services Barriers to Discharge: Barriers Resolved  Expected Discharge Plan and Services   Discharge Planning Services: CM Consult   Living arrangements for the past 2 months: Single Family Home                 DME Arranged: N/A         HH Arranged: PT HH Agency: [facility] Date [facility] Agency Contacted: 08/03/23 Time HH Agency Contacted: 1146 Representative spoke with at [facility] Agency: Velna Hatchet   Social Determinants of Health (SDOH) Interventions SDOH Screenings   Food Insecurity: No Food Insecurity (08/02/2023)  Housing: Low Risk  (08/02/2023)  Transportation Needs: No Transportation Needs (08/02/2023)  Utilities: Not At Risk (08/02/2023)  Alcohol Screen: Low Risk  (10/21/2022)  Depression (PHQ2-9): Medium Risk (01/26/2023)  Financial Resource Strain: Low Risk  (08/27/2021)  Physical Activity: Insufficiently Active (10/21/2022)  Social Connections: Moderately Isolated (08/02/2023)  Stress: No Stress Concern Present (10/21/2022)  Tobacco Use: Low Risk  (08/02/2023)    Readmission Risk Interventions     No data to display

## 2023-08-07 DIAGNOSIS — J111 Influenza due to unidentified influenza virus with other respiratory manifestations: Secondary | ICD-10-CM | POA: Diagnosis not present

## 2023-08-07 LAB — CULTURE, BLOOD (ROUTINE X 2)
Culture: NO GROWTH
Culture: NO GROWTH
Special Requests: ADEQUATE

## 2023-08-07 LAB — BASIC METABOLIC PANEL
Anion gap: 8 (ref 5–15)
BUN: 21 mg/dL (ref 8–23)
CO2: 25 mmol/L (ref 22–32)
Calcium: 8.1 mg/dL — ABNORMAL LOW (ref 8.9–10.3)
Chloride: 109 mmol/L (ref 98–111)
Creatinine, Ser: 1.31 mg/dL — ABNORMAL HIGH (ref 0.44–1.00)
GFR, Estimated: 39 mL/min — ABNORMAL LOW (ref >60)
Glucose, Bld: 103 mg/dL — ABNORMAL HIGH (ref 70–99)
Potassium: 3.2 mmol/L — ABNORMAL LOW (ref 3.5–5.1)
Sodium: 142 mmol/L (ref 135–145)

## 2023-08-07 LAB — CBC
HCT: 36.8 % (ref 36.0–46.0)
Hemoglobin: 12.7 g/dL (ref 12.0–15.0)
MCH: 29 pg (ref 26.0–34.0)
MCHC: 34.5 g/dL (ref 30.0–36.0)
MCV: 84 fL (ref 80.0–100.0)
Platelets: 155 10*3/uL (ref 150–400)
RBC: 4.38 MIL/uL (ref 3.87–5.11)
RDW: 12.7 % (ref 11.5–15.5)
WBC: 5.8 10*3/uL (ref 4.0–10.5)
nRBC: 0 % (ref 0.0–0.2)

## 2023-08-07 LAB — PHOSPHORUS: Phosphorus: 3.7 mg/dL (ref 2.5–4.6)

## 2023-08-07 LAB — MAGNESIUM: Magnesium: 1.9 mg/dL (ref 1.7–2.4)

## 2023-08-07 MED ORDER — POTASSIUM CHLORIDE CRYS ER 20 MEQ PO TBCR
40.0000 meq | EXTENDED_RELEASE_TABLET | ORAL | Status: AC
  Administered 2023-08-07 (×2): 40 meq via ORAL
  Filled 2023-08-07 (×2): qty 2

## 2023-08-07 MED ORDER — ENOXAPARIN SODIUM 30 MG/0.3ML IJ SOSY
30.0000 mg | PREFILLED_SYRINGE | INTRAMUSCULAR | Status: DC
  Administered 2023-08-07 – 2023-08-08 (×2): 30 mg via SUBCUTANEOUS
  Filled 2023-08-07 (×2): qty 0.3

## 2023-08-07 NOTE — TOC Progression Note (Signed)
 Transition of Care [facility]) - Progression Note    Patient Details  Name: Jordan Gordon MRN: [id number] Date of Birth: Jun 11, 1933  Transition of Care [facility]) CM/SW Contact  Hetty Ely, RN Phone Number: 08/07/2023, 12:53 PM  Clinical Narrative:  Approved for Ambulance, [facility], [phone]. Not approved for SNF, P2P required, 4pm today, call Dr. Margaretann Loveless, [phone]. MD notified.    Expected Discharge Plan: Home w Home Health Services Barriers to Discharge: Barriers Resolved  Expected Discharge Plan and Services   Discharge Planning Services: CM Consult   Living arrangements for the past 2 months: Single Family Home                 DME Arranged: N/A         HH Arranged: PT HH Agency: [facility] Date HH Agency Contacted: 08/03/23 Time HH Agency Contacted: 1146 Representative spoke with at [facility] Agency: Velna Hatchet   Social Determinants of Health (SDOH) Interventions SDOH Screenings   Food Insecurity: No Food Insecurity (08/02/2023)  Housing: Low Risk  (08/02/2023)  Transportation Needs: No Transportation Needs (08/02/2023)  Utilities: Not At Risk (08/02/2023)  Alcohol Screen: Low Risk  (10/21/2022)  Depression (PHQ2-9): Medium Risk (01/26/2023)  Financial Resource Strain: Low Risk  (08/27/2021)  Physical Activity: Insufficiently Active (10/21/2022)  Social Connections: Moderately Isolated (08/02/2023)  Stress: No Stress Concern Present (10/21/2022)  Tobacco Use: Low Risk  (08/02/2023)    Readmission Risk Interventions     No data to display

## 2023-08-07 NOTE — Plan of Care (Signed)
   Problem: Clinical Measurements: Goal: Respiratory complications will improve Outcome: Progressing Goal: Cardiovascular complication will be avoided Outcome: Progressing   Problem: Activity: Goal: Risk for activity intolerance will decrease Outcome: Progressing   Problem: Nutrition: Goal: Adequate nutrition will be maintained Outcome: Progressing

## 2023-08-07 NOTE — Progress Notes (Signed)
 PHARMACIST - PHYSICIAN COMMUNICATION  CONCERNING:  Enoxaparin (Lovenox) for DVT Prophylaxis    RECOMMENDATION: Patient was prescribed enoxaprin 40mg  q24 hours for VTE prophylaxis.   Filed Weights   08/02/23 1200  Weight: 71.4 kg (157 lb 6.5 oz)    Body mass index is 27.88 kg/m.  Estimated Creatinine Clearance: 28.7 mL/min (A) (by C-G formula based on SCr of 1.31 mg/dL (H)).   Based on [facility] policy patient is candidate for enoxaparin 30mg  every 24 hours based on CrCl <24ml/min   DESCRIPTION: Pharmacy has adjusted enoxaparin dose per [facility] policy.  Patient is now receiving enoxaparin 30 mg every 24 hours    Lowella Bandy, PharmD Clinical Pharmacist  08/07/2023 1:16 PM

## 2023-08-07 NOTE — Progress Notes (Signed)
 [facility] Progress Note  Patient: Jordan Gordon    NFA:[id number]  DOA: 08/02/2023     Date of Service: the patient was seen and examined on 08/07/2023  Chief Complaint  Patient presents with   Fall   Shortness of Breath   Brief hospital course:  89 y.o. female with medical history significant of advanced dementia, CAD, HTN, HLD, anxiety/depression, brought in by family member for increasing shortness of breath cough and fall.   Patient started to have wheezing and dry cough yesterday, and " very shaky" did not ambulate, and this morning, patient was found by caregiver on the floor.  Patient could not remember much what has happened.  Denied any headache chest pain, no diarrhea, denied any fever or chills.  Patient had flu vaccination this season.  EMS found patient hypoxic O2 saturation 87% on room air and placed on 2 L.  Blood pressure elevated 200/100.   ED Course: Temperature 98.3, tachycardia, tachypneic blood pressure elevated 160/70, O2 saturation 97% on 2 L.  Chest x-ray negative for acute infiltrates.  Blood work showed WBC 5.2, K2.1, normal kidney function.  Respiratory panel positive for influenza A.   Assessment and Plan:   Principal Problem:   Influenza Active Problems:   Influenza A with pneumonia   # Acute hypoxic respiratory failure secondary to influenza A No evidence of pneumonia on x-ray.  No consolidation 2/27 respiratory failure resolved, currently saturating well on room air Plan: Continue p.o. Tamiflu S/p IV Solu-Medrol, start prednisone 40 mg p.o. daily for 3 days from 2/27 Multimodal bronchodilators Incentive spirometry Avoid IV fluids S/p Lasix 40 mg IV x 1 dose Respiratory isolation Mucinex 600 mg p.o. twice daily, Tussionex PRN   # Sepsis, without acute endorgan damage secondary to influenza A -Sepsis labs advanced tachycardia and tachypneic, and hypoxic, source of infection is influenza  Management as above   # Hypokalemia, potassium  repleted. # Hypophosphatemia, Phos repleted. Monitor electrolytes and replete as needed.   # Fall No residual pain.  No evidence of loss of consciousness.  CT imaging survey reassuring. Therapy evaluations     # HTN Resume home regimen   # Advanced dementia Mentation at baseline.  Resume Aricept  # Vitamin D deficiency: started vitamin D 50,000 units p.o. weekly, follow with PCP to repeat vitamin D level after 3 to 6 months.  # Vitamin B12 deficiency:  B12 level 163, goal >400, Started vitamin B12 1000 mcg IM injection daily during hospital stay, followed by oral supplement.  Follow-up PCP to repeat vitamin B12 level after 3 to 6 months.   Body mass index is 27.88 kg/m.  Interventions:  Diet: Regular diet DVT Prophylaxis: Subcutaneous Lovenox   Advance goals of care discussion: DNR-Limited  Family Communication: family was present at bedside, at the time of interview.  The pt provided permission to discuss medical plan with the family. Opportunity was given to ask question and all questions were answered satisfactorily.   Disposition:  Pt is from Home, admitted with respiratory failure due to influenza A viral infection, still has respiratory failure and hypokalemia, which precludes a safe discharge. Discharge to TBD, PT/OT rec SNF but denied, peer-to-peer was done on 3/1, unsuccessful, recommended ALF with HH versus LTC with intermittent therapy Updated peer to peer info to [facility], they will contact patient's son and updated DC plan Patient is medically stable to discharge  Subjective: No significant events overnight, breathing is improving, still has productive cough, no any other complaints.  Physical Exam: General: NAD, lying comfortably Appear in no distress, affect appropriate Eyes: PERRLA ENT: Oral Mucosa Clear, moist  Neck: no JVD,  Cardiovascular: S1 and S2 Present, no Murmur,  Respiratory: Equal air entry bilaterally, mild  crackles and occasional  wheezes Abdomen: Bowel Sound present, Soft and no tenderness,  Skin: no rashes Extremities: no Pedal edema, no calf tenderness Neurologic: without any new focal findings Gait not checked due to patient safety concerns  Vitals:   08/06/23 2124 08/07/23 0030 08/07/23 0804 08/07/23 0941  BP:  (!) 144/80  (!) 147/64  Pulse:  77  72  Resp:  16  20  Temp:  98 F (36.7 C)  98.5 F (36.9 C)  TempSrc:    Oral  SpO2: 93% 95% 94% 96%  Weight:      Height:        Intake/Output Summary (Last 24 hours) at 08/07/2023 1323 Last data filed at 08/06/2023 1426 Gross per 24 hour  Intake 240 ml  Output --  Net 240 ml   Filed Weights   08/02/23 1200  Weight: 71.4 kg    Data Reviewed: I have personally reviewed and interpreted daily labs, tele strips, imagings as discussed above. I reviewed all nursing notes, pharmacy notes, vitals, pertinent old records I have discussed plan of care as described above with RN and patient/family.  CBC: Recent Labs  Lab 08/02/23 0733 08/04/23 0929 08/05/23 0507 08/07/23 0930  WBC 5.2 6.5 5.3 5.8  NEUTROABS 3.8  --   --   --   HGB 12.9 12.7 11.8* 12.7  HCT 37.1 36.9 34.6* 36.8  MCV 84.1 85.8 86.1 84.0  PLT 138* 164 134* 155   Basic Metabolic Panel: Recent Labs  Lab 08/02/23 0733 08/03/23 0258 08/04/23 0929 08/05/23 0507 08/07/23 0930  NA 139 140 141 136 142  K 3.1* 3.5 2.8* 3.4* 3.2*  CL 105 109 105 106 109  CO2 23 25 26 22 25   GLUCOSE 152* 109* 137* 104* 103*  BUN 12 15 24* 23 21  CREATININE 0.92 1.09* 1.30* 1.15* 1.31*  CALCIUM 8.6* 8.5* 8.5* 8.0* 8.1*  MG  --   --  2.0 2.1 1.9  PHOS  --   --  3.2 2.3* 3.7    Studies: No results found.  Scheduled Meds:  arformoterol  15 mcg Nebulization BID   aspirin EC  81 mg Oral Daily   budesonide (PULMICORT) nebulizer solution  0.25 mg Nebulization BID   cyanocobalamin  1,000 mcg Intramuscular Q1200   Followed by   Melene Muller ON 08/11/2023] vitamin B-12  1,000 mcg Oral Daily   donepezil  10 mg  Oral QHS   enoxaparin (LOVENOX) injection  30 mg Subcutaneous Q24H   guaiFENesin  600 mg Oral BID   ipratropium-albuterol  3 mL Nebulization TID   losartan  100 mg Oral Daily   metoprolol tartrate  50 mg Oral BID   oxybutynin  5 mg Oral QHS   pantoprazole  40 mg Oral Daily   potassium chloride  40 mEq Oral Q4H   sertraline  100 mg Oral Daily   traZODone  150 mg Oral QHS   Vitamin D (Ergocalciferol)  50,000 Units Oral Q7 days   Continuous Infusions:   PRN Meds: ALPRAZolam, chlorpheniramine-HYDROcodone, ipratropium, ondansetron **OR** ondansetron (ZOFRAN) IV, mouth rinse  Time spent: 55 minutes  Author: Gillis Santa. MD [facility] 08/07/2023 1:23 PM  To reach On-call, see care teams to locate the attending and reach out to them via  [email or web address] If 7PM-7AM, please contact night-coverage If you still have difficulty reaching the attending provider, please page the [facility] (Director on Call) for [facility] on amion for assistance.

## 2023-08-08 DIAGNOSIS — J111 Influenza due to unidentified influenza virus with other respiratory manifestations: Secondary | ICD-10-CM | POA: Diagnosis not present

## 2023-08-08 LAB — CBC
HCT: 34.1 % — ABNORMAL LOW (ref 36.0–46.0)
Hemoglobin: 12 g/dL (ref 12.0–15.0)
MCH: 29.3 pg (ref 26.0–34.0)
MCHC: 35.2 g/dL (ref 30.0–36.0)
MCV: 83.4 fL (ref 80.0–100.0)
Platelets: 152 10*3/uL (ref 150–400)
RBC: 4.09 MIL/uL (ref 3.87–5.11)
RDW: 12.7 % (ref 11.5–15.5)
WBC: 5.6 10*3/uL (ref 4.0–10.5)
nRBC: 0 % (ref 0.0–0.2)

## 2023-08-08 LAB — BASIC METABOLIC PANEL
Anion gap: 7 (ref 5–15)
BUN: 21 mg/dL (ref 8–23)
CO2: 22 mmol/L (ref 22–32)
Calcium: 8.1 mg/dL — ABNORMAL LOW (ref 8.9–10.3)
Chloride: 112 mmol/L — ABNORMAL HIGH (ref 98–111)
Creatinine, Ser: 1.26 mg/dL — ABNORMAL HIGH (ref 0.44–1.00)
GFR, Estimated: 41 mL/min — ABNORMAL LOW (ref >60)
Glucose, Bld: 98 mg/dL (ref 70–99)
Potassium: 3.8 mmol/L (ref 3.5–5.1)
Sodium: 141 mmol/L (ref 135–145)

## 2023-08-08 LAB — PHOSPHORUS: Phosphorus: 3.8 mg/dL (ref 2.5–4.6)

## 2023-08-08 LAB — MAGNESIUM: Magnesium: 2.2 mg/dL (ref 1.7–2.4)

## 2023-08-08 MED ORDER — AMLODIPINE BESYLATE 5 MG PO TABS
5.0000 mg | ORAL_TABLET | Freq: Every day | ORAL | Status: DC
  Administered 2023-08-08 – 2023-08-09 (×2): 5 mg via ORAL
  Filled 2023-08-08 (×2): qty 1

## 2023-08-08 NOTE — Progress Notes (Signed)
 [facility] Progress Note  Patient: Jordan Gordon    ZOX:[id number]  DOA: 08/02/2023     Date of Service: the patient was seen and examined on 08/08/2023  Chief Complaint  Patient presents with   Fall   Shortness of Breath   Brief hospital course:  89 y.o. female with medical history significant of advanced dementia, CAD, HTN, HLD, anxiety/depression, brought in by family member for increasing shortness of breath cough and fall.   Patient started to have wheezing and dry cough yesterday, and " very shaky" did not ambulate, and this morning, patient was found by caregiver on the floor.  Patient could not remember much what has happened.  Denied any headache chest pain, no diarrhea, denied any fever or chills.  Patient had flu vaccination this season.  EMS found patient hypoxic O2 saturation 87% on room air and placed on 2 L.  Blood pressure elevated 200/100.   ED Course: Temperature 98.3, tachycardia, tachypneic blood pressure elevated 160/70, O2 saturation 97% on 2 L.  Chest x-ray negative for acute infiltrates.  Blood work showed WBC 5.2, K2.1, normal kidney function.  Respiratory panel positive for influenza A.   Assessment and Plan:   Principal Problem:   Influenza Active Problems:   Influenza A with pneumonia   # Acute hypoxic respiratory failure secondary to influenza A, Resolved No evidence of pneumonia on x-ray.  No consolidation 2/27 respiratory failure resolved, currently saturating well on room air Plan: S/p Tamiflu x 5 days, completed course S/p IV Solu-Medrol, s/p prednisone 40 mg p.o. daily for 3 days. Multimodal bronchodilators, Incentive spirometry. Avoid IV fluids. S/p Lasix 40 mg IV x 1 dose Respiratory isolation Mucinex 600 mg p.o. twice daily, Tussionex PRN   # Sepsis, without acute endorgan damage secondary to influenza A.  Resolved -Sepsis labs advanced tachycardia and tachypneic, and hypoxic, source of infection is influenza  Managed as above   #  Hypokalemia, potassium repleted. # Hypophosphatemia, Phos repleted. Monitor electrolytes and replete as needed.   # Fall No residual pain.  No evidence of loss of consciousness.  CT imaging survey reassuring. Therapy evaluations     # HTN Continue losartan 100 mg p.o. daily  2/28 started Lopressor 50 mg p.o. twice daily 3/2 started amlodipine 5 mg p.o. daily Monitor BP and titrate medications accordingly   # Advanced dementia Mentation at baseline.  Resume Aricept  # Vitamin D deficiency: started vitamin D 50,000 units p.o. weekly, follow with PCP to repeat vitamin D level after 3 to 6 months.  # Vitamin B12 deficiency:  B12 level 163, goal >400, Started vitamin B12 1000 mcg IM injection daily during hospital stay, followed by oral supplement.  Follow-up PCP to repeat vitamin B12 level after 3 to 6 months.   Body mass index is 27.88 kg/m.  Interventions:  Diet: Regular diet DVT Prophylaxis: Subcutaneous Lovenox   Advance goals of care discussion: DNR-Limited  Family Communication: family was present at bedside, at the time of interview.  The pt provided permission to discuss medical plan with the family. Opportunity was given to ask question and all questions were answered satisfactorily.   Disposition:  Pt is from Home, admitted with respiratory failure due to influenza A viral infection.  Patient has been successfully treated, oxygen weaned off, currently saturating well on room air. Stable to discharge, waiting for placement Discharge TBD, PT/OT rec SNF but she was denied. So, peer-to-peer was done on 3/1, denied for SNF and recommended ALF with HH vs  LTC with intermittent therapy Updated peer to peer info to [facility], they will contact patient's son and updated DC plan Patient is medically stable to discharge  Subjective: No significant events overnight, patient still has productive cough, no significant shortness of breath, denied any chest pain or palpitation, no nasal  complaints.    Physical Exam: General: NAD, lying comfortably Appear in no distress, affect appropriate Eyes: PERRLA ENT: Oral Mucosa Clear, moist  Neck: no JVD,  Cardiovascular: S1 and S2 Present, no Murmur,  Respiratory: Equal air entry bilaterally, mild  crackles and wheezes, improved Abdomen: Bowel Sound present, Soft and no tenderness,  Skin: no rashes Extremities: no Pedal edema, no calf tenderness Neurologic: without any new focal findings Gait not checked due to patient safety concerns  Vitals:   08/07/23 1928 08/08/23 0756 08/08/23 0832 08/08/23 1334  BP:   (!) 171/78   Pulse:   73   Resp:   16   Temp:   (!) 97.5 F (36.4 C)   TempSrc:      SpO2: 94% 93% 92% 94%  Weight:      Height:        Intake/Output Summary (Last 24 hours) at 08/08/2023 1351 Last data filed at 08/08/2023 1100 Gross per 24 hour  Intake 360 ml  Output --  Net 360 ml   Filed Weights   08/02/23 1200  Weight: 71.4 kg    Data Reviewed: I have personally reviewed and interpreted daily labs, tele strips, imagings as discussed above. I reviewed all nursing notes, pharmacy notes, vitals, pertinent old records I have discussed plan of care as described above with RN and patient/family.  CBC: Recent Labs  Lab 08/02/23 0733 08/04/23 0929 08/05/23 0507 08/07/23 0930 08/08/23 0451  WBC 5.2 6.5 5.3 5.8 5.6  NEUTROABS 3.8  --   --   --   --   HGB 12.9 12.7 11.8* 12.7 12.0  HCT 37.1 36.9 34.6* 36.8 34.1*  MCV 84.1 85.8 86.1 84.0 83.4  PLT 138* 164 134* 155 152   Basic Metabolic Panel: Recent Labs  Lab 08/03/23 0258 08/04/23 0929 08/05/23 0507 08/07/23 0930 08/08/23 0451  NA 140 141 136 142 141  K 3.5 2.8* 3.4* 3.2* 3.8  CL 109 105 106 109 112*  CO2 25 26 22 25 22   GLUCOSE 109* 137* 104* 103* 98  BUN 15 24* 23 21 21   CREATININE 1.09* 1.30* 1.15* 1.31* 1.26*  CALCIUM 8.5* 8.5* 8.0* 8.1* 8.1*  MG  --  2.0 2.1 1.9 2.2  PHOS  --  3.2 2.3* 3.7 3.8    Studies: No results found.   Scheduled Meds:  amLODipine  5 mg Oral Daily   arformoterol  15 mcg Nebulization BID   aspirin EC  81 mg Oral Daily   budesonide (PULMICORT) nebulizer solution  0.25 mg Nebulization BID   cyanocobalamin  1,000 mcg Intramuscular Q1200   Followed by   Melene Muller ON 08/11/2023] vitamin B-12  1,000 mcg Oral Daily   donepezil  10 mg Oral QHS   enoxaparin (LOVENOX) injection  30 mg Subcutaneous Q24H   guaiFENesin  600 mg Oral BID   ipratropium-albuterol  3 mL Nebulization TID   losartan  100 mg Oral Daily   metoprolol tartrate  50 mg Oral BID   oxybutynin  5 mg Oral QHS   pantoprazole  40 mg Oral Daily   sertraline  100 mg Oral Daily   traZODone  150 mg Oral QHS   Vitamin D (Ergocalciferol)  50,000 Units Oral Q7 days   Continuous Infusions:   PRN Meds: ALPRAZolam, chlorpheniramine-HYDROcodone, ipratropium, ondansetron **OR** ondansetron (ZOFRAN) IV, mouth rinse  Time spent: 35 minutes  Author: Gillis Santa. MD [facility] 08/08/2023 1:51 PM  To reach On-call, see care teams to locate the attending and reach out to them via [email or web address]. If 7PM-7AM, please contact night-coverage If you still have difficulty reaching the attending provider, please page the [facility] (Director on Call) for [facility] on amion for assistance.

## 2023-08-08 NOTE — Plan of Care (Signed)

## 2023-08-08 NOTE — Plan of Care (Signed)
  Problem: Education: Goal: Knowledge of General Education information will improve Description: Including pain rating scale, medication(s)/side effects and non-pharmacologic comfort measures 08/08/2023 0143 by Dorthula Nettles, RN Outcome: Progressing 08/08/2023 0137 by Dorthula Nettles, RN Outcome: Progressing   Problem: Health Behavior/Discharge Planning: Goal: Ability to manage health-related needs will improve 08/08/2023 0143 by Dorthula Nettles, RN Outcome: Progressing 08/08/2023 0137 by Dorthula Nettles, RN Outcome: Progressing   Problem: Clinical Measurements: Goal: Ability to maintain clinical measurements within normal limits will improve 08/08/2023 0143 by Dorthula Nettles, RN Outcome: Progressing 08/08/2023 0137 by Dorthula Nettles, RN Outcome: Progressing Goal: Will remain free from infection 08/08/2023 0143 by Dorthula Nettles, RN Outcome: Progressing 08/08/2023 0137 by Dorthula Nettles, RN Outcome: Progressing Goal: Diagnostic test results will improve 08/08/2023 0143 by Dorthula Nettles, RN Outcome: Progressing 08/08/2023 0137 by Dorthula Nettles, RN Outcome: Progressing Goal: Respiratory complications will improve 08/08/2023 0143 by Dorthula Nettles, RN Outcome: Progressing 08/08/2023 0137 by Dorthula Nettles, RN Outcome: Progressing Goal: Cardiovascular complication will be avoided 08/08/2023 0143 by Dorthula Nettles, RN Outcome: Progressing 08/08/2023 0137 by Dorthula Nettles, RN Outcome: Progressing   Problem: Activity: Goal: Risk for activity intolerance will decrease 08/08/2023 0143 by Dorthula Nettles, RN Outcome: Progressing 08/08/2023 0137 by Dorthula Nettles, RN Outcome: Progressing   Problem: Nutrition: Goal: Adequate nutrition will be maintained 08/08/2023 0143 by Dorthula Nettles, RN Outcome: Progressing 08/08/2023 0137 by Dorthula Nettles, RN Outcome: Progressing   Problem: Coping: Goal: Level of anxiety will decrease 08/08/2023 0143 by Dorthula Nettles, RN Outcome: Progressing 08/08/2023 0137 by Dorthula Nettles,  RN Outcome: Progressing   Problem: Elimination: Goal: Will not experience complications related to bowel motility 08/08/2023 0143 by Dorthula Nettles, RN Outcome: Progressing 08/08/2023 0137 by Dorthula Nettles, RN Outcome: Progressing Goal: Will not experience complications related to urinary retention 08/08/2023 0143 by Dorthula Nettles, RN Outcome: Progressing 08/08/2023 0137 by Dorthula Nettles, RN Outcome: Progressing   Problem: Pain Managment: Goal: General experience of comfort will improve and/or be controlled 08/08/2023 0143 by Dorthula Nettles, RN Outcome: Progressing 08/08/2023 0137 by Dorthula Nettles, RN Outcome: Progressing   Problem: Safety: Goal: Ability to remain free from injury will improve 08/08/2023 0143 by Dorthula Nettles, RN Outcome: Progressing 08/08/2023 0137 by Dorthula Nettles, RN Outcome: Progressing   Problem: Skin Integrity: Goal: Risk for impaired skin integrity will decrease 08/08/2023 0143 by Dorthula Nettles, RN Outcome: Progressing 08/08/2023 0137 by Dorthula Nettles, RN Outcome: Progressing

## 2023-08-09 ENCOUNTER — Other Ambulatory Visit: Payer: Self-pay

## 2023-08-09 DIAGNOSIS — J111 Influenza due to unidentified influenza virus with other respiratory manifestations: Secondary | ICD-10-CM | POA: Diagnosis not present

## 2023-08-09 MED ORDER — CYANOCOBALAMIN 1000 MCG PO TABS
1000.0000 ug | ORAL_TABLET | Freq: Every day | ORAL | 2 refills | Status: AC
  Filled 2023-08-09: qty 30, 30d supply, fill #0

## 2023-08-09 MED ORDER — METOPROLOL TARTRATE 50 MG PO TABS
50.0000 mg | ORAL_TABLET | Freq: Two times a day (BID) | ORAL | 11 refills | Status: DC
  Filled 2023-08-09: qty 60, 30d supply, fill #0

## 2023-08-09 MED ORDER — FLUTICASONE FUROATE-VILANTEROL 200-25 MCG/ACT IN AEPB
1.0000 | INHALATION_SPRAY | Freq: Every day | RESPIRATORY_TRACT | 2 refills | Status: AC
  Filled 2023-08-09: qty 60, 30d supply, fill #0

## 2023-08-09 MED ORDER — GUAIFENESIN ER 600 MG PO TB12
600.0000 mg | ORAL_TABLET | Freq: Two times a day (BID) | ORAL | 0 refills | Status: AC
  Filled 2023-08-09: qty 14, 7d supply, fill #0

## 2023-08-09 MED ORDER — ROBAFEN DM 20-200 MG/20ML PO LIQD
10.0000 mL | Freq: Three times a day (TID) | ORAL | 0 refills | Status: DC | PRN
  Filled 2023-08-09: qty 118, 1d supply, fill #0

## 2023-08-09 MED ORDER — VITAMIN D (ERGOCALCIFEROL) 1.25 MG (50000 UNIT) PO CAPS
50000.0000 [IU] | ORAL_CAPSULE | ORAL | 0 refills | Status: AC
  Filled 2023-08-09: qty 12, 84d supply, fill #0

## 2023-08-09 NOTE — Plan of Care (Signed)
 Patient without distress. Medicated as per orders. Encouraged patient to call for assistance when needed. Monitored closely.   Problem: Education: Goal: Knowledge of General Education information will improve Description: Including pain rating scale, medication(s)/side effects and non-pharmacologic comfort measures Outcome: Progressing   Problem: Health Behavior/Discharge Planning: Goal: Ability to manage health-related needs will improve Outcome: Progressing   Problem: Clinical Measurements: Goal: Ability to maintain clinical measurements within normal limits will improve Outcome: Progressing Goal: Will remain free from infection Outcome: Progressing Goal: Diagnostic test results will improve Outcome: Progressing Goal: Respiratory complications will improve Outcome: Progressing Goal: Cardiovascular complication will be avoided Outcome: Progressing   Problem: Activity: Goal: Risk for activity intolerance will decrease Outcome: Progressing   Problem: Nutrition: Goal: Adequate nutrition will be maintained Outcome: Progressing   Problem: Coping: Goal: Level of anxiety will decrease Outcome: Progressing   Problem: Elimination: Goal: Will not experience complications related to bowel motility Outcome: Progressing Goal: Will not experience complications related to urinary retention Outcome: Progressing   Problem: Pain Managment: Goal: General experience of comfort will improve and/or be controlled Outcome: Progressing   Problem: Safety: Goal: Ability to remain free from injury will improve Outcome: Progressing   Problem: Skin Integrity: Goal: Risk for impaired skin integrity will decrease Outcome: Progressing

## 2023-08-09 NOTE — Consult Note (Signed)
 [facility] Liaison Note  08/09/2023  Brecklynn Jian Valls 1934/04/23 [id number]  Location: RN Hospital Liaison screened the patient remotely at [facility].  Insurance: [employer]   Rosi Secrist Capelli is a 89 y.o. 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 The patient was screened for readmission hospitalization with noted medium risk score for unplanned readmission risk with 1 IP in 6 months.  The patient was assessed for potential Care Management service needs for post hospital transition for care coordination. Review of patient's electronic medical record reveals patient was admitted with Influenza. Pt recommended [facility]). No anticipated needs to address at this time.  Plan: [facility] Liaison will continue to follow progress and disposition to asess for post hospital community care coordination/management needs.  Referral request for community care coordination: anticipate Transitions of Care Team follow up.   VBCI Care Management/Population Health does not replace or interfere with any arrangements made by the Inpatient Transition of Care team.   For questions contact:   Elliot Cousin, RN, BSN Hospital Liaison [place]   [facility], Population Health Office Hours MTWF  8:00 am-6:00 pm Direct Dial: [phone] mobile [email or web address]

## 2023-08-09 NOTE — TOC Progression Note (Signed)
 Transition of Care [facility]) - Progression Note    Patient Details  Name: Valaree Fresquez Delage MRN: [id number] Date of Birth: 04-Jun-1934  Transition of [facility]) CM/SW Contact  Marlowe Sax, RN Phone Number: 08/09/2023, 1:18 PM  Clinical Narrative:    Met with the patient and her son in the room, I let them know that Ins denied to go to STR  [facility] will accept for [facility] PT and OT, she will need a RW to go home, [employer] to deliver to the bedside   Expected Discharge Plan: Home w Home Health Services Barriers to Discharge: Barriers Resolved  Expected Discharge Plan and Services   Discharge Planning Services: CM Consult   Living arrangements for the past 2 months: Single Family Home                 DME Arranged: N/A         HH Arranged: PT HH Agency: [facility] Date [facility] Agency Contacted: 08/03/23 Time HH Agency Contacted: 1146 Representative spoke with at [facility] Agency: Velna Hatchet   Social Determinants of Health (SDOH) Interventions SDOH Screenings   Food Insecurity: No Food Insecurity (08/02/2023)  Housing: Low Risk  (08/02/2023)  Transportation Needs: No Transportation Needs (08/02/2023)  Utilities: Not At Risk (08/02/2023)  Alcohol Screen: Low Risk  (10/21/2022)  Depression (PHQ2-9): Medium Risk (01/26/2023)  Financial Resource Strain: Low Risk  (08/27/2021)  Physical Activity: Insufficiently Active (10/21/2022)  Social Connections: Moderately Isolated (08/02/2023)  Stress: No Stress Concern Present (10/21/2022)  Tobacco Use: Low Risk  (08/02/2023)    Readmission Risk Interventions     No data to display

## 2023-08-09 NOTE — Progress Notes (Signed)
 Physical Therapy Treatment Patient Details Name: Jordan Gordon MRN: [id number] DOB: Jun 05, 1934 Today's Date: 08/09/2023   History of Present Illness Pt is an 89 y.o. female with medical history significant of advanced dementia, CAD, HTN, HLD, anxiety/depression, brought in by family member for increasing shortness of breath cough and fall, workup for fluA.    PT Comments  Pt is progressing well with mobility performing bed mobility and transfers at ModI, gait with RW at supervision level, and stair negotiation at supervision to CGA.  Upgraded d/c recommendations have been made and discussed with PT.  Continued PT will assist pt towards greater dynamic standing balance, LE strengthening, and activity tolerance to increase pt's safety and independence with functional mobility.   If plan is discharge home, recommend the following: A little help with bathing/dressing/bathroom;Assistance with cooking/housework;Assist for transportation;Help with stairs or ramp for entrance;Direct supervision/assist for medications management;A little help with walking and/or transfers   Can travel by private vehicle     Yes  Equipment Recommendations  Rolling walker (2 wheels);BSC/3in1    Recommendations for Other Services       Precautions / Restrictions Precautions Precautions: Fall Recall of Precautions/Restrictions: Intact Restrictions Weight Bearing Restrictions Per Provider Order: No Other Position/Activity Restrictions: watch 02 and respirations     Mobility  Bed Mobility Overal bed mobility: Modified Independent Bed Mobility: Supine to Sit     Supine to sit: Modified independent (Device/Increase time), Used rails Sit to supine: Modified independent (Device/Increase time), Used rails        Transfers Overall transfer level: Modified independent Equipment used: Rolling walker (2 wheels) Transfers: Sit to/from Stand, Bed to chair/wheelchair/BSC Sit to Stand: Modified independent  (Device/Increase time)   Step pivot transfers: Modified independent (Device/Increase time)       General transfer comment: steady with tranfers    Ambulation/Gait Ambulation/Gait assistance: Supervision Gait Distance (Feet): 125 Feet Assistive device: Rolling walker (2 wheels) Gait Pattern/deviations: WFL(Within Functional Limits)       General Gait Details: SPO2 94%right after ambulation.   Stairs Stairs: Yes Stairs assistance: Contact guard assist, Supervision Stair Management: One rail Left Number of Stairs: 8 General stair comments: supervision to CGA with cues for hand placement for safety.   Wheelchair Mobility     Tilt Bed    Modified Rankin (Stroke Patients Only)       Balance Overall balance assessment: Needs assistance Sitting-balance support: Feet supported Sitting balance-Leahy Scale: Normal       Standing balance-Leahy Scale: Good Standing balance comment: intermittent UE support to dynamic functional standing balance                            Communication Communication Communication: No apparent difficulties Factors Affecting Communication: Hearing impaired  Cognition Arousal: Alert Behavior During Therapy: WFL for tasks assessed/performed   PT - Cognitive impairments: History of cognitive impairments                         Following commands: Intact      Cueing Cueing Techniques: Verbal cues  Exercises      General Comments        Pertinent Vitals/Pain Pain Assessment Pain Assessment: No/denies pain    Home Living                          Prior Function  PT Goals (current goals can now be found in the care plan section) Acute Rehab PT Goals Patient Stated Goal: to go home PT Goal Formulation: With patient Time For Goal Achievement: 08/16/23 Potential to Achieve Goals: Good Progress towards PT goals: Progressing toward goals    Frequency    Min 1X/week      PT  Plan      Co-evaluation              AM-PAC PT "6 Clicks" Mobility   Outcome Measure  Help needed turning from your back to your side while in a flat bed without using bedrails?: None Help needed moving from lying on your back to sitting on the side of a flat bed without using bedrails?: None Help needed moving to and from a bed to a chair (including a wheelchair)?: None Help needed standing up from a chair using your arms (e.g., wheelchair or bedside chair)?: A Little Help needed to walk in hospital room?: A Little Help needed climbing 3-5 steps with a railing? : A Little 6 Click Score: 21    End of Session Equipment Utilized During Treatment: Gait belt Activity Tolerance: Patient tolerated treatment well Patient left: in bed;with bed alarm set;with call bell/phone within reach Nurse Communication: Mobility status PT Visit Diagnosis: Other abnormalities of gait and mobility (R26.89);Muscle weakness (generalized) (M62.81)     Time: 9147-8295 PT Time Calculation (min) (ACUTE ONLY): 18 min  Charges:    $Gait Training: 8-22 mins PT General Charges $$ ACUTE PT VISIT: 1 Visit                     Hortencia Conradi, PTA  08/09/23, 1:34 PM

## 2023-08-09 NOTE — Plan of Care (Signed)

## 2023-08-09 NOTE — Discharge Summary (Signed)
 [facility] Discharge Summary   Patient: Jordan Gordon ZOX:[id number]  PCP: Sherlyn Hay, DO  Date of admission: 08/02/2023   Date of discharge:  08/09/2023     Discharge Diagnoses:  Principal Problem:   Influenza Active Problems:   Influenza A with pneumonia   Admitted From: Home Disposition:  Home with [facility]  Recommendations for Outpatient Follow-up:  Follow-up with PCP in 1 week Repeat vitamin B12 and vitamin D level after 3 to 6 months. Follow up LABS/TEST:  [name]   Follow-up Information     Sherlyn Hay, DO Follow up in 1 week(s).   Specialty: Family Medicine Contact information: [address] [place] [address] [phone]                Diet recommendation: Cardiac diet  Activity: The patient is advised to gradually reintroduce usual activities, as tolerated  Discharge Condition: stable  Code Status: DNR   History of present illness: As per the H and P dictated on admission Hospital Course:  89 y.o. female with medical history significant of advanced dementia, CAD, HTN, HLD, anxiety/depression, brought in by family member for increasing shortness of breath cough and fall.   Patient started to have wheezing and dry cough yesterday, and " very shaky" did not ambulate, and this morning, patient was found by caregiver on the floor.  Patient could not remember much what has happened.  Denied any headache chest pain, no diarrhea, denied any fever or chills.  Patient had flu vaccination this season.  EMS found patient hypoxic O2 saturation 87% on room air and placed on 2 L.  Blood pressure elevated 200/100.   ED Course: Temperature 98.3, tachycardia, tachypneic blood pressure elevated 160/70, O2 saturation 97% on 2 L.  Chest x-ray negative for acute infiltrates.  Blood work showed WBC 5.2, K2.1, normal kidney function.  Respiratory panel positive for influenza A.   Assessment and Plan:    # Acute hypoxic respiratory failure secondary to  influenza A, Resolved. No evidence of pneumonia on x-ray.  No consolidation 2/27 respiratory failure resolved, currently saturating well on room air. S/p Tamiflu x 5 days, completed course S/p IV Solu-Medrol, s/p prednisone 40 mg p.o. daily for 3 days. S/p Multimodal bronchodilators, Incentive spirometry. Avoid IV fluids. S/p Lasix 40 mg IV x 1 dose. Respiratory isolation Mucinex 600 mg p.o. twice daily, Tussionex PRN. Stable to dc today     # Sepsis, without acute endorgan damage secondary to influenza A.  Resolved -Sepsis labs advanced tachycardia and tachypneic, and hypoxic, source of infection is influenza. Managed as above # Hypokalemia, potassium repleted.  Resolved # Hypophosphatemia, Phos repleted.  Resolved # HTN: Continue losartan 100 mg p.o. daily, On 2/28 started Lopressor 50 mg p.o. twice daily. On 3/2 started amlodipine 5 mg p.o. daily, resumed home dose amlodipine 10 mg p.o. daily on discharge.  Patient was advised to monitor BP at home and follow with the PCP to titrate medication accordingly. # Advanced dementia: Resume Aricept # Vitamin D deficiency: started vitamin D 50,000 units p.o. weekly, follow with PCP to repeat vitamin D level after 3 to 6 months. # Vitamin B12 deficiency:  B12 level 163, goal >400, Started vitamin B12 1000 mcg IM injection daily during hospital stay, followed by oral supplement.  Follow-up PCP to repeat vitamin B12 level after 3 to 6 months. # Fall: No residual pain.  No evidence of loss of consciousness.  CT imaging survey reassuring.  PT and OT  eval done, recommended SNF placement but denied by insurance for SNF placement.  Recommended ALF with PT/OT services vs LTC with intermittent therapy.  Rolling walker was arranged, patient was discharged to home with home health arrangement for TOC.  Body mass index is 27.88 kg/m.  Nutrition Interventions:  Patient was seen by physical therapy, who recommended Therapy, SNF placement, denied by insurance so  patient was discharged to home with home health services arranged by TOC. On the day of the discharge the patient's vitals were stable, and no other acute medical condition were reported by patient. the patient was felt safe to be discharge at Home with Home health.  Consultants: None Procedures: None  Discharge Exam: General: Appear in no distress, no Rash; Oral Mucosa Clear, moist. Cardiovascular: S1 and S2 Present, no Murmur, Respiratory: normal respiratory effort, Bilateral Air entry present and no Crackles, no wheezes Abdomen: Bowel Sound present, Soft and no tenderness, no hernia Extremities: no Pedal edema, no calf tenderness Neurology: alert and oriented to time, place, and person affect appropriate.  Filed Weights   08/02/23 1200  Weight: 71.4 kg   Vitals:   08/09/23 0859 08/09/23 1323  BP: (!) 149/65   Pulse: 80   Resp: 16   Temp: 97.7 F (36.5 C)   SpO2: 92% 98%    DISCHARGE MEDICATION: Allergies as of 08/09/2023       Reactions   Codeine    nausea        Medication List     TAKE these medications    ALPRAZolam 0.25 MG tablet Commonly known as: XANAX Take 1 tablet (0.25 mg total) by mouth 3 (three) times daily as needed for anxiety.   amLODipine 10 MG tablet Commonly known as: NORVASC Take 1 tablet (10 mg total) by mouth daily. Skip the dose if systolic BP less than 140 mmHg What changed: additional instructions   aspirin EC 81 MG tablet Take 81 mg by mouth daily. Swallow whole.   cyanocobalamin 1000 MCG tablet Take 1 tablet (1,000 mcg total) by mouth daily. Start taking on: August 11, 2023   donepezil 10 MG tablet Commonly known as: ARICEPT Take 1 tablet (10 mg total) by mouth at bedtime.   fluticasone furoate-vilanterol 200-25 MCG/ACT Aepb Commonly known as: Breo Ellipta Inhale 1 puff into the lungs daily.   guaiFENesin 600 MG 12 hr tablet Commonly known as: MUCINEX Take 1 tablet (600 mg total) by mouth 2 (two) times daily for 7 days.    guaiFENesin-dextromethorphan 100-10 MG/5ML syrup Commonly known as: ROBITUSSIN DM Take 10 mLs by mouth every 8 (eight) hours as needed for cough.   losartan 100 MG tablet Commonly known as: COZAAR Take 1 tablet (100 mg total) by mouth daily.   metoprolol tartrate 50 MG tablet Commonly known as: LOPRESSOR Take 1 tablet (50 mg total) by mouth 2 (two) times daily.   oxybutynin 5 MG 24 hr tablet Commonly known as: DITROPAN-XL Take 1 tablet (5 mg total) by mouth at bedtime.   sertraline 100 MG tablet Commonly known as: ZOLOFT Take 1 tablet (100 mg total) by mouth daily.   traZODone 150 MG tablet Commonly known as: DESYREL Take 1 tablet (150 mg total) by mouth at bedtime.   Vitamin D (Ergocalciferol) 1.25 MG (50000 UNIT) Caps capsule Commonly known as: DRISDOL Take 1 capsule (50,000 Units total) by mouth every 7 (seven) days. Start taking on: August 11, 2023               Durable  Medical Equipment  (From admission, onward)           Start     Ordered   08/09/23 1318  For home use only DME Walker rolling  Once       Question Answer Comment  Walker: With 5 Inch Wheels   Patient needs a walker to treat with the following condition Impaired mobility      08/09/23 1318           Allergies  Allergen Reactions   Codeine     nausea   Discharge Instructions     Call MD for:  difficulty breathing, headache or visual disturbances   Complete by: As directed    Call MD for:  extreme fatigue   Complete by: As directed    Call MD for:  persistant dizziness or light-headedness   Complete by: As directed    Call MD for:  persistant nausea and vomiting   Complete by: As directed    Call MD for:  severe uncontrolled pain   Complete by: As directed    Call MD for:  temperature >100.4   Complete by: As directed    Diet - low sodium heart healthy   Complete by: As directed    Discharge instructions   Complete by: As directed    Follow-up with PCP in 1 week Repeat  vitamin B12 and vitamin D level after 3 to 6 months.   Increase activity slowly   Complete by: As directed    No wound care   Complete by: As directed        The results of significant diagnostics from this hospitalization (including imaging, microbiology, ancillary and laboratory) are listed below for reference.    Significant Diagnostic Studies: CT Head Wo Contrast Result Date: 08/02/2023 CLINICAL DATA:  Provided history: Mental status change, unknown cause. Additional history provided: Fall. EXAM: CT HEAD WITHOUT CONTRAST TECHNIQUE: Contiguous axial images were obtained from the base of the skull through the vertex without intravenous contrast. RADIATION DOSE REDUCTION: This exam was performed according to the departmental dose-optimization program which includes automated exposure control, adjustment of the mA and/or kV according to patient size and/or use of iterative reconstruction technique. COMPARISON:  Brain MRI 02/17/2021. Report from head CT 09/24/1995 (images unavailable). FINDINGS: Brain: Generalized cerebral atrophy. Patchy and ill-defined hypoattenuation within the cerebral white matter, nonspecific but compatible with mild chronic small vessel ischemic disease. There is no acute intracranial hemorrhage. No demarcated cortical infarct. No extra-axial fluid collection. No evidence of an intracranial mass. No midline shift. Vascular: No hyperdense vessel.  Atherosclerotic calcifications. Skull: No calvarial fracture or aggressive osseous lesion. Sinuses/Orbits: No mass or acute finding within the imaged orbits. Prior right ocular lens replacement. Minimal mucosal thickening within the bilateral ethmoid and right sphenoid sinuses. IMPRESSION: 1. No evidence of an acute intracranial abnormality. 2. Mild chronic small vessel ischemic changes within the cerebral white matter. 3. Cerebral atrophy. 4. Minor paranasal sinus mucosal thickening at the imaged levels. Electronically Signed   By: Jackey Loge D.O.   On: 08/02/2023 08:27   DG Chest Port 1 View Result Date: 08/02/2023 CLINICAL DATA:  Sepsis.  Evaluate for abnormality. EXAM: PORTABLE CHEST 1 VIEW COMPARISON:  11/11/2020 FINDINGS: Heart size and mediastinal contours appear normal. Aortic atherosclerosis. Lungs are hypoinflated. Similar, mild bibasilar scarring. No pleural fluid, interstitial edema or airspace disease. Visualized osseous structures appear intact. IMPRESSION: Low lung volumes. No acute findings. Electronically Signed   By: Veronda Prude.D.  On: 08/02/2023 08:07    Microbiology: Recent Results (from the past 240 hours)  Blood Culture (routine x 2)     Status: None   Collection Time: 08/02/23  7:33 AM   Specimen: BLOOD  Result Value Ref Range Status   Specimen Description BLOOD LEFT AC  Final   Special Requests   Final    BOTTLES DRAWN AEROBIC AND ANAEROBIC Blood Culture results Bui not be optimal due to an inadequate volume of blood received in culture bottles   Culture   Final    NO GROWTH 5 DAYS Performed at [facility], [address] [place], [place] [address]    Report Status 08/07/2023 FINAL  Final  Resp panel by RT-PCR (RSV, Flu A&B, Covid) Anterior Nasal Swab     Status: Abnormal   Collection Time: 08/02/23  7:41 AM   Specimen: Anterior Nasal Swab  Result Value Ref Range Status   SARS Coronavirus 2 by RT PCR NEGATIVE NEGATIVE Final    Comment: (NOTE) SARS-CoV-2 target nucleic acids are NOT DETECTED.  The SARS-CoV-2 RNA is generally detectable in upper respiratory specimens during the acute phase of infection. The lowest concentration of SARS-CoV-2 viral copies this assay can detect is 138 copies/mL. A negative result does not preclude SARS-Cov-2 infection and should not be used as the sole basis for treatment or other patient management decisions. A negative result [place] occur with  improper specimen collection/handling, submission of specimen other than nasopharyngeal swab,  presence of viral mutation(s) within the areas targeted by this assay, and inadequate number of viral copies(<138 copies/mL). A negative result must be combined with clinical observations, patient history, and epidemiological information. The expected result is Negative.  Fact Sheet for Patients:  BloggerCourse.com  Fact Sheet for Healthcare Providers:  SeriousBroker.it  This test is no t yet approved or cleared by the [place] FDA and  has been authorized for detection and/or diagnosis of SARS-CoV-2 by FDA under an Emergency Use Authorization (EUA). This EUA will remain  in effect (meaning this test can be used) for the duration of the COVID-19 declaration under Section 564(b)(1) of the Act, 21 U.S.C.section 360bbb-3(b)(1), unless the authorization is terminated  or revoked sooner.       Influenza A by PCR POSITIVE (A) NEGATIVE Final   Influenza B by PCR NEGATIVE NEGATIVE Final    Comment: (NOTE) The Xpert Xpress SARS-CoV-2/FLU/RSV plus assay is intended as an aid in the diagnosis of influenza from Nasopharyngeal swab specimens and should not be used as a sole basis for treatment. Nasal washings and aspirates are unacceptable for Xpert Xpress SARS-CoV-2/FLU/RSV testing.  Fact Sheet for Patients: BloggerCourse.com  Fact Sheet for Healthcare Providers: SeriousBroker.it  This test is not yet approved or cleared by the [place] FDA and has been authorized for detection and/or diagnosis of SARS-CoV-2 by FDA under an Emergency Use Authorization (EUA). This EUA will remain in effect (meaning this test can be used) for the duration of the COVID-19 declaration under Section 564(b)(1) of the Act, 21 U.S.C. section 360bbb-3(b)(1), unless the authorization is terminated or revoked.     Resp Syncytial Virus by PCR NEGATIVE NEGATIVE Final    Comment: (NOTE) Fact Sheet for  Patients: BloggerCourse.com  Fact Sheet for Healthcare Providers: SeriousBroker.it  This test is not yet approved or cleared by the [place] FDA and has been authorized for detection and/or diagnosis of SARS-CoV-2 by FDA under an Emergency Use Authorization (EUA). This EUA will remain in effect (meaning this test  can be used) for the duration of the COVID-19 declaration under Section 564(b)(1) of the Act, 21 U.S.C. section 360bbb-3(b)(1), unless the authorization is terminated or revoked.  Performed at [facility], [address] [place], [place] [address]   Blood Culture (routine x 2)     Status: None   Collection Time: 08/02/23  8:33 AM   Specimen: BLOOD RIGHT ARM  Result Value Ref Range Status   Specimen Description BLOOD RIGHT ARM  Final   Special Requests   Final    BOTTLES DRAWN AEROBIC AND ANAEROBIC Blood Culture adequate volume   Culture   Final    NO GROWTH 5 DAYS Performed at [facility], [address] [place], [place] [address]    Report Status 08/07/2023 FINAL  Final     Labs: CBC: Recent Labs  Lab 08/04/23 0929 08/05/23 0507 08/07/23 0930 08/08/23 0451  WBC 6.5 5.3 5.8 5.6  HGB 12.7 11.8* 12.7 12.0  HCT 36.9 34.6* 36.8 34.1*  MCV 85.8 86.1 84.0 83.4  PLT 164 134* 155 152   Basic Metabolic Panel: Recent Labs  Lab 08/03/23 0258 08/04/23 0929 08/05/23 0507 08/07/23 0930 08/08/23 0451  NA 140 141 136 142 141  K 3.5 2.8* 3.4* 3.2* 3.8  CL 109 105 106 109 112*  CO2 25 26 22 25 22   GLUCOSE 109* 137* 104* 103* 98  BUN 15 24* 23 21 21   CREATININE 1.09* 1.30* 1.15* 1.31* 1.26*  CALCIUM 8.5* 8.5* 8.0* 8.1* 8.1*  MG  --  2.0 2.1 1.9 2.2  PHOS  --  3.2 2.3* 3.7 3.8   Liver Function Tests: No results for input(s): "AST", "ALT", "ALKPHOS", "BILITOT", "PROT", "ALBUMIN" in the last 168 hours. No results for input(s): "LIPASE", "AMYLASE" in the last 168 hours. No results for  input(s): "AMMONIA" in the last 168 hours. Cardiac Enzymes: Recent Labs  Lab 08/03/23 0258 08/04/23 0929  CKTOTAL 681* 324*   BNP (last 3 results) Recent Labs    08/02/23 0733  BNP 394.6*   CBG: No results for input(s): "GLUCAP" in the last 168 hours.  Time spent: 35 minutes  Signed:  Gillis Santa  [facility] 08/09/2023 1:35 PM

## 2023-08-10 ENCOUNTER — Telehealth: Payer: Self-pay

## 2023-08-10 NOTE — Transitions of Care (Post Inpatient/ED Visit) (Signed)
 08/10/2023  Name: Jordan Gordon MRN: [id number] DOB: 1934-04-26  Today's TOC FU Call Status: Today's TOC FU Call Status:: Successful TOC FU Call Completed TOC FU Call Complete Date: 08/10/23 Patient's Name and Date of Birth confirmed.  Transition Care Management Follow-up Telephone Call Date of Discharge: 08/09/23 Discharge Facility: [facility]) Type of Discharge: Inpatient Admission Primary Inpatient Discharge Diagnosis:: hypertension How have you been since you were released from the hospital?: Better Any questions or concerns?: No  Items Reviewed: Did you receive and understand the discharge instructions provided?: Yes Medications obtained,verified, and reconciled?: Yes (Medications Reviewed) Any new allergies since your discharge?: No Dietary orders reviewed?: Yes Do you have support at home?: Yes People in Home: friend(s), child(ren), adult  Medications Reviewed Today: Medications Reviewed Today     Reviewed by Karena Addison, LPN (Licensed Practical Nurse) on 08/10/23 at [phone]  Med List Status: <None>   Medication Order Taking? Sig Documenting Provider Last Dose Status Informant  ALPRAZolam (XANAX) 0.25 MG tablet [id number] No Take 1 tablet (0.25 mg total) by mouth 3 (three) times daily as needed for anxiety. Sherlyn Hay, DO Past Month Active Pharmacy Records, Self           Med Note Roseanne Reno, Janet Berlin   Mon Aug 02, 2023 10:43 AM) PRN  amLODipine (NORVASC) 10 MG tablet [id number]  Take 1 tablet (10 mg total) by mouth daily. Skip the dose if systolic BP less than 140 mmHg Gillis Santa, MD  Active   aspirin EC 81 MG tablet [id number] No Take 81 mg by mouth daily. Swallow whole. [provider] Taking Active Pharmacy Records, Self  cyanocobalamin 1000 MCG tablet [id number]  Take 1 tablet (1,000 mcg total) by mouth daily. Gillis Santa, MD  Active   Dextromethorphan-guaiFENesin (ROBAFEN DM) 20-200 MG/20ML LIQD [id number]  Take 20 mLs by mouth  every 8 (eight) hours as needed. Gillis Santa, MD  Active   donepezil (ARICEPT) 10 MG tablet [id number]  Take 1 tablet (10 mg total) by mouth at bedtime. Sherlyn Hay, DO  Active Pharmacy Records, Self  fluticasone furoate-vilanterol (BREO ELLIPTA) 200-25 MCG/ACT AEPB [id number]  Inhale 1 puff into the lungs daily. Gillis Santa, MD  Active   guaiFENesin (MUCINEX) 600 MG 12 hr tablet [id number]  Take 1 tablet (600 mg total) by mouth 2 (two) times daily for 7 days. Gillis Santa, MD  Active   losartan (COZAAR) 100 MG tablet [id number]  Take 1 tablet (100 mg total) by mouth daily. Sherlyn Hay, DO  Active Pharmacy Records, Self  metoprolol tartrate (LOPRESSOR) 50 MG tablet [id number]  Take 1 tablet (50 mg total) by mouth 2 (two) times daily. Gillis Santa, MD  Active   oxybutynin (DITROPAN-XL) 5 MG 24 hr tablet [id number]  Take 1 tablet (5 mg total) by mouth at bedtime. Sherlyn Hay, DO  Active Pharmacy Records, Self  sertraline (ZOLOFT) 100 MG tablet [id number]  Take 1 tablet (100 mg total) by mouth daily. Sherlyn Hay, DO  Active Pharmacy Records, Self  traZODone (DESYREL) 150 MG tablet [id number]  Take 1 tablet (150 mg total) by mouth at bedtime. Sherlyn Hay, DO  Active Pharmacy Records, Self  Vitamin D, Ergocalciferol, (DRISDOL) 1.25 MG (50000 UNIT) CAPS capsule [id number]  Take 1 capsule (50,000 Units total) by mouth every 7 (seven) days. Gillis Santa, MD  Active             Home Care and Equipment/Supplies: Were Home Health Services Ordered?:  NA Any new equipment or medical supplies ordered?: NA  Functional Questionnaire: Do you need assistance with bathing/showering or dressing?: Yes Do you need assistance with meal preparation?: Yes Do you need assistance with eating?: No Do you have difficulty maintaining continence: No Do you need assistance with getting out of bed/getting out of a chair/moving?: No Do you have difficulty managing or taking your medications?: Yes  Follow  up appointments reviewed: PCP Follow-up appointment confirmed?: No (no avail appt, sent message to staff to schedule) MD Provider Line Number:[phone] Given: No Specialist Hospital Follow-up appointment confirmed?: NA Do you need transportation to your follow-up appointment?: No Do you understand care options if your condition(s) worsen?: Yes-patient verbalized understanding    SIGNATURE Karena Addison, LPN [facility] Nurse Health Advisor Direct Dial [phone]

## 2023-08-11 ENCOUNTER — Inpatient Hospital Stay: Admitting: Family Medicine

## 2023-08-16 ENCOUNTER — Ambulatory Visit (INDEPENDENT_AMBULATORY_CARE_PROVIDER_SITE_OTHER): Admitting: Family Medicine

## 2023-08-16 ENCOUNTER — Encounter: Payer: Self-pay | Admitting: Family Medicine

## 2023-08-16 VITALS — BP 158/63 | HR 57 | Temp 98.4°F | Ht 63.0 in | Wt 150.0 lb

## 2023-08-16 DIAGNOSIS — R052 Subacute cough: Secondary | ICD-10-CM | POA: Diagnosis not present

## 2023-08-16 DIAGNOSIS — F02A0 Dementia in other diseases classified elsewhere, mild, without behavioral disturbance, psychotic disturbance, mood disturbance, and anxiety: Secondary | ICD-10-CM | POA: Diagnosis not present

## 2023-08-16 DIAGNOSIS — Z09 Encounter for follow-up examination after completed treatment for conditions other than malignant neoplasm: Secondary | ICD-10-CM

## 2023-08-16 DIAGNOSIS — N3941 Urge incontinence: Secondary | ICD-10-CM | POA: Diagnosis not present

## 2023-08-16 DIAGNOSIS — G301 Alzheimer's disease with late onset: Secondary | ICD-10-CM

## 2023-08-16 DIAGNOSIS — R5381 Other malaise: Secondary | ICD-10-CM | POA: Diagnosis not present

## 2023-08-16 MED ORDER — OXYBUTYNIN CHLORIDE ER 5 MG PO TB24: 5.0000 mg | ORAL_TABLET | Freq: Every day | ORAL | 3 refills | Status: AC

## 2023-08-16 MED ORDER — ROBAFEN DM 20-200 MG/20ML PO LIQD: 10.0000 mL | Freq: Three times a day (TID) | ORAL | 0 refills | Status: DC | PRN

## 2023-08-16 NOTE — Assessment & Plan Note (Signed)
 Urinary incontinence has improved with medication. The family reports the medication effectively reduces the frequency of incontinence episodes. - Renew prescription for urinary incontinence medication.

## 2023-08-16 NOTE — Progress Notes (Signed)
 Established patient visit   Patient: Jordan Gordon   DOB: 1934/01/30   89 y.o. Female  MRN: [id number] Visit Date: 08/16/2023  Today's healthcare provider: Sherlyn Hay, DO   Chief Complaint  Patient presents with   Hospitalization Follow-up    Patient presents for follow up after hospitalization for influenza with shortness of breath and associated fall.   Family said she was supposed to have PT and OT at home after discharged.    Subjective    HPI The patient is an 89 year old who presents with difficulty in mobility and self-care following a recent hospitalization for flu and pneumonia. She is accompanied by her son, who is involved in her care.  She has been experiencing difficulty with mobility and self-care following a recent hospitalization for sepsis, flu and pneumonia, which also involved a fall that she does not remember. She is having trouble getting around and is resistant to using a walker. Her family reports that she prefers not to use the walker. She is able to move around her house and climb steps with some assistance, but she does not go outside much.  Her family manages her medications, ensuring she takes them as prescribed. She takes approximately nine to ten pills in the morning, with family assistance in medication management.  Since her hospitalization, she has experienced a decrease in appetite, eating small amounts like a biscuit or muffin. Her family encourages her to drink Ensure and Gatorade to maintain hydration. She enjoys Pepsi and occasionally eats a full meal, such as a [employer] burger.  She has some difficulty with fine motor skills, particularly in using utensils, as noted by her family. She experiences some shortness of breath with exertion, but it is not severe. Her family is concerned about her decreased motivation and activity level, as she used to be very active.   She has been taking medication to help with urinary incontinence, which has been  effective. She also uses a cough syrup containing guaifenesin-dextromethorphan as needed for a mild cough.  Her family is involved in her care, with her sons assisting with daily activities and medication administration. Her family has been trying to arrange for physical and occupational therapy at home to help her regain strength and improve her ability to perform daily activities. They had initially sought rehabilitation at a facility, but this was denied, and they are now pursuing home-based therapy. They are awaiting contact from the therapy providers to begin this process.     Medications: Outpatient Medications Prior to Visit  Medication Sig   ALPRAZolam (XANAX) 0.25 MG tablet Take 1 tablet (0.25 mg total) by mouth 3 (three) times daily as needed for anxiety.   amLODipine (NORVASC) 10 MG tablet Take 1 tablet (10 mg total) by mouth daily. Skip the dose if systolic BP less than 140 mmHg   aspirin EC 81 MG tablet Take 81 mg by mouth daily. Swallow whole.   cyanocobalamin 1000 MCG tablet Take 1 tablet (1,000 mcg total) by mouth daily.   donepezil (ARICEPT) 10 MG tablet Take 1 tablet (10 mg total) by mouth at bedtime.   fluticasone furoate-vilanterol (BREO ELLIPTA) 200-25 MCG/ACT AEPB Inhale 1 puff into the lungs daily.   guaiFENesin (MUCINEX) 600 MG 12 hr tablet Take 1 tablet (600 mg total) by mouth 2 (two) times daily for 7 days.   losartan (COZAAR) 100 MG tablet Take 1 tablet (100 mg total) by mouth daily.   metoprolol tartrate (LOPRESSOR) 50 MG tablet  Take 1 tablet (50 mg total) by mouth 2 (two) times daily.   sertraline (ZOLOFT) 100 MG tablet Take 1 tablet (100 mg total) by mouth daily.   traZODone (DESYREL) 150 MG tablet Take 1 tablet (150 mg total) by mouth at bedtime.   Vitamin D, Ergocalciferol, (DRISDOL) 1.25 MG (50000 UNIT) CAPS capsule Take 1 capsule (50,000 Units total) by mouth every 7 (seven) days.   [DISCONTINUED] Dextromethorphan-guaiFENesin (ROBAFEN DM) 20-200 MG/20ML LIQD  Take 20 mLs by mouth every 8 (eight) hours as needed.   [DISCONTINUED] oxybutynin (DITROPAN-XL) 5 MG 24 hr tablet Take 1 tablet (5 mg total) by mouth at bedtime.   No facility-administered medications prior to visit.    Review of Systems  Constitutional:  Negative for appetite change, chills, fatigue and fever.  Respiratory:  Positive for cough (intermitten, improving). Negative for chest tightness and shortness of breath.   Cardiovascular:  Negative for chest pain and palpitations.  Gastrointestinal:  Negative for abdominal pain, nausea and vomiting.  Neurological:  Negative for dizziness and weakness.        Objective    BP (!) 158/63 (BP Location: Left Arm, Patient Position: Sitting, Cuff Size: Normal)   Pulse (!) 57   Temp 98.4 F (36.9 C) (Oral)   Ht 5\' 3"  (1.6 m)   Wt 150 lb (68 kg)   SpO2 99%   BMI 26.57 kg/m     Physical Exam Vitals and nursing note reviewed.  Constitutional:      General: She is awake.     Appearance: Normal appearance.  HENT:     Head: Normocephalic and atraumatic.     Right Ear: Tympanic membrane, ear canal and external ear normal.     Left Ear: Tympanic membrane, ear canal and external ear normal.     Nose: Nose normal.     Mouth/Throat:     Mouth: Mucous membranes are moist.     Pharynx: Oropharynx is clear. No oropharyngeal exudate or posterior oropharyngeal erythema.  Eyes:     General: No scleral icterus.    Extraocular Movements: Extraocular movements intact.     Conjunctiva/sclera: Conjunctivae normal.     Pupils: Pupils are equal, round, and reactive to light.  Neck:     Thyroid: No thyromegaly or thyroid tenderness.  Cardiovascular:     Rate and Rhythm: Normal rate and regular rhythm.     Pulses: Normal pulses.     Heart sounds: Normal heart sounds.  Pulmonary:     Effort: Pulmonary effort is normal. No tachypnea, bradypnea or respiratory distress.     Breath sounds: Normal breath sounds. No stridor. No wheezing, rhonchi or  rales.  Musculoskeletal:     Cervical back: Normal range of motion and neck supple.     Right lower leg: No edema.     Left lower leg: No edema.  Lymphadenopathy:     Cervical: No cervical adenopathy.  Skin:    General: Skin is warm and dry.  Neurological:     Mental Status: She is alert and oriented to person, place, and time. Mental status is at baseline.  Psychiatric:        Mood and Affect: Mood normal.        Behavior: Behavior normal.      No results found for any visits on 08/16/23.  Assessment & Plan    Hospital discharge follow-up Assessment & Plan: An 89 year old female experienced deconditioning following hospitalization for sepsis secondary to influenza and pneumonia, leading to a  fall. She has difficulty with mobility and fine motor skills, such as feeding herself, and a decrease in motivation and activity level. The family seeks physical and occupational therapy to help her regain strength and improve functional abilities. The appeal for admission to a rehabilitation facility was denied. Home health services are expected to provide therapy twice a week to address these issues, based on expectations set for them prior to hospital discharge. - Order physical therapy and occupational therapy for home health to assist with strength and fine motor skills. - Ensure follow-up with home health services to confirm initiation of therapy.  Orders: -     Ambulatory referral to Home Health  Subacute cough -     Robafen DM; Take 20 mLs by mouth every 8 (eight) hours as needed.  Dispense: 118 mL; Refill: 0  Mild late onset Alzheimer's dementia without behavioral disturbance, psychotic disturbance, mood disturbance, or anxiety (HCC) Assessment & Plan: Does well overall. Lives at home alone with frequent visits/care from her sons.  Medication managed by her sons.  Orders: -     Ambulatory referral to Home Health  Physical deconditioning -     Ambulatory referral to Home  Health  Urge incontinence of urine Assessment & Plan: Urinary incontinence has improved with medication. The family reports the medication effectively reduces the frequency of incontinence episodes. - Renew prescription for urinary incontinence medication.  Orders: -     oxyBUTYnin Chloride ER; Take 1 tablet (5 mg total) by mouth at bedtime.  Dispense: 90 tablet; Refill: 3    Nutritional supplementation She has a decreased appetite and is encouraged to consume nutritional supplements like Ensure daily. Vitamin D and B12 supplements are recommended following hospitalization. - Ensure daily intake of Ensure nutritional supplement. - Administer vitamin D weekly as prescribed. - Administer vitamin B12 supplementation as recommended.   Return in about 5 months (around 01/05/2024), or if symptoms worsen or fail to improve, for chronic f/u.      I discussed the assessment and treatment plan with the patient  The patient was provided an opportunity to ask questions and all were answered. The patient agreed with the plan and demonstrated an understanding of the instructions.   The patient was advised to call back or seek an in-person evaluation if the symptoms worsen or if the condition fails to improve as anticipated.    Sherlyn Hay, DO  [facility] [phone] (phone) [phone] (fax)  [facility]

## 2023-08-16 NOTE — Patient Instructions (Signed)
 Start vitamin B12 1000 mcg daily Be sure you are taking vitamin D 50,000 units daily (ergocalciferol).

## 2023-08-16 NOTE — Assessment & Plan Note (Signed)
 Does well overall. Lives at home alone with frequent visits/care from her sons.  Medication managed by her sons.

## 2023-08-16 NOTE — Assessment & Plan Note (Signed)
 An 89 year old female experienced deconditioning following hospitalization for sepsis secondary to influenza and pneumonia, leading to a fall. She has difficulty with mobility and fine motor skills, such as feeding herself, and a decrease in motivation and activity level. The family seeks physical and occupational therapy to help her regain strength and improve functional abilities. The appeal for admission to a rehabilitation facility was denied. Home health services are expected to provide therapy twice a week to address these issues, based on expectations set for them prior to hospital discharge. - Order physical therapy and occupational therapy for home health to assist with strength and fine motor skills. - Ensure follow-up with home health services to confirm initiation of therapy.

## 2023-08-17 ENCOUNTER — Telehealth: Payer: Self-pay

## 2023-08-17 DIAGNOSIS — F02A4 Dementia in other diseases classified elsewhere, mild, with anxiety: Secondary | ICD-10-CM | POA: Diagnosis not present

## 2023-08-17 DIAGNOSIS — J101 Influenza due to other identified influenza virus with other respiratory manifestations: Secondary | ICD-10-CM | POA: Diagnosis not present

## 2023-08-17 DIAGNOSIS — J189 Pneumonia, unspecified organism: Secondary | ICD-10-CM | POA: Diagnosis not present

## 2023-08-17 DIAGNOSIS — E538 Deficiency of other specified B group vitamins: Secondary | ICD-10-CM | POA: Diagnosis not present

## 2023-08-17 DIAGNOSIS — F02A3 Dementia in other diseases classified elsewhere, mild, with mood disturbance: Secondary | ICD-10-CM | POA: Diagnosis not present

## 2023-08-17 DIAGNOSIS — G4700 Insomnia, unspecified: Secondary | ICD-10-CM | POA: Diagnosis not present

## 2023-08-17 DIAGNOSIS — E559 Vitamin D deficiency, unspecified: Secondary | ICD-10-CM | POA: Diagnosis not present

## 2023-08-17 DIAGNOSIS — G301 Alzheimer's disease with late onset: Secondary | ICD-10-CM | POA: Diagnosis not present

## 2023-08-17 DIAGNOSIS — I10 Essential (primary) hypertension: Secondary | ICD-10-CM | POA: Diagnosis not present

## 2023-08-17 DIAGNOSIS — F329 Major depressive disorder, single episode, unspecified: Secondary | ICD-10-CM | POA: Diagnosis not present

## 2023-08-17 NOTE — Telephone Encounter (Unsigned)
 Copied from CRM [id number]. Topic: Clinical - Home Health Verbal Orders >> Aug 17, 2023 11:02 AM Izetta Dakin wrote: Caller/Agency: Elmer Bales, [facility]. Ok to leave VM.  Callback Number: [phone] Service Requested: Physical Therapy, Occupational Therapy(Assessment) Frequency: 2x2 and  1x1 Any new concerns about the patient? No

## 2023-08-19 NOTE — Telephone Encounter (Signed)
 I spoke to [place] and gave her verbal authorization.

## 2023-08-23 ENCOUNTER — Telehealth: Payer: Self-pay | Admitting: Family Medicine

## 2023-08-23 NOTE — Telephone Encounter (Signed)
 [facility] sent in [facility] certification 3/17, front desk portion completed and placed in provider mail box

## 2023-08-26 DIAGNOSIS — J189 Pneumonia, unspecified organism: Secondary | ICD-10-CM

## 2023-08-26 DIAGNOSIS — E538 Deficiency of other specified B group vitamins: Secondary | ICD-10-CM

## 2023-08-26 DIAGNOSIS — F329 Major depressive disorder, single episode, unspecified: Secondary | ICD-10-CM

## 2023-08-26 DIAGNOSIS — I2510 Atherosclerotic heart disease of native coronary artery without angina pectoris: Secondary | ICD-10-CM

## 2023-08-26 DIAGNOSIS — F02A3 Dementia in other diseases classified elsewhere, mild, with mood disturbance: Secondary | ICD-10-CM

## 2023-08-26 DIAGNOSIS — E559 Vitamin D deficiency, unspecified: Secondary | ICD-10-CM

## 2023-08-26 DIAGNOSIS — K219 Gastro-esophageal reflux disease without esophagitis: Secondary | ICD-10-CM

## 2023-08-26 DIAGNOSIS — J101 Influenza due to other identified influenza virus with other respiratory manifestations: Secondary | ICD-10-CM

## 2023-08-26 DIAGNOSIS — G4700 Insomnia, unspecified: Secondary | ICD-10-CM

## 2023-08-26 DIAGNOSIS — I10 Essential (primary) hypertension: Secondary | ICD-10-CM

## 2023-08-26 DIAGNOSIS — G301 Alzheimer's disease with late onset: Secondary | ICD-10-CM

## 2023-08-26 DIAGNOSIS — F02A4 Dementia in other diseases classified elsewhere, mild, with anxiety: Secondary | ICD-10-CM

## 2023-08-27 ENCOUNTER — Ambulatory Visit: Payer: Self-pay

## 2023-08-27 NOTE — Telephone Encounter (Signed)
 Copied from CRM [id number]. Topic: Clinical - Red Word Triage >> Aug 27, 2023 10:05 AM Payton Doughty wrote: Red Word that prompted transfer to Nurse Triage: will w/ [facility] calling to report bp  reading 178/78  Chief Complaint: HTN Symptoms: 178/78 Frequency: today x1 Pertinent Negatives: Patient denies n/v, headache Disposition: [] ED /[] Urgent Care (no appt availability in office) / [] Appointment(In office/virtual)/ []  [facility]/ [] Home Care/ [] Refused Recommended Disposition /[] [place] [facility]/ [x]  Follow-up with PCP Additional Notes: Will w/ [facility] called and reported the following v/s BP 178/78 HR 50 O2-96: stated pt not having any s/s of HTN then the call was disconnected.  I called the number on the chart back and pt's son answered the phone stated pt does not take BP on a regular basis however usually when they check it the BP runs around this level.  Son stated pt has not missed any medications and take BP med this morning around 0820: stated no s/s of high BP.  Son wanted to know if PCP was going to change pt medication or leave it the same or if they need to schedule an appointment. I informed son that I would route information to PCP and request a call back. Reason for Disposition  [1] Systolic BP  >= 130 OR Diastolic >= 80 AND [2] taking BP medications    Routing to PCP to determine if/when patient needs to come in related to BP: if medication change needed.  Answer Assessment - Initial Assessment Questions 1. BLOOD PRESSURE: "What is the blood pressure?" "Did you take at least two measurements 5 minutes apart?"     178/78 2. ONSET: "When did you take your blood pressure?"     today 3. HOW: "How did you take your blood pressure?" (e.g., automatic home BP monitor, visiting nurse)     automatic 4. HISTORY: "Do you have a history of high blood pressure?"     yes 5. MEDICINES: "Are you taking any medicines for blood pressure?" "Have you missed any doses  recently?"     no 6. OTHER SYMPTOMS: "Do you have any symptoms?" (e.g., blurred vision, chest pain, difficulty breathing, headache, weakness)     no 7. PREGNANCY: "Is there any chance you are pregnant?" "When was your last menstrual period?"     N/a  Protocols used: Blood Pressure - High-A-AH

## 2023-08-30 NOTE — Telephone Encounter (Signed)
 Spoke with patient son Jordan Gordon and he verbalized understanding. Requested information be sent via email. Advised we are unable to email but I can send information via patient mychart and he verbalized understanding. Agreed to have information sent via mychart.

## 2023-08-30 NOTE — Telephone Encounter (Signed)
 LVMTCB. Ok to clarify per Dr.Pardue

## 2023-08-31 ENCOUNTER — Ambulatory Visit: Payer: Self-pay

## 2023-08-31 NOTE — Telephone Encounter (Signed)
 Copied from CRM [id number]. Topic: Clinical - Red Word Triage >> Aug 31, 2023 10:17 AM Shelah Lewandowsky wrote: Red Word that prompted transfer to Nurse Triage: high blood pressure 180/72 after meds at 8 am    Chief Complaint: [facility] with [facility] reports BP 180/72 today. Pt. Taking medications as prescribed. BP elevated last week and reported to PCP.  Symptoms: No symptoms Frequency: Today Pertinent Negatives: Patient denies symptoms Disposition: [] ED /[] Urgent Care (no appt availability in office) / [] Appointment(In office/virtual)/ []  [place] [facility]/ [] Home Care/ [x] Refused Recommended Disposition /[] [place] [facility]/ []  Follow-up with PCP Additional Notes: Please advise pt.' Son.   Reason for Disposition  Systolic BP  >= 180 OR Diastolic >= 110  Answer Assessment - Initial Assessment Questions 1. BLOOD PRESSURE: "What is the blood pressure?" "Did you take at least two measurements 5 minutes apart?"     180/72 2. ONSET: "When did you take your blood pressure?"     Today 3. HOW: "How did you take your blood pressure?" (e.g., automatic home BP monitor, visiting nurse)     [facility] nurse 4. HISTORY: "Do you have a history of high blood pressure?"     Yes 5. MEDICINES: "Are you taking any medicines for blood pressure?" "Have you missed any doses recently?"     Yes 6. OTHER SYMPTOMS: "Do you have any symptoms?" (e.g., blurred vision, chest pain, difficulty breathing, headache, weakness)     No 7. PREGNANCY: "Is there any chance you are pregnant?" "When was your last menstrual period?"     No  Protocols used: Blood Pressure - High-A-AH

## 2023-09-01 ENCOUNTER — Other Ambulatory Visit: Payer: Self-pay

## 2023-09-02 NOTE — Telephone Encounter (Signed)
 Called and spoke to the pt Son's listed in the emergency contact, they stated the pt is taking  2 of the medications (metoprolol/losartan) daily and was advised to skip a dose (amlodipine) if the systolic Bp is less than 140. Please advise

## 2023-10-26 ENCOUNTER — Ambulatory Visit: Payer: Self-pay

## 2023-10-26 DIAGNOSIS — Z0000 Encounter for general adult medical examination without abnormal findings: Secondary | ICD-10-CM | POA: Diagnosis not present

## 2023-10-26 NOTE — Patient Instructions (Signed)
 Jordan Gordon , Thank you for taking time out of your busy schedule to complete your Annual Wellness Visit with me. I enjoyed our conversation and look forward to speaking with you again next year. I, as well as your care team,  appreciate your ongoing commitment to your health goals. Please review the following plan we discussed and let me know if I can assist you in the future.  Follow up Visits: Next Medicare AWV with our clinical staff: 11/01/24 @ 1:10 PM BY PHONE   Have you seen your provider in the last 6 months (3 months if uncontrolled diabetes)? Yes   Clinician Recommendations:  Aim for 30 minutes of exercise or brisk walking, 6-8 glasses of water, and 5 servings of fruits and vegetables each day. TAKE CARE!      This is a list of the screening recommended for you and due dates:  Health Maintenance  Topic Date Due   Colon Cancer Screening  09/19/2023   COVID-19 Vaccine (2 - 2024-25 season) 02/07/2024*   Zoster (Shingles) Vaccine (1 of 2) 07/06/2024*   DEXA scan (bone density measurement)  06/08/2026*   Flu Shot  01/07/2024   Medicare Annual Wellness Visit  10/25/2024   DTaP/Tdap/Td vaccine (3 - Td or Tdap) 03/30/2028   Pneumonia Vaccine  Completed   HPV Vaccine  Aged Out   Meningitis B Vaccine  Aged Out  *Topic was postponed. The date shown is not the original due date.    Advanced directives: (ACP Link)Information on Advanced Care Planning can be found at [place]  Secretary of [facility] Advance Health Care Directives Advance Health Care Directives. [email or web address]  Advance Care Planning is important because it:  [x]  Makes sure you receive the medical care that is consistent with your values, goals, and preferences  [x]  It provides guidance to your family and loved ones and reduces their decisional burden about whether or not they are making the right decisions based on your wishes.  Follow the link provided in your after visit summary or read over the paperwork we have mailed to you  to help you started getting your Advance Directives in place. If you need assistance in completing these, please reach out to us  so that we can help you!

## 2023-10-26 NOTE — Progress Notes (Signed)
 Subjective:   Jordan Gordon is a 89 y.o. female who presents for Medicare Annual (Subsequent) preventive examination.  Visit Complete: Virtual I connected with  Jordan Gordon on 10/26/23 by a audio enabled telemedicine application and verified that I am speaking with the correct person using two identifiers. Spoke w/ son, Jordan Gordon  Patient Location: Home  Provider Location: Office/Clinic  I discussed the limitations of evaluation and management by telemedicine. The patient expressed understanding and agreed to proceed.  Vital Signs: Because this visit was a virtual/telehealth visit, some criteria [place] be missing or patient reported. Any vitals not documented were not able to be obtained and vitals that have been documented are patient reported.  Cardiac Risk Factors include: advanced age (>55men, >60 women);hypertension;sedentary lifestyle     Objective:    There were no vitals filed for this visit. There is no height or weight on file to calculate BMI.     10/26/2023   11:43 AM 08/02/2023   10:00 AM 08/02/2023    7:28 AM 10/21/2022   11:32 AM 08/27/2021    1:41 PM 11/11/2020    7:48 AM 10/28/2020    8:35 AM  Advanced Directives  Does Patient Have a Medical Advance Directive? No Yes Yes Yes No No No  Type of Special educational needs teacher of [employer] Power of Platinum;Living will     Does patient want to make changes to medical advance directive?  No - Patient declined     No - Patient declined  Copy of Healthcare Power of Attorney in Chart?  No - copy requested       Would patient like information on creating a medical advance directive? No - Patient declined    No - Patient declined      Current Medications (verified) Outpatient Encounter Medications as of 10/26/2023  Medication Sig   ALPRAZolam  (XANAX ) 0.25 MG tablet Take 1 tablet (0.25 mg total) by mouth 3 (three) times daily as needed for anxiety.   amLODipine  (NORVASC ) 10 MG tablet Take 1 tablet (10 mg total) by  mouth daily. Skip the dose if systolic BP less than 140 mmHg   aspirin  EC 81 MG tablet Take 81 mg by mouth daily. Swallow whole.   cyanocobalamin  1000 MCG tablet Take 1 tablet (1,000 mcg total) by mouth daily.   donepezil  (ARICEPT ) 10 MG tablet Take 1 tablet (10 mg total) by mouth at bedtime.   fluticasone  furoate-vilanterol (BREO ELLIPTA ) 200-25 MCG/ACT AEPB Inhale 1 puff into the lungs daily.   losartan  (COZAAR ) 100 MG tablet Take 1 tablet (100 mg total) by mouth daily.   metoprolol  tartrate (LOPRESSOR ) 50 MG tablet Take 1 tablet (50 mg total) by mouth 2 (two) times daily.   oxybutynin  (DITROPAN -XL) 5 MG 24 hr tablet Take 1 tablet (5 mg total) by mouth at bedtime.   sertraline  (ZOLOFT ) 100 MG tablet Take 1 tablet (100 mg total) by mouth daily.   traZODone  (DESYREL ) 150 MG tablet Take 1 tablet (150 mg total) by mouth at bedtime.   Vitamin D , Ergocalciferol , (DRISDOL ) 1.25 MG (50000 UNIT) CAPS capsule Take 1 capsule (50,000 Units total) by mouth every 7 (seven) days.   Dextromethorphan -guaiFENesin  (ROBAFEN DM) 20-200 MG/20ML LIQD Take 20 mLs by mouth every 8 (eight) hours as needed. (Patient not taking: Reported on 10/26/2023)   No facility-administered encounter medications on file as of 10/26/2023.    Allergies (verified) Codeine   History: Past Medical History:  Diagnosis Date   Anxiety  Coronary artery disease    GERD (gastroesophageal reflux disease)    HOH (hard of hearing)    Hyperlipidemia    Hypertension    Major depressive disorder    Past Surgical History:  Procedure Laterality Date   ABDOMINAL HYSTERECTOMY  1979   BREAST EXCISIONAL BIOPSY Left 2005   benign   CATARACT EXTRACTION W/PHACO Left 02/08/2018   Procedure: CATARACT EXTRACTION PHACO AND INTRAOCULAR LENS PLACEMENT (IOC);  Surgeon: Clair Crews, MD;  Location: [facility] ORS;  Service: Ophthalmology;  Laterality: Left;  US  00:33.4 AP% 13.9 CDE 4.66 Fluid Pack lot # [id number]   COLONOSCOPY WITH PROPOFOL  N/A  07/22/2015   Procedure: COLONOSCOPY WITH PROPOFOL ;  Surgeon: Cassie Click, MD;  Location: [facility] ENDOSCOPY;  Service: Endoscopy;  Laterality: N/A;   COLONOSCOPY WITH PROPOFOL  N/A 10/19/2016   Procedure: COLONOSCOPY WITH PROPOFOL ;  Surgeon: Cassie Click, MD;  Location: [facility] ENDOSCOPY;  Service: Endoscopy;  Laterality: N/A;   COLONOSCOPY WITH PROPOFOL  N/A 09/18/2020   Procedure: COLONOSCOPY WITH PROPOFOL ;  Surgeon: Marshall Skeeter, MD;  Location: [facility] ENDOSCOPY;  Service: Endoscopy;  Laterality: N/A;   ESOPHAGOGASTRODUODENOSCOPY (EGD) WITH PROPOFOL  N/A 09/18/2020   Procedure: ESOPHAGOGASTRODUODENOSCOPY (EGD) WITH PROPOFOL ;  Surgeon: Marshall Skeeter, MD;  Location: [facility] ENDOSCOPY;  Service: Endoscopy;  Laterality: N/A;   JOINT REPLACEMENT     TKR   KNEE SURGERY Left 2011   2007   TONSILLECTOMY  1940   ADENOIDS   Family History  Problem Relation Age of Onset   Cancer Mother    Heart disease Father    Cancer Brother    Breast cancer Neg Hx    Social History   Socioeconomic History   Marital status: Widowed    Spouse name: Not on file   Number of children: 2   Years of education: Not on file   Highest education level: High school graduate  Occupational History   Occupation: retired  Tobacco Use   Smoking status: Never   Smokeless tobacco: Never  Vaping Use   Vaping status: Never Used  Substance and Sexual Activity   Alcohol use: Yes    Alcohol/week: 0.0 standard drinks of alcohol    Comment: wine once every 2 weeks   Drug use: No   Sexual activity: Not on file  Other Topics Concern   Not on file  Social History Narrative   Lives alone   Right handed   Drinks 1 cup caffeine daily   Social Drivers of Health   Financial Resource Strain: Low Risk  (10/26/2023)   Overall Financial Resource Strain (CARDIA)    Difficulty of Paying Living Expenses: Not hard at all  Food Insecurity: No Food Insecurity (10/26/2023)   Hunger Vital Sign    Worried About Running Out of  Food in the Last Year: Never true    Ran Out of Food in the Last Year: Never true  Transportation Needs: No Transportation Needs (10/26/2023)   PRAPARE - Administrator, Civil Service (Medical): No    Lack of Transportation (Non-Medical): No  Physical Activity: Insufficiently Active (10/26/2023)   Exercise Vital Sign    Days of Exercise per Week: 2 days    Minutes of Exercise per Session: 20 min  Stress: No Stress Concern Present (10/26/2023)   [employer] of Occupational Health - Occupational Stress Questionnaire    Feeling of Stress : Not at all  Social Connections: Moderately Isolated (10/26/2023)   Social Connection and Isolation Panel [NHANES]    Frequency  of Communication with Friends and Family: More than three times a week    Frequency of Social Gatherings with Friends and Family: Twice a week    Attends Religious Services: More than 4 times per year    Active Member of [employer] or Organizations: No    Attends Banker Meetings: Never    Marital Status: Widowed    Tobacco Counseling Counseling given: Not Answered   Clinical Intake:  Pre-visit preparation completed: Yes  Pain : No/denies pain     BMI - recorded: 26.6 Nutritional Status: BMI 25 -29 Overweight Nutritional Risks: None Diabetes: No  How often do you need to have someone help you when you read instructions, pamphlets, or other written materials from your doctor or pharmacy?: 1 - Never  Interpreter Needed?: No  Information entered by :: Dellie Fergusson, LPN   Activities of Daily Living    10/26/2023   11:44 AM 08/02/2023    1:00 PM  In your present state of health, do you have any difficulty performing the following activities:  Hearing? 1 1  Vision? 1 1  Difficulty concentrating or making decisions? 1 0  Walking or climbing stairs? 0   Dressing or bathing? 1   Doing errands, shopping? 1   Preparing Food and eating ? Y   Using the Toilet? N   In the past six months, have  you accidently leaked urine? Y   Do you have problems with loss of bowel control? N   Managing your Medications? Y   Managing your Finances? Y   Housekeeping or managing your Housekeeping? Y     Patient Care Team: Carlean Charter, DO as PCP - General (Family Medicine) Clair Crews, MD as Referring Physician (Ophthalmology) Dingeldein, Landon Pinion, MD (Ophthalmology)  Indicate any recent Medical Services you [place] have received from other than [facility] providers in the past year (date Mccall be approximate).     Assessment:   This is a routine wellness examination for Jimesha.  Hearing/Vision screen Hearing Screening - Comments:: WEARS AIDS, BOTH EARS Vision Screening - Comments:: WEARS GLASSES BUT HAS POOR VISION- MACULAR DEGENERATION- DR.DINGELDEIN   Goals Addressed             This Visit's Progress    DIET - INCREASE WATER INTAKE        Depression Screen    10/26/2023   11:41 AM 07/07/2023    1:15 PM 01/26/2023    1:59 PM 10/21/2022   11:24 AM 06/29/2022    2:31 PM 08/27/2021    1:40 PM 04/21/2021    9:25 AM  PHQ 2/9 Scores  PHQ - 2 Score 2  2 2 2  0 1  PHQ- 9 Score 3  10 4 6  3   Exception Documentation  Medical reason         Fall Risk    10/26/2023   11:44 AM 01/26/2023    1:59 PM 10/21/2022   11:23 AM 06/29/2022    2:31 PM 08/27/2021    1:42 PM  Fall Risk   Falls in the past year? 0 0 0 0 0  Number falls in past yr: 0  0 0 0  Injury with Fall? 0 0 0 0 0  Risk for fall due to : No Fall Risks No Fall Risks No Fall Risks  No Fall Risks  Follow up Falls evaluation completed Falls evaluation completed Falls prevention discussed;Education provided  Falls evaluation completed    MEDICARE RISK AT HOME: Medicare Risk at  Home Any stairs in or around the home?: Yes If so, are there any without handrails?: No Home free of loose throw rugs in walkways, pet beds, electrical cords, etc?: Yes Adequate lighting in your home to reduce risk of falls?: Yes Life alert?: No Use of a  cane, walker or w/c?: No Grab bars in the bathroom?: Yes Shower chair or bench in shower?: Yes Elevated toilet seat or a handicapped toilet?: Yes  TIMED UP AND GO:  Was the test performed?  No    Cognitive Function: PT UNAVAILABLE AND PER SON, SHE COULDN'T PARTICIPATE     02/05/2021    1:02 PM 07/12/2017   11:29 AM  MMSE - Mini Mental State Exam  Orientation to time 1 5  Orientation to Place 5 5  Registration 3 3  Attention/ Calculation 4 4  Recall 2 3  Language- name 2 objects 2 2  Language- repeat 1 0  Language- follow 3 step command 3 3  Language- read & follow direction 1 1  Write a sentence 1 1  Copy design 0 0  Total score 23 27      08/05/2021   12:57 PM  Montreal Cognitive Assessment   Visuospatial/ Executive (0/5) 1  Naming (0/3) 0  Attention: Read list of digits (0/2) 0  Attention: Read list of letters (0/1) 1  Attention: Serial 7 subtraction starting at 100 (0/3) 0  Language: Repeat phrase (0/2) 1  Language : Fluency (0/1) 1  Abstraction (0/2) 2  Delayed Recall (0/5) 0  Orientation (0/6) 6  Total 12      10/21/2022   11:27 AM 11/14/2020    3:08 PM 04/13/2017    9:23 AM 07/09/2016    9:11 AM  6CIT Screen  What Year? 0 points 0 points 0 points 0 points  What month? 0 points 0 points 0 points 0 points  What time? 0 points 0 points 0 points 0 points  Count back from 20 0 points 0 points 0 points 0 points  Months in reverse 4 points 4 points 0 points 2 points  Repeat phrase 8 points 6 points 6 points 6 points  Total Score 12 points 10 points 6 points 8 points    Immunizations Immunization History  Administered Date(s) Administered   Fluad Quad(high Dose 65+) 02/21/2019, 04/21/2021, 05/14/2022, 01/26/2023   Influenza Split 04/08/2005, 02/20/2013   Influenza, High Dose Seasonal PF 02/27/2014, 03/04/2015, 04/13/2017, 05/16/2018   Influenza-Unspecified 03/18/2016, 03/09/2020   PFIZER(Purple Top)SARS-COV-2 Vaccination 04/17/2020   Pneumococcal Conjugate-13  01/24/2014   Pneumococcal Polysaccharide-23 04/18/2015   Td 03/07/1999   Tdap 03/30/2018   Zoster, Live 03/24/2012    TDAP status: Up to date  Flu Vaccine status: Up to date  Pneumococcal vaccine status: Due, Education has been provided regarding the importance of this vaccine. Advised Madonna receive this vaccine at local pharmacy or Health Dept. Aware to provide a copy of the vaccination record if obtained from local pharmacy or Health Dept. Verbalized acceptance and understanding.  Covid-19 vaccine status: Declined, Education has been provided regarding the importance of this vaccine but patient still declined. Advised Neyhart receive this vaccine at local pharmacy or Health Dept.or vaccine clinic. Aware to provide a copy of the vaccination record if obtained from local pharmacy or Health Dept. Verbalized acceptance and understanding.  Qualifies for Shingles Vaccine? Yes   Zostavax completed Yes   Shingrix Completed?: No.    Education has been provided regarding the importance of this vaccine. Patient has been advised  to call insurance company to determine out of pocket expense if they have not yet received this vaccine. Advised Kaminsky also receive vaccine at local pharmacy or Health Dept. Verbalized acceptance and understanding.  Screening Tests Health Maintenance  Topic Date Due   Colonoscopy  09/19/2023   COVID-19 Vaccine (2 - 2024-25 season) 02/07/2024 (Originally 02/07/2023)   Zoster Vaccines- Shingrix (1 of 2) 07/06/2024 (Originally 10/21/1985)   DEXA SCAN  06/08/2026 (Originally 10/21/2000)   INFLUENZA VACCINE  01/07/2024   Medicare Annual Wellness (AWV)  10/25/2024   DTaP/Tdap/Td (3 - Td or Tdap) 03/30/2028   Pneumonia Vaccine 33+ Years old  Completed   HPV VACCINES  Aged Out   Meningococcal B Vaccine  Aged Out    Health Maintenance  Health Maintenance Due  Topic Date Due   Colonoscopy  09/19/2023    Colorectal cancer screening: No longer required.   Mammogram status: No longer  required due to AGE.  Lung Cancer Screening: (Low Dose CT Chest recommended if Age 84-80 years, 20 pack-year currently smoking OR have quit w/in 15years.) does not qualify.    Additional Screening:  Hepatitis C Screening: does not qualify  Vision Screening: Recommended annual ophthalmology exams for early detection of glaucoma and other disorders of the eye. Is the patient up to date with their annual eye exam?  No  Who is the provider or what is the name of the office in which the patient attends annual eye exams? DINGELDEIN- WAS TOLD SHE NO LONGER NEEDED EYE EXAMS If pt is not established with a provider, would they like to be referred to a provider to establish care? No .   Dental Screening: Recommended annual dental exams for proper oral hygiene   Community Resource Referral / Chronic Care Management: CRR required this visit?  No   CCM required this visit?  No     Plan:     I have personally reviewed and noted the following in the patient's chart:   Medical and social history Use of alcohol, tobacco or illicit drugs  Current medications and supplements including opioid prescriptions. Patient is not currently taking opioid prescriptions. Functional ability and status Nutritional status Physical activity Advanced directives List of other physicians Hospitalizations, surgeries, and ER visits in previous 12 months Vitals Screenings to include cognitive, depression, and falls Referrals and appointments  In addition, I have reviewed and discussed with patient certain preventive protocols, quality metrics, and best practice recommendations. A written personalized care plan for preventive services as well as general preventive health recommendations were provided to patient.     Pinky Bright, LPN   [id number]   After Visit Summary: (MyChart) Due to this being a telephonic visit, the after visit summary with patients personalized plan was offered to patient via MyChart    Nurse Notes: ALZ. PATIENT- COULDN'T PARTICIPATE, SON DID HER QUESTIONNAIRE W/ ME

## 2024-01-05 ENCOUNTER — Encounter: Payer: Self-pay | Admitting: Family Medicine

## 2024-01-05 ENCOUNTER — Ambulatory Visit: Payer: Self-pay | Admitting: Family Medicine

## 2024-01-05 VITALS — BP 161/66 | HR 89 | Ht 63.0 in | Wt 163.1 lb

## 2024-01-05 DIAGNOSIS — I10 Essential (primary) hypertension: Secondary | ICD-10-CM

## 2024-01-05 DIAGNOSIS — J4530 Mild persistent asthma, uncomplicated: Secondary | ICD-10-CM

## 2024-01-05 DIAGNOSIS — F02A0 Dementia in other diseases classified elsewhere, mild, without behavioral disturbance, psychotic disturbance, mood disturbance, and anxiety: Secondary | ICD-10-CM | POA: Diagnosis not present

## 2024-01-05 DIAGNOSIS — F322 Major depressive disorder, single episode, severe without psychotic features: Secondary | ICD-10-CM

## 2024-01-05 DIAGNOSIS — F5104 Psychophysiologic insomnia: Secondary | ICD-10-CM

## 2024-01-05 DIAGNOSIS — G301 Alzheimer's disease with late onset: Secondary | ICD-10-CM

## 2024-01-05 DIAGNOSIS — F419 Anxiety disorder, unspecified: Secondary | ICD-10-CM | POA: Diagnosis not present

## 2024-01-05 MED ORDER — AMLODIPINE BESYLATE 10 MG PO TABS
10.0000 mg | ORAL_TABLET | Freq: Every day | ORAL | 1 refills | Status: AC
Start: 2024-01-05 — End: ?

## 2024-01-05 MED ORDER — ALBUTEROL SULFATE HFA 108 (90 BASE) MCG/ACT IN AERS: 2.0000 | INHALATION_SPRAY | Freq: Four times a day (QID) | RESPIRATORY_TRACT | 2 refills | Status: AC | PRN

## 2024-01-05 MED ORDER — FLUTICASONE-SALMETEROL 100-50 MCG/ACT IN AEPB: 1.0000 | INHALATION_SPRAY | Freq: Two times a day (BID) | RESPIRATORY_TRACT | 3 refills | Status: DC

## 2024-01-05 MED ORDER — ALPRAZOLAM 0.25 MG PO TABS: 0.2500 mg | ORAL_TABLET | Freq: Three times a day (TID) | ORAL | 5 refills | Status: DC | PRN

## 2024-01-05 MED ORDER — SERTRALINE HCL 50 MG PO TABS: 50.0000 mg | ORAL_TABLET | Freq: Every day | ORAL | 3 refills | Status: DC

## 2024-01-05 MED ORDER — SPACER/AERO-HOLDING CHAMBERS DEVI: 1.0000 | 1 refills | Status: AC

## 2024-01-05 NOTE — Patient Instructions (Addendum)
 Reduce your fluid intake.  Do not exceed 64 oz (8 cups) per day.  Increase physical activity by taking walks in your neighborhood - start with one walk per day, then consider adding a second walk as you start feeling better.  Consider using an incentive spirometer to improve breathing.  Try to avoid sitting for too long to prevent development of wounds on your bottom.  Use zinc  oxide (40%) for the diaper rash.

## 2024-01-05 NOTE — Progress Notes (Signed)
 Established patient visit   Patient: Jordan Gordon   DOB: May 22, 1934   89 y.o. Female  MRN: [id number] Visit Date: 01/05/2024  Today's healthcare provider: LAURAINE LOISE BUOY, DO   Chief Complaint  Patient presents with   Follow-up    6 month    Subjective    HPI Jordan Gordon is an 89 year old female who presents with increased irritability and anxiety.  She has been experiencing increased irritability and anger, which has worsened recently. Her family, who are her caregivers, have increased her hired sitter hours to 12 hours a day to help manage her care. She sometimes wears her hearing aids and glasses, but not consistently. She is currently taking Xanax  three times a day, once in the morning, after lunch, and at night. She was previously taking it twice a day but increased to three times due to increased irritability. She also takes sertraline  daily in the morning.  She has gained 10-11 pounds and eats well, though she sometimes gets up at night to eat. Her diet includes Pepsi, milk, cranberry juice, and orange juice. She is experiencing anxiety due to her nephew's impending death and her son's participation in ranger school in [place] .  She has a history of high blood pressure and was prescribed metoprolol  at the hospital after a fall and pneumonia. However, home health care advised discontinuing it, and her blood pressure has been monitored at home with a cuff, though not frequently since home health care stopped visiting.  She has no exercise routine and prefers to sit in a chair, showing no desire to walk or engage in physical activity. She experiences shortness of breath when walking and sometimes wheezes. She has used an inhaler in the past, which helped her during recovery from a hospital stay.  She urinates frequently, approximately one to two times per hour, and sometimes has accidents. She drinks a lot of fluids, including water, Pepsi, cranberry juice, orange juice, and milk,  likely contributing to her frequent urination.  She has some redness and soreness on her bottom, which her caregiver is treating with diaper rash cream.         Medications: Outpatient Medications Prior to Visit  Medication Sig Note   aspirin  EC 81 MG tablet Take 81 mg by mouth daily. Swallow whole.    donepezil  (ARICEPT ) 10 MG tablet Take 1 tablet (10 mg total) by mouth at bedtime.    losartan  (COZAAR ) 100 MG tablet Take 1 tablet (100 mg total) by mouth daily.    oxybutynin  (DITROPAN -XL) 5 MG 24 hr tablet Take 1 tablet (5 mg total) by mouth at bedtime.    sertraline  (ZOLOFT ) 100 MG tablet Take 1 tablet (100 mg total) by mouth daily.    traZODone  (DESYREL ) 150 MG tablet Take 1 tablet (150 mg total) by mouth at bedtime.    [DISCONTINUED] ALPRAZolam  (XANAX ) 0.25 MG tablet Take 1 tablet (0.25 mg total) by mouth 3 (three) times daily as needed for anxiety.    [DISCONTINUED] amLODipine  (NORVASC ) 10 MG tablet Take 1 tablet (10 mg total) by mouth daily. Skip the dose if systolic BP less than 140 mmHg    [DISCONTINUED] Dextromethorphan -guaiFENesin  (ROBAFEN DM) 20-200 MG/20ML LIQD Take 20 mLs by mouth every 8 (eight) hours as needed. (Patient not taking: Reported on 10/26/2023)    [DISCONTINUED] metoprolol  tartrate (LOPRESSOR ) 50 MG tablet Take 1 tablet (50 mg total) by mouth 2 (two) times daily. 01/05/2024: DC'd with home health   No  facility-administered medications prior to visit.        Objective    BP (!) 161/66 (BP Location: Left Arm, Patient Position: Sitting, Cuff Size: Normal)   Pulse 89   Ht 5' 3 (1.6 m)   Wt 163 lb 1.6 oz (74 kg)   SpO2 96%   BMI 28.89 kg/m     Physical Exam Vitals and nursing note reviewed.  Constitutional:      General: She is not in acute distress.    Appearance: Normal appearance.  HENT:     Head: Normocephalic and atraumatic.  Eyes:     General: No scleral icterus.    Conjunctiva/sclera: Conjunctivae normal.  Cardiovascular:     Rate and  Rhythm: Normal rate.  Pulmonary:     Effort: Pulmonary effort is normal.  Skin:    Comments: small lump/irritation to right side of buttock (uppermost) redness to perineum/moist  Neurological:     Mental Status: She is alert and oriented to person, place, and time. Mental status is at baseline.  Psychiatric:        Mood and Affect: Mood normal.        Behavior: Behavior normal.      No results found for any visits on 01/05/24.  Assessment & Plan    Moderately severe major depression (HCC) -     Sertraline  HCl; Take 1 tablet (50 mg total) by mouth daily. Take with sertraline  100 mg daily (total 150 mg daily)  Dispense: 30 tablet; Refill: 3  Anxiety -     ALPRAZolam ; Take 1 tablet (0.25 mg total) by mouth 3 (three) times daily as needed for anxiety.  Dispense: 90 tablet; Refill: 5  Mild late onset Alzheimer's dementia without behavioral disturbance, psychotic disturbance, mood disturbance, or anxiety (HCC) -     ALPRAZolam ; Take 1 tablet (0.25 mg total) by mouth 3 (three) times daily as needed for anxiety.  Dispense: 90 tablet; Refill: 5  Psychophysiological insomnia -     ALPRAZolam ; Take 1 tablet (0.25 mg total) by mouth 3 (three) times daily as needed for anxiety.  Dispense: 90 tablet; Refill: 5  Essential (primary) hypertension -     amLODIPine  Besylate; Take 1 tablet (10 mg total) by mouth daily. Skip the dose if systolic BP less than 140 mmHg  Dispense: 90 tablet; Refill: 1  Mild persistent asthma without complication -     Fluticasone -Salmeterol; Inhale 1 puff into the lungs 2 (two) times daily.  Dispense: 1 each; Refill: 3 -     Albuterol  Sulfate HFA; Inhale 2 puffs into the lungs every 6 (six) hours as needed for wheezing or shortness of breath.  Dispense: 8 g; Refill: 2 -     Spacer/Aero-Holding Chambers; 1 each by Does not apply route as directed. For use with inhalers  Dispense: 1 each; Refill: 1      Mild, late onset Alzheimer's dementia; psychophysiologic insomnia;  major severe depression Increased irritability and anxiety, possibly exacerbated by recent family stressors. Discussed risks of increasing Xanax  due to age-related decline in kidney and liver function. Considered increasing sertraline  as a safer alternative. - Increase sertraline  dosage. - Continue Xanax  as currently prescribed. - Continue donepezil  and trazodone .  Urinary frequency and incontinence Frequent urination possibly due to high fluid intake. Occasional incontinence reported. - Reduce fluid intake, especially Pepsi. - Continue oxybutynin .  Asthma, mild persistent, without exacerbation Intermittent asthma with wheezing during exertion. Previous use of albuterol  inhaler was beneficial. Insurance coverage issues with previous inhalers noted. -  Prescribe daily inhaler for morning and night use. - Prescribe as-needed inhaler for symptom relief. - Instruct on use of incentive spirometer to improve lung function.  Essential hypertension Hypertension with previous metoprolol  use. Blood pressure monitoring inconsistent since home health care ended. - Continue losartan  and amlodipine . - Encourage regular blood pressure monitoring at home. - Bring blood pressure cuff to clinic for calibration.  Deconditioning and reduced mobility Reduced mobility and deconditioning due to lack of exercise. Shortness of breath noted with exertion. - Encourage daily walking, starting with one trip down the road and back. - Use incentive spirometer to improve respiratory function. - Encourage gradual increase in physical activity.  Perineal skin irritation and lump Perineal skin irritation with a small lump, possibly due to prolonged sitting. No significant infection noted. - Apply 40% zinc  oxide cream to affected area. - Keep area clean and dry. - Encourage sitz baths or warm compresses to alleviate discomfort. - Encourage reducing prolonged sitting periods.    Return in about 6 months (around  07/07/2024).      I discussed the assessment and treatment plan with the patient  The patient was provided an opportunity to ask questions and all were answered. The patient agreed with the plan and demonstrated an understanding of the instructions.   The patient was advised to call back or seek an in-person evaluation if the symptoms worsen or if the condition fails to improve as anticipated.    LAURAINE LOISE BUOY, DO  [facility] [phone] (phone) [phone] (fax)  [facility]

## 2024-03-27 ENCOUNTER — Encounter: Payer: Self-pay | Admitting: Family Medicine

## 2024-03-27 ENCOUNTER — Ambulatory Visit (INDEPENDENT_AMBULATORY_CARE_PROVIDER_SITE_OTHER): Admitting: Family Medicine

## 2024-03-27 VITALS — BP 155/76 | HR 88 | Temp 98.2°F | Ht 63.0 in | Wt 150.0 lb

## 2024-03-27 DIAGNOSIS — N3000 Acute cystitis without hematuria: Secondary | ICD-10-CM

## 2024-03-27 DIAGNOSIS — I10 Essential (primary) hypertension: Secondary | ICD-10-CM

## 2024-03-27 DIAGNOSIS — R300 Dysuria: Secondary | ICD-10-CM | POA: Diagnosis not present

## 2024-03-27 DIAGNOSIS — J4530 Mild persistent asthma, uncomplicated: Secondary | ICD-10-CM | POA: Diagnosis not present

## 2024-03-27 LAB — POCT URINALYSIS DIPSTICK
Bilirubin, UA: NEGATIVE
Blood, UA: NEGATIVE
Glucose, UA: NEGATIVE
Ketones, UA: NEGATIVE
Nitrite, UA: NEGATIVE
Protein, UA: POSITIVE — AB
Spec Grav, UA: 1.02 (ref 1.010–1.025)
Urobilinogen, UA: 0.2 U/dL
pH, UA: 6 (ref 5.0–8.0)

## 2024-03-27 MED ORDER — LOSARTAN POTASSIUM 100 MG PO TABS: 100.0000 mg | ORAL_TABLET | Freq: Every day | ORAL | 3 refills | Status: AC

## 2024-03-27 MED ORDER — CEPHALEXIN 500 MG PO CAPS: 500.0000 mg | ORAL_CAPSULE | Freq: Two times a day (BID) | ORAL | 0 refills | Status: AC

## 2024-03-27 NOTE — Assessment & Plan Note (Signed)
 Mild tachypnea and expiratory wheezing on exam. Advised caregiver to increase use of Advair (pt has not received in past few days at least). - use Advair daily as directed. - pick up refill of Advair from pharmacy if needed.

## 2024-03-27 NOTE — Progress Notes (Signed)
 Established patient visit   Patient: Jordan Gordon   DOB: 04-25-1934   89 y.o. Female  MRN: [id number] Visit Date: 03/27/2024  Today's healthcare provider: LAURAINE LOISE BUOY, DO   Chief Complaint  Patient presents with   Urinary Tract Infection    Patient is here for a possible UTI, urinary frequency.  Last week not while urinating but lower pelvic pain reports no pain today.  More confused than what she normally is.    Still getting a little combative.  Also a cough.   Subjective    Urinary Tract Infection  Associated symptoms include frequency. Pertinent negatives include no chills or hematuria.    Jordan Gordon is an 89 year old female who presents with urinary symptoms suggestive of a urinary tract infection.  She experiences frequent urination and occasional incontinence. Her caregiver notes frequent urination. No fever or chills are reported.   She has a history of hypertension and is currently on amlodipine . There is confusion regarding her use of losartan , as it was last filled in June and should have run out by September. Her caregiver is unsure if she is still taking it, as the patient's son (who did not accompany her today) is her primary caregiver. Her blood pressure has been running a little high according to home measurements.  She has a history of macular degeneration, which is noted to be genetic.  She has been experiencing some wheezing and has an inhaler (Advair) that she uses as needed, although it has not been used consistently in the past few days. She has been coughing over the past week, but not excessively.  Regarding vaccinations, she received a flu shot last week from [facility] but is unsure if she received a COVID vaccine at that visit.      Medications: Outpatient Medications Prior to Visit  Medication Sig   albuterol  (VENTOLIN  HFA) 108 (90 Base) MCG/ACT inhaler Inhale 2 puffs into the lungs every 6 (six) hours as needed for wheezing or shortness  of breath.   ALPRAZolam  (XANAX ) 0.25 MG tablet Take 1 tablet (0.25 mg total) by mouth 3 (three) times daily as needed for anxiety.   amLODipine  (NORVASC ) 10 MG tablet Take 1 tablet (10 mg total) by mouth daily. Skip the dose if systolic BP less than 140 mmHg   aspirin  EC 81 MG tablet Take 81 mg by mouth daily. Swallow whole.   donepezil  (ARICEPT ) 10 MG tablet Take 1 tablet (10 mg total) by mouth at bedtime.   fluticasone -salmeterol (ADVAIR) 100-50 MCG/ACT AEPB Inhale 1 puff into the lungs 2 (two) times daily. (Patient taking differently: Inhale 1 puff into the lungs as needed.)   oxybutynin  (DITROPAN -XL) 5 MG 24 hr tablet Take 1 tablet (5 mg total) by mouth at bedtime.   sertraline  (ZOLOFT ) 100 MG tablet Take 1 tablet (100 mg total) by mouth daily.   sertraline  (ZOLOFT ) 50 MG tablet Take 1 tablet (50 mg total) by mouth daily. Take with sertraline  100 mg daily (total 150 mg daily)   Spacer/Aero-Holding Raguel DEVI 1 each by Does not apply route as directed. For use with inhalers   traZODone  (DESYREL ) 150 MG tablet Take 1 tablet (150 mg total) by mouth at bedtime.   [DISCONTINUED] losartan  (COZAAR ) 100 MG tablet Take 1 tablet (100 mg total) by mouth daily.   No facility-administered medications prior to visit.    Review of Systems  Constitutional:  Negative for chills and fever.  Respiratory:  Positive  for cough (dry).   Gastrointestinal:  Positive for abdominal pain (suprapubic). Negative for constipation and diarrhea.  Genitourinary:  Positive for decreased urine volume, dysuria and frequency. Negative for difficulty urinating and hematuria.  Psychiatric/Behavioral:  Positive for agitation and confusion (increased relative to baseline).         Objective    BP (!) 155/76 (BP Location: Left Arm, Patient Position: Sitting, Cuff Size: Normal)   Pulse 88   Temp 98.2 F (36.8 C) (Oral)   Ht 5' 3 (1.6 m)   Wt 150 lb (68 kg)   SpO2 97%   BMI 26.57 kg/m     Physical  Exam Constitutional:      Appearance: Normal appearance.  HENT:     Head: Normocephalic and atraumatic.  Eyes:     General: No scleral icterus.    Extraocular Movements: Extraocular movements intact.     Conjunctiva/sclera: Conjunctivae normal.  Cardiovascular:     Rate and Rhythm: Normal rate and regular rhythm.     Pulses: Normal pulses.     Heart sounds: Normal heart sounds.  Pulmonary:     Effort: Pulmonary effort is normal. Tachypnea (mild) present. No respiratory distress.     Breath sounds: Wheezing present. No decreased breath sounds, rhonchi or rales.  Abdominal:     General: Bowel sounds are normal. There is no distension.     Palpations: Abdomen is soft. There is no mass.     Tenderness: There is no abdominal tenderness. There is no right CVA tenderness, left CVA tenderness or guarding.  Musculoskeletal:     Right lower leg: No edema.     Left lower leg: No edema.  Skin:    General: Skin is warm and dry.  Neurological:     Mental Status: She is alert and oriented to person, place, and time. Mental status is at baseline.  Psychiatric:        Mood and Affect: Mood normal.        Behavior: Behavior normal.      Results for orders placed or performed in visit on 03/27/24  POCT Urinalysis Dipstick  Result Value Ref Range   Color, UA Yellow    Clarity, UA Cloudy    Glucose, UA Negative Negative   Bilirubin, UA Negative    Ketones, UA Negative    Spec Grav, UA 1.020 1.010 - 1.025   Blood, UA Negative    pH, UA 6.0 5.0 - 8.0   Protein, UA Positive (A) Negative   Urobilinogen, UA 0.2 0.2 or 1.0 E.U./dL   Nitrite, UA Negative    Leukocytes, UA Moderate (2+) (A) Negative   Appearance     Odor       Assessment & Plan    Dysuria -     POCT urinalysis dipstick  Acute cystitis without hematuria -     Urine Culture -     Cephalexin ; Take 1 capsule (500 mg total) by mouth 2 (two) times daily for 5 days.  Dispense: 10 capsule; Refill: 0  Essential (primary)  hypertension -     Losartan  Potassium; Take 1 tablet (100 mg total) by mouth daily.  Dispense: 90 tablet; Refill: 3  Mild persistent asthma without complication Assessment & Plan: Mild tachypnea and expiratory wheezing on exam. Advised caregiver to increase use of Advair (pt has not received in past few days at least). - use Advair daily as directed. - pick up refill of Advair from pharmacy if needed.  Dysuria; acute cystitis without hematuria Suspected UTI due to increased urinary frequency, agitation and confusion, along with presence of moderate leukocytes on POC urinalysis; no nitrites noted.  Calculated creatinine clearance 33 milliliters per minute. - Prescribed Cefalexin twice daily for 5 days. - Sent urine for culture to determine antibiotic resistance.  Essential (primary) hypertension Blood pressure elevated, possibly due to missed losartan  refills. Home checks confirm high readings. - Refilled losartan , start with half a tablet for one week. - Monitor blood pressure at home.  Mild persistent asthma without complication Wheezing noted, inconsistent use of Advair inhaler. - Restart Advair inhaler as directed, taking 1 puff twice daily. - Ensure Advair refills are available.  General Health Maintenance Flu vaccine received. COVID vaccine status unclear. - Confirm COVID vaccine status with her son.     Return in about 3 months (around 07/04/2024), or if symptoms worsen or fail to improve, for HTN, Chronic f/u (as already scheduled).      I discussed the assessment and treatment plan with the patient  The patient was provided an opportunity to ask questions and all were answered. The patient agreed with the plan and demonstrated an understanding of the instructions.   The patient was advised to call back or seek an in-person evaluation if the symptoms worsen or if the condition fails to improve as anticipated.    LAURAINE LOISE BUOY, DO  [facility] [phone] (phone) [phone] (fax)  [facility]

## 2024-03-31 ENCOUNTER — Ambulatory Visit: Payer: Self-pay | Admitting: Family Medicine

## 2024-03-31 LAB — SPECIMEN STATUS REPORT

## 2024-03-31 LAB — URINE CULTURE

## 2024-06-07 ENCOUNTER — Other Ambulatory Visit: Payer: Self-pay | Admitting: Family Medicine

## 2024-06-07 DIAGNOSIS — F322 Major depressive disorder, single episode, severe without psychotic features: Secondary | ICD-10-CM

## 2024-06-09 NOTE — Telephone Encounter (Signed)
 Requested Prescriptions  Pending Prescriptions Disp Refills   sertraline  (ZOLOFT ) 50 MG tablet [Pharmacy Med Name: SERTRALINE  HCL 50 MG TAB] 90 tablet 0    Sig: TAKE ONE TABLET BY MOUTH ONCE DAILY TAKEWITH SERTRALINE  100MG  DAILY FOR TOTAL OF150MG      Psychiatry:  Antidepressants - SSRI - sertraline  Failed - 06/09/2024  8:50 AM      Failed - AST in normal range and within 360 days    AST  Date Value Ref Range Status  08/02/2023 47 (H) 15 - 41 U/L Final   SGOT(AST)  Date Value Ref Range Status  01/12/2013 29 15 - 37 Unit/L Final         Passed - ALT in normal range and within 360 days    ALT  Date Value Ref Range Status  08/02/2023 26 0 - 44 U/L Final   SGPT (ALT)  Date Value Ref Range Status  01/12/2013 35 12 - 78 U/L Final         Passed - Completed PHQ-2 or PHQ-9 in the last 360 days      Passed - Valid encounter within last 6 months    Recent Outpatient Visits           2 months ago Dysuria   [facility] [place], Lauraine SAILOR, DO   5 months ago Moderately severe major depression [facility])   [facility] [place], Lauraine SAILOR, DO   9 months ago Hospital discharge follow-up   [facility] [place], Lauraine SAILOR, DO

## 2024-06-29 ENCOUNTER — Other Ambulatory Visit: Payer: Self-pay | Admitting: Family Medicine

## 2024-06-29 DIAGNOSIS — J4530 Mild persistent asthma, uncomplicated: Secondary | ICD-10-CM

## 2024-07-04 ENCOUNTER — Ambulatory Visit: Admitting: Family Medicine

## 2024-07-07 ENCOUNTER — Other Ambulatory Visit: Payer: Self-pay | Admitting: Family Medicine

## 2024-07-07 DIAGNOSIS — F02A0 Dementia in other diseases classified elsewhere, mild, without behavioral disturbance, psychotic disturbance, mood disturbance, and anxiety: Secondary | ICD-10-CM

## 2024-07-12 ENCOUNTER — Ambulatory Visit: Admitting: Family Medicine

## 2024-07-12 ENCOUNTER — Encounter: Payer: Self-pay | Admitting: Family Medicine

## 2024-07-12 VITALS — BP 162/72 | HR 82 | Temp 98.6°F | Ht 63.0 in | Wt 153.3 lb

## 2024-07-12 DIAGNOSIS — I10 Essential (primary) hypertension: Secondary | ICD-10-CM

## 2024-07-12 DIAGNOSIS — E538 Deficiency of other specified B group vitamins: Secondary | ICD-10-CM

## 2024-07-12 DIAGNOSIS — J4530 Mild persistent asthma, uncomplicated: Secondary | ICD-10-CM

## 2024-07-12 DIAGNOSIS — F322 Major depressive disorder, single episode, severe without psychotic features: Secondary | ICD-10-CM

## 2024-07-12 DIAGNOSIS — E559 Vitamin D deficiency, unspecified: Secondary | ICD-10-CM

## 2024-07-12 DIAGNOSIS — F5104 Psychophysiologic insomnia: Secondary | ICD-10-CM

## 2024-07-12 DIAGNOSIS — F419 Anxiety disorder, unspecified: Secondary | ICD-10-CM

## 2024-07-12 DIAGNOSIS — G301 Alzheimer's disease with late onset: Secondary | ICD-10-CM

## 2024-07-12 MED ORDER — ALPRAZOLAM 0.25 MG PO TABS: 0.2500 mg | ORAL_TABLET | Freq: Three times a day (TID) | ORAL | 5 refills | Status: AC | PRN

## 2024-07-12 MED ORDER — SERTRALINE HCL 200 MG PO CAPS: 1.0000 | ORAL_CAPSULE | Freq: Every day | ORAL | 1 refills | Status: AC

## 2024-07-12 NOTE — Progress Notes (Unsigned)
 "     Established patient visit   Patient: Jordan Gordon   DOB: 03/01/1934   89 y.o. Female  MRN: [id number] Visit Date: 07/12/2024  Today's healthcare provider: LAURAINE LOISE BUOY, DO   Chief Complaint  Patient presents with   Hypertension    Patient is here for a follow up on hypertension, states that her BP has been monitored at home it has been elevated and tends to remain that way.     Medical Management of Chronic Issues    Reports that the UTI was treated and not having any other issues with it.  States that she stays depressed and sad all the time.   Subjective    Hypertension Pertinent negatives include no chest pain, palpitations or shortness of breath.    DEPRESSION - consider citalopram  or brexpiprazole   ***  {History (Optional):23778}  Medications: Show/hide medication list[1]  Review of Systems  Respiratory:  Negative for chest tightness and shortness of breath.   Cardiovascular:  Negative for chest pain and palpitations.  Gastrointestinal:  Negative for abdominal pain, nausea and vomiting.  Psychiatric/Behavioral:  Positive for agitation and confusion.     {Insert previous labs (optional):23779} {See past labs  Heme  Chem  Endocrine  Serology  Results Review (optional):1}   Objective    BP (!) 170/69 (BP Location: Left Arm, Cuff Size: Large)   Pulse 82   Temp 98.6 F (37 C) (Oral)   Ht 5' 3 (1.6 m)   Wt 153 lb 4.8 oz (69.5 kg)   SpO2 99%   BMI 27.16 kg/m  {Insert last BP/Wt (optional):23777}{See vitals history (optional):1}   Physical Exam Constitutional:      Appearance: Normal appearance.  HENT:     Head: Normocephalic and atraumatic.  Eyes:     General: No scleral icterus.    Extraocular Movements: Extraocular movements intact.     Conjunctiva/sclera: Conjunctivae normal.  Cardiovascular:     Rate and Rhythm: Normal rate and regular rhythm.     Pulses: Normal pulses.     Heart sounds: Normal heart sounds.  Pulmonary:     Effort:  Pulmonary effort is normal. No respiratory distress.     Breath sounds: Normal breath sounds.  Abdominal:     General: Bowel sounds are normal.     Palpations: Abdomen is soft.  Musculoskeletal:     Right lower leg: No edema.     Left lower leg: No edema.  Skin:    General: Skin is warm and dry.  Neurological:     Mental Status: She is alert and oriented to person, place, and time. Mental status is at baseline.  Psychiatric:        Mood and Affect: Mood normal.        Behavior: Behavior normal.      No results found for any visits on 07/12/24.  Assessment & Plan    Essential (primary) hypertension  Mild persistent asthma without complication  Mild late onset Alzheimer's dementia without behavioral disturbance, psychotic disturbance, mood disturbance, or anxiety (HCC)  Moderately severe major depression (HCC)  Psychophysiological insomnia  Anxiety    ***  No follow-ups on file.      I discussed the assessment and treatment plan with the patient  The patient was provided an opportunity to ask questions and all were answered. The patient agreed with the plan and demonstrated an understanding of the instructions.   The patient was advised to call back or seek an in-person  evaluation if the symptoms worsen or if the condition fails to improve as anticipated.    LAURAINE LOISE BUOY, DO  [facility] [phone] (phone) [phone] (fax)  [facility]    [1]  Outpatient Medications Prior to Visit  Medication Sig   albuterol  (VENTOLIN  HFA) 108 (90 Base) MCG/ACT inhaler Inhale 2 puffs into the lungs every 6 (six) hours as needed for wheezing or shortness of breath.   ALPRAZolam  (XANAX ) 0.25 MG tablet Take 1 tablet (0.25 mg total) by mouth 3 (three) times daily as needed for anxiety.   amLODipine  (NORVASC ) 10 MG tablet Take 1 tablet (10 mg total) by mouth daily. Skip the dose if systolic BP less than 140 mmHg   aspirin  EC 81 MG  tablet Take 81 mg by mouth daily. Swallow whole.   donepezil  (ARICEPT ) 10 MG tablet TAKE 1 TABLET BY MOUTH AT BEDTIME   fluticasone -salmeterol (ADVAIR) 100-50 MCG/ACT AEPB INHALE 1 PUFF INTO THE LUNGS TWICE DAILY   losartan  (COZAAR ) 100 MG tablet Take 1 tablet (100 mg total) by mouth daily.   oxybutynin  (DITROPAN -XL) 5 MG 24 hr tablet Take 1 tablet (5 mg total) by mouth at bedtime.   sertraline  (ZOLOFT ) 100 MG tablet Take 1 tablet (100 mg total) by mouth daily.   sertraline  (ZOLOFT ) 50 MG tablet TAKE ONE TABLET BY MOUTH ONCE DAILY TAKEWITH SERTRALINE  100MG  DAILY FOR TOTAL OF150MG    Spacer/Aero-Holding Chambers DEVI 1 each by Does not apply route as directed. For use with inhalers   traZODone  (DESYREL ) 150 MG tablet Take 1 tablet (150 mg total) by mouth at bedtime.   No facility-administered medications prior to visit.   "

## 2024-07-12 NOTE — Assessment & Plan Note (Addendum)
 Chronic. Does well overall. Lives at home alone with frequent visits/care from her sons and a caregiver. Medication managed by her sons.

## 2024-07-12 NOTE — Assessment & Plan Note (Addendum)
 Chronic, reports sleeping well at this point. Does get up to urinate each night.  Does sometimes take daytime naps still but this has improved. Currently managed with trazodone  150 mg nightly. No changes today.

## 2024-07-13 LAB — COMPREHENSIVE METABOLIC PANEL WITH GFR
ALT: 13 [IU]/L (ref 0–32)
AST: 19 [IU]/L (ref 0–40)
Albumin: 4.2 g/dL (ref 3.7–4.7)
Alkaline Phosphatase: 96 [IU]/L (ref 48–129)
BUN/Creatinine Ratio: 11 — ABNORMAL LOW (ref 12–28)
BUN: 15 mg/dL (ref 8–27)
Bilirubin Total: 0.4 mg/dL (ref 0.0–1.2)
CO2: 22 mmol/L (ref 20–29)
Calcium: 8.8 mg/dL (ref 8.7–10.3)
Chloride: 104 mmol/L (ref 96–106)
Creatinine, Ser: 1.31 mg/dL — ABNORMAL HIGH (ref 0.57–1.00)
Globulin, Total: 2.4 g/dL (ref 1.5–4.5)
Glucose: 93 mg/dL (ref 70–99)
Potassium: 3.4 mmol/L — ABNORMAL LOW (ref 3.5–5.2)
Sodium: 145 mmol/L — ABNORMAL HIGH (ref 134–144)
Total Protein: 6.6 g/dL (ref 6.0–8.5)
eGFR: 39 mL/min/{1.73_m2} — ABNORMAL LOW

## 2024-07-13 LAB — VITAMIN B12: Vitamin B-12: 496 pg/mL (ref 232–1245)

## 2024-07-13 LAB — VITAMIN D 25 HYDROXY (VIT D DEFICIENCY, FRACTURES): Vit D, 25-Hydroxy: 13 ng/mL — ABNORMAL LOW (ref 30.0–100.0)

## 2024-08-23 ENCOUNTER — Ambulatory Visit: Admitting: Family Medicine

## 2024-10-18 ENCOUNTER — Ambulatory Visit

## 2024-11-01 ENCOUNTER — Ambulatory Visit
